# Patient Record
Sex: Female | Born: 1935 | Race: White | Hispanic: No | Smoking: Never smoker
Health system: Southern US, Community
[De-identification: ages and names within clinical notes are randomized; demographics above are authoritative.]

## PROBLEM LIST (undated history)

## (undated) DIAGNOSIS — K219 Gastro-esophageal reflux disease without esophagitis: Secondary | ICD-10-CM

## (undated) DIAGNOSIS — M1990 Unspecified osteoarthritis, unspecified site: Secondary | ICD-10-CM

## (undated) DIAGNOSIS — R0600 Dyspnea, unspecified: Secondary | ICD-10-CM

## (undated) DIAGNOSIS — Z950 Presence of cardiac pacemaker: Secondary | ICD-10-CM

## (undated) DIAGNOSIS — I10 Essential (primary) hypertension: Secondary | ICD-10-CM

## (undated) DIAGNOSIS — I080 Rheumatic disorders of both mitral and aortic valves: Secondary | ICD-10-CM

## (undated) DIAGNOSIS — E079 Disorder of thyroid, unspecified: Secondary | ICD-10-CM

## (undated) DIAGNOSIS — I4510 Unspecified right bundle-branch block: Secondary | ICD-10-CM

## (undated) HISTORY — DX: Disorder of thyroid, unspecified: E07.9

## (undated) HISTORY — PX: TONSILLECTOMY AND ADENOIDECTOMY: SUR1326

## (undated) HISTORY — PX: VAGINAL HYSTERECTOMY: SUR661

## (undated) HISTORY — DX: Gastro-esophageal reflux disease without esophagitis: K21.9

## (undated) HISTORY — DX: Essential (primary) hypertension: I10

## (undated) HISTORY — PX: KNEE SURGERY: SHX244

## (undated) HISTORY — PX: OTHER SURGICAL HISTORY: SHX169

## (undated) HISTORY — DX: Rheumatic disorders of both mitral and aortic valves: I08.0

## (undated) HISTORY — PX: EYE SURGERY: SHX253

## (undated) HISTORY — DX: Unspecified right bundle-branch block: I45.10

## (undated) HISTORY — DX: Unspecified osteoarthritis, unspecified site: M19.90

## (undated) HISTORY — DX: Dyspnea, unspecified: R06.00

---

## 1998-05-27 ENCOUNTER — Emergency Department (HOSPITAL_COMMUNITY): Admission: EM | Admit: 1998-05-27 | Discharge: 1998-05-27 | Payer: Self-pay | Admitting: Pediatrics

## 1999-08-19 ENCOUNTER — Other Ambulatory Visit: Admission: RE | Admit: 1999-08-19 | Discharge: 1999-08-19 | Payer: Self-pay | Admitting: Obstetrics and Gynecology

## 2000-08-24 ENCOUNTER — Other Ambulatory Visit: Admission: RE | Admit: 2000-08-24 | Discharge: 2000-08-24 | Payer: Self-pay | Admitting: Obstetrics and Gynecology

## 2000-12-18 ENCOUNTER — Encounter: Payer: Self-pay | Admitting: Internal Medicine

## 2000-12-18 ENCOUNTER — Encounter: Admission: RE | Admit: 2000-12-18 | Discharge: 2000-12-18 | Payer: Self-pay | Admitting: Internal Medicine

## 2001-08-27 ENCOUNTER — Other Ambulatory Visit: Admission: RE | Admit: 2001-08-27 | Discharge: 2001-08-27 | Payer: Self-pay | Admitting: Obstetrics and Gynecology

## 2004-01-18 ENCOUNTER — Ambulatory Visit (HOSPITAL_COMMUNITY): Admission: RE | Admit: 2004-01-18 | Discharge: 2004-01-18 | Payer: Self-pay | Admitting: *Deleted

## 2007-01-06 ENCOUNTER — Inpatient Hospital Stay (HOSPITAL_COMMUNITY): Admission: RE | Admit: 2007-01-06 | Discharge: 2007-01-09 | Payer: Self-pay | Admitting: Orthopedic Surgery

## 2008-07-28 ENCOUNTER — Encounter: Admission: RE | Admit: 2008-07-28 | Discharge: 2008-07-28 | Payer: Self-pay | Admitting: Orthopedic Surgery

## 2010-09-11 ENCOUNTER — Encounter
Admission: RE | Admit: 2010-09-11 | Discharge: 2010-09-11 | Payer: Self-pay | Source: Home / Self Care | Admitting: Orthopedic Surgery

## 2011-02-05 ENCOUNTER — Emergency Department (HOSPITAL_COMMUNITY): Payer: Medicare Other

## 2011-02-05 ENCOUNTER — Emergency Department (HOSPITAL_COMMUNITY)
Admission: EM | Admit: 2011-02-05 | Discharge: 2011-02-05 | Disposition: A | Discharge status: Home / Self Care | Payer: Medicare Other | Attending: Emergency Medicine | Admitting: Emergency Medicine

## 2011-02-05 DIAGNOSIS — S0083XA Contusion of other part of head, initial encounter: Secondary | ICD-10-CM | POA: Insufficient documentation

## 2011-02-05 DIAGNOSIS — M899 Disorder of bone, unspecified: Secondary | ICD-10-CM | POA: Insufficient documentation

## 2011-02-05 DIAGNOSIS — S0003XA Contusion of scalp, initial encounter: Secondary | ICD-10-CM | POA: Insufficient documentation

## 2011-02-05 DIAGNOSIS — W19XXXA Unspecified fall, initial encounter: Secondary | ICD-10-CM | POA: Insufficient documentation

## 2011-02-05 DIAGNOSIS — Y92009 Unspecified place in unspecified non-institutional (private) residence as the place of occurrence of the external cause: Secondary | ICD-10-CM | POA: Insufficient documentation

## 2011-02-05 DIAGNOSIS — I10 Essential (primary) hypertension: Secondary | ICD-10-CM | POA: Insufficient documentation

## 2011-02-05 DIAGNOSIS — IMO0002 Reserved for concepts with insufficient information to code with codable children: Secondary | ICD-10-CM | POA: Insufficient documentation

## 2011-02-05 DIAGNOSIS — R221 Localized swelling, mass and lump, neck: Secondary | ICD-10-CM | POA: Insufficient documentation

## 2011-02-05 DIAGNOSIS — R220 Localized swelling, mass and lump, head: Secondary | ICD-10-CM | POA: Insufficient documentation

## 2011-02-05 DIAGNOSIS — S62109A Fracture of unspecified carpal bone, unspecified wrist, initial encounter for closed fracture: Secondary | ICD-10-CM | POA: Insufficient documentation

## 2011-02-05 DIAGNOSIS — E7800 Pure hypercholesterolemia, unspecified: Secondary | ICD-10-CM | POA: Insufficient documentation

## 2011-02-21 NOTE — Op Note (Signed)
NAME:  [name], [name]                       ACCOUNT NO.:  [id number]   MEDICAL RECORD NO.:  [id number]                   PATIENT TYPE:  AMB   LOCATION:  ENDO                                 FACILITY:  [facility]   PHYSICIAN:  Georgiana Spinner, M.D.                 DATE OF BIRTH:  Dec 29, 1930   DATE OF PROCEDURE:  01/18/2004  DATE OF DISCHARGE:                                 OPERATIVE REPORT   PROCEDURE:  Colonoscopy.   INDICATIONS FOR PROCEDURE:  Rectal bleeding.   ANESTHESIA:  Demerol 50, Versed 5 mg.   PROCEDURE:  With the patient mildly sedated in the left lateral decubitus  position, the Olympus videoscopic colonoscope was inserted in the rectum and  passed under direct vision to the cecum identified by the ileocecal valve  and appendiceal orifice.  Of note, the sigmoid colon was markedly  hypertrophied and tortuous.  From this point, the colonoscope was slowly  withdrawn taking circumferential views of the colonic mucosa stopping only  in the rectum which appeared normal on direct and showed hemorrhoids on  retroflex view.  The endoscope was withdrawn.  The patient's vital signs and  pulse oximeter remained stable.  The patient tolerated the procedure well  without apparent complications.   FINDINGS:  Internal hemorrhoids, diverticulosis of the sigmoid colon,  otherwise, unremarkable exam.   PLAN:  Have the follow up with me as needed.                                               Georgiana Spinner, M.D.    GMO/MEDQ  D:  01/18/2004  T:  01/18/2004  Job:  [id number]

## 2011-02-21 NOTE — Discharge Summary (Signed)
[name], [name]             ACCOUNT NO.:  [id number]   MEDICAL RECORD NO.:  [id number]          PATIENT TYPE:  INP   LOCATION:  5020                         FACILITY:  [facility]   PHYSICIAN:  Loreta Ave, M.D. DATE OF BIRTH:  12/11/1930   DATE OF ADMISSION:  01/06/2007  DATE OF DISCHARGE:  01/09/2007                               DISCHARGE SUMMARY   FINAL DIAGNOSES:  1. Status post right total knee replacement for end-stage degenerative      joint disease.  2. Hypertension.  3. Hypothyroidism.  4. Hyperlipidemia.  5. Gastroesophageal reflux disease.   HISTORY OF PRESENT ILLNESS:  A 77 year old female with a history of end-  stage DJD, right knee, and chronic pain who presented to our office for  preoperative evaluation for total knee replacement.  She had progressive  worsening pain with failed response with conservative treatment.  Significant decrease in her daily activities due to the ongoing  complaint.   HOSPITAL COURSE:  On January 06, 2007, the patient was taken to the [facility] operating room and a right total knee replacement procedure  performed.  Surgeon Loreta Ave, M.D. and assistant [name],  P.A.C.  Anesthesia general.  No specimens.  Estimated blood loss  minimal.  Tourniquet time 90 minutes.  One Hemovac drain placed.  There  were no surgical or anesthesia complications, and the patient was  transferred to recovery in stable condition.   On January 07, 2007, the patient was doing well.  Temperature 99.1, pulse  103, respirations 20, blood pressure 113/58.  Hemoglobin 11.4,  hematocrit 33.3.  Sodium 133, potassium 3.9, chloride 102, CO2 of 23,  BUN 10, creatinine 0.82, glucose 105, INR 1.1.  Changed IV to normal  saline plus 20 KCl.  Pharmacy protocol Coumadin started.  PT and OT  consults.   On January 08, 2007, the patient was doing well with good pain control.  Progressed well with therapy.  Temperature 98.7, pulse 118, respirations  18, blood  pressure 111/52.  Hemoglobin 9.9, INR 2.2.  The wound looked  good.  Staples intact.  No drainage or signs of infection.  Hemovac  drain discontinued.  Calf nontender.  Neurovascularly intact.  Discontinued PCA and Foley.   On January 09, 2007, the patient was doing well.  Progressing great with  therapy.  They said she was ready to go home.  T-max 100.1.  Hemoglobin  9.4.  Hematocrit 21.6.  Wound looked good.  Staples intact.  No drainage  or signs of infection.   CONDITION:  Good and stable.   DISPOSITION:  Discharged home.   DISCHARGE MEDICATIONS:  1. Percocet 5/325, 1-2 tablets p.o. q.4-6h. p.r.n. for pain.  2. Robaxin 500 mg one tablet p.o. q.6h. p.r.n. for spasms.  3. Coumadin per pharmacy protocol.  4. Resume home medications - Omeprazole, lovastatin, lisinopril,      Levoxyl, calcium.   INSTRUCTIONS:  The patient will work with home health PT and OT to  improve knee range of motion, strengthening and ambulation.  She is  weightbearing as tolerated.  Daily dressing changes with 4 x 4  gauze and  tape.  Will be on Coumadin x4 weeks postoperatively for DVT prophylaxis.  Follow up in 2 weeks postoperatively for recheck.  Return sooner if  needed.      Genene Churn. Denton Meek.      Loreta Ave, M.D.  Electronically Signed    JMO/MEDQ  D:  03/08/2007  T:  03/08/2007  Job:  [id number]

## 2011-02-21 NOTE — Op Note (Signed)
NAMEBETTIE, [name]             ACCOUNT NO.:  [id number]   MEDICAL RECORD NO.:  [id number]          PATIENT TYPE:  INP   LOCATION:  2550                         FACILITY:  [facility]   PHYSICIAN:  Loreta Ave, M.D. DATE OF BIRTH:  1931-07-13   DATE OF PROCEDURE:  01/06/2007  DATE OF DISCHARGE:                               OPERATIVE REPORT   PREOPERATIVE DIAGNOSIS:  Endstage degenerative arthritis right knee,  valgus alignment and flexion contracture.   POSTOPERATIVE DIAGNOSIS:  Endstage degenerative arthritis right knee,  valgus alignment and flexion contracture.   PROCEDURE:  Right total knee replacement, [employer] triathlon  prosthesis.  Soft tissue balancing with lateral capsular release,  partial iliotibial band release and lateral retinacular release.  Cemented pegged posterior stabilized #5 femoral component.  Cemented #5  tibial component 9 mm polyethylene insert.  Cemented resurfacing 32 x 10  mm medial offset patellar component.   SURGEON:  Mckinley Jewel, M.D.   ASSISTANT:  Zonia Kief, PA present throughout the entire case.   ANESTHESIA:  General.   BLOOD LOSS:  Minimal.   TOURNIQUET TIME:  1 hour 30 minutes.   SPECIMENS:  None.   COMPLICATIONS:  None.   DRESSING:  Soft compressive with knee immobilizer.   PROCEDURE:  The patient was brought to the operating room and placed on  the operative table in the supine position.  After adequate anesthesia  had been obtained, right knee examined.  More than 5 degree flexion  contracture with further flexion 100.  Lateral patellofemoral tracking  tethering.  More than 10 degrees of valgus not very correctable.  Tourniquet applied.  Prepped and draped in the usual sterile fashion.  Exsanguinated with elevation of Esmarch tourniquet inflated to 350 mmHg.  Straight incision above the patella down to the tibial tubercle.  Medial  arthrotomy standard incision because of her degree of contracture.  Knee  exposed.  Intra-articular adhesions removed.  Distal femur exposed.  Remnants of menisci, periarticular spurs, cruciate ligament was excised.  Intramedullary guide placed.  Distal cut set at 5 degrees of valgus,  distal femur, resecting between 11 and 12 mm.  Size #5 component.  Jigs  put in place, definitive cuts made utilizing the epicondylar access to  insure adequate external rotation of the femoral component.  Trial with  the #5 component which fit well.  Trial removed.  Attention turned the  tibia.  Proximal resection 3 degree posterior slope cut resecting  sufficiently for 9 mm insert.  Size #5 component.  Resurfacing of the  patella removing the posterior 10 mm, drilled and sized for a 32-mm x 10-  mm component.  Trials put in place throughout.  A sequential lateral  release to get the balance that I want in the knee.  Released the  capsule off the tibia.  Did a release of the front half of the  iliotibial band.  Also lateral retinacular release.  When that was  complete and with appropriate bony cuts and the 9-mm insert full  extension, full flexion, excellent stability good patellofemoral  tracking throughout.  Tibia was marked for appropriate rotation and  reamed.  All trials removed.  Copious irrigation with pulse irrigating  device.  Cement prepared, placed on all components which were firmly  seated.  Excessive cement removed.  Polyethylene attached to tibia and  knee reduced.  Once the cement hardened, the knee was re-examined.  Full  extension, full flexion, good patellofemoral tracking, excellent  stability.  Hemovac placed and brought out through separate stab wound.  Arthrotomy closed with #1 Vicryl, subcutaneous tissue with Vicryl and  staples.  Margins were injected with Marcaine as was the knee.  Hemovac  clamped.  Sterile compressive dressing applied.  Tourniquet deflated and  removed.  Knee immobilizer applied.  Anesthesia reversed.  Brought to  recovery room.   Tolerated surgery well.  No complications.      Loreta Ave, M.D.  Electronically Signed     DFM/MEDQ  D:  01/06/2007  T:  01/06/2007  Job:  [id number]

## 2011-02-21 NOTE — Op Note (Signed)
NAME:  [name], [name]                       ACCOUNT NO.:  [id number]   MEDICAL RECORD NO.:  [id number]                   PATIENT TYPE:  AMB   LOCATION:  ENDO                                 FACILITY:  [facility]   PHYSICIAN:  Georgiana Spinner, M.D.                 DATE OF BIRTH:  1931/03/05   DATE OF PROCEDURE:  01/18/2004  DATE OF DISCHARGE:                                 OPERATIVE REPORT   PROCEDURE:  Upper endoscopy.   INDICATIONS FOR PROCEDURE:  GERD.   ANESTHESIA:  Demerol 40, Versed 4 mg.   PROCEDURE:  With the patient mildly sedated in the left lateral decubitus  position, the Olympus videoscopic endoscope was inserted in the mouth and  passed under direct vision through the esophagus which appeared normal.  There was no evidence of Barrett's.  We entered into the stomach.  The  fundus, body, antrum, duodenal bulb, and second portion of the duodenum were  visualized.  From this point, the endoscope was slowly withdrawn taking  circumferential views of the entire duodenal mucosa until the endoscope was  pulled back into the stomach and placed in retroflexion to view the stomach  from below.  The endoscope was then straightened and replaced in  retroflexion to note there was a hiatal hernia that was sliding, we  photographed this.  The endoscope was then straightened and withdrawn.  The  patient's vital signs and pulse oximeter remained stable.  The patient  tolerated the procedure well without apparent complications.   FINDINGS:  Hiatal hernia, otherwise unremarkable examination.   PLAN:  Proceed to colonoscopy.                                               Georgiana Spinner, M.D.    GMO/MEDQ  D:  01/18/2004  T:  01/18/2004  Job:  [id number]

## 2012-10-06 DIAGNOSIS — I4510 Unspecified right bundle-branch block: Secondary | ICD-10-CM

## 2012-10-06 HISTORY — DX: Unspecified right bundle-branch block: I45.10

## 2014-03-30 ENCOUNTER — Telehealth: Payer: Self-pay | Admitting: Interventional Cardiology

## 2014-03-30 NOTE — Telephone Encounter (Signed)
returned call to pt husband.adv him I am currently unable to get to to [place] [employer] to verify when and if pt needs a f/u with Dr.Smith.adv him I will continue to try today, and I will f/u with pt next early next wk regarding appt. Pt husband verbalized understanding

## 2014-03-30 NOTE — Telephone Encounter (Signed)
pt husband adv that at last o/v with Dr.Smith in 02/2013 he indicated that the pt is to f/u prn. pt hsuband sts pt is doing fine. appt not scheduled at this time. pt is followed closely by her pcp.pt husband adv if cardiac symptoms develop or pt pcp thinks a cardiac f/u is needed.Dr.Smith would be happy to see her. Pt husband thanked me and verbalized understanding

## 2014-03-30 NOTE — Telephone Encounter (Signed)
Follow up     Pt 's husb. Called to see if this pt needs to come in for a follow up. Husb is due in Oct.2015?

## 2014-05-20 ENCOUNTER — Encounter: Payer: Self-pay | Admitting: *Deleted

## 2016-01-08 DIAGNOSIS — M25552 Pain in left hip: Secondary | ICD-10-CM | POA: Diagnosis not present

## 2016-01-08 DIAGNOSIS — M25561 Pain in right knee: Secondary | ICD-10-CM | POA: Diagnosis not present

## 2016-02-11 DIAGNOSIS — E039 Hypothyroidism, unspecified: Secondary | ICD-10-CM | POA: Diagnosis not present

## 2016-02-11 DIAGNOSIS — N390 Urinary tract infection, site not specified: Secondary | ICD-10-CM | POA: Diagnosis not present

## 2016-02-11 DIAGNOSIS — I10 Essential (primary) hypertension: Secondary | ICD-10-CM | POA: Diagnosis not present

## 2016-02-18 DIAGNOSIS — E7800 Pure hypercholesterolemia, unspecified: Secondary | ICD-10-CM | POA: Diagnosis not present

## 2016-02-18 DIAGNOSIS — I340 Nonrheumatic mitral (valve) insufficiency: Secondary | ICD-10-CM | POA: Diagnosis not present

## 2016-02-18 DIAGNOSIS — I10 Essential (primary) hypertension: Secondary | ICD-10-CM | POA: Diagnosis not present

## 2016-02-18 DIAGNOSIS — E039 Hypothyroidism, unspecified: Secondary | ICD-10-CM | POA: Diagnosis not present

## 2016-02-19 ENCOUNTER — Other Ambulatory Visit (HOSPITAL_COMMUNITY): Payer: Self-pay | Admitting: Internal Medicine

## 2016-02-19 DIAGNOSIS — I340 Nonrheumatic mitral (valve) insufficiency: Secondary | ICD-10-CM

## 2016-03-04 ENCOUNTER — Other Ambulatory Visit: Payer: Self-pay

## 2016-03-04 ENCOUNTER — Ambulatory Visit (HOSPITAL_COMMUNITY): Payer: PPO | Attending: Cardiovascular Disease

## 2016-03-04 DIAGNOSIS — Z8249 Family history of ischemic heart disease and other diseases of the circulatory system: Secondary | ICD-10-CM | POA: Insufficient documentation

## 2016-03-04 DIAGNOSIS — I119 Hypertensive heart disease without heart failure: Secondary | ICD-10-CM | POA: Diagnosis not present

## 2016-03-04 DIAGNOSIS — I340 Nonrheumatic mitral (valve) insufficiency: Secondary | ICD-10-CM | POA: Insufficient documentation

## 2016-03-04 DIAGNOSIS — I351 Nonrheumatic aortic (valve) insufficiency: Secondary | ICD-10-CM | POA: Diagnosis not present

## 2016-03-17 DIAGNOSIS — Z1231 Encounter for screening mammogram for malignant neoplasm of breast: Secondary | ICD-10-CM | POA: Diagnosis not present

## 2016-06-10 DIAGNOSIS — M7062 Trochanteric bursitis, left hip: Secondary | ICD-10-CM | POA: Diagnosis not present

## 2016-06-24 DIAGNOSIS — E039 Hypothyroidism, unspecified: Secondary | ICD-10-CM | POA: Diagnosis not present

## 2016-06-24 DIAGNOSIS — E7800 Pure hypercholesterolemia, unspecified: Secondary | ICD-10-CM | POA: Diagnosis not present

## 2016-06-24 DIAGNOSIS — I10 Essential (primary) hypertension: Secondary | ICD-10-CM | POA: Diagnosis not present

## 2016-06-24 DIAGNOSIS — Z0000 Encounter for general adult medical examination without abnormal findings: Secondary | ICD-10-CM | POA: Diagnosis not present

## 2016-06-24 DIAGNOSIS — L723 Sebaceous cyst: Secondary | ICD-10-CM | POA: Diagnosis not present

## 2016-07-07 DIAGNOSIS — K429 Umbilical hernia without obstruction or gangrene: Secondary | ICD-10-CM | POA: Diagnosis not present

## 2016-07-07 DIAGNOSIS — L723 Sebaceous cyst: Secondary | ICD-10-CM | POA: Diagnosis not present

## 2016-07-07 DIAGNOSIS — L089 Local infection of the skin and subcutaneous tissue, unspecified: Secondary | ICD-10-CM | POA: Diagnosis not present

## 2016-07-18 DIAGNOSIS — Z23 Encounter for immunization: Secondary | ICD-10-CM | POA: Diagnosis not present

## 2016-08-13 DIAGNOSIS — I10 Essential (primary) hypertension: Secondary | ICD-10-CM | POA: Diagnosis not present

## 2016-08-13 DIAGNOSIS — E039 Hypothyroidism, unspecified: Secondary | ICD-10-CM | POA: Diagnosis not present

## 2016-09-03 ENCOUNTER — Other Ambulatory Visit (HOSPITAL_COMMUNITY): Payer: Self-pay | Admitting: Respiratory Therapy

## 2016-09-03 DIAGNOSIS — R05 Cough: Secondary | ICD-10-CM | POA: Diagnosis not present

## 2016-09-03 DIAGNOSIS — I10 Essential (primary) hypertension: Secondary | ICD-10-CM | POA: Diagnosis not present

## 2016-09-03 DIAGNOSIS — R053 Chronic cough: Secondary | ICD-10-CM

## 2016-09-03 DIAGNOSIS — E039 Hypothyroidism, unspecified: Secondary | ICD-10-CM | POA: Diagnosis not present

## 2016-09-03 DIAGNOSIS — F419 Anxiety disorder, unspecified: Secondary | ICD-10-CM | POA: Diagnosis not present

## 2016-09-12 ENCOUNTER — Encounter (HOSPITAL_COMMUNITY): Payer: Self-pay

## 2016-10-06 HISTORY — PX: CATARACT EXTRACTION, BILATERAL: SHX1313

## 2017-01-13 DIAGNOSIS — H5203 Hypermetropia, bilateral: Secondary | ICD-10-CM | POA: Diagnosis not present

## 2017-01-13 DIAGNOSIS — H2513 Age-related nuclear cataract, bilateral: Secondary | ICD-10-CM | POA: Diagnosis not present

## 2017-01-16 DIAGNOSIS — M545 Low back pain: Secondary | ICD-10-CM | POA: Diagnosis not present

## 2017-01-16 DIAGNOSIS — M7061 Trochanteric bursitis, right hip: Secondary | ICD-10-CM | POA: Diagnosis not present

## 2017-02-25 DIAGNOSIS — R682 Dry mouth, unspecified: Secondary | ICD-10-CM | POA: Diagnosis not present

## 2017-02-25 DIAGNOSIS — E7800 Pure hypercholesterolemia, unspecified: Secondary | ICD-10-CM | POA: Diagnosis not present

## 2017-02-25 DIAGNOSIS — I10 Essential (primary) hypertension: Secondary | ICD-10-CM | POA: Diagnosis not present

## 2017-04-03 DIAGNOSIS — B370 Candidal stomatitis: Secondary | ICD-10-CM | POA: Diagnosis not present

## 2017-04-03 DIAGNOSIS — F419 Anxiety disorder, unspecified: Secondary | ICD-10-CM | POA: Diagnosis not present

## 2017-04-10 DIAGNOSIS — N898 Other specified noninflammatory disorders of vagina: Secondary | ICD-10-CM | POA: Diagnosis not present

## 2017-04-10 DIAGNOSIS — Z01419 Encounter for gynecological examination (general) (routine) without abnormal findings: Secondary | ICD-10-CM | POA: Diagnosis not present

## 2017-04-10 DIAGNOSIS — N909 Noninflammatory disorder of vulva and perineum, unspecified: Secondary | ICD-10-CM | POA: Diagnosis not present

## 2017-04-10 DIAGNOSIS — Z1231 Encounter for screening mammogram for malignant neoplasm of breast: Secondary | ICD-10-CM | POA: Diagnosis not present

## 2017-04-10 DIAGNOSIS — Z6834 Body mass index (BMI) 34.0-34.9, adult: Secondary | ICD-10-CM | POA: Diagnosis not present

## 2017-07-17 DIAGNOSIS — Z23 Encounter for immunization: Secondary | ICD-10-CM | POA: Diagnosis not present

## 2017-08-26 DIAGNOSIS — I10 Essential (primary) hypertension: Secondary | ICD-10-CM | POA: Diagnosis not present

## 2017-08-26 DIAGNOSIS — E7800 Pure hypercholesterolemia, unspecified: Secondary | ICD-10-CM | POA: Diagnosis not present

## 2017-08-26 DIAGNOSIS — E039 Hypothyroidism, unspecified: Secondary | ICD-10-CM | POA: Diagnosis not present

## 2017-09-04 DIAGNOSIS — F419 Anxiety disorder, unspecified: Secondary | ICD-10-CM | POA: Diagnosis not present

## 2017-09-04 DIAGNOSIS — R05 Cough: Secondary | ICD-10-CM | POA: Diagnosis not present

## 2017-09-04 DIAGNOSIS — E039 Hypothyroidism, unspecified: Secondary | ICD-10-CM | POA: Diagnosis not present

## 2017-09-04 DIAGNOSIS — S299XXA Unspecified injury of thorax, initial encounter: Secondary | ICD-10-CM | POA: Diagnosis not present

## 2017-09-04 DIAGNOSIS — I10 Essential (primary) hypertension: Secondary | ICD-10-CM | POA: Diagnosis not present

## 2017-09-04 DIAGNOSIS — Z79899 Other long term (current) drug therapy: Secondary | ICD-10-CM | POA: Diagnosis not present

## 2017-12-09 DIAGNOSIS — E669 Obesity, unspecified: Secondary | ICD-10-CM | POA: Diagnosis not present

## 2017-12-09 DIAGNOSIS — K08109 Complete loss of teeth, unspecified cause, unspecified class: Secondary | ICD-10-CM | POA: Diagnosis not present

## 2017-12-09 DIAGNOSIS — E785 Hyperlipidemia, unspecified: Secondary | ICD-10-CM | POA: Diagnosis not present

## 2017-12-09 DIAGNOSIS — K219 Gastro-esophageal reflux disease without esophagitis: Secondary | ICD-10-CM | POA: Diagnosis not present

## 2017-12-09 DIAGNOSIS — E039 Hypothyroidism, unspecified: Secondary | ICD-10-CM | POA: Diagnosis not present

## 2017-12-09 DIAGNOSIS — H547 Unspecified visual loss: Secondary | ICD-10-CM | POA: Diagnosis not present

## 2017-12-09 DIAGNOSIS — I10 Essential (primary) hypertension: Secondary | ICD-10-CM | POA: Diagnosis not present

## 2017-12-09 DIAGNOSIS — R69 Illness, unspecified: Secondary | ICD-10-CM | POA: Diagnosis not present

## 2017-12-09 DIAGNOSIS — J309 Allergic rhinitis, unspecified: Secondary | ICD-10-CM | POA: Diagnosis not present

## 2017-12-15 DIAGNOSIS — R69 Illness, unspecified: Secondary | ICD-10-CM | POA: Diagnosis not present

## 2018-01-14 DIAGNOSIS — H2513 Age-related nuclear cataract, bilateral: Secondary | ICD-10-CM | POA: Diagnosis not present

## 2018-01-14 DIAGNOSIS — H5203 Hypermetropia, bilateral: Secondary | ICD-10-CM | POA: Diagnosis not present

## 2018-01-31 ENCOUNTER — Emergency Department (HOSPITAL_COMMUNITY)
Admission: EM | Admit: 2018-01-31 | Discharge: 2018-01-31 | Disposition: A | Discharge status: Home / Self Care | Payer: Medicare HMO | Attending: Emergency Medicine | Admitting: Emergency Medicine

## 2018-01-31 ENCOUNTER — Encounter (HOSPITAL_COMMUNITY): Payer: Self-pay | Admitting: Emergency Medicine

## 2018-01-31 ENCOUNTER — Emergency Department (HOSPITAL_COMMUNITY): Payer: Medicare HMO

## 2018-01-31 DIAGNOSIS — S52501A Unspecified fracture of the lower end of right radius, initial encounter for closed fracture: Secondary | ICD-10-CM | POA: Diagnosis not present

## 2018-01-31 DIAGNOSIS — W19XXXA Unspecified fall, initial encounter: Secondary | ICD-10-CM | POA: Diagnosis not present

## 2018-01-31 DIAGNOSIS — I10 Essential (primary) hypertension: Secondary | ICD-10-CM | POA: Insufficient documentation

## 2018-01-31 DIAGNOSIS — Y939 Activity, unspecified: Secondary | ICD-10-CM | POA: Diagnosis not present

## 2018-01-31 DIAGNOSIS — Y999 Unspecified external cause status: Secondary | ICD-10-CM | POA: Diagnosis not present

## 2018-01-31 DIAGNOSIS — Z7982 Long term (current) use of aspirin: Secondary | ICD-10-CM | POA: Insufficient documentation

## 2018-01-31 DIAGNOSIS — Y92481 Parking lot as the place of occurrence of the external cause: Secondary | ICD-10-CM | POA: Insufficient documentation

## 2018-01-31 DIAGNOSIS — Z79899 Other long term (current) drug therapy: Secondary | ICD-10-CM | POA: Diagnosis not present

## 2018-01-31 DIAGNOSIS — S6991XA Unspecified injury of right wrist, hand and finger(s), initial encounter: Secondary | ICD-10-CM | POA: Diagnosis present

## 2018-01-31 DIAGNOSIS — E039 Hypothyroidism, unspecified: Secondary | ICD-10-CM | POA: Diagnosis not present

## 2018-01-31 MED ORDER — TRAMADOL HCL 50 MG PO TABS: 50.0000 mg | ORAL_TABLET | Freq: Four times a day (QID) | ORAL | 0 refills | Status: DC | PRN

## 2018-01-31 NOTE — ED Notes (Signed)
Ortho called to apply wrist splint.

## 2018-01-31 NOTE — ED Triage Notes (Signed)
Pt reports she fell [place] parking [place] looking [place] straw[place]. Hit her head on car, c/o right wrist pain. Denies LOC. Takes ASA daily.

## 2018-01-31 NOTE — ED Provider Notes (Signed)
[facility] DEPT Provider Note   CSN: [id number] Arrival date & time: 01/31/18  1304     History   Chief Complaint Chief Complaint  Patient presents with  . Fall  . Wrist Injury    HPI [name] is a 84 y.o. female.  Patient fell today out in the parking lot and hurt her right wrist.  She did not get knocked out or injure any other areas  The history is provided by the patient. No language interpreter was used.  Fall  This is a new problem. The current episode started 6 to 12 hours ago. The problem occurs rarely. The problem has been resolved. Pertinent negatives include no chest pain, no abdominal pain and no headaches. Exacerbated by: Movement of wrist. Nothing relieves the symptoms. She has tried nothing for the symptoms. The treatment provided no relief.    Past Medical History:  Diagnosis Date  . Dyspnea    diastolic dysfunction by echo 2012  . GERD (gastroesophageal reflux disease)   . Hypertension   . Mild mitral and aortic regurgitation   . Osteoarthritis   . Right bundle branch block 2014  . Thyroid disease     There are no active problems to display for this patient.   Past Surgical History:  Procedure Laterality Date  . arm surgery     following an accident  . KNEE SURGERY     Following an accident  . TONSILLECTOMY AND ADENOIDECTOMY    . VAGINAL HYSTERECTOMY       OB History   None      Home Medications    Prior to Admission medications   Medication Sig Start Date End Date Taking? Authorizing Provider  amLODipine (NORVASC) 2.5 MG tablet Take 2.5 mg by mouth daily.    [provider]  aspirin 81 MG tablet Take 81 mg by mouth daily.    [provider]  Calcium Carbonate-Vitamin D (CALCIUM 500 + D) 500-125 MG-UNIT TABS Take by mouth.    [provider]  levothyroxine (SYNTHROID, LEVOTHROID) 150 MCG tablet Take 150 mcg by mouth daily before breakfast.    [provider]    lovastatin (MEVACOR) 40 MG tablet Take 40 mg by mouth at bedtime.    [provider]  Omega-3 Fatty Acids (FISH OIL) 1200 MG CAPS Take by mouth.    [provider]  omeprazole (PRILOSEC) 20 MG capsule Take 20 mg by mouth daily.    [provider]  traMADol (ULTRAM) 50 MG tablet Take 1 tablet (50 mg total) by mouth every 6 (six) hours as needed. 01/31/18   Milton Ferguson, MD    Family History Family History  Problem Relation Age of Onset  . Heart attack Mother     Social History Social History   Tobacco Use  . Smoking status: Never Smoker  . Smokeless tobacco: Never Used  Substance Use Topics  . Alcohol use: No  . Drug use: No     Allergies   Patient has no known allergies.   Review of Systems Review of Systems  Constitutional: Negative for appetite change and fatigue.  HENT: Negative for congestion, ear discharge and sinus pressure.   Eyes: Negative for discharge.  Respiratory: Negative for cough.   Cardiovascular: Negative for chest pain.  Gastrointestinal: Negative for abdominal pain and diarrhea.  Genitourinary: Negative for frequency and hematuria.  Musculoskeletal: Negative for back pain.       Painful swollen right wrist  Skin:  Negative for rash.  Neurological: Negative for seizures and headaches.  Psychiatric/Behavioral: Negative for hallucinations.     Physical Exam Updated Vital Signs BP (!) 162/65 (BP Location: Right Arm)   Pulse 65   Temp 97.7 F (36.5 C) (Oral)   Resp 18   SpO2 100%   Physical Exam  Constitutional: She is oriented to person, place, and time. She appears well-developed.  HENT:  Head: Normocephalic.  Eyes: Conjunctivae are normal.  Neck: No tracheal deviation present.  Cardiovascular:  No murmur heard. Musculoskeletal: Normal range of motion.  Swollen tender right wrist.  Neurovascular exam normal  Neurological: She is oriented to person, place, and time.  Skin: Skin is warm.  Psychiatric: She has  a normal mood and affect.     ED Treatments / Results  Labs (all labs ordered are listed, but only abnormal results are displayed) Labs Reviewed - No data to display  EKG None  Radiology Dg Wrist Complete Right  Result Date: 01/31/2018 CLINICAL DATA:  Fall.  Injury to the right wrist with pain. EXAM: RIGHT WRIST - COMPLETE 3+ VIEW COMPARISON:  02/05/2011 FINDINGS: Transverse, comminuted and mildly impacted fracture of the distal radius. Fractures extend to the articular surface. There is dorsal angulation of the distal radial articular surface measuring approximately 12 degrees. There is an associated ulnar styloid fracture, without significant displacement. The joints are normally aligned. Bones are demineralized. There is surrounding soft tissue swelling. IMPRESSION: 1. Comminuted, mildly impacted fracture of the distal radius with dorsal angulation of the articular surface of 12 degrees. Associated nondisplaced ulnar styloid fracture. Electronically Signed   By: Lajean Manes M.D.   On: 01/31/2018 14:00    Procedures Procedures (including critical care time)  Medications Ordered in ED Medications - No data to display   Initial Impression / Assessment and Plan / ED Course  I have reviewed the triage vital signs and the nursing notes.  Pertinent labs & imaging results that were available during my care of the patient were reviewed by me and considered in my medical decision making (see chart for details).   Patient with closed nondisplaced fracture right wrist.  We will place a splint on her risk for Ultram for pain she is to follow-up with her orthopedic doctor, who is Dr. Percell Miller    Final Clinical Impressions(s) / ED Diagnoses   Final diagnoses:  Closed fracture of distal end of right radius, unspecified fracture morphology, initial encounter    ED Discharge Orders        Ordered    traMADol (ULTRAM) 50 MG tablet  Every 6 hours PRN     01/31/18 1624       Milton Ferguson, MD 01/31/18 1629

## 2018-01-31 NOTE — Discharge Instructions (Addendum)
Follow-up with the orthopedic surgeon this week.  Keep your arm elevated as much as possible.  Call Dr. Debroah Loop office tomorrow morning and tell them that you broke your wrist and need follow-up this week

## 2018-02-01 DIAGNOSIS — S52501A Unspecified fracture of the lower end of right radius, initial encounter for closed fracture: Secondary | ICD-10-CM | POA: Diagnosis not present

## 2018-02-09 DIAGNOSIS — S52501D Unspecified fracture of the lower end of right radius, subsequent encounter for closed fracture with routine healing: Secondary | ICD-10-CM | POA: Diagnosis not present

## 2018-02-09 DIAGNOSIS — M25511 Pain in right shoulder: Secondary | ICD-10-CM | POA: Diagnosis not present

## 2018-03-02 DIAGNOSIS — S52501D Unspecified fracture of the lower end of right radius, subsequent encounter for closed fracture with routine healing: Secondary | ICD-10-CM | POA: Diagnosis not present

## 2018-03-03 DIAGNOSIS — Z5181 Encounter for therapeutic drug level monitoring: Secondary | ICD-10-CM | POA: Diagnosis not present

## 2018-03-03 DIAGNOSIS — Z79899 Other long term (current) drug therapy: Secondary | ICD-10-CM | POA: Diagnosis not present

## 2018-03-03 DIAGNOSIS — E039 Hypothyroidism, unspecified: Secondary | ICD-10-CM | POA: Diagnosis not present

## 2018-03-03 DIAGNOSIS — N390 Urinary tract infection, site not specified: Secondary | ICD-10-CM | POA: Diagnosis not present

## 2018-03-03 DIAGNOSIS — I10 Essential (primary) hypertension: Secondary | ICD-10-CM | POA: Diagnosis not present

## 2018-03-10 DIAGNOSIS — S5290XA Unspecified fracture of unspecified forearm, initial encounter for closed fracture: Secondary | ICD-10-CM | POA: Diagnosis not present

## 2018-03-10 DIAGNOSIS — Z0000 Encounter for general adult medical examination without abnormal findings: Secondary | ICD-10-CM | POA: Diagnosis not present

## 2018-03-10 DIAGNOSIS — I10 Essential (primary) hypertension: Secondary | ICD-10-CM | POA: Diagnosis not present

## 2018-03-30 DIAGNOSIS — S52501D Unspecified fracture of the lower end of right radius, subsequent encounter for closed fracture with routine healing: Secondary | ICD-10-CM | POA: Diagnosis not present

## 2018-04-27 DIAGNOSIS — S52501D Unspecified fracture of the lower end of right radius, subsequent encounter for closed fracture with routine healing: Secondary | ICD-10-CM | POA: Diagnosis not present

## 2018-04-30 DIAGNOSIS — M6281 Muscle weakness (generalized): Secondary | ICD-10-CM | POA: Diagnosis not present

## 2018-04-30 DIAGNOSIS — M25631 Stiffness of right wrist, not elsewhere classified: Secondary | ICD-10-CM | POA: Diagnosis not present

## 2018-04-30 DIAGNOSIS — M25531 Pain in right wrist: Secondary | ICD-10-CM | POA: Diagnosis not present

## 2018-05-05 DIAGNOSIS — M25531 Pain in right wrist: Secondary | ICD-10-CM | POA: Diagnosis not present

## 2018-05-05 DIAGNOSIS — M25631 Stiffness of right wrist, not elsewhere classified: Secondary | ICD-10-CM | POA: Diagnosis not present

## 2018-05-05 DIAGNOSIS — M6281 Muscle weakness (generalized): Secondary | ICD-10-CM | POA: Diagnosis not present

## 2018-05-07 DIAGNOSIS — M25531 Pain in right wrist: Secondary | ICD-10-CM | POA: Diagnosis not present

## 2018-05-07 DIAGNOSIS — M6281 Muscle weakness (generalized): Secondary | ICD-10-CM | POA: Diagnosis not present

## 2018-05-07 DIAGNOSIS — M25631 Stiffness of right wrist, not elsewhere classified: Secondary | ICD-10-CM | POA: Diagnosis not present

## 2018-05-11 DIAGNOSIS — M25531 Pain in right wrist: Secondary | ICD-10-CM | POA: Diagnosis not present

## 2018-05-11 DIAGNOSIS — M25631 Stiffness of right wrist, not elsewhere classified: Secondary | ICD-10-CM | POA: Diagnosis not present

## 2018-05-11 DIAGNOSIS — M6281 Muscle weakness (generalized): Secondary | ICD-10-CM | POA: Diagnosis not present

## 2018-05-14 DIAGNOSIS — M25531 Pain in right wrist: Secondary | ICD-10-CM | POA: Diagnosis not present

## 2018-05-14 DIAGNOSIS — M6281 Muscle weakness (generalized): Secondary | ICD-10-CM | POA: Diagnosis not present

## 2018-05-14 DIAGNOSIS — M25631 Stiffness of right wrist, not elsewhere classified: Secondary | ICD-10-CM | POA: Diagnosis not present

## 2018-05-25 DIAGNOSIS — M25531 Pain in right wrist: Secondary | ICD-10-CM | POA: Diagnosis not present

## 2018-06-11 DIAGNOSIS — Z01419 Encounter for gynecological examination (general) (routine) without abnormal findings: Secondary | ICD-10-CM | POA: Diagnosis not present

## 2018-06-11 DIAGNOSIS — Z1231 Encounter for screening mammogram for malignant neoplasm of breast: Secondary | ICD-10-CM | POA: Diagnosis not present

## 2018-06-11 DIAGNOSIS — N909 Noninflammatory disorder of vulva and perineum, unspecified: Secondary | ICD-10-CM | POA: Diagnosis not present

## 2018-06-11 DIAGNOSIS — Z6834 Body mass index (BMI) 34.0-34.9, adult: Secondary | ICD-10-CM | POA: Diagnosis not present

## 2018-07-09 DIAGNOSIS — Z23 Encounter for immunization: Secondary | ICD-10-CM | POA: Diagnosis not present

## 2018-07-23 DIAGNOSIS — H2513 Age-related nuclear cataract, bilateral: Secondary | ICD-10-CM | POA: Diagnosis not present

## 2018-08-31 DIAGNOSIS — I10 Essential (primary) hypertension: Secondary | ICD-10-CM | POA: Diagnosis not present

## 2018-09-09 DIAGNOSIS — I10 Essential (primary) hypertension: Secondary | ICD-10-CM | POA: Diagnosis not present

## 2018-09-09 DIAGNOSIS — E039 Hypothyroidism, unspecified: Secondary | ICD-10-CM | POA: Diagnosis not present

## 2018-09-09 DIAGNOSIS — R69 Illness, unspecified: Secondary | ICD-10-CM | POA: Diagnosis not present

## 2018-09-09 DIAGNOSIS — Z79899 Other long term (current) drug therapy: Secondary | ICD-10-CM | POA: Diagnosis not present

## 2019-02-21 DIAGNOSIS — R69 Illness, unspecified: Secondary | ICD-10-CM | POA: Diagnosis not present

## 2019-03-08 DIAGNOSIS — I10 Essential (primary) hypertension: Secondary | ICD-10-CM | POA: Diagnosis not present

## 2019-03-08 DIAGNOSIS — N390 Urinary tract infection, site not specified: Secondary | ICD-10-CM | POA: Diagnosis not present

## 2019-03-08 DIAGNOSIS — E039 Hypothyroidism, unspecified: Secondary | ICD-10-CM | POA: Diagnosis not present

## 2019-03-15 DIAGNOSIS — Z23 Encounter for immunization: Secondary | ICD-10-CM | POA: Diagnosis not present

## 2019-03-15 DIAGNOSIS — E039 Hypothyroidism, unspecified: Secondary | ICD-10-CM | POA: Diagnosis not present

## 2019-03-15 DIAGNOSIS — R682 Dry mouth, unspecified: Secondary | ICD-10-CM | POA: Diagnosis not present

## 2019-03-15 DIAGNOSIS — I10 Essential (primary) hypertension: Secondary | ICD-10-CM | POA: Diagnosis not present

## 2019-03-15 DIAGNOSIS — M1990 Unspecified osteoarthritis, unspecified site: Secondary | ICD-10-CM | POA: Diagnosis not present

## 2019-03-15 DIAGNOSIS — Z79899 Other long term (current) drug therapy: Secondary | ICD-10-CM | POA: Diagnosis not present

## 2019-03-15 DIAGNOSIS — E7800 Pure hypercholesterolemia, unspecified: Secondary | ICD-10-CM | POA: Diagnosis not present

## 2019-03-15 DIAGNOSIS — Z0000 Encounter for general adult medical examination without abnormal findings: Secondary | ICD-10-CM | POA: Diagnosis not present

## 2019-03-15 DIAGNOSIS — R69 Illness, unspecified: Secondary | ICD-10-CM | POA: Diagnosis not present

## 2019-03-15 DIAGNOSIS — L723 Sebaceous cyst: Secondary | ICD-10-CM | POA: Diagnosis not present

## 2019-03-28 DIAGNOSIS — R42 Dizziness and giddiness: Secondary | ICD-10-CM | POA: Diagnosis not present

## 2019-03-28 DIAGNOSIS — Z7189 Other specified counseling: Secondary | ICD-10-CM | POA: Diagnosis not present

## 2019-03-28 DIAGNOSIS — J309 Allergic rhinitis, unspecified: Secondary | ICD-10-CM | POA: Diagnosis not present

## 2019-07-08 DIAGNOSIS — Z23 Encounter for immunization: Secondary | ICD-10-CM | POA: Diagnosis not present

## 2019-07-25 DIAGNOSIS — H2513 Age-related nuclear cataract, bilateral: Secondary | ICD-10-CM | POA: Diagnosis not present

## 2019-07-25 DIAGNOSIS — H5203 Hypermetropia, bilateral: Secondary | ICD-10-CM | POA: Diagnosis not present

## 2019-09-07 DIAGNOSIS — E039 Hypothyroidism, unspecified: Secondary | ICD-10-CM | POA: Diagnosis not present

## 2019-09-07 DIAGNOSIS — I10 Essential (primary) hypertension: Secondary | ICD-10-CM | POA: Diagnosis not present

## 2019-09-07 DIAGNOSIS — Z79899 Other long term (current) drug therapy: Secondary | ICD-10-CM | POA: Diagnosis not present

## 2019-09-14 DIAGNOSIS — R7303 Prediabetes: Secondary | ICD-10-CM | POA: Diagnosis not present

## 2019-09-14 DIAGNOSIS — I10 Essential (primary) hypertension: Secondary | ICD-10-CM | POA: Diagnosis not present

## 2019-09-14 DIAGNOSIS — E7800 Pure hypercholesterolemia, unspecified: Secondary | ICD-10-CM | POA: Diagnosis not present

## 2019-09-14 DIAGNOSIS — E039 Hypothyroidism, unspecified: Secondary | ICD-10-CM | POA: Diagnosis not present

## 2019-11-05 ENCOUNTER — Ambulatory Visit: Payer: Self-pay

## 2019-11-10 ENCOUNTER — Ambulatory Visit: Payer: Medicare HMO | Attending: Internal Medicine

## 2019-11-10 DIAGNOSIS — Z23 Encounter for immunization: Secondary | ICD-10-CM | POA: Insufficient documentation

## 2019-11-10 NOTE — Progress Notes (Signed)
   Covid-19 Vaccination Clinic  Name:  [name]    MRN: [id number] DOB: 22-Sep-1931  11/10/2019  [name] was observed post Covid-19 immunization for 15 minutes without incidence. She was provided with Vaccine Information Sheet and instruction to access the V-Safe system.   [name] was instructed to call 911 with any severe reactions post vaccine: Marland Kitchen Difficulty breathing  . Swelling of your face and throat  . A fast heartbeat  . A bad rash all over your body  . Dizziness and weakness    Immunizations Administered    Name Date Dose VIS Date Route   Pfizer COVID-19 Vaccine 11/10/2019  3:27 PM 0.3 mL 09/16/2019 Intramuscular   Manufacturer: [place]   Lot: [id number]   [place]: [id number]

## 2019-12-06 ENCOUNTER — Ambulatory Visit: Payer: Medicare HMO | Attending: Internal Medicine

## 2019-12-06 DIAGNOSIS — Z23 Encounter for immunization: Secondary | ICD-10-CM | POA: Insufficient documentation

## 2019-12-06 NOTE — Progress Notes (Signed)
   Covid-19 Vaccination Clinic  Name:  [name]    MRN: [id number] DOB: 10-30-1930  12/06/2019  [name] was observed post Covid-19 immunization for 15 minutes without incident. She was provided with Vaccine Information Sheet and instruction to access the V-Safe system.   [name] was instructed to call 911 with any severe reactions post vaccine: Marland Kitchen Difficulty breathing  . Swelling of face and throat  . A fast heartbeat  . A bad rash all over body  . Dizziness and weakness   Immunizations Administered    Name Date Dose VIS Date Route   Pfizer COVID-19 Vaccine 12/06/2019 12:40 PM 0.3 mL 09/16/2019 Intramuscular   Manufacturer: [place]   Lot: [id number]   [place]: [id number]

## 2020-01-19 DIAGNOSIS — R69 Illness, unspecified: Secondary | ICD-10-CM | POA: Diagnosis not present

## 2020-03-14 DIAGNOSIS — E039 Hypothyroidism, unspecified: Secondary | ICD-10-CM | POA: Diagnosis not present

## 2020-03-14 DIAGNOSIS — I10 Essential (primary) hypertension: Secondary | ICD-10-CM | POA: Diagnosis not present

## 2020-03-14 DIAGNOSIS — R7303 Prediabetes: Secondary | ICD-10-CM | POA: Diagnosis not present

## 2020-03-23 DIAGNOSIS — I10 Essential (primary) hypertension: Secondary | ICD-10-CM | POA: Diagnosis not present

## 2020-03-23 DIAGNOSIS — E039 Hypothyroidism, unspecified: Secondary | ICD-10-CM | POA: Diagnosis not present

## 2020-03-23 DIAGNOSIS — E7800 Pure hypercholesterolemia, unspecified: Secondary | ICD-10-CM | POA: Diagnosis not present

## 2020-03-23 DIAGNOSIS — M1990 Unspecified osteoarthritis, unspecified site: Secondary | ICD-10-CM | POA: Diagnosis not present

## 2020-03-23 DIAGNOSIS — Z0000 Encounter for general adult medical examination without abnormal findings: Secondary | ICD-10-CM | POA: Diagnosis not present

## 2020-03-23 DIAGNOSIS — R7303 Prediabetes: Secondary | ICD-10-CM | POA: Diagnosis not present

## 2020-03-23 DIAGNOSIS — R69 Illness, unspecified: Secondary | ICD-10-CM | POA: Diagnosis not present

## 2020-07-06 DIAGNOSIS — Z23 Encounter for immunization: Secondary | ICD-10-CM | POA: Diagnosis not present

## 2020-07-26 DIAGNOSIS — H5203 Hypermetropia, bilateral: Secondary | ICD-10-CM | POA: Diagnosis not present

## 2020-07-26 DIAGNOSIS — H2513 Age-related nuclear cataract, bilateral: Secondary | ICD-10-CM | POA: Diagnosis not present

## 2020-08-06 DIAGNOSIS — Z1231 Encounter for screening mammogram for malignant neoplasm of breast: Secondary | ICD-10-CM | POA: Diagnosis not present

## 2020-09-05 DIAGNOSIS — E039 Hypothyroidism, unspecified: Secondary | ICD-10-CM | POA: Diagnosis not present

## 2020-09-05 DIAGNOSIS — I10 Essential (primary) hypertension: Secondary | ICD-10-CM | POA: Diagnosis not present

## 2020-09-05 DIAGNOSIS — R7303 Prediabetes: Secondary | ICD-10-CM | POA: Diagnosis not present

## 2020-09-05 DIAGNOSIS — E7800 Pure hypercholesterolemia, unspecified: Secondary | ICD-10-CM | POA: Diagnosis not present

## 2020-09-05 DIAGNOSIS — Z0000 Encounter for general adult medical examination without abnormal findings: Secondary | ICD-10-CM | POA: Diagnosis not present

## 2020-09-05 DIAGNOSIS — N390 Urinary tract infection, site not specified: Secondary | ICD-10-CM | POA: Diagnosis not present

## 2020-09-12 DIAGNOSIS — R7303 Prediabetes: Secondary | ICD-10-CM | POA: Diagnosis not present

## 2020-09-12 DIAGNOSIS — Z0000 Encounter for general adult medical examination without abnormal findings: Secondary | ICD-10-CM | POA: Diagnosis not present

## 2020-09-12 DIAGNOSIS — R682 Dry mouth, unspecified: Secondary | ICD-10-CM | POA: Diagnosis not present

## 2020-09-12 DIAGNOSIS — M1990 Unspecified osteoarthritis, unspecified site: Secondary | ICD-10-CM | POA: Diagnosis not present

## 2020-09-12 DIAGNOSIS — E7800 Pure hypercholesterolemia, unspecified: Secondary | ICD-10-CM | POA: Diagnosis not present

## 2020-09-12 DIAGNOSIS — E039 Hypothyroidism, unspecified: Secondary | ICD-10-CM | POA: Diagnosis not present

## 2020-09-12 DIAGNOSIS — I10 Essential (primary) hypertension: Secondary | ICD-10-CM | POA: Diagnosis not present

## 2020-09-12 DIAGNOSIS — R69 Illness, unspecified: Secondary | ICD-10-CM | POA: Diagnosis not present

## 2020-09-25 DIAGNOSIS — M545 Low back pain, unspecified: Secondary | ICD-10-CM | POA: Diagnosis not present

## 2020-10-17 DIAGNOSIS — H25811 Combined forms of age-related cataract, right eye: Secondary | ICD-10-CM | POA: Diagnosis not present

## 2020-10-17 DIAGNOSIS — H2511 Age-related nuclear cataract, right eye: Secondary | ICD-10-CM | POA: Diagnosis not present

## 2020-11-14 DIAGNOSIS — H25812 Combined forms of age-related cataract, left eye: Secondary | ICD-10-CM | POA: Diagnosis not present

## 2020-11-14 DIAGNOSIS — H2512 Age-related nuclear cataract, left eye: Secondary | ICD-10-CM | POA: Diagnosis not present

## 2020-12-31 DIAGNOSIS — H524 Presbyopia: Secondary | ICD-10-CM | POA: Diagnosis not present

## 2020-12-31 DIAGNOSIS — Z0100 Encounter for examination of eyes and vision without abnormal findings: Secondary | ICD-10-CM | POA: Diagnosis not present

## 2021-02-11 DIAGNOSIS — E669 Obesity, unspecified: Secondary | ICD-10-CM | POA: Diagnosis not present

## 2021-02-11 DIAGNOSIS — M1990 Unspecified osteoarthritis, unspecified site: Secondary | ICD-10-CM | POA: Diagnosis not present

## 2021-02-11 DIAGNOSIS — E785 Hyperlipidemia, unspecified: Secondary | ICD-10-CM | POA: Diagnosis not present

## 2021-02-11 DIAGNOSIS — Z6833 Body mass index (BMI) 33.0-33.9, adult: Secondary | ICD-10-CM | POA: Diagnosis not present

## 2021-02-11 DIAGNOSIS — Z7982 Long term (current) use of aspirin: Secondary | ICD-10-CM | POA: Diagnosis not present

## 2021-02-11 DIAGNOSIS — E039 Hypothyroidism, unspecified: Secondary | ICD-10-CM | POA: Diagnosis not present

## 2021-02-11 DIAGNOSIS — R69 Illness, unspecified: Secondary | ICD-10-CM | POA: Diagnosis not present

## 2021-02-11 DIAGNOSIS — K219 Gastro-esophageal reflux disease without esophagitis: Secondary | ICD-10-CM | POA: Diagnosis not present

## 2021-02-11 DIAGNOSIS — I10 Essential (primary) hypertension: Secondary | ICD-10-CM | POA: Diagnosis not present

## 2021-02-11 DIAGNOSIS — G8929 Other chronic pain: Secondary | ICD-10-CM | POA: Diagnosis not present

## 2021-03-18 DIAGNOSIS — Z0000 Encounter for general adult medical examination without abnormal findings: Secondary | ICD-10-CM | POA: Diagnosis not present

## 2021-03-26 DIAGNOSIS — Z0000 Encounter for general adult medical examination without abnormal findings: Secondary | ICD-10-CM | POA: Diagnosis not present

## 2021-03-26 DIAGNOSIS — E039 Hypothyroidism, unspecified: Secondary | ICD-10-CM | POA: Diagnosis not present

## 2021-03-26 DIAGNOSIS — R7303 Prediabetes: Secondary | ICD-10-CM | POA: Diagnosis not present

## 2021-03-26 DIAGNOSIS — L309 Dermatitis, unspecified: Secondary | ICD-10-CM | POA: Diagnosis not present

## 2021-03-26 DIAGNOSIS — M7989 Other specified soft tissue disorders: Secondary | ICD-10-CM | POA: Diagnosis not present

## 2021-03-26 DIAGNOSIS — E7800 Pure hypercholesterolemia, unspecified: Secondary | ICD-10-CM | POA: Diagnosis not present

## 2021-03-26 DIAGNOSIS — I10 Essential (primary) hypertension: Secondary | ICD-10-CM | POA: Diagnosis not present

## 2021-03-26 DIAGNOSIS — R7989 Other specified abnormal findings of blood chemistry: Secondary | ICD-10-CM | POA: Diagnosis not present

## 2021-04-04 DIAGNOSIS — M7989 Other specified soft tissue disorders: Secondary | ICD-10-CM | POA: Diagnosis not present

## 2021-06-04 DIAGNOSIS — R69 Illness, unspecified: Secondary | ICD-10-CM | POA: Diagnosis not present

## 2021-06-20 DIAGNOSIS — F419 Anxiety disorder, unspecified: Secondary | ICD-10-CM | POA: Diagnosis not present

## 2021-06-20 DIAGNOSIS — R69 Illness, unspecified: Secondary | ICD-10-CM | POA: Diagnosis not present

## 2021-07-01 DIAGNOSIS — H5203 Hypermetropia, bilateral: Secondary | ICD-10-CM | POA: Diagnosis not present

## 2021-07-01 DIAGNOSIS — H353132 Nonexudative age-related macular degeneration, bilateral, intermediate dry stage: Secondary | ICD-10-CM | POA: Diagnosis not present

## 2021-07-01 DIAGNOSIS — Z961 Presence of intraocular lens: Secondary | ICD-10-CM | POA: Diagnosis not present

## 2021-07-12 DIAGNOSIS — Z23 Encounter for immunization: Secondary | ICD-10-CM | POA: Diagnosis not present

## 2021-12-08 ENCOUNTER — Inpatient Hospital Stay (HOSPITAL_BASED_OUTPATIENT_CLINIC_OR_DEPARTMENT_OTHER)
Admission: EM | Admit: 2021-12-08 | Discharge: 2021-12-22 | DRG: 291 | Disposition: A | Discharge status: Home Health | Payer: Medicare HMO | Attending: Internal Medicine | Admitting: Internal Medicine

## 2021-12-08 ENCOUNTER — Other Ambulatory Visit: Payer: Self-pay

## 2021-12-08 ENCOUNTER — Encounter (HOSPITAL_BASED_OUTPATIENT_CLINIC_OR_DEPARTMENT_OTHER): Payer: Self-pay | Admitting: Emergency Medicine

## 2021-12-08 ENCOUNTER — Emergency Department (HOSPITAL_BASED_OUTPATIENT_CLINIC_OR_DEPARTMENT_OTHER): Payer: Medicare HMO

## 2021-12-08 DIAGNOSIS — E870 Hyperosmolality and hypernatremia: Secondary | ICD-10-CM | POA: Diagnosis not present

## 2021-12-08 DIAGNOSIS — I5023 Acute on chronic systolic (congestive) heart failure: Secondary | ICD-10-CM | POA: Diagnosis not present

## 2021-12-08 DIAGNOSIS — I340 Nonrheumatic mitral (valve) insufficiency: Secondary | ICD-10-CM | POA: Diagnosis not present

## 2021-12-08 DIAGNOSIS — I10 Essential (primary) hypertension: Secondary | ICD-10-CM | POA: Diagnosis present

## 2021-12-08 DIAGNOSIS — J189 Pneumonia, unspecified organism: Secondary | ICD-10-CM | POA: Diagnosis not present

## 2021-12-08 DIAGNOSIS — E039 Hypothyroidism, unspecified: Secondary | ICD-10-CM | POA: Diagnosis not present

## 2021-12-08 DIAGNOSIS — I5031 Acute diastolic (congestive) heart failure: Secondary | ICD-10-CM | POA: Diagnosis not present

## 2021-12-08 DIAGNOSIS — I351 Nonrheumatic aortic (valve) insufficiency: Secondary | ICD-10-CM | POA: Diagnosis not present

## 2021-12-08 DIAGNOSIS — F419 Anxiety disorder, unspecified: Secondary | ICD-10-CM | POA: Diagnosis present

## 2021-12-08 DIAGNOSIS — R0603 Acute respiratory distress: Secondary | ICD-10-CM | POA: Insufficient documentation

## 2021-12-08 DIAGNOSIS — Z79899 Other long term (current) drug therapy: Secondary | ICD-10-CM

## 2021-12-08 DIAGNOSIS — E222 Syndrome of inappropriate secretion of antidiuretic hormone: Secondary | ICD-10-CM | POA: Diagnosis present

## 2021-12-08 DIAGNOSIS — Z20822 Contact with and (suspected) exposure to covid-19: Secondary | ICD-10-CM | POA: Diagnosis present

## 2021-12-08 DIAGNOSIS — I5021 Acute systolic (congestive) heart failure: Secondary | ICD-10-CM | POA: Diagnosis not present

## 2021-12-08 DIAGNOSIS — K219 Gastro-esophageal reflux disease without esophagitis: Secondary | ICD-10-CM | POA: Diagnosis present

## 2021-12-08 DIAGNOSIS — Z7982 Long term (current) use of aspirin: Secondary | ICD-10-CM

## 2021-12-08 DIAGNOSIS — I4891 Unspecified atrial fibrillation: Principal | ICD-10-CM

## 2021-12-08 DIAGNOSIS — E861 Hypovolemia: Secondary | ICD-10-CM | POA: Diagnosis present

## 2021-12-08 DIAGNOSIS — I4819 Other persistent atrial fibrillation: Secondary | ICD-10-CM | POA: Diagnosis not present

## 2021-12-08 DIAGNOSIS — D649 Anemia, unspecified: Secondary | ICD-10-CM

## 2021-12-08 DIAGNOSIS — R0602 Shortness of breath: Secondary | ICD-10-CM | POA: Diagnosis not present

## 2021-12-08 DIAGNOSIS — J9601 Acute respiratory failure with hypoxia: Secondary | ICD-10-CM | POA: Diagnosis not present

## 2021-12-08 DIAGNOSIS — M1990 Unspecified osteoarthritis, unspecified site: Secondary | ICD-10-CM | POA: Diagnosis present

## 2021-12-08 DIAGNOSIS — D638 Anemia in other chronic diseases classified elsewhere: Secondary | ICD-10-CM | POA: Diagnosis not present

## 2021-12-08 DIAGNOSIS — I429 Cardiomyopathy, unspecified: Secondary | ICD-10-CM | POA: Diagnosis not present

## 2021-12-08 DIAGNOSIS — J90 Pleural effusion, not elsewhere classified: Secondary | ICD-10-CM | POA: Diagnosis not present

## 2021-12-08 DIAGNOSIS — Z79890 Hormone replacement therapy (postmenopausal): Secondary | ICD-10-CM | POA: Diagnosis not present

## 2021-12-08 DIAGNOSIS — I110 Hypertensive heart disease with heart failure: Principal | ICD-10-CM | POA: Diagnosis present

## 2021-12-08 DIAGNOSIS — E871 Hypo-osmolality and hyponatremia: Secondary | ICD-10-CM | POA: Diagnosis not present

## 2021-12-08 DIAGNOSIS — E669 Obesity, unspecified: Secondary | ICD-10-CM | POA: Diagnosis present

## 2021-12-08 DIAGNOSIS — R6510 Systemic inflammatory response syndrome (SIRS) of non-infectious origin without acute organ dysfunction: Secondary | ICD-10-CM

## 2021-12-08 DIAGNOSIS — I5041 Acute combined systolic (congestive) and diastolic (congestive) heart failure: Secondary | ICD-10-CM

## 2021-12-08 DIAGNOSIS — Z6831 Body mass index (BMI) 31.0-31.9, adult: Secondary | ICD-10-CM | POA: Diagnosis not present

## 2021-12-08 DIAGNOSIS — E785 Hyperlipidemia, unspecified: Secondary | ICD-10-CM | POA: Diagnosis present

## 2021-12-08 DIAGNOSIS — E876 Hypokalemia: Secondary | ICD-10-CM | POA: Diagnosis present

## 2021-12-08 DIAGNOSIS — J9811 Atelectasis: Secondary | ICD-10-CM | POA: Diagnosis present

## 2021-12-08 DIAGNOSIS — Z90710 Acquired absence of both cervix and uterus: Secondary | ICD-10-CM

## 2021-12-08 DIAGNOSIS — I509 Heart failure, unspecified: Secondary | ICD-10-CM

## 2021-12-08 DIAGNOSIS — R0600 Dyspnea, unspecified: Secondary | ICD-10-CM | POA: Diagnosis not present

## 2021-12-08 DIAGNOSIS — I517 Cardiomegaly: Secondary | ICD-10-CM | POA: Diagnosis not present

## 2021-12-08 DIAGNOSIS — Z8249 Family history of ischemic heart disease and other diseases of the circulatory system: Secondary | ICD-10-CM

## 2021-12-08 DIAGNOSIS — R001 Bradycardia, unspecified: Secondary | ICD-10-CM | POA: Diagnosis not present

## 2021-12-08 LAB — CBC WITH DIFFERENTIAL/PLATELET
Abs Immature Granulocytes: 0.03 10*3/uL (ref 0.00–0.07)
Basophils Absolute: 0.1 10*3/uL (ref 0.0–0.1)
Basophils Relative: 1 %
Eosinophils Absolute: 0.2 10*3/uL (ref 0.0–0.5)
Eosinophils Relative: 2 %
HCT: 36.5 % (ref 36.0–46.0)
Hemoglobin: 11.9 g/dL — ABNORMAL LOW (ref 12.0–15.0)
Immature Granulocytes: 0 %
Lymphocytes Relative: 14 %
Lymphs Abs: 1.2 10*3/uL (ref 0.7–4.0)
MCH: 27 pg (ref 26.0–34.0)
MCHC: 32.6 g/dL (ref 30.0–36.0)
MCV: 82.8 fL (ref 80.0–100.0)
Monocytes Absolute: 0.7 10*3/uL (ref 0.1–1.0)
Monocytes Relative: 8 %
Neutro Abs: 6.6 10*3/uL (ref 1.7–7.7)
Neutrophils Relative %: 75 %
Platelets: 323 10*3/uL (ref 150–400)
RBC: 4.41 MIL/uL (ref 3.87–5.11)
RDW: 13.7 % (ref 11.5–15.5)
WBC: 8.7 10*3/uL (ref 4.0–10.5)
nRBC: 0 % (ref 0.0–0.2)

## 2021-12-08 LAB — COMPREHENSIVE METABOLIC PANEL
ALT: 15 U/L (ref 0–44)
AST: 18 U/L (ref 15–41)
Albumin: 4 g/dL (ref 3.5–5.0)
Alkaline Phosphatase: 81 U/L (ref 38–126)
Anion gap: 9 (ref 5–15)
BUN: 13 mg/dL (ref 8–23)
CO2: 23 mmol/L (ref 22–32)
Calcium: 8.9 mg/dL (ref 8.9–10.3)
Chloride: 93 mmol/L — ABNORMAL LOW (ref 98–111)
Creatinine, Ser: 0.7 mg/dL (ref 0.44–1.00)
GFR, Estimated: >60 mL/min (ref >60)
Glucose, Bld: 102 mg/dL — ABNORMAL HIGH (ref 70–99)
Potassium: 4.1 mmol/L (ref 3.5–5.1)
Sodium: 125 mmol/L — ABNORMAL LOW (ref 135–145)
Total Bilirubin: 0.5 mg/dL (ref 0.3–1.2)
Total Protein: 6.8 g/dL (ref 6.5–8.1)

## 2021-12-08 LAB — RESP PANEL BY RT-PCR (FLU A&B, COVID) ARPGX2
Influenza A by PCR: NEGATIVE
Influenza B by PCR: NEGATIVE
SARS Coronavirus 2 by RT PCR: NEGATIVE

## 2021-12-08 LAB — PROCALCITONIN: Procalcitonin: <0.1 ng/mL

## 2021-12-08 LAB — HEMOGLOBIN A1C
Hgb A1c MFr Bld: 4.9 % (ref 4.8–5.6)
Mean Plasma Glucose: 93.93 mg/dL

## 2021-12-08 LAB — LACTIC ACID, PLASMA: Lactic Acid, Venous: 1.1 mmol/L (ref 0.5–1.9)

## 2021-12-08 LAB — MRSA NEXT GEN BY PCR, NASAL: MRSA by PCR Next Gen: NOT DETECTED

## 2021-12-08 LAB — MAGNESIUM: Magnesium: 1.9 mg/dL (ref 1.7–2.4)

## 2021-12-08 LAB — TSH: TSH: 7.19 u[IU]/mL — ABNORMAL HIGH (ref 0.350–4.500)

## 2021-12-08 LAB — BRAIN NATRIURETIC PEPTIDE: B Natriuretic Peptide: 453.8 pg/mL — ABNORMAL HIGH (ref 0.0–100.0)

## 2021-12-08 LAB — HEPARIN LEVEL (UNFRACTIONATED): Heparin Unfractionated: 0.38 IU/mL (ref 0.30–0.70)

## 2021-12-08 LAB — GLUCOSE, CAPILLARY: Glucose-Capillary: 119 mg/dL — ABNORMAL HIGH (ref 70–99)

## 2021-12-08 LAB — D-DIMER, QUANTITATIVE: D-Dimer, Quant: 0.8 ug/mL-FEU — ABNORMAL HIGH (ref 0.00–0.50)

## 2021-12-08 LAB — TROPONIN I (HIGH SENSITIVITY): Troponin I (High Sensitivity): 6 ng/L (ref <18)

## 2021-12-08 MED ORDER — LEVOTHYROXINE SODIUM 75 MCG PO TABS: 150.0000 ug | ORAL_TABLET | ORAL | Status: DC

## 2021-12-08 MED ORDER — FUROSEMIDE 10 MG/ML IJ SOLN
20.0000 mg | Freq: Once | INTRAMUSCULAR | Status: AC
  Administered 2021-12-08: 20 mg via INTRAVENOUS
  Filled 2021-12-08: qty 2

## 2021-12-08 MED ORDER — METOPROLOL TARTRATE 5 MG/5ML IV SOLN
5.0000 mg | Freq: Once | INTRAVENOUS | Status: AC
  Administered 2021-12-08: 5 mg via INTRAVENOUS
  Filled 2021-12-08: qty 5

## 2021-12-08 MED ORDER — SODIUM CHLORIDE 0.9 % IV SOLN
1.0000 g | Freq: Once | INTRAVENOUS | Status: AC
  Administered 2021-12-08: 1 g via INTRAVENOUS
  Filled 2021-12-08: qty 10

## 2021-12-08 MED ORDER — FUROSEMIDE 10 MG/ML IJ SOLN
20.0000 mg | Freq: Two times a day (BID) | INTRAMUSCULAR | Status: DC
  Administered 2021-12-08: 20 mg via INTRAVENOUS
  Filled 2021-12-08: qty 2

## 2021-12-08 MED ORDER — ALBUTEROL SULFATE (2.5 MG/3ML) 0.083% IN NEBU
2.5000 mg | INHALATION_SOLUTION | RESPIRATORY_TRACT | Status: DC | PRN
  Administered 2021-12-09: 2.5 mg via RESPIRATORY_TRACT
  Filled 2021-12-08: qty 3

## 2021-12-08 MED ORDER — FLUTICASONE PROPIONATE 50 MCG/ACT NA SUSP
1.0000 | Freq: Every day | NASAL | Status: DC | PRN
  Filled 2021-12-08: qty 16

## 2021-12-08 MED ORDER — LEVOTHYROXINE SODIUM 75 MCG PO TABS
225.0000 ug | ORAL_TABLET | ORAL | Status: DC
  Administered 2021-12-09 – 2021-12-10 (×2): 225 ug via ORAL
  Filled 2021-12-08 (×2): qty 3

## 2021-12-08 MED ORDER — LORAZEPAM 0.5 MG PO TABS: 0.2500 mg | ORAL_TABLET | Freq: Every evening | ORAL | Status: DC | PRN

## 2021-12-08 MED ORDER — ASPIRIN EC 81 MG PO TBEC
81.0000 mg | DELAYED_RELEASE_TABLET | Freq: Every day | ORAL | Status: DC
  Administered 2021-12-09 – 2021-12-12 (×3): 81 mg via ORAL
  Filled 2021-12-08 (×3): qty 1

## 2021-12-08 MED ORDER — SODIUM CHLORIDE 0.9% FLUSH: 3.0000 mL | INTRAVENOUS | Status: DC | PRN

## 2021-12-08 MED ORDER — METOPROLOL TARTRATE 12.5 MG HALF TABLET
12.5000 mg | ORAL_TABLET | Freq: Four times a day (QID) | ORAL | Status: DC
  Administered 2021-12-08 – 2021-12-09 (×3): 12.5 mg via ORAL
  Filled 2021-12-08 (×3): qty 1

## 2021-12-08 MED ORDER — SODIUM CHLORIDE 0.9 % IV SOLN
500.0000 mg | Freq: Once | INTRAVENOUS | Status: AC
  Administered 2021-12-08: 500 mg via INTRAVENOUS
  Filled 2021-12-08: qty 5

## 2021-12-08 MED ORDER — ACETAMINOPHEN 325 MG PO TABS
650.0000 mg | ORAL_TABLET | ORAL | Status: DC | PRN
  Administered 2021-12-13 – 2021-12-22 (×4): 650 mg via ORAL
  Filled 2021-12-08 (×4): qty 2

## 2021-12-08 MED ORDER — SODIUM CHLORIDE 0.9 % IV SOLN: 250.0000 mL | INTRAVENOUS | Status: DC | PRN

## 2021-12-08 MED ORDER — HEPARIN (PORCINE) 25000 UT/250ML-% IV SOLN
1250.0000 [IU]/h | INTRAVENOUS | Status: DC
Start: 2021-12-08 — End: 2021-12-11
  Administered 2021-12-08: 15:00:00 1150 [IU]/h via INTRAVENOUS
  Administered 2021-12-09 – 2021-12-10 (×2): 1200 [IU]/h via INTRAVENOUS
  Administered 2021-12-11: 1250 [IU]/h via INTRAVENOUS
  Filled 2021-12-08 (×4): qty 250

## 2021-12-08 MED ORDER — SODIUM CHLORIDE 0.9% FLUSH
3.0000 mL | Freq: Two times a day (BID) | INTRAVENOUS | Status: DC
  Administered 2021-12-08 – 2021-12-22 (×26): 3 mL via INTRAVENOUS

## 2021-12-08 MED ORDER — PRAVASTATIN SODIUM 40 MG PO TABS
40.0000 mg | ORAL_TABLET | Freq: Every day | ORAL | Status: DC
  Administered 2021-12-09 – 2021-12-21 (×13): 40 mg via ORAL
  Filled 2021-12-08 (×13): qty 1

## 2021-12-08 MED ORDER — VITAMIN B-1 50 MG PO TABS
125.0000 mg | ORAL_TABLET | Freq: Every evening | ORAL | Status: DC
  Administered 2021-12-08 – 2021-12-16 (×9): 125 mg via ORAL
  Filled 2021-12-08 (×11): qty 1

## 2021-12-08 MED ORDER — ALBUTEROL SULFATE HFA 108 (90 BASE) MCG/ACT IN AERS
2.0000 | INHALATION_SPRAY | RESPIRATORY_TRACT | Status: DC | PRN
  Administered 2021-12-08: 2 via RESPIRATORY_TRACT
  Filled 2021-12-08: qty 6.7

## 2021-12-08 MED ORDER — ONDANSETRON HCL 4 MG/2ML IJ SOLN
4.0000 mg | Freq: Four times a day (QID) | INTRAMUSCULAR | Status: DC | PRN
  Filled 2021-12-08: qty 2

## 2021-12-08 MED ORDER — FLUOXETINE HCL 20 MG PO CAPS
40.0000 mg | ORAL_CAPSULE | Freq: Every morning | ORAL | Status: DC
  Administered 2021-12-09: 40 mg via ORAL
  Filled 2021-12-08: qty 2

## 2021-12-08 MED ORDER — HEPARIN BOLUS VIA INFUSION
4000.0000 [IU] | Freq: Once | INTRAVENOUS | Status: AC
  Administered 2021-12-08: 4000 [IU] via INTRAVENOUS

## 2021-12-08 MED ORDER — CEFTRIAXONE SODIUM 2 G IJ SOLR
2.0000 g | INTRAMUSCULAR | Status: DC
  Filled 2021-12-08: qty 20

## 2021-12-08 MED ORDER — LORAZEPAM 0.5 MG PO TABS
0.5000 mg | ORAL_TABLET | Freq: Every day | ORAL | Status: DC | PRN
  Administered 2021-12-08 – 2021-12-11 (×4): 0.5 mg via ORAL
  Filled 2021-12-08 (×5): qty 1

## 2021-12-08 NOTE — Assessment & Plan Note (Addendum)
Patient with high TSH along with low free T4 and free T3. ?On levothyroxine and follow up thyroid function test in 3 weeks as outpatient.  ? ?

## 2021-12-08 NOTE — Assessment & Plan Note (Addendum)
-   Clinically do not suspect pneumonia, chest x-ray findings are suggestive of CHF and atelectasis ?-Afebrile, no leukocytosis, lactate and procalcitonin are normal ?-discontinued antibiotics yesterday ?

## 2021-12-08 NOTE — Consult Note (Signed)
Cardiology Consultation:   Patient ID: [name] MRN: [id number]; DOB: Nov 26, 1930  Admit date: 12/08/2021 Date of Consult: 12/08/2021  PCP:  Jani Gravel, MD   [facility] Providers Cardiologist:  Dr. Daneen Schick Click here to update MD or APP on Care Team, Refresh:1}     Patient Profile:   [name] is a 88 y.o. female with a hx of hypothyroidism, HTN, HLD and GERD who is being seen 12/08/2021 for the evaluation of new onset atrial fibrillation at the request of Dr. Fuller Plan.  History of Present Illness:   [name] reports that at 4 AM this morning she awoke with significant SOB.  She states that for the past couple days she has noticed increased SOB with exertion.  She denies most of the symptoms except for increased lower extremity edema that typically resolves by morning.  She denies chest pain, palpitations, PND, orthopnea, fevers, chills, nausea, vomiting, diarrhea, abdominal pain, weakness, or numbness.  She initially felt that the symptoms were due to fatigue associated with caring for her husband until her SOB acutely worsened this morning.  Given her symptoms her daughter brought her to the ED for evaluation.  Of note the patient has no prior history or family history of atrial fibrillation that she discloses.  She lives in [place] with her husband without any pets.  She denies tobacco, EtOH, and illicit drug use.  She is retired.  In the ED her VS were afebrile, HR 116, BP 146/81, RR 20, satting 94% on RA.  Labs notable for normal WBC, Na 125, creatinine 0.7, troponin 6, and BNP 453.8.  CXR was notable for bilateral interstitial thickening concerning for edema versus pneumonia.  COVID test negative.  EKG showed atrial fibrillation with RVR and RBBB.  In the ED she was started on heparin and given 5 mg IV metoprolol.  She was also started on ceftriaxone azithromycin for possible pneumonia and given 20 mg IV Lasix.  Cardiology is consulted for evaluation of  new onset atrial fibrillation.   Past Medical History:  Diagnosis Date   Dyspnea    diastolic dysfunction by echo 2012   GERD (gastroesophageal reflux disease)    Hypertension    Mild mitral and aortic regurgitation    Osteoarthritis    Right bundle branch block 2014   Thyroid disease     Past Surgical History:  Procedure Laterality Date   arm surgery     following an accident   KNEE SURGERY     Following an accident   [employer] Medications:  Prior to Admission medications   Medication Sig Start Date End Date Taking? Authorizing Provider  amLODipine (NORVASC) 2.5 MG tablet Take 2.5 mg by mouth daily.   Yes [provider]  aspirin 81 MG tablet Take 81 mg by mouth daily.   Yes [provider]  Calcium Carbonate-Vitamin D 500-125 MG-UNIT TABS Take 1 tablet by mouth every evening.   Yes [provider]  Cholecalciferol (VITAMIN D) 50 MCG (2000 UT) CAPS Take 2,000 Units by mouth daily. 03/26/21  Yes [provider]  FLUoxetine (PROZAC) 40 MG capsule Take 40 mg by mouth every morning.   Yes [provider]  fluticasone (FLONASE) 50 MCG/ACT nasal spray Place 1 spray into both nostrils daily as needed for allergies or rhinitis.   Yes [provider]  levothyroxine (SYNTHROID, LEVOTHROID) 150 MCG tablet Take 150-225 mcg  by mouth See admin instructions. Takes 150 mg on Thursday, Friday and Saturday and 225 mg on all other days.   Yes [provider]  LORazepam (ATIVAN) 0.5 MG tablet Take 0.5 mg by mouth daily as needed for anxiety. 03/18/21  Yes [provider]  lovastatin (MEVACOR) 40 MG tablet Take 60 mg by mouth at bedtime.   Yes [provider]  Multiple Vitamin (MULTIVITAMIN WITH MINERALS) TABS tablet Take 1 tablet by mouth every evening.   Yes [provider]  Multiple Vitamins-Minerals ([place]) CAPS Take 1 capsule by  mouth every evening.   Yes [provider]  naproxen (NAPROSYN) 500 MG tablet Take 500 mg by mouth daily as needed for moderate pain. 07/24/21  Yes [provider]  Omega-3 Fatty Acids (FISH OIL) 1200 MG CAPS Take by mouth.   Yes [provider]  omeprazole (PRILOSEC) 10 MG capsule Take 10 mg by mouth every evening. 10/16/21  Yes [provider]  Thiamine HCl (VITAMIN B-1) 250 MG tablet Take 125 mg by mouth every evening.   Yes [provider]  vitamin B-12 (CYANOCOBALAMIN) 1000 MCG tablet Take 1,000 mcg by mouth daily.   Yes [provider]    Inpatient Medications: Scheduled Meds:  [START ON 12/09/2021] aspirin EC  81 mg Oral Daily   [START ON 12/09/2021] FLUoxetine  40 mg Oral q morning   [START ON 12/12/2021] levothyroxine  150 mcg Oral Once per day on Thu Fri Sat   [START ON 12/09/2021] levothyroxine  225 mcg Oral Once per day on Sun Mon Tue Wed   metoprolol tartrate  12.5 mg Oral Q6H   [START ON 12/09/2021] pravastatin  40 mg Oral q1800   sodium chloride flush  3 mL Intravenous Q12H   vitamin B-1  125 mg Oral QPM   Continuous Infusions:  sodium chloride     cefTRIAXone (ROCEPHIN)  IV     [START ON 12/09/2021] cefTRIAXone (ROCEPHIN)  IV     heparin 1,150 Units/hr (12/08/21 1451)   PRN Meds: sodium chloride, acetaminophen, albuterol, fluticasone, LORazepam, ondansetron (ZOFRAN) IV, sodium chloride flush  Allergies:   No Known Allergies  Social History:   Social History   Socioeconomic History   Marital status: Married    Spouse name: Not on file   Number of children: Not on file   Years of education: Not on file   Highest education level: Not on file  Occupational History   Not on file  Tobacco Use   Smoking status: Never   Smokeless tobacco: Never  Vaping Use   Vaping Use: Never used  Substance and Sexual Activity   Alcohol use: No   Drug use: No   Sexual activity: Not on file  Other Topics Concern   Not on file  Social  History Narrative   Not on file   Social Determinants of Health   Financial Resource Strain: Not on file  Food Insecurity: Not on file  Transportation Needs: Not on file  Physical Activity: Not on file  Stress: Not on file  Social Connections: Not on file  Intimate Partner Violence: Not on file    Family History:    Family History  Problem Relation Age of Onset   Heart attack Mother      ROS:  Please see the history of present illness.  All other ROS reviewed and negative.     Physical Exam/Data:   Vitals:   12/08/21 1400 12/08/21 1430 12/08/21 1623  12/08/21 1624  BP: 130/76 140/87 (!) 147/104 (!) 143/92  Pulse: 88 (!) 103 (!) 109 (!) 102  Resp: (!) 21 (!) 21 18 (!) 24  Temp:    97.7 F (36.5 C)  TempSrc:    Oral  SpO2: 92% 94% 93% 93%  Weight:      Height:       No intake or output data in the 24 hours ending 12/08/21 1801 Last 3 Weights 12/08/2021  Weight (lbs) 208 lb  Weight (kg) 94.348 kg     Body mass index is 33.07 kg/m.  General:  Well nourished, well developed, in no acute distress, very pleasant HEENT: Atraumatic, normocephalic, MMM, + frank's sign on right ear Neck: JVD elevated to tragus at 35 degrees Vascular: No carotid bruits; Distal pulses 2+ bilaterally Cardiac: Tachycardic with irregularly irregular rhythm, normal S1/S2, no M/R/G Lungs: Bibasilar Rales, faint end expiratory wheeze in the upper left lung field, no rhonchi, adequate air exchange Abd: soft, nontender, no hepatomegaly  Ext: WWP, 1+ pitting edema bilaterally to shins Musculoskeletal:  No deformities, BUE and BLE strength normal and equal Skin: warm and dry  Neuro:  CNs 2-12 intact, no focal abnormalities noted Psych:  Normal affect   EKG:  The EKG was personally reviewed and demonstrates: Atrial fibrillation with RVR and an RBBB Telemetry:  Telemetry was personally reviewed and demonstrates: Same as EKG  Relevant CV Studies:  TTE 03/04/2016:  Study Conclusions   - Left  ventricle: The cavity size was normal. Wall thickness was    normal. Systolic function was normal. The estimated ejection    fraction was in the range of 50% to 55%.  - Aortic valve: Calcified non coronary cusp. There was trivial    regurgitation.  - Mitral valve: There was mild regurgitation.  - Left atrium: The atrium was mildly dilated.  - Atrial septum: No defect or patent foramen ovale was identified.   Laboratory Data:  High Sensitivity Troponin:   Recent Labs  Lab 12/08/21 1158  TROPONINIHS 6     Chemistry Recent Labs  Lab 12/08/21 1158 12/08/21 1648  NA 125*  --   K 4.1  --   CL 93*  --   CO2 23  --   GLUCOSE 102*  --   BUN 13  --   CREATININE 0.70  --   CALCIUM 8.9  --   MG  --  1.9  GFRNONAA >60  --   ANIONGAP 9  --     Recent Labs  Lab 12/08/21 1158  PROT 6.8  ALBUMIN 4.0  AST 18  ALT 15  ALKPHOS 81  BILITOT 0.5   Lipids No results for input(s): CHOL, TRIG, HDL, LABVLDL, LDLCALC, CHOLHDL in the last 168 hours.  Hematology Recent Labs  Lab 12/08/21 1158  WBC 8.7  RBC 4.41  HGB 11.9*  HCT 36.5  MCV 82.8  MCH 27.0  MCHC 32.6  RDW 13.7  PLT 323   Thyroid No results for input(s): TSH, FREET4 in the last 168 hours.  BNP Recent Labs  Lab 12/08/21 1158  BNP 453.8*    DDimer  Recent Labs  Lab 12/08/21 1158  DDIMER 0.80*     Radiology/Studies:  DG Chest Port 1 View  Result Date: 12/08/2021 CLINICAL DATA:  Shortness of breath EXAM: PORTABLE CHEST 1 VIEW COMPARISON:  None. FINDINGS: Bilateral reticulonodular interstitial thickening. Left lower lobe retrocardiac airspace disease. Small left pleural effusion. No pneumothorax. Mild cardiomegaly. No acute osseous abnormality. Multiple left  posterior rib fractures. IMPRESSION: 1. Bilateral interstitial thickening which may reflect interstitial edema versus pneumonia including atypical viral etiologies. 2. Left lower lobe retrocardiac airspace disease which may reflect atelectasis versus pneumonia.  Electronically Signed   By: Kathreen Devoid M.D.   On: 12/08/2021 11:40     Assessment and Plan:   CELISE BAZAR is a 88 y.o. female with a hx of hypothyroidism, HTN, HLD and GERD who is being seen 12/08/2021 for the evaluation of new onset atrial fibrillation at the request of Dr. Fuller Plan.  #New Onset Atrial Fibrillation with RVR :: Patient presenting with acute on subacute SOB found to have new onset atrial fibrillation with RVR.  This is likely the etiology of her symptoms.  CHA2DS2-VASc score = 4 which necessitates indefinite OAC.  Agree with continuing heparin for now with eventual transition to a DOAC.  I think this patient would benefit from being in NSR, so recommend pursuing TEE + DCCV tomorrow.  We will rate control with metoprolol tartrate p.o. for now.  She will also need a transthoracic echo as well. -Continue heparin -Discontinue ASA 81 mg now and at discharge given the initiation of therapeutic anticoagulation -TEE and DCCV tomorrow -NPO at MN -TTE tomorrow -Start metoprolol tartrate 12.5 mg every 6 hours for rate control -Maintain telemetry -Keep K >4, Mg >2 -Check TSH -Check A1c  #Decompensated CHF, NHYA Class II :: Patient with clinical evidence of CHF on exam with elevated JVD and bilateral lower extremity edema.  She has reported history of CHF in the chart; however, her last echo showed a preserved EF and she is not on any HFpEF treatment currently.  I suspect that her EF will be slightly depressed in the setting of atrial fibrillation.  Recommend getting a formal echo tomorrow to quantify her EF.  Further recommend ongoing diuresis with close monitoring of I's and O's and daily weights. -TTE tomorrow -s/p 20 mg x2, discontinue standing Lasix -Spot bolus 40 mg IV Lasix prn to achieve net -1L daily -Strict I's and O's -Daily weights  #HTN #HLD -Continue home amlodipine 2.5 mg daily -Continue home pravastatin 40 mg daily   Risk Assessment/Risk Scores:         New York Heart Association (NYHA) Functional Class NYHA Class II  CHA2DS2-VASc Score = 4 This indicates a 4.8% annual risk of stroke. The patient's score is based upon: Age >47, female gender, HTN        For questions or updates, please contact [facility] Please consult [email or web address] for contact info under    Signed, Hershal Coria, MD  12/08/2021 6:01 PM

## 2021-12-08 NOTE — Assessment & Plan Note (Addendum)
Hypokalemia/ hypomagnesemia.   Today serum Na is 124, K is 3,5 and serum bicarbonate is 29.  Diuresis with furosemide drip now transitioned to torsemide po.   Continue nutritional support to increase urine solute.  Ensure.  Follow up renal function in am, including Mg.

## 2021-12-08 NOTE — Assessment & Plan Note (Deleted)
Patient presents with shortness of breath.  Patient was placed on 2 L of nasal cannula oxygen for comfort.  Chest x-ray noted concern for edema versus pneumonia.  On physical exam patient noted to have crackles with intermittent wheezes and +1-2 pitting edema to bilateral lower extremities.  Labs significant for BNP 453.8.  Last echocardiogram noted EF of 50 to 55% with mild aortic and mitral valve regurgitation.  Patient has been given Lasix 20 mg IV x1 dose ?-Heart failure order set utilized ?-Continue nasal cannula oxygen as needed ?-Strict I&O's and daily weights ?-Elevate lower extremity ?-Lasix 20 mg IV twice daily ?-Albuterol treatments as needed ?

## 2021-12-08 NOTE — ED Provider Notes (Incomplete)
[facility] EMERGENCY DEPT Provider Note   CSN: [id number] Arrival date & time: 12/08/21  1107     History {Add pertinent medical, surgical, social history, OB history to HPI:1} Chief Complaint  Patient presents with   Shortness of Breath    [name] is a 88 y.o. female.  HPI     Home Medications Prior to Admission medications   Medication Sig Start Date End Date Taking? Authorizing Provider  amLODipine (NORVASC) 2.5 MG tablet Take 2.5 mg by mouth daily.    [provider]  aspirin 81 MG tablet Take 81 mg by mouth daily.    [provider]  Calcium Carbonate-Vitamin D (CALCIUM 500 + D) 500-125 MG-UNIT TABS Take by mouth.    [provider]  levothyroxine (SYNTHROID, LEVOTHROID) 150 MCG tablet Take 150 mcg by mouth daily before breakfast.    [provider]  lovastatin (MEVACOR) 40 MG tablet Take 40 mg by mouth at bedtime.    [provider]  Omega-3 Fatty Acids (FISH OIL) 1200 MG CAPS Take by mouth.    [provider]  omeprazole (PRILOSEC) 20 MG capsule Take 20 mg by mouth daily.    [provider]  traMADol (ULTRAM) 50 MG tablet Take 1 tablet (50 mg total) by mouth every 6 (six) hours as needed. 01/31/18   Milton Ferguson, MD      Allergies    Patient has no known allergies.    Review of Systems   Review of Systems  Physical Exam Updated Vital Signs BP (!) 146/81 (BP Location: Right Arm)    Pulse (!) 116    Temp (!) 97.5 F (36.4 C)    Resp 20    Ht 5' 6.5" (1.689 m)    Wt 94.3 kg    SpO2 94%    BMI 33.07 kg/m  Physical Exam  ED Results / Procedures / Treatments   Labs (all labs ordered are listed, but only abnormal results are displayed) Labs Reviewed  RESP PANEL BY RT-PCR (FLU A&B, COVID) ARPGX2  CBC  BASIC METABOLIC PANEL    EKG None  Radiology DG Chest Port 1 View  Result Date: 12/08/2021 CLINICAL DATA:  Shortness of breath EXAM: PORTABLE CHEST 1 VIEW COMPARISON:   None. FINDINGS: Bilateral reticulonodular interstitial thickening. Left lower lobe retrocardiac airspace disease. Small left pleural effusion. No pneumothorax. Mild cardiomegaly. No acute osseous abnormality. Multiple left posterior rib fractures. IMPRESSION: 1. Bilateral interstitial thickening which may reflect interstitial edema versus pneumonia including atypical viral etiologies. 2. Left lower lobe retrocardiac airspace disease which may reflect atelectasis versus pneumonia. Electronically Signed   By: Kathreen Devoid M.D.   On: 12/08/2021 11:40    Procedures Procedures  {Document cardiac monitor, telemetry assessment procedure when appropriate:1}  Medications Ordered in ED Medications  albuterol (VENTOLIN HFA) 108 (90 Base) MCG/ACT inhaler 2 puff (has no administration in time range)    ED Course/ Medical Decision Making/ A&P                           Medical Decision Making Amount and/or Complexity of Data Reviewed Labs: ordered. Radiology: ordered.  Risk Prescription drug management.   ***  {Document critical care time when appropriate:1} {Document review of labs and clinical decision tools ie heart score, Chads2Vasc2 etc:1}  {Document your independent review of radiology images, and any outside records:1} {Document your discussion with family members, caretakers, and with consultants:1} {Document social determinants of health  affecting pt's care:1} {Document your decision making why or why not admission, treatments were needed:1} Final Clinical Impression(s) / ED Diagnoses Final diagnoses:  SOB (shortness of breath)    Rx / DC Orders ED Discharge Orders     None

## 2021-12-08 NOTE — Progress Notes (Signed)
ANTICOAGULATION CONSULT NOTE - Initial Consult ? ?Pharmacy Consult for heparin ?Indication: atrial fibrillation ? ?No Known Allergies ? ?Patient Measurements: ?Height: 5' 6.5" (168.9 cm) ?Weight: 94.3 kg (208 lb) ?IBW/kg (Calculated) : 60.45 ?Heparin Dosing Weight: 81kg ? ?Vital Signs: ?Temp: 97.5 ?F (36.4 ?C) (03/05 1115) ?BP: 115/77 (03/05 1300) ?Pulse Rate: 105 (03/05 1300) ? ?Labs: ?Recent Labs  ?  12/08/21 ?1158  ?HGB 11.9*  ?HCT 36.5  ?PLT 323  ?CREATININE 0.70  ?TROPONINIHS 6  ? ? ?Estimated Creatinine Clearance: 56.8 mL/min (by C-G formula based on SCr of 0.7 mg/dL). ? ? ?Medical History: ?Past Medical History:  ?Diagnosis Date  ? Dyspnea   ? diastolic dysfunction by echo 2012  ? GERD (gastroesophageal reflux disease)   ? Hypertension   ? Mild mitral and aortic regurgitation   ? Osteoarthritis   ? Right bundle branch block 2014  ? Thyroid disease   ? ? ?Assessment: ?79 YOF presenting with SOB, in Afib with RVR, she is not on anticoagulation PTA.  H/H 11.9/36.5, plts wnl ? ?Goal of Therapy:  ?Heparin level 0.3-0.7 units/ml ?Monitor platelets by anticoagulation protocol: Yes ?  ?Plan:  ?Heparin 4000 units IV x 1, and gtt at 1150 units/hr ?F/u 8 hour heparin level ? ?Bertis Ruddy, PharmD ?Clinical Pharmacist ?ED Pharmacist Phone # [phone] ?12/08/2021 1:17 PM ? ? ? ?

## 2021-12-08 NOTE — Progress Notes (Signed)
ANTICOAGULATION CONSULT NOTE  ? ?Pharmacy Consult for heparin ?Indication: atrial fibrillation ? ?No Known Allergies ? ?Patient Measurements: ?Height: 5' 6.5" (168.9 cm) ?Weight: 94.3 kg (208 lb) ?IBW/kg (Calculated) : 60.45 ?Heparin Dosing Weight: 81kg ? ?Vital Signs: ?Temp: 98.8 ?F (37.1 ?C) (03/05 1947) ?Temp Source: Oral (03/05 1947) ?BP: 128/90 (03/05 1947) ?Pulse Rate: 109 (03/05 1947) ? ?Labs: ?Recent Labs  ?  12/08/21 ?1158 12/08/21 ?2153  ?HGB 11.9*  --   ?HCT 36.5  --   ?PLT 323  --   ?HEPARINUNFRC  --  0.38  ?CREATININE 0.70  --   ?TROPONINIHS 6  --   ? ? ? ?Estimated Creatinine Clearance: 56.8 mL/min (by C-G formula based on SCr of 0.7 mg/dL). ? ? ?Medical History: ?Past Medical History:  ?Diagnosis Date  ? Dyspnea   ? diastolic dysfunction by echo 2012  ? GERD (gastroesophageal reflux disease)   ? Hypertension   ? Mild mitral and aortic regurgitation   ? Osteoarthritis   ? Right bundle branch block 2014  ? Thyroid disease   ? ? ?Assessment: ?25 YOF presenting with SOB, in Afib with RVR, she is not on anticoagulation PTA.  H/H 11.9/36.5, plts wnl ? ?Initial heparin level at goal on 1150 units/hr. No bleeding or IV issues noted.  ? ?Goal of Therapy:  ?Heparin level 0.3-0.7 units/ml ?Monitor platelets by anticoagulation protocol: Yes ?  ?Plan:  ?Continue heparin at 1150 units/hr ?F/u 8 hour heparin level to confirm ? ?Erin Hearing PharmD., BCPS ?Clinical Pharmacist ?12/08/2021 11:40 PM ? ?

## 2021-12-08 NOTE — ED Provider Notes (Signed)
[facility] EMERGENCY DEPT Provider Note   CSN: [id number] Arrival date & time: 12/08/21  1107     History  Chief Complaint  Patient presents with   Shortness of Breath    [name] is a 88 y.o. female.  HPI     Shortness of breath, ongoing cough that comes and goes off and on all the time, constantly on cough drops Husband has been in the hospital, she has been taking are of him, this AM daughter went over and she said she was short of breath  Shortness of breath and coufh for a few weeks A day or so ago started getting worse, thought it was allergies, years ago sh ewould get bronchitis , the last day or so it has continued to worsen  No fevers, nausea, vomiting, no new leg pain or swelling, but does usually have a little swelling before bed  Blowing nose not sure if allergies, chronic  Takes aspirin  Has pulmonologist appointment in 2 weeks   Past Medical History:  Diagnosis Date   Dyspnea    diastolic dysfunction by echo 2012   GERD (gastroesophageal reflux disease)    Hypertension    Mild mitral and aortic regurgitation    Osteoarthritis    Right bundle branch block 2014   Thyroid disease     Home Medications Prior to Admission medications   Medication Sig Start Date End Date Taking? Authorizing Provider  amLODipine (NORVASC) 2.5 MG tablet Take 2.5 mg by mouth daily.   Yes [provider]  aspirin 81 MG tablet Take 81 mg by mouth daily.   Yes [provider]  Calcium Carbonate-Vitamin D 500-125 MG-UNIT TABS Take 1 tablet by mouth every evening.   Yes [provider]  Cholecalciferol (VITAMIN D) 50 MCG (2000 UT) CAPS Take 2,000 Units by mouth daily. 03/26/21  Yes [provider]  FLUoxetine (PROZAC) 40 MG capsule Take 40 mg by mouth every morning.   Yes [provider]  fluticasone (FLONASE) 50 MCG/ACT nasal spray Place 1 spray into both nostrils daily as needed for allergies or rhinitis.   Yes  [provider]  levothyroxine (SYNTHROID, LEVOTHROID) 150 MCG tablet Take 150-225 mcg by mouth See admin instructions. Takes 150 mg on Thursday, Friday and Saturday and 225 mg on all other days.   Yes [provider]  LORazepam (ATIVAN) 0.5 MG tablet Take 0.5 mg by mouth daily as needed for anxiety. 03/18/21  Yes [provider]  lovastatin (MEVACOR) 40 MG tablet Take 60 mg by mouth at bedtime.   Yes [provider]  Multiple Vitamin (MULTIVITAMIN WITH MINERALS) TABS tablet Take 1 tablet by mouth every evening.   Yes [provider]  Multiple Vitamins-Minerals ([place]) CAPS Take 1 capsule by mouth every evening.   Yes [provider]  naproxen (NAPROSYN) 500 MG tablet Take 500 mg by mouth daily as needed for moderate pain. 07/24/21  Yes [provider]  Omega-3 Fatty Acids (FISH OIL) 1200 MG CAPS Take by mouth.   Yes [provider]  omeprazole (PRILOSEC) 10 MG capsule Take 10 mg by mouth every evening. 10/16/21  Yes [provider]  Thiamine HCl (VITAMIN B-1) 250 MG tablet Take 125 mg by mouth every evening.   Yes [provider]  vitamin B-12 (CYANOCOBALAMIN) 1000 MCG tablet Take 1,000 mcg by mouth daily.   Yes [provider]      Allergies    Patient has no  known allergies.    Review of Systems   Review of Systems See above Physical Exam Updated Vital Signs BP (!) 129/92 (BP Location: Right Arm)    Pulse (!) 102    Temp 98.1 F (36.7 C) (Oral)    Resp 20    Ht 5' 6.5" (1.689 m)    Wt 94.3 kg    SpO2 93%    BMI 33.07 kg/m  Physical Exam Vitals and nursing note reviewed.  Constitutional:      General: She is not in acute distress.    Appearance: She is well-developed. She is not diaphoretic.  HENT:     Head: Normocephalic and atraumatic.  Eyes:     Conjunctiva/sclera: Conjunctivae normal.  Cardiovascular:     Rate and Rhythm: Tachycardia present. Rhythm irregular.      Heart sounds: Normal heart sounds. No murmur heard.   No friction rub. No gallop.  Pulmonary:     Effort: Pulmonary effort is normal. No respiratory distress.     Breath sounds: Rales present. No wheezing.  Abdominal:     General: There is no distension.     Palpations: Abdomen is soft.     Tenderness: There is no abdominal tenderness. There is no guarding.  Musculoskeletal:     Cervical back: Normal range of motion.     Right lower leg: Tenderness: report chronic RLE tenderness (had previous US). Edema present.     Left lower leg: Edema present.  Skin:    General: Skin is warm and dry.     Findings: No erythema or rash.  Neurological:     Mental Status: She is alert and oriented to person, place, and time.    ED Results / Procedures / Treatments   Labs (all labs ordered are listed, but only abnormal results are displayed) Labs Reviewed  CBC WITH DIFFERENTIAL/PLATELET - Abnormal; Notable for the following components:      Result Value   Hemoglobin 11.9 (*)    All other components within normal limits  COMPREHENSIVE METABOLIC PANEL - Abnormal; Notable for the following components:   Sodium 125 (*)    Chloride 93 (*)    Glucose, Bld 102 (*)    All other components within normal limits  BRAIN NATRIURETIC PEPTIDE - Abnormal; Notable for the following components:   B Natriuretic Peptide 453.8 (*)    All other components within normal limits  D-DIMER, QUANTITATIVE - Abnormal; Notable for the following components:   D-Dimer, Quant 0.80 (*)    All other components within normal limits  TSH - Abnormal; Notable for the following components:   TSH 7.190 (*)    All other components within normal limits  GLUCOSE, CAPILLARY - Abnormal; Notable for the following components:   Glucose-Capillary 119 (*)    All other components within normal limits  RESP PANEL BY RT-PCR (FLU A&B, COVID) ARPGX2  MRSA NEXT GEN BY PCR, NASAL  CULTURE, BLOOD (ROUTINE X 2)  CULTURE, BLOOD (ROUTINE X 2)   LACTIC ACID, PLASMA  PROCALCITONIN  MAGNESIUM  HEPARIN LEVEL (UNFRACTIONATED)  HEMOGLOBIN X1G  BASIC METABOLIC PANEL  HEPARIN LEVEL (UNFRACTIONATED)  CBC  TROPONIN I (HIGH SENSITIVITY)    EKG EKG Interpretation  Date/Time:  Sunday December 08 2021 11:19:34 EST Ventricular Rate:  115 PR Interval:    QRS Duration: 122 QT Interval:  380 QTC Calculation: 525 R Axis:   40 Text Interpretation: Atrial fibrillation with rapid ventricular response Right bundle branch block Abnormal ECG No previous ECGs available  Confirmed by Gareth Morgan [phone]) on 12/09/2021 12:41:16 AM  Radiology DG Chest Port 1 View  Result Date: 12/08/2021 CLINICAL DATA:  Shortness of breath EXAM: PORTABLE CHEST 1 VIEW COMPARISON:  None. FINDINGS: Bilateral reticulonodular interstitial thickening. Left lower lobe retrocardiac airspace disease. Small left pleural effusion. No pneumothorax. Mild cardiomegaly. No acute osseous abnormality. Multiple left posterior rib fractures. IMPRESSION: 1. Bilateral interstitial thickening which may reflect interstitial edema versus pneumonia including atypical viral etiologies. 2. Left lower lobe retrocardiac airspace disease which may reflect atelectasis versus pneumonia. Electronically Signed   By: Kathreen Devoid M.D.   On: 12/08/2021 11:40    Procedures Procedures    Medications Ordered in ED Medications  heparin ADULT infusion 100 units/mL (25000 units/272m) (1,150 Units/hr Intravenous Handoff 12/08/21 1631)  sodium chloride flush (NS) 0.9 % injection 3 mL (3 mLs Intravenous Given 12/08/21 2009)  sodium chloride flush (NS) 0.9 % injection 3 mL (has no administration in time range)  0.9 %  sodium chloride infusion (has no administration in time range)  acetaminophen (TYLENOL) tablet 650 mg (has no administration in time range)  ondansetron (ZOFRAN) injection 4 mg (has no administration in time range)  albuterol (PROVENTIL) (2.5 MG/3ML) 0.083% nebulizer solution 2.5 mg (has no  administration in time range)  cefTRIAXone (ROCEPHIN) 2 g in sodium chloride 0.9 % 100 mL IVPB (has no administration in time range)  aspirin EC tablet 81 mg (has no administration in time range)  LORazepam (ATIVAN) tablet 0.5 mg (0.5 mg Oral Given 12/08/21 2237)  levothyroxine (SYNTHROID) tablet 150 mcg (has no administration in time range)  FLUoxetine (PROZAC) capsule 40 mg (has no administration in time range)  thiamine (VITAMIN B-1) tablet 125 mg (125 mg Oral Given 12/08/21 1830)  fluticasone (FLONASE) 50 MCG/ACT nasal spray 1 spray (has no administration in time range)  pravastatin (PRAVACHOL) tablet 40 mg (has no administration in time range)  levothyroxine (SYNTHROID) tablet 225 mcg (has no administration in time range)  metoprolol tartrate (LOPRESSOR) tablet 12.5 mg (12.5 mg Oral Given 12/08/21 2009)  MEDLINE mouth rinse (has no administration in time range)  metoprolol tartrate (LOPRESSOR) injection 5 mg (5 mg Intravenous Given 12/08/21 1211)  cefTRIAXone (ROCEPHIN) 1 g in sodium chloride 0.9 % 100 mL IVPB (0 g Intravenous Stopped 12/08/21 1253)  azithromycin (ZITHROMAX) 500 mg in sodium chloride 0.9 % 250 mL IVPB (0 mg Intravenous Stopped 12/08/21 1337)  furosemide (LASIX) injection 20 mg (20 mg Intravenous Given 12/08/21 1314)  heparin bolus via infusion 4,000 Units (4,000 Units Intravenous Bolus from Bag 12/08/21 1451)  cefTRIAXone (ROCEPHIN) 1 g in sodium chloride 0.9 % 100 mL IVPB (1 g Intravenous New Bag/Given 12/08/21 1757)    ED Course/ Medical Decision Making/ A&P                            [phone] with history of hypertension, valvular disease, diastolic dysfunction, presents with concern for dyspnea and cough.  Differential diagnosis for dyspnea includes ACS, PE, COPD exacerbation, CHF exacerbation, anemia, pneumonia, viral etiology such as COVID 19 infection, metabolic abnormality.  Chest x-ray was done and evaluated by me and showed interstitial opacities, possible edema or  infection.  . EKG was evaluated by me which showed new onset atrial fibrillation with RVR.  BNP was elevated with no comparison, however given edema, orthopnea, feel volume overload likely.  DDimer age adjusted and negative and given this and other explanations for dyspnea do not feel emergent  CT indicated.  Denies CP, troponin negative, doubt ACS.    Atrial fibrillation new diagnosis--given metoprolol IV for RVR with improvement.  Heparin gtt ordered. Discussed diagnosis of atrial fibrillation with patient and daughter.    Labs show hyponatremia down to 125. Suspect likely hypervolemic. Given '20mg'$  IV lasx.   No leukocytosis, however givne possibility of pneumonia on CXR did get lactic aci (normal) an given antibiotics for possible CAP.   Will admit for symptomatic dyspnea, fatigue. Placed on 2L for comfort.          Final Clinical Impression(s) / ED Diagnoses Final diagnoses:  Atrial fibrillation with RVR (HCC)  Acute diastolic congestive heart failure ([place])  Community acquired pneumonia, unspecified laterality  Hyponatremia    Rx / DC Orders ED Discharge Orders     None         Gareth Morgan, MD 12/09/21 [phone]

## 2021-12-08 NOTE — ED Triage Notes (Signed)
Pt endorses SOB for a few weeks. Appt for pulmonologist in 2 weeks. Denies any pain, just cant catch her breath. Non-productive cough. ?

## 2021-12-08 NOTE — Assessment & Plan Note (Addendum)
Cell count has been stable ?Reactive leukocytosis 13.3 on 03/8  ?

## 2021-12-08 NOTE — H&P (Signed)
History and Physical    Patient: [name] EXH:[id number] DOB: 06-18-1931 DOA: 12/08/2021 DOS: the patient was seen and examined on 12/08/2021 PCP: Jani Gravel, MD  Patient coming from: Transfer from [place]  Chief Complaint:  Chief Complaint  Patient presents with   Shortness of Breath    HPI: [name] is a 88 y.o. female with medical history significant of hypertension, hyperlipidemia, mild aortic and mitral valve regurgitation, hypothyroidism, and GERD presents with complaints of being unable to breathe this morning around 4 AM.  She states that for the last couple of days she noticed that she was breathing harder, but thought it was related to her taking care of her husband.  She had set up an appointment with the pulmonologist related to this.  [name] let me know today is her wedding anniversary of being married over 49 years.  She denies at any fever, chills, chest pain, nausea, vomiting, leg pain, or significant leg swelling to her knowledge.  She chronically has little leg swelling at night and an intermittent cough for which she normally can take allergy medicine with improvement.  However, here recently had noticed that she had been coughing more than usual.  Cough was noted to be productive with intermittent wheezing.  Other associated symptoms included palpitations.  To her knowledge she has never been in atrial fibrillation before.  Upon admission into the emergency department patient was noted to be A-fib with heart rates into the 110s and it appears she was placed on 2 L of nasal cannula oxygen for comfort with O2 saturation maintained.  Labs noted hemoglobin 11.9, sodium 125, BNP 453.8, lactic acid 1.1.  Chest x-ray noting bilateral interstitial thickening concerning for either edema versus pneumonia in the left lower lobe retrocardiac opacity.  Influenza and COVID-19 negative.  Blood cultures had been obtained patient was given metoprolol 5 mg IV with, Lasix 20  mg, Rocephin, and azithromycin.  Question CHF exacerbation secondary to atrial fibrillation and/or possible pneumonia.  Accepted to a cardiac telemetry bed.  Review of Systems: As mentioned in the history of present illness. All other systems reviewed and are negative. Past Medical History:  Diagnosis Date   Dyspnea    diastolic dysfunction by echo 2012   GERD (gastroesophageal reflux disease)    Hypertension    Mild mitral and aortic regurgitation    Osteoarthritis    Right bundle branch block 2014   Thyroid disease    Past Surgical History:  Procedure Laterality Date   arm surgery     following an accident   KNEE SURGERY     Following an accident   TONSILLECTOMY AND ADENOIDECTOMY     VAGINAL HYSTERECTOMY     Social History:  reports that she has never smoked. She has never used smokeless tobacco. She reports that she does not drink alcohol and does not use drugs.  No Known Allergies  Family History  Problem Relation Age of Onset   Heart attack Mother     Prior to Admission medications   Medication Sig Start Date End Date Taking? Authorizing Provider  amLODipine (NORVASC) 2.5 MG tablet Take 2.5 mg by mouth daily.    [provider]  aspirin 81 MG tablet Take 81 mg by mouth daily.    [provider]  Calcium Carbonate-Vitamin D (CALCIUM 500 + D) 500-125 MG-UNIT TABS Take by mouth.    [provider]  levothyroxine (SYNTHROID, LEVOTHROID) 150 MCG tablet Take 150 mcg by mouth daily before  breakfast.    [provider]  lovastatin (MEVACOR) 40 MG tablet Take 40 mg by mouth at bedtime.    [provider]  Omega-3 Fatty Acids (FISH OIL) 1200 MG CAPS Take by mouth.    [provider]  omeprazole (PRILOSEC) 20 MG capsule Take 20 mg by mouth daily.    [provider]  traMADol (ULTRAM) 50 MG tablet Take 1 tablet (50 mg total) by mouth every 6 (six) hours as needed. 01/31/18   Barbara Ferguson, MD    Physical  Exam: Vitals:   12/08/21 1300 12/08/21 1400 12/08/21 1430 12/08/21 1623  BP: 115/77 130/76 140/87 (!) 147/104  Pulse: (!) 105 88 (!) 103 (!) 109  Resp: (!) 22 (!) 21 (!) 21 18  Temp:      SpO2: 93% 92% 94% 93%  Weight:      Height:       Exam Constitutional: Elderly female who appears to be in some respiratory discomfort Eyes: PERRL, lids and conjunctivae normal ENMT: Mucous membranes are moist. Posterior pharynx clear of any exudate or lesions.  Neck: normal, supple, no masses, no thyromegaly Respiratory: Mildly tachypneic with expiratory wheezing intermittent crackles appreciated.  O2 saturation maintained on 2 L nasal cannula oxygen. Cardiovascular: Irregular irregular with at least +1 pitting bilateral lower extremity edema. Abdomen: no tenderness, no masses palpated.  Bowel sounds positive.  Musculoskeletal: no clubbing / cyanosis. No joint deformity upper and lower extremities.   Skin: no rashes, lesions, ulcers. No induration Neurologic: CN 2-12 grossly intact.  Strength 5/5 in all 4.  Psychiatric: Normal judgment and insight. Alert and oriented x 3. Normal mood.    Data Reviewed:    EKG atrial fibrillation with RVR at 115 bpm Assessment and Plan: Respiratory distress secondary to acute exacerbation of congestive heart failure ([place]) Patient presents with shortness of breath.  Patient was placed on 2 L of nasal cannula oxygen for comfort.  Chest x-ray noted concern for edema versus pneumonia.  On physical exam patient noted to have crackles with +1-2 pitting edema to bilateral lower extremities.  Labs significant for BNP 453.8.  Last echocardiogram noted EF of 50 to 55% with mild aortic and mitral valve regurgitation.  Patient has been given Lasix 20 mg IV x1 dose -Heart failure order set utilized -Continue nasal cannula oxygen as needed -Strict I&O's and daily weights -Elevate lower extremity -Lasix 20 mg IV twice daily  SIRS (systemic inflammatory response syndrome)  (HCC) Acute.  Patient was noted to be tachycardic and tachypneic on admission meeting SIRS criteria.   Patient however was noted to be afebrile with WBC and lactic acid were within normal limits.  Chest x-ray gave concern for possible pneumonia after which patient was started on empiric antibiotics. Blood cultures had been obtained.  -Follow-up blood culture -Check procalcitonin -Continue empiric antibiotics at this time  Atrial fibrillation (HCC) Acute.  Patient was seen to be in atrial fibrillation with heart rates into the 110s. CHA2DS2-VASc score = at least 4.  She had been started on a heparin drip. -Continued heparin drip per pharmacy.  Likely transition to p.o. medication as long as hemoglobin stable -Goal to keep magnesium at least 2 and potassium at least 4.  We will replace electrolytes as needed -Follow-up TSH -Appreciate cardiology consultative services we will follow-up for any further recommended -Will need to discuss with patient the need to avoid naproxen being on anticoagulation  Essential hypertension Chronic.  Home medication regimen includes amlodipine 2.5 mg daily. -Held  amlodipine  Hypothyroidism Chronic.  Home medication regimen includes levothyroxine 150 to 225 mcg daily. -Check TSH -Continue levothyroxine  Hyponatremia Acute.  Sodium 125 on admission.  Low sodium thought to be secondary to patient being acutely fluid overloaded. -Recheck sodium levels in a.m.  Hyperlipidemia -Continue pharmacy substitution for lovastatin  Normocytic anemia On admission hemoglobin noted to be 11.9.  Denies any reports of bleeding -Recheck CBC in a.m.    Advance Care Planning:   Code Status: Full Code   Consults: cardiology  Family Communication: Daughter was being updated by cardiology  Severity of Illness: The appropriate patient status for this patient is INPATIENT. Inpatient status is judged to be reasonable and necessary in order to provide the required intensity  of service to ensure the patient's safety. The patient's presenting symptoms, physical exam findings, and initial radiographic and laboratory data in the context of their chronic comorbidities is felt to place them at high risk for further clinical deterioration. Furthermore, it is not anticipated that the patient will be medically stable for discharge from the hospital within 2 midnights of admission.   * I certify that at the point of admission it is my clinical judgment that the patient will require inpatient hospital care spanning beyond 2 midnights from the point of admission due to high intensity of service, high risk for further deterioration and high frequency of surveillance required.*  Author: Norval Morton, MD 12/08/2021 4:24 PM  For on call review [email or web address].

## 2021-12-08 NOTE — Plan of Care (Signed)
?  Problem: Education: ?Goal: Knowledge of General Education information will improve ?Description: Including pain rating scale, medication(s)/side effects and non-pharmacologic comfort measures ?Outcome: Progressing ?  ?Problem: Health Behavior/Discharge Planning: ?Goal: Ability to manage health-related needs will improve ?Outcome: Progressing ?  ?Problem: Clinical Measurements: ?Goal: Diagnostic test results will improve ?Outcome: Progressing ?  ?Problem: Activity: ?Goal: Risk for activity intolerance will decrease ?Outcome: Progressing ?  ?Problem: Coping: ?Goal: Level of anxiety will decrease ?Outcome: Progressing ?  ?

## 2021-12-08 NOTE — Plan of Care (Signed)
[name] is 88 y/o F with pmh HTN, HLD, MR/AR, GERD presents with couple weeks of worsening shortness of breath.  Found in A-fib with heart rates into the 110s.  Labs noted hemoglobin 11.9, sodium 125, BNP 453.8, lactic acid 1.1.  Chest x-ray noting bilateral interstitial thickening concerning for either edema versus pneumonia in the left lower lobe retrocardiac opacity.  Influenza and COVID-19 negative.  Blood cultures had been obtained patient was given metoprolol 5 mg IV with, Lasix 20 mg, Rocephin, and azithromycin.  Question CHF exacerbation secondary to atrial fibrillation and/or possible pneumonia.  Accepted to a cardiac telemetry bed. ?

## 2021-12-09 ENCOUNTER — Other Ambulatory Visit (HOSPITAL_COMMUNITY): Payer: Self-pay

## 2021-12-09 ENCOUNTER — Inpatient Hospital Stay (HOSPITAL_COMMUNITY): Payer: Medicare HMO

## 2021-12-09 DIAGNOSIS — R0603 Acute respiratory distress: Secondary | ICD-10-CM | POA: Diagnosis not present

## 2021-12-09 DIAGNOSIS — I5031 Acute diastolic (congestive) heart failure: Secondary | ICD-10-CM | POA: Diagnosis not present

## 2021-12-09 DIAGNOSIS — I4891 Unspecified atrial fibrillation: Principal | ICD-10-CM | POA: Diagnosis present

## 2021-12-09 DIAGNOSIS — I509 Heart failure, unspecified: Secondary | ICD-10-CM

## 2021-12-09 LAB — BASIC METABOLIC PANEL
Anion gap: 9 (ref 5–15)
BUN: 10 mg/dL (ref 8–23)
CO2: 25 mmol/L (ref 22–32)
Calcium: 8.7 mg/dL — ABNORMAL LOW (ref 8.9–10.3)
Chloride: 92 mmol/L — ABNORMAL LOW (ref 98–111)
Creatinine, Ser: 0.86 mg/dL (ref 0.44–1.00)
GFR, Estimated: >60 mL/min (ref >60)
Glucose, Bld: 109 mg/dL — ABNORMAL HIGH (ref 70–99)
Potassium: 3.8 mmol/L (ref 3.5–5.1)
Sodium: 126 mmol/L — ABNORMAL LOW (ref 135–145)

## 2021-12-09 LAB — ECHOCARDIOGRAM COMPLETE
AV Vena cont: 0.3 cm
Calc EF: 43.1 %
Height: 66.5 in
MV M vel: 4.83 m/s
MV Peak grad: 93.3 mmHg
P 1/2 time: 523 msec
Radius: 0.4 cm
S' Lateral: 3.6 cm
Single Plane A2C EF: 45.6 %
Single Plane A4C EF: 39.9 %
Weight: 3326.3 oz

## 2021-12-09 LAB — CBC
HCT: 36.7 % (ref 36.0–46.0)
Hemoglobin: 12.3 g/dL (ref 12.0–15.0)
MCH: 27.6 pg (ref 26.0–34.0)
MCHC: 33.5 g/dL (ref 30.0–36.0)
MCV: 82.5 fL (ref 80.0–100.0)
Platelets: 322 10*3/uL (ref 150–400)
RBC: 4.45 MIL/uL (ref 3.87–5.11)
RDW: 13.6 % (ref 11.5–15.5)
WBC: 10.5 10*3/uL (ref 4.0–10.5)
nRBC: 0 % (ref 0.0–0.2)

## 2021-12-09 LAB — GLUCOSE, CAPILLARY: Glucose-Capillary: 110 mg/dL — ABNORMAL HIGH (ref 70–99)

## 2021-12-09 LAB — HEPARIN LEVEL (UNFRACTIONATED): Heparin Unfractionated: 0.35 IU/mL (ref 0.30–0.70)

## 2021-12-09 LAB — T4, FREE: Free T4: 1.27 ng/dL — ABNORMAL HIGH (ref 0.61–1.12)

## 2021-12-09 MED ORDER — ORAL CARE MOUTH RINSE
15.0000 mL | Freq: Two times a day (BID) | OROMUCOSAL | Status: DC
  Administered 2021-12-09 – 2021-12-21 (×19): 15 mL via OROMUCOSAL

## 2021-12-09 MED ORDER — ALBUTEROL SULFATE (2.5 MG/3ML) 0.083% IN NEBU
2.5000 mg | INHALATION_SOLUTION | RESPIRATORY_TRACT | Status: DC | PRN
  Administered 2021-12-09 – 2021-12-14 (×9): 2.5 mg via RESPIRATORY_TRACT
  Filled 2021-12-09 (×9): qty 3

## 2021-12-09 MED ORDER — FUROSEMIDE 10 MG/ML IJ SOLN
40.0000 mg | Freq: Two times a day (BID) | INTRAMUSCULAR | Status: DC
  Administered 2021-12-09 – 2021-12-11 (×5): 40 mg via INTRAVENOUS
  Filled 2021-12-09 (×5): qty 4

## 2021-12-09 MED ORDER — METOPROLOL TARTRATE 50 MG PO TABS
50.0000 mg | ORAL_TABLET | Freq: Two times a day (BID) | ORAL | Status: AC
  Administered 2021-12-09 – 2021-12-11 (×5): 50 mg via ORAL
  Filled 2021-12-09 (×5): qty 1

## 2021-12-09 NOTE — Progress Notes (Signed)
? ?  Pt is scheduled for TEE/DCCV 12/11/21 at 3 pm  orders will need to be placed if stable to proceed. ? ?Cecilie Kicks, FNP-C ?At [place] [facility] [place]  ?[phone] or after 5pm and on weekends call [phone] ?12/09/2021.  ? ?

## 2021-12-09 NOTE — Progress Notes (Signed)
Heart Failure Nurse Navigator Progress Note ? ?Following this hospitalization to assess for HV TOC readiness. Pending ECHO. ?Will attempt to interview tomorrow.  ? ?Pricilla Holm, MSN, RN ?Heart Failure Nurse Navigator ?[phone] ? ?

## 2021-12-09 NOTE — TOC Benefit Eligibility Note (Signed)
Patient Advocate Encounter ?  ?Insurance verification completed.   ?  ?The patient is currently admitted and upon discharge could be taking ELIQUIS '5MG'$ . ?  ?The current 30 day co-pay is, $47.  ? ?The patient is insured through [employer]. ? ? ?  ? ? ? ? ? ? ? ? ?

## 2021-12-09 NOTE — Progress Notes (Signed)
ANTICOAGULATION CONSULT NOTE  ? ?Pharmacy Consult for heparin ?Indication: atrial fibrillation ? ?No Known Allergies ? ?Patient Measurements: ?Height: 5' 6.5" (168.9 cm) ?Weight: 94.3 kg (207 lb 14.3 oz) ?IBW/kg (Calculated) : 60.45 ?Heparin Dosing Weight: 81kg ? ?Vital Signs: ?Temp: 98.4 ?F (36.9 ?C) (03/06 0416) ?Temp Source: Oral (03/06 0416) ?BP: 138/93 (03/06 0416) ?Pulse Rate: 102 (03/06 0416) ? ?Labs: ?Recent Labs  ?  12/08/21 ?1158 12/08/21 ?2153 12/09/21 ?1517  ?HGB 11.9*  --  12.3  ?HCT 36.5  --  36.7  ?PLT 323  --  322  ?HEPARINUNFRC  --  0.38 0.35  ?CREATININE 0.70  --  0.86  ?TROPONINIHS 6  --   --   ? ? ?Estimated Creatinine Clearance: 52.8 mL/min (by C-G formula based on SCr of 0.86 mg/dL). ? ? ?Medical History: ?Past Medical History:  ?Diagnosis Date  ? Dyspnea   ? diastolic dysfunction by echo 2012  ? GERD (gastroesophageal reflux disease)   ? Hypertension   ? Mild mitral and aortic regurgitation   ? Osteoarthritis   ? Right bundle branch block 2014  ? Thyroid disease   ? ? ?Assessment: ?[name] presenting with SOB, in Afib with RVR. No anticoagulation prior to admission. Pharmacy consulted for heparin.   ? ?Heparin level 0.35 is therapeutic (at low end of goal) on 1150 units/hr. Planning TEE and DCCV soon and eventual transition to [place].  ? ?Goal of Therapy:  ?Heparin level 0.3-0.7 units/ml ?Monitor platelets by anticoagulation protocol: Yes ?  ?Plan:  ?Increase heparin slightly to 1200 units/hr to prevent drop to subtherapeutic level ?Monitor daily heparin level, CBC ?Monitor for signs/symptoms of bleeding  ?F/u transition to [place]  ? ? ?Benetta Spar, PharmD, BCPS, BCCP ?Clinical Pharmacist ? ?Please check AMION for all [place] phone numbers ?After 10:00 PM, call [place] [phone] ? ?

## 2021-12-09 NOTE — Assessment & Plan Note (Addendum)
SP cardioversion recovered sinus rhythm. ?Continue with metoprolol and anticoagulation with apixaban.  ?  ? ?  ?

## 2021-12-09 NOTE — Progress Notes (Signed)
With on and off expiratory wheezing, encouraged to use flutter valve, uses it with proper  technique. Continue to monitor. ?

## 2021-12-09 NOTE — Assessment & Plan Note (Addendum)
Echocardiogram with LV EF 45% with global hypokinesis, mild reduction of RV systolic function, RSVP 73,4. Moderate mitral regurgitation. Mild dilatation of ascending aorta.   Urine output is 1.025 cc over last 24 hrs with improvement in her symptoms.  Systolic blood pressure 287 to 122 mmHg  Warm lower extremities.   Heart failure management with entresto and metoprolol.  Transition to torsemide   Acute hypoxemic respiratory failure, continue with supplemental 02 per , diuresis for cardiogenic pulmonary edema. Continue with inhaled corticosteroids, will need outpatient pulmonary function testing.

## 2021-12-09 NOTE — Progress Notes (Signed)
?  Echocardiogram ?2D Echocardiogram has been performed. ? ?Fidel Levy ?12/09/2021, 4:02 PM ?

## 2021-12-09 NOTE — Progress Notes (Signed)
Nurse called Probation officer to report she had been in room and very noticeable auditory wheeze at bedside. ? ?Writer entered room, could hear wheeze, change from 30 minutes prior. Nebulizer given, flutter valve given, and repositioned with 45? HOB. ? ?After, slight improvement noted. Upon recheck in 30 minutes, patient asleep with increased work of breathing noted from earlier when asleep. Subtle soft audible wheeze could still be heard in sleep.  ? Conley Canal, cardiology paged, awaiting response.  ? ?Continue to monitor. ?

## 2021-12-09 NOTE — Evaluation (Signed)
Physical Therapy Evaluation ?Patient Details ?Name: [name] ?MRN: [id number] ?DOB: [birth year] ?Today's Date: 12/09/2021 ? ?History of Present Illness ? Patient is a 88 y/o female who presents on 12/08/21 with SOB. Found to have new onset A-fib and acute respiratory distress secondary to CHF exacerbation and SIRS. CXR-bilateral interstitial thickening concerning for edema versus pneumonia in the left lower lobePMH includes HTN and RBBB., mild AR/MR.  ?Clinical Impression ? Patient presents with generalized weakness, dyspnea on exertion, decreased activity tolerance and impaired mobility s/p above. Pt lives at home with spouse and reports caring for him PTA. Pt independent for ADLs and does some cooking PTA. Today, pt requires Min A for all mobility and requires use of RW for support. RR up to 40s, HR up to 120s bpm in A-fib and SP02 dropped to 87% on 2L/min 02 [place] with activity. Noted to have some edema in BLEs and into feet. Has supportive daughter. Will follow acutely to maximize independence and mobility prior to return home.   ?   ? ?Recommendations for follow up therapy are one component of a multi-disciplinary discharge planning process, led by the attending physician.  Recommendations may be updated based on patient status, additional functional criteria and insurance authorization. ? ?Follow Up Recommendations Home health PT ? ?  ?Assistance Recommended at Discharge Intermittent Supervision/Assistance  ?Patient can return home with the following ? Help with stairs or ramp for entrance;A little help with walking and/or transfers;A little help with bathing/dressing/bathroom;Assistance with cooking/housework ? ?  ?Equipment Recommendations None recommended by PT  ?Recommendations for Other Services ?    ?  ?Functional Status Assessment Patient has had a recent decline in their functional status and demonstrates the ability to make significant improvements in function in a reasonable and predictable amount of  time.  ? ?  ?Precautions / Restrictions Precautions ?Precautions: Fall;Other (comment) ?Precaution Comments: watch HR/02 ?Restrictions ?Weight Bearing Restrictions: No  ? ?  ? ?Mobility ? Bed Mobility ?Overal bed mobility: Needs Assistance ?Bed Mobility: Rolling, Sidelying to Sit ?Rolling: Min guard ?Sidelying to sit: Min assist, HOB elevated ?  ?  ?  ?General bed mobility comments: Min A to elevate trunk and use of rail. ?  ? ?Transfers ?Overall transfer level: Needs assistance ?Equipment used: Rolling walker (2 wheels) ?Transfers: Sit to/from Stand ?Sit to Stand: Min assist ?  ?  ?  ?  ?  ?General transfer comment: Min A to power to standing from EOB x2, from chair x2. ?  ? ?Ambulation/Gait ?Ambulation/Gait assistance: Min guard ?Gait Distance (Feet): 100 Feet ?Assistive device: Rolling walker (2 wheels) ?Gait Pattern/deviations: Step-through pattern, Decreased stride length, Trunk flexed ?Gait velocity: decreased ?Gait velocity interpretation: 1.31 - 2.62 ft/sec, indicative of limited community ambulator ?  ?General Gait Details: Slow, mostly steady gait with cues for RW management esp with turns, Sp02 dropped to 87% on 2L/min 02 [place] with HR up to 120s bpm, A-fib. RR in 40s. 1 standing rest break. ? ?Stairs ?  ?  ?  ?  ?  ? ?Wheelchair Mobility ?  ? ?Modified Rankin (Stroke Patients Only) ?  ? ?  ? ?Balance Overall balance assessment: Needs assistance ?Sitting-balance support: Feet supported, No upper extremity supported ?Sitting balance-Leahy Scale: Fair ?  ?  ?Standing balance support: During functional activity ?Standing balance-Leahy Scale: Poor ?Standing balance comment: Requires UE support. Able to perform pericare in standing with min guard ?  ?  ?  ?  ?  ?  ?  ?  ?  ?  ?  ?   ? ? ? ?  Pertinent Vitals/Pain Pain Assessment ?Pain Assessment: No/denies pain  ? ? ?Home Living Family/patient expects to be discharged to:: Private residence ?Living Arrangements: Spouse/significant other ?Available Help at  Discharge: Family;Available 24 hours/day ?Type of Home: House ?Home Access: Stairs to enter ?  ?Entrance Stairs-Number of Steps: 1 ?  ?Home Layout: One level ?Home Equipment: BSC/3in1;Shower seat - built in;Rollator (4 wheels);Rolling Walker (2 wheels) ?   ?  ?Prior Function Prior Level of Function : Independent/Modified Independent ?  ?  ?  ?  ?  ?  ?Mobility Comments: Independent, does IADLs, has a cleaning lady once/month. has been helping care for spouse ?ADLs Comments: independent ?  ? ? ?Hand Dominance  ? Dominant Hand: Right ? ?  ?Extremity/Trunk Assessment  ? Upper Extremity Assessment ?Upper Extremity Assessment: Defer to OT evaluation ?  ? ?Lower Extremity Assessment ?Lower Extremity Assessment: Generalized weakness (but functional) ?  ? ?   ?Communication  ? Communication: No difficulties  ?Cognition Arousal/Alertness: Awake/alert ?Behavior During Therapy: [facility] for tasks assessed/performed ?Overall Cognitive Status: Within Functional Limits for tasks assessed ?  ?  ?  ?  ?  ?  ?  ?  ?  ?  ?  ?  ?  ?  ?  ?  ?  ?  ?  ? ?  ?General Comments General comments (skin integrity, edema, etc.): Sp02 stayed >87% on 2L/min 02 [place] with activity, RR up to 40s with walking. ? ?  ?Exercises    ? ?Assessment/Plan  ?  ?PT Assessment Patient needs continued PT services  ?PT Problem List Decreased strength;Decreased mobility;Decreased balance;Decreased activity tolerance;Cardiopulmonary status limiting activity ? ?   ?  ?PT Treatment Interventions Patient/family education;Therapeutic activities;Therapeutic exercise;DME instruction;Balance training;Gait training;Functional mobility training   ? ?PT Goals (Current goals can be found in the Care Plan section)  ?Acute Rehab PT Goals ?Patient Stated Goal: to go home ?PT Goal Formulation: With patient ?Time For Goal Achievement: 12/23/21 ?Potential to Achieve Goals: Good ? ?  ?Frequency Min 3X/week ?  ? ? ?Co-evaluation   ?  ?  ?  ?  ? ? ?  ?AM-PAC PT "6 Clicks" Mobility  ?Outcome  Measure Help needed turning from your back to your side while in a flat bed without using bedrails?: A Little ?Help needed moving from lying on your back to sitting on the side of a flat bed without using bedrails?: A Little ?Help needed moving to and from a bed to a chair (including a wheelchair)?: A Little ?Help needed standing up from a chair using your arms (e.g., wheelchair or bedside chair)?: A Little ?Help needed to walk in hospital room?: A Little ?Help needed climbing 3-5 steps with a railing? : A Lot ?6 Click Score: 17 ? ?  ?End of Session Equipment Utilized During Treatment: Oxygen;Gait belt ?Activity Tolerance: Patient tolerated treatment well ?Patient left: in chair;with call bell/phone within reach ?Nurse Communication: Mobility status ?PT Visit Diagnosis: Muscle weakness (generalized) (M62.81);Difficulty in walking, not elsewhere classified (R26.2) ?  ? ?Time: 6967-8938 ?PT Time Calculation (min) (ACUTE ONLY): 26 min ? ? ?Charges:   PT Evaluation ?$PT Eval Moderate Complexity: 1 Mod ?PT Treatments ?$Gait Training: 8-22 mins ?  ?   ? ? ?Marisa Severin, PT, DPT ?Acute Rehabilitation Services ?Pager [phone] ?Office [phone] ? ? ? ? ?[place] ?12/09/2021, 11:19 AM ? ?

## 2021-12-09 NOTE — Progress Notes (Signed)
PROGRESS NOTE    [name]  IEP:[id number] DOB: August 08, 1931 DOA: 12/08/2021 PCP: Jani Gravel, MD  Narrative 88/F with history of hypertension dyslipidemia, mild AR, MR, hypothyroidism and GERD presented to the ED 3/5 with acute onset dyspnea.  Patient reported progressive shortness of breath with activity for the last week, also noticed palpitations. -In the ED she was noted to be in A-fib HR- 120s, and CHF.  Chest x-ray noted bilateral interstitial thickening concerning for edema versus pneumonia in the left lower lobe  Subjective: -Starting to feel better, heart rate remains elevated  Assessment and Plan:  Acute CHF (congestive heart failure) ([place]) -Admitted with fluid overload, - last echocardiogram noted EF of 50 to 55% with mild aortic and mitral valve regurgitation -Continue IV Lasix, increased dose to 40 Mg twice daily -Monitor I's/O, daily weights  Paroxysmal atrial fibrillation with RVR ([place]) - New onset A-fib with heart rates into the 110s.  -CHA2DS2-VASc score = at least 4.  Continue heparin gtt. -Cardiology consulting, plan for TEE/cardioversion today  -Continue p.o. metoprolol  SIRS (systemic inflammatory response syndrome) ([facility]) - Clinically do not suspect pneumonia, chest x-ray findings are suggestive of CHF and atelectasis -Afebrile, no leukocytosis, lactate and procalcitonin are normal -discontinue antibiotics and monitor  Hypothyroidism Home medication regimen includes levothyroxine 150 to 225 mcg daily. -We will clarify home dose, TSH is mildly elevated   Hyponatremia - Likely secondary to CHF, monitor with diuresis  Normocytic anemia - Hemoglobin normal on recheck   DVT prophylaxis: IV heparin Code Status: Full code Family Communication: Discussed patient in detail, no family at bedside Disposition Plan: Home likely 48 hours   Consultants:  Cardiology  Procedures:   Antimicrobials:    Objective: Vitals:   12/09/21 0020 12/09/21 0116  12/09/21 0416 12/09/21 0532  BP: (!) 129/92 (!) 152/90 (!) 138/93   Pulse: (!) 102 97 (!) 102   Resp: 20  20   Temp: 98.1 F (36.7 C)  98.4 F (36.9 C)   TempSrc: Oral  Oral   SpO2: 93%  94% 93%  Weight:   94.3 kg   Height:        Intake/Output Summary (Last 24 hours) at 12/09/2021 6606 Last data filed at 12/09/2021 0419 Gross per 24 hour  Intake 0 ml  Output 2025 ml  Net -2025 ml   Filed Weights   12/08/21 1114 12/09/21 0416  Weight: 94.3 kg 94.3 kg    Examination:  General exam: Pleasant obese 88-year-old female sitting up in bed, AAOx3, no distress HEENT: Positive JVD CVS: S1-S2, irregularly irregular rhythm Lungs: Fine basilar rales Abdomen: Soft, nontender, bowel sounds present Extremities: 1+ edema  Skin: No rashes Psychiatry: Judgement and insight appear normal. Mood & affect appropriate.     Data Reviewed:   CBC: Recent Labs  Lab 12/08/21 1158 12/09/21 0558  WBC 8.7 10.5  NEUTROABS 6.6  --   HGB 11.9* 12.3  HCT 36.5 36.7  MCV 82.8 82.5  PLT 323 301   Basic Metabolic Panel: Recent Labs  Lab 12/08/21 1158 12/08/21 1648 12/09/21 0558  NA 125*  --  126*  K 4.1  --  3.8  CL 93*  --  92*  CO2 23  --  25  GLUCOSE 102*  --  109*  BUN 13  --  10  CREATININE 0.70  --  0.86  CALCIUM 8.9  --  8.7*  MG  --  1.9  --    GFR: Estimated Creatinine Clearance: 52.8 mL/min (  by C-G formula based on SCr of 0.86 mg/dL). Liver Function Tests: Recent Labs  Lab 12/08/21 1158  AST 18  ALT 15  ALKPHOS 81  BILITOT 0.5  PROT 6.8  ALBUMIN 4.0   No results for input(s): LIPASE, AMYLASE in the last 168 hours. No results for input(s): AMMONIA in the last 168 hours. Coagulation Profile: No results for input(s): INR, PROTIME in the last 168 hours. Cardiac Enzymes: No results for input(s): CKTOTAL, CKMB, CKMBINDEX, TROPONINI in the last 168 hours. BNP (last 3 results) No results for input(s): PROBNP in the last 8760 hours. HbA1C: Recent Labs    12/08/21 1836   HGBA1C 4.9   CBG: Recent Labs  Lab 12/08/21 2119 12/09/21 0614  GLUCAP 119* 110*   Lipid Profile: No results for input(s): CHOL, HDL, LDLCALC, TRIG, CHOLHDL, LDLDIRECT in the last 72 hours. Thyroid Function Tests: Recent Labs    12/08/21 1648  TSH 7.190*   Anemia Panel: No results for input(s): VITAMINB12, FOLATE, FERRITIN, TIBC, IRON, RETICCTPCT in the last 72 hours. Urine analysis: No results found for: COLORURINE, APPEARANCEUR, LABSPEC, PHURINE, GLUCOSEU, HGBUR, BILIRUBINUR, KETONESUR, PROTEINUR, UROBILINOGEN, NITRITE, LEUKOCYTESUR Sepsis Labs: '@LABRCNTIP'$ (procalcitonin:4,lacticidven:4)  ) Recent Results (from the past 240 hour(s))  Blood culture (routine x 2)     Status: None (Preliminary result)   Collection Time: 12/08/21 11:07 AM   Specimen: BLOOD  Result Value Ref Range Status   Specimen Description   Final    BLOOD BLOOD RIGHT FOREARM Performed at [facility], [address], [place], [place] [address]    Special Requests   Final    BOTTLES DRAWN AEROBIC AND ANAEROBIC Blood Culture adequate volume Performed at [facility], [address], [place], [place] [address]    Culture   Final    NO GROWTH < 24 HOURS Performed at [facility] [address] [place], [place] [address]    Report Status PENDING  Incomplete  Resp Panel by RT-PCR (Flu A&B, Covid) Nasopharyngeal Swab     Status: None   Collection Time: 12/08/21 12:05 PM   Specimen: Nasopharyngeal Swab; Nasopharyngeal(NP) swabs in vial transport medium  Result Value Ref Range Status   SARS Coronavirus 2 by RT PCR NEGATIVE NEGATIVE Final    Comment: (NOTE) SARS-CoV-2 target nucleic acids are NOT DETECTED.  The SARS-CoV-2 RNA is generally detectable in upper respiratory specimens during the acute phase of infection. The lowest concentration of SARS-CoV-2 viral copies this assay can detect is 138 copies/mL. A negative result does not preclude  SARS-Cov-2 infection and should not be used as the sole basis for treatment or other patient management decisions. A negative result may occur with  improper specimen collection/handling, submission of specimen other than nasopharyngeal swab, presence of viral mutation(s) within the areas targeted by this assay, and inadequate number of viral copies(<138 copies/mL). A negative result must be combined with clinical observations, patient history, and epidemiological information. The expected result is Negative.  Fact Sheet for Patients:  EntrepreneurPulse.com.au  Fact Sheet for Healthcare Providers:  IncredibleEmployment.be  This test is no t yet approved or cleared by the [place] FDA and  has been authorized for detection and/or diagnosis of SARS-CoV-2 by FDA under an Emergency Use Authorization (EUA). This EUA will remain  in effect (meaning this test can be used) for the duration of the COVID-19 declaration under Section 564(b)(1) of the Act, 21 U.S.C.section 360bbb-3(b)(1), unless the authorization is terminated  or revoked sooner.  Influenza A by PCR NEGATIVE NEGATIVE Final   Influenza B by PCR NEGATIVE NEGATIVE Final    Comment: (NOTE) The Xpert Xpress SARS-CoV-2/FLU/RSV plus assay is intended as an aid in the diagnosis of influenza from Nasopharyngeal swab specimens and should not be used as a sole basis for treatment. Nasal washings and aspirates are unacceptable for Xpert Xpress SARS-CoV-2/FLU/RSV testing.  Fact Sheet for Patients: EntrepreneurPulse.com.au  Fact Sheet for Healthcare Providers: IncredibleEmployment.be  This test is not yet approved or cleared by the [place] FDA and has been authorized for detection and/or diagnosis of SARS-CoV-2 by FDA under an Emergency Use Authorization (EUA). This EUA will remain in effect (meaning this test can be used) for the duration of  the COVID-19 declaration under Section 564(b)(1) of the Act, 21 U.S.C. section 360bbb-3(b)(1), unless the authorization is terminated or revoked.  Performed at [facility], [address] [place], [place] [address]   Blood culture (routine x 2)     Status: None (Preliminary result)   Collection Time: 12/08/21 12:05 PM   Specimen: BLOOD  Result Value Ref Range Status   Specimen Description   Final    BLOOD LEFT ANTECUBITAL Performed at [facility], [address], [place], [place] [address]    Special Requests   Final    BOTTLES DRAWN AEROBIC AND ANAEROBIC Blood Culture adequate volume Performed at [facility], [address], [place], [place] [address]    Culture   Final    NO GROWTH < 24 HOURS Performed at [facility], [place] [address], [place], [place] [address]    Report Status PENDING  Incomplete  MRSA Next Gen by PCR, Nasal     Status: None   Collection Time: 12/08/21  4:24 PM   Specimen: Nasal Mucosa; Nasal Swab  Result Value Ref Range Status   MRSA by PCR Next Gen NOT DETECTED NOT DETECTED Final    Comment: (NOTE) The GeneXpert MRSA Assay (FDA approved for NASAL specimens only), is one component of a comprehensive MRSA colonization surveillance program. It is not intended to diagnose MRSA infection nor to guide or monitor treatment for MRSA infections. Test performance is not FDA approved in patients less than 25 years old. Performed at [facility], [place] [address], [place], [place] [address]      Radiology Studies: DG Chest Port 1 View  Result Date: 12/08/2021 CLINICAL DATA:  Shortness of breath EXAM: PORTABLE CHEST 1 VIEW COMPARISON:  None. FINDINGS: Bilateral reticulonodular interstitial thickening. Left lower lobe retrocardiac airspace disease. Small left pleural effusion. No pneumothorax. Mild cardiomegaly. No acute osseous abnormality. Multiple left posterior rib fractures. IMPRESSION:  1. Bilateral interstitial thickening which may reflect interstitial edema versus pneumonia including atypical viral etiologies. 2. Left lower lobe retrocardiac airspace disease which may reflect atelectasis versus pneumonia. Electronically Signed   By: Kathreen Devoid M.D.   On: 12/08/2021 11:40     Scheduled Meds:  aspirin EC  81 mg Oral Daily   FLUoxetine  40 mg Oral q morning   [START ON 12/12/2021] levothyroxine  150 mcg Oral Once per day on Thu Fri Sat   levothyroxine  225 mcg Oral Once per day on Sun Mon Tue Wed   mouth rinse  15 mL Mouth Rinse BID   metoprolol tartrate  12.5 mg Oral Q6H   pravastatin  40 mg Oral q1800   sodium chloride flush  3 mL Intravenous Q12H   vitamin B-1  125 mg Oral QPM  Continuous Infusions:  sodium chloride     cefTRIAXone (ROCEPHIN)  IV     heparin 1,200 Units/hr (12/09/21 0747)     LOS: 1 day    Time spent: 28mn    PDomenic Polite MD [facility]   12/09/2021, 9:39 AM

## 2021-12-09 NOTE — Progress Notes (Signed)
? ?Progress Note ? ?Patient Name: [name] ?Date of Encounter: 12/09/2021 ? ?[facility] Cardiologist: None  ? ?Subjective  ? ?Feeling better.  Breathing is improving.  She is still unable to lie flat in bed.  She noted she was less dyspneic on her walk this morning. ? ?Inpatient Medications  ?  ?Scheduled Meds: ? aspirin EC  81 mg Oral Daily  ? FLUoxetine  40 mg Oral q morning  ? furosemide  40 mg Intravenous BID  ? [START ON 12/12/2021] levothyroxine  150 mcg Oral Once per day on Thu Fri Sat  ? levothyroxine  225 mcg Oral Once per day on Sun Mon Tue Wed  ? mouth rinse  15 mL Mouth Rinse BID  ? metoprolol tartrate  12.5 mg Oral Q6H  ? pravastatin  40 mg Oral q1800  ? sodium chloride flush  3 mL Intravenous Q12H  ? vitamin B-1  125 mg Oral QPM  ? ?Continuous Infusions: ? sodium chloride    ? heparin 1,200 Units/hr (12/09/21 0947)  ? ?PRN Meds: ?sodium chloride, acetaminophen, albuterol, fluticasone, LORazepam, ondansetron (ZOFRAN) IV, sodium chloride flush  ? ?Vital Signs  ?  ?Vitals:  ? 12/09/21 0020 12/09/21 0116 12/09/21 0416 12/09/21 0532  ?BP: (!) 129/92 (!) 152/90 (!) 138/93   ?Pulse: (!) 102 97 (!) 102   ?Resp: 20  20   ?Temp: 98.1 ?F (36.7 ?C)  98.4 ?F (36.9 ?C)   ?TempSrc: Oral  Oral   ?SpO2: 93%  94% 93%  ?Weight:   94.3 kg   ?Height:      ? ? ?Intake/Output Summary (Last 24 hours) at 12/09/2021 0959 ?Last data filed at 12/09/2021 0419 ?Gross per 24 hour  ?Intake 0 ml  ?Output 2025 ml  ?Net -2025 ml  ? ?Last 3 Weights 12/09/2021 12/08/2021  ?Weight (lbs) 207 lb 14.3 oz 208 lb  ?Weight (kg) 94.3 kg 94.348 kg  ?   ? ?Telemetry  ?  ?Atrial fibrillation.  Rate 110s.- Personally Reviewed ? ?ECG  ?  ? Atrial fibrillation.  Rate 115.  Right bundle branch block.- Personally Reviewed ? ?Physical Exam  ? ?VS:  BP (!) 138/93 (BP Location: Right Arm)   Pulse (!) 102   Temp 98.4 ?F (36.9 ?C) (Oral)   Resp 20   Ht 5' 6.5" (1.689 m)   Wt 94.3 kg   SpO2 93%   BMI 33.05 kg/m?  , BMI Body mass index is 33.05  kg/m?. ?GENERAL:  Well appearing ?HEENT: Pupils equal round and reactive, fundi not visualized, oral mucosa unremarkable ?NECK:  No jugular venous distention, waveform within normal limits, carotid upstroke brisk and symmetric, no bruits, no thyromegaly ?LUNGS: Crackles at the right base. ?HEART: Tachycardic.  Irregularly irregular.  PMI not displaced or sustained,S1 and S2 within normal limits, no S3, no S4, no clicks, no rubs, no murmurs ?ABD:  Flat, positive bowel sounds normal in frequency in pitch, no bruits, no rebound, no guarding, no midline pulsatile mass, no hepatomegaly, no splenomegaly ?EXT:  2 plus pulses throughout, 1+ lower extremity edema to the tibia bilaterally, no cyanosis no clubbing ?SKIN:  No rashes no nodules ?NEURO:  Cranial nerves II through XII grossly intact, motor grossly intact throughout ?PSYCH:  Cognitively intact, oriented to person place and time ? ?Labs  ?  ?High Sensitivity Troponin:   ?Recent Labs  ?Lab 12/08/21 ?1158  ?TROPONINIHS 6  ?   ?Chemistry ?Recent Labs  ?Lab 12/08/21 ?1158 12/08/21 ?1648 12/09/21 ?0092  ?NA 125*  --  126*  ?K 4.1  --  3.8  ?CL 93*  --  92*  ?CO2 23  --  25  ?GLUCOSE 102*  --  109*  ?BUN 13  --  10  ?CREATININE 0.70  --  0.86  ?CALCIUM 8.9  --  8.7*  ?MG  --  1.9  --   ?PROT 6.8  --   --   ?ALBUMIN 4.0  --   --   ?AST 18  --   --   ?ALT 15  --   --   ?ALKPHOS 81  --   --   ?BILITOT 0.5  --   --   ?GFRNONAA >60  --  >60  ?ANIONGAP 9  --  9  ?  ?Lipids No results for input(s): CHOL, TRIG, HDL, LABVLDL, LDLCALC, CHOLHDL in the last 168 hours.  ?Hematology ?Recent Labs  ?Lab 12/08/21 ?1158 12/09/21 ?3419  ?WBC 8.7 10.5  ?RBC 4.41 4.45  ?HGB 11.9* 12.3  ?HCT 36.5 36.7  ?MCV 82.8 82.5  ?MCH 27.0 27.6  ?MCHC 32.6 33.5  ?RDW 13.7 13.6  ?PLT 323 322  ? ?Thyroid  ?Recent Labs  ?Lab 12/08/21 ?1648  ?TSH 7.190*  ?  ?BNP ?Recent Labs  ?Lab 12/08/21 ?1158  ?BNP 453.8*  ?  ?DDimer  ?Recent Labs  ?Lab 12/08/21 ?1158  ?DDIMER 0.80*  ?  ? ?Radiology  ?  ?DG Chest Port 1  View ? ?Result Date: 12/08/2021 ?CLINICAL DATA:  Shortness of breath EXAM: PORTABLE CHEST 1 VIEW COMPARISON:  None. FINDINGS: Bilateral reticulonodular interstitial thickening. Left lower lobe retrocardiac airspace disease. Small left pleural effusion. No pneumothorax. Mild cardiomegaly. No acute osseous abnormality. Multiple left posterior rib fractures. IMPRESSION: 1. Bilateral interstitial thickening which may reflect interstitial edema versus pneumonia including atypical viral etiologies. 2. Left lower lobe retrocardiac airspace disease which may reflect atelectasis versus pneumonia. Electronically Signed   By: Kathreen Devoid M.D.   On: 12/08/2021 11:40   ? ?Cardiac Studies  ? ?Echo 03/04/16 ?- Left ventricle: The cavity size was normal. Wall thickness was  ?  normal. Systolic function was normal. The estimated ejection  ?  fraction was in the range of 50% to 55%.  ?- Aortic valve: Calcified non coronary cusp. There was trivial  ?  regurgitation.  ?- Mitral valve: There was mild regurgitation.  ?- Left atrium: The atrium was mildly dilated.  ?- Atrial septum: No defect or patent foramen ovale was identified.  ? ?Patient Profile  ?   ?88 y.o. female with hypertension, hyperlipidemia, and newly diagnosed atrial fibrillation. ? ?Assessment & Plan  ?  ?#Paroxysmal atrial fibrillation: ?Newly noted this admission.  She reports 3 weeks of exertional dyspnea and edema.  I suspect this is the timeline of her new onset atrial fibrillation.  Rates remain poorly controlled.  Will increase metoprolol to 50 mg twice daily.  Echocardiogram pending.  Agree with planning for restoration of sinus rhythm prior to discharge.  However, she remains volume overloaded.  We will continue with diuresis first.  If her systolic function is normal on echo we will transition to oral anticoagulation today.  Continue IV heparin for now.  TSH was elevated.  We will check T3 and free T4.  Tentatively plan for TEE/DCCV on Wednesday. ? ?#Acute heart  failure, type unknown: ?BNP elevated on admission and she is volume overloaded.  I suspect this is all in the setting of new onset atrial fibrillation.  However echo is pending.  Thyroid function needs  to be more fully assessed as above.  She also has hyponatremia.  Likely from volume overload.  However there has not been much change in her sodium despite being net -2 L overnight.  Her fluoxetine could also contribute.  If she remains hyponatremic once more euvolemic, will need to consider changing her fluoxetine.  ? ?   ? ?For questions or updates, please contact [place] ?Please consult [email or web address] for contact info under  ? ?  ?   ?Signed, ?[name], MD  ?12/09/2021, 9:59 AM   ? ?

## 2021-12-09 NOTE — H&P (View-Only) (Signed)
? ?Progress Note ? ?Patient Name: [name] ?Date of Encounter: 12/09/2021 ? ?[facility] Cardiologist: None  ? ?Subjective  ? ?Feeling better.  Breathing is improving.  She is still unable to lie flat in bed.  She noted she was less dyspneic on her walk this morning. ? ?Inpatient Medications  ?  ?Scheduled Meds: ? aspirin EC  81 mg Oral Daily  ? FLUoxetine  40 mg Oral q morning  ? furosemide  40 mg Intravenous BID  ? [START ON 12/12/2021] levothyroxine  150 mcg Oral Once per day on Thu Fri Sat  ? levothyroxine  225 mcg Oral Once per day on Sun Mon Tue Wed  ? mouth rinse  15 mL Mouth Rinse BID  ? metoprolol tartrate  12.5 mg Oral Q6H  ? pravastatin  40 mg Oral q1800  ? sodium chloride flush  3 mL Intravenous Q12H  ? vitamin B-1  125 mg Oral QPM  ? ?Continuous Infusions: ? sodium chloride    ? heparin 1,200 Units/hr (12/09/21 0947)  ? ?PRN Meds: ?sodium chloride, acetaminophen, albuterol, fluticasone, LORazepam, ondansetron (ZOFRAN) IV, sodium chloride flush  ? ?Vital Signs  ?  ?Vitals:  ? 12/09/21 0020 12/09/21 0116 12/09/21 0416 12/09/21 0532  ?BP: (!) 129/92 (!) 152/90 (!) 138/93   ?Pulse: (!) 102 97 (!) 102   ?Resp: 20  20   ?Temp: 98.1 ?F (36.7 ?C)  98.4 ?F (36.9 ?C)   ?TempSrc: Oral  Oral   ?SpO2: 93%  94% 93%  ?Weight:   94.3 kg   ?Height:      ? ? ?Intake/Output Summary (Last 24 hours) at 12/09/2021 0959 ?Last data filed at 12/09/2021 0419 ?Gross per 24 hour  ?Intake 0 ml  ?Output 2025 ml  ?Net -2025 ml  ? ?Last 3 Weights 12/09/2021 12/08/2021  ?Weight (lbs) 207 lb 14.3 oz 208 lb  ?Weight (kg) 94.3 kg 94.348 kg  ?   ? ?Telemetry  ?  ?Atrial fibrillation.  Rate 110s.- Personally Reviewed ? ?ECG  ?  ? Atrial fibrillation.  Rate 115.  Right bundle branch block.- Personally Reviewed ? ?Physical Exam  ? ?VS:  BP (!) 138/93 (BP Location: Right Arm)   Pulse (!) 102   Temp 98.4 ?F (36.9 ?C) (Oral)   Resp 20   Ht 5' 6.5" (1.689 m)   Wt 94.3 kg   SpO2 93%   BMI 33.05 kg/m?  , BMI Body mass index is 33.05  kg/m?. ?GENERAL:  Well appearing ?HEENT: Pupils equal round and reactive, fundi not visualized, oral mucosa unremarkable ?NECK:  No jugular venous distention, waveform within normal limits, carotid upstroke brisk and symmetric, no bruits, no thyromegaly ?LUNGS: Crackles at the right base. ?HEART: Tachycardic.  Irregularly irregular.  PMI not displaced or sustained,S1 and S2 within normal limits, no S3, no S4, no clicks, no rubs, no murmurs ?ABD:  Flat, positive bowel sounds normal in frequency in pitch, no bruits, no rebound, no guarding, no midline pulsatile mass, no hepatomegaly, no splenomegaly ?EXT:  2 plus pulses throughout, 1+ lower extremity edema to the tibia bilaterally, no cyanosis no clubbing ?SKIN:  No rashes no nodules ?NEURO:  Cranial nerves II through XII grossly intact, motor grossly intact throughout ?PSYCH:  Cognitively intact, oriented to person place and time ? ?Labs  ?  ?High Sensitivity Troponin:   ?Recent Labs  ?Lab 12/08/21 ?1158  ?TROPONINIHS 6  ?   ?Chemistry ?Recent Labs  ?Lab 12/08/21 ?1158 12/08/21 ?1648 12/09/21 ?4270  ?NA 125*  --  126*  ?K 4.1  --  3.8  ?CL 93*  --  92*  ?CO2 23  --  25  ?GLUCOSE 102*  --  109*  ?BUN 13  --  10  ?CREATININE 0.70  --  0.86  ?CALCIUM 8.9  --  8.7*  ?MG  --  1.9  --   ?PROT 6.8  --   --   ?ALBUMIN 4.0  --   --   ?AST 18  --   --   ?ALT 15  --   --   ?ALKPHOS 81  --   --   ?BILITOT 0.5  --   --   ?GFRNONAA >60  --  >60  ?ANIONGAP 9  --  9  ?  ?Lipids No results for input(s): CHOL, TRIG, HDL, LABVLDL, LDLCALC, CHOLHDL in the last 168 hours.  ?Hematology ?Recent Labs  ?Lab 12/08/21 ?1158 12/09/21 ?6761  ?WBC 8.7 10.5  ?RBC 4.41 4.45  ?HGB 11.9* 12.3  ?HCT 36.5 36.7  ?MCV 82.8 82.5  ?MCH 27.0 27.6  ?MCHC 32.6 33.5  ?RDW 13.7 13.6  ?PLT 323 322  ? ?Thyroid  ?Recent Labs  ?Lab 12/08/21 ?1648  ?TSH 7.190*  ?  ?BNP ?Recent Labs  ?Lab 12/08/21 ?1158  ?BNP 453.8*  ?  ?DDimer  ?Recent Labs  ?Lab 12/08/21 ?1158  ?DDIMER 0.80*  ?  ? ?Radiology  ?  ?DG Chest Port 1  View ? ?Result Date: 12/08/2021 ?CLINICAL DATA:  Shortness of breath EXAM: PORTABLE CHEST 1 VIEW COMPARISON:  None. FINDINGS: Bilateral reticulonodular interstitial thickening. Left lower lobe retrocardiac airspace disease. Small left pleural effusion. No pneumothorax. Mild cardiomegaly. No acute osseous abnormality. Multiple left posterior rib fractures. IMPRESSION: 1. Bilateral interstitial thickening which may reflect interstitial edema versus pneumonia including atypical viral etiologies. 2. Left lower lobe retrocardiac airspace disease which may reflect atelectasis versus pneumonia. Electronically Signed   By: Kathreen Devoid M.D.   On: 12/08/2021 11:40   ? ?Cardiac Studies  ? ?Echo 03/04/16 ?- Left ventricle: The cavity size was normal. Wall thickness was  ?  normal. Systolic function was normal. The estimated ejection  ?  fraction was in the range of 50% to 55%.  ?- Aortic valve: Calcified non coronary cusp. There was trivial  ?  regurgitation.  ?- Mitral valve: There was mild regurgitation.  ?- Left atrium: The atrium was mildly dilated.  ?- Atrial septum: No defect or patent foramen ovale was identified.  ? ?Patient Profile  ?   ?88 y.o. female with hypertension, hyperlipidemia, and newly diagnosed atrial fibrillation. ? ?Assessment & Plan  ?  ?#Paroxysmal atrial fibrillation: ?Newly noted this admission.  She reports 3 weeks of exertional dyspnea and edema.  I suspect this is the timeline of her new onset atrial fibrillation.  Rates remain poorly controlled.  Will increase metoprolol to 50 mg twice daily.  Echocardiogram pending.  Agree with planning for restoration of sinus rhythm prior to discharge.  However, she remains volume overloaded.  We will continue with diuresis first.  If her systolic function is normal on echo we will transition to oral anticoagulation today.  Continue IV heparin for now.  TSH was elevated.  We will check T3 and free T4.  Tentatively plan for TEE/DCCV on Wednesday. ? ?#Acute heart  failure, type unknown: ?BNP elevated on admission and she is volume overloaded.  I suspect this is all in the setting of new onset atrial fibrillation.  However echo is pending.  Thyroid function needs  to be more fully assessed as above.  She also has hyponatremia.  Likely from volume overload.  However there has not been much change in her sodium despite being net -2 L overnight.  Her fluoxetine could also contribute.  If she remains hyponatremic once more euvolemic, will need to consider changing her fluoxetine.  ? ?   ? ?For questions or updates, please contact [place] ?Please consult [email or web address] for contact info under  ? ?  ?   ?Signed, ?[name], MD  ?12/09/2021, 9:59 AM   ? ?

## 2021-12-10 DIAGNOSIS — R0603 Acute respiratory distress: Secondary | ICD-10-CM | POA: Diagnosis not present

## 2021-12-10 DIAGNOSIS — I5041 Acute combined systolic (congestive) and diastolic (congestive) heart failure: Secondary | ICD-10-CM | POA: Diagnosis not present

## 2021-12-10 DIAGNOSIS — I4891 Unspecified atrial fibrillation: Secondary | ICD-10-CM | POA: Diagnosis not present

## 2021-12-10 LAB — BASIC METABOLIC PANEL
Anion gap: 10 (ref 5–15)
BUN: 10 mg/dL (ref 8–23)
CO2: 21 mmol/L — ABNORMAL LOW (ref 22–32)
Calcium: 8.2 mg/dL — ABNORMAL LOW (ref 8.9–10.3)
Chloride: 90 mmol/L — ABNORMAL LOW (ref 98–111)
Creatinine, Ser: 0.87 mg/dL (ref 0.44–1.00)
GFR, Estimated: >60 mL/min (ref >60)
Glucose, Bld: 111 mg/dL — ABNORMAL HIGH (ref 70–99)
Potassium: 3.2 mmol/L — ABNORMAL LOW (ref 3.5–5.1)
Sodium: 121 mmol/L — ABNORMAL LOW (ref 135–145)

## 2021-12-10 LAB — CBC
HCT: 34.2 % — ABNORMAL LOW (ref 36.0–46.0)
Hemoglobin: 11.4 g/dL — ABNORMAL LOW (ref 12.0–15.0)
MCH: 27.4 pg (ref 26.0–34.0)
MCHC: 33.3 g/dL (ref 30.0–36.0)
MCV: 82.2 fL (ref 80.0–100.0)
Platelets: 318 10*3/uL (ref 150–400)
RBC: 4.16 MIL/uL (ref 3.87–5.11)
RDW: 13.5 % (ref 11.5–15.5)
WBC: 10.4 10*3/uL (ref 4.0–10.5)
nRBC: 0 % (ref 0.0–0.2)

## 2021-12-10 LAB — HEPARIN LEVEL (UNFRACTIONATED): Heparin Unfractionated: 0.35 IU/mL (ref 0.30–0.70)

## 2021-12-10 LAB — T3: T3, Total: 58 ng/dL — ABNORMAL LOW (ref 71–180)

## 2021-12-10 MED ORDER — POTASSIUM CHLORIDE 10 MEQ/100ML IV SOLN
10.0000 meq | INTRAVENOUS | Status: DC
  Administered 2021-12-10 (×2): 10 meq via INTRAVENOUS
  Filled 2021-12-10 (×3): qty 100

## 2021-12-10 MED ORDER — POTASSIUM CHLORIDE CRYS ER 20 MEQ PO TBCR
40.0000 meq | EXTENDED_RELEASE_TABLET | Freq: Two times a day (BID) | ORAL | Status: AC
  Administered 2021-12-10 (×2): 40 meq via ORAL
  Filled 2021-12-10 (×2): qty 2

## 2021-12-10 MED ORDER — LEVALBUTEROL HCL 0.63 MG/3ML IN NEBU
0.6300 mg | INHALATION_SOLUTION | Freq: Three times a day (TID) | RESPIRATORY_TRACT | Status: DC
  Administered 2021-12-10 – 2021-12-12 (×9): 0.63 mg via RESPIRATORY_TRACT
  Filled 2021-12-10 (×10): qty 3

## 2021-12-10 MED ORDER — LEVOTHYROXINE SODIUM 100 MCG PO TABS
200.0000 ug | ORAL_TABLET | Freq: Every day | ORAL | Status: DC
  Administered 2021-12-11 – 2021-12-22 (×12): 200 ug via ORAL
  Filled 2021-12-10 (×12): qty 2

## 2021-12-10 NOTE — Progress Notes (Signed)
Progress Note  Patient Name: ADILENNE ASHWORTH Date of Encounter: 12/10/2021  [facility] Cardiologist: None   Subjective   She reports still being short of breath and wheezing.  Minimal improvement after breathing treatment.  Inpatient Medications    Scheduled Meds:  aspirin EC  81 mg Oral Daily   furosemide  40 mg Intravenous BID   levalbuterol  0.63 mg Nebulization TID   [START ON 12/12/2021] levothyroxine  150 mcg Oral Once per day on Thu Fri Sat   levothyroxine  225 mcg Oral Once per day on Sun Mon Tue Wed   mouth rinse  15 mL Mouth Rinse BID   metoprolol tartrate  50 mg Oral BID   potassium chloride  40 mEq Oral BID   pravastatin  40 mg Oral q1800   sodium chloride flush  3 mL Intravenous Q12H   vitamin B-1  125 mg Oral QPM   Continuous Infusions:  sodium chloride     heparin 1,250 Units/hr (12/10/21 0800)   PRN Meds: sodium chloride, acetaminophen, albuterol, fluticasone, LORazepam, ondansetron (ZOFRAN) IV, sodium chloride flush   Vital Signs    Vitals:   12/10/21 0317 12/10/21 0732 12/10/21 0809 12/10/21 0858  BP: 137/84   126/78  Pulse: 97 (!) 106 89 (!) 114  Resp: (!) 21 (!) 24 16   Temp: 98 F (36.7 C)     TempSrc: Oral     SpO2: 95% (!) 88% 96%   Weight: 94 kg     Height:        Intake/Output Summary (Last 24 hours) at 12/10/2021 0958 Last data filed at 12/10/2021 0800 Gross per 24 hour  Intake 459.9 ml  Output 1100 ml  Net -640.1 ml   Last 3 Weights 12/10/2021 12/09/2021 12/08/2021  Weight (lbs) 207 lb 3.7 oz 207 lb 14.3 oz 208 lb  Weight (kg) 94 kg 94.3 kg 94.348 kg      Telemetry    Atrial fibrillation.  Rate 90s to 100s.- Personally Reviewed  ECG     Atrial fibrillation.  Rate 115.  Right bundle branch block.- Personally Reviewed  Physical Exam   VS:  BP 126/78    Pulse (!) 114    Temp 98 F (36.7 C) (Oral)    Resp 16    Ht 5' 6.5" (1.689 m)    Wt 94 kg    SpO2 96%    BMI 32.95 kg/m  , BMI Body mass index is 32.95 kg/m. GENERAL:  Well  appearing HEENT: Pupils equal round and reactive, fundi not visualized, oral mucosa unremarkable NECK:  No jugular venous distention, waveform within normal limits, carotid upstroke brisk and symmetric, no bruits, no thyromegaly LUNGS: Diffuse expiratory wheezing.  No rhonchi or crackles. HEART: Tachycardic.  Irregularly irregular.  PMI not displaced or sustained,S1 and S2 within normal limits, no S3, no S4, no clicks, no rubs, no murmurs ABD:  Flat, positive bowel sounds normal in frequency in pitch, no bruits, no rebound, no guarding, no midline pulsatile mass, no hepatomegaly, no splenomegaly EXT:  2 plus pulses throughout, trace lower extremity edema, no cyanosis no clubbing SKIN:  No rashes no nodules NEURO:  Cranial nerves II through XII grossly intact, motor grossly intact throughout PSYCH:  Cognitively intact, oriented to person place and time  Labs    High Sensitivity Troponin:   Recent Labs  Lab 12/08/21 [place] 6     Chemistry Recent Labs  Lab 12/08/21 1158 12/08/21 1648 12/09/21 0558 12/10/21  0059  NA 125*  --  126* 121*  K 4.1  --  3.8 3.2*  CL 93*  --  92* 90*  CO2 23  --  25 21*  GLUCOSE 102*  --  109* 111*  BUN 13  --  10 10  CREATININE 0.70  --  0.86 0.87  CALCIUM 8.9  --  8.7* 8.2*  MG  --  1.9  --   --   PROT 6.8  --   --   --   ALBUMIN 4.0  --   --   --   AST 18  --   --   --   ALT 15  --   --   --   ALKPHOS 81  --   --   --   BILITOT 0.5  --   --   --   GFRNONAA >60  --  >60 >60  ANIONGAP 9  --  9 10    Lipids No results for input(s): CHOL, TRIG, HDL, LABVLDL, LDLCALC, CHOLHDL in the last 168 hours.  Hematology Recent Labs  Lab 12/08/21 1158 12/09/21 0558 12/10/21 0059  WBC 8.7 10.5 10.4  RBC 4.41 4.45 4.16  HGB 11.9* 12.3 11.4*  HCT 36.5 36.7 34.2*  MCV 82.8 82.5 82.2  MCH 27.0 27.6 27.4  MCHC 32.6 33.5 33.3  RDW 13.7 13.6 13.5  PLT 323 322 318   Thyroid  Recent Labs  Lab 12/08/21 1648 12/09/21 0558  TSH 7.190*  --    FREET4  --  1.27*    BNP Recent Labs  Lab 12/08/21 1158  BNP 453.8*    DDimer  Recent Labs  Lab 12/08/21 1158  DDIMER 0.80*     Radiology    DG Chest Port 1 View  Result Date: 12/08/2021 CLINICAL DATA:  Shortness of breath EXAM: PORTABLE CHEST 1 VIEW COMPARISON:  None. FINDINGS: Bilateral reticulonodular interstitial thickening. Left lower lobe retrocardiac airspace disease. Small left pleural effusion. No pneumothorax. Mild cardiomegaly. No acute osseous abnormality. Multiple left posterior rib fractures. IMPRESSION: 1. Bilateral interstitial thickening which may reflect interstitial edema versus pneumonia including atypical viral etiologies. 2. Left lower lobe retrocardiac airspace disease which may reflect atelectasis versus pneumonia. Electronically Signed   By: Kathreen Devoid M.D.   On: 12/08/2021 11:40   ECHOCARDIOGRAM COMPLETE  Result Date: 12/09/2021    ECHOCARDIOGRAM REPORT   Patient Name:   NANAKO STOPHER [facility] Date of Exam: 12/09/2021 Medical Rec #:  [id number]         Height:       66.5 in Accession #:    [id number]        Weight:       207.9 lb Date of Birth:  [birth year]        BSA:          2.044 m Patient Age:    88 years          BP:           139/93 mmHg Patient Gender: F                 HR:           93 bpm. Exam Location:  Inpatient Procedure: 2D Echo, Cardiac Doppler and Color Doppler Indications:    Atrial Fibrillation I48.91  History:        Patient has prior history of Echocardiogram examinations, most  recent 03/04/2016. Arrythmias:RBBB, Signs/Symptoms:Dyspnea; Risk                 Factors:Hypertension.  Sonographer:    Bernadene Person RDCS Referring Phys: [id number] RONDELL A SMITH IMPRESSIONS  1. Left ventricular ejection fraction, by estimation, is 45%. The left ventricle has mildly decreased function. The left ventricle demonstrates global hypokinesis. Left ventricular diastolic parameters are indeterminate.  2. Right ventricular systolic function is mildly  reduced. The right ventricular size is normal. There is mildly elevated pulmonary artery systolic pressure. The estimated right ventricular systolic pressure is 21.1 mmHg.  3. Left atrial size was mildly dilated.  4. The mitral valve is abnormal. Moderate mitral valve regurgitation. No evidence of mitral stenosis.  5. The aortic valve is tricuspid. There is moderate calcification of the aortic valve. Aortic valve regurgitation is mild. Aortic valve sclerosis/calcification is present, without any evidence of aortic stenosis.  6. Aortic dilatation noted. There is mild dilatation of the ascending aorta, measuring 40 mm.  7. The inferior vena cava is dilated in size with <50% respiratory variability, suggesting right atrial pressure of 15 mmHg.  8. There was a left pleural effusion.  9. The patient was in atrial fibrillation. FINDINGS  Left Ventricle: Left ventricular ejection fraction, by estimation, is 45%. The left ventricle has mildly decreased function. The left ventricle demonstrates global hypokinesis. The left ventricular internal cavity size was normal in size. There is no left ventricular hypertrophy. Left ventricular diastolic parameters are indeterminate. Right Ventricle: The right ventricular size is normal. No increase in right ventricular wall thickness. Right ventricular systolic function is mildly reduced. There is mildly elevated pulmonary artery systolic pressure. The tricuspid regurgitant velocity  is 2.32 m/s, and with an assumed right atrial pressure of 15 mmHg, the estimated right ventricular systolic pressure is 94.1 mmHg. Left Atrium: Left atrial size was mildly dilated. Right Atrium: Right atrial size was normal in size. Pericardium: There was a left pleural effusion. There is no evidence of pericardial effusion. Mitral Valve: The mitral valve is abnormal. There is mild calcification of the mitral valve leaflet(s). Moderate mitral valve regurgitation. No evidence of mitral valve stenosis.  Tricuspid Valve: The tricuspid valve is normal in structure. Tricuspid valve regurgitation is mild. Aortic Valve: The aortic valve is tricuspid. There is moderate calcification of the aortic valve. Aortic valve regurgitation is mild. Aortic regurgitation PHT measures 523 msec. Aortic valve sclerosis/calcification is present, without any evidence of aortic stenosis. Pulmonic Valve: The pulmonic valve was normal in structure. Pulmonic valve regurgitation is not visualized. Aorta: The aortic root is normal in size and structure and aortic dilatation noted. There is mild dilatation of the ascending aorta, measuring 40 mm. Venous: The inferior vena cava is dilated in size with less than 50% respiratory variability, suggesting right atrial pressure of 15 mmHg. IAS/Shunts: No atrial level shunt detected by color flow Doppler.  LEFT VENTRICLE PLAX 2D LVIDd:         4.80 cm LVIDs:         3.60 cm LV PW:         1.10 cm LV IVS:        1.00 cm LVOT diam:     2.20 cm LV SV:         48 LV SV Index:   23 LVOT Area:     3.80 cm  LV Volumes (MOD) LV vol d, MOD A2C: 98.7 ml LV vol d, MOD A4C: 78.6 ml LV vol s, MOD A2C: 53.7 ml  LV vol s, MOD A4C: 47.2 ml LV SV MOD A2C:     45.0 ml LV SV MOD A4C:     78.6 ml LV SV MOD BP:      39.7 ml RIGHT VENTRICLE TAPSE (M-mode): 1.7 cm LEFT ATRIUM             Index        RIGHT ATRIUM           Index LA diam:        3.90 cm 1.91 cm/m   RA Area:     16.30 cm LA Vol (A2C):   65.6 ml 32.09 ml/m  RA Volume:   37.70 ml  18.44 ml/m LA Vol (A4C):   63.4 ml 31.01 ml/m LA Biplane Vol: 67.5 ml 33.02 ml/m  AORTIC VALVE LVOT Vmax:         81.73 cm/s LVOT Vmean:        53.333 cm/s LVOT VTI:          0.126 m AI PHT:            523 msec AR Vena Contracta: 0.30 cm  AORTA Ao Root diam: 3.50 cm Ao Asc diam:  4.00 cm MR Peak grad:    93.3 mmHg    TRICUSPID VALVE MR Mean grad:    57.0 mmHg    TR Peak grad:   21.5 mmHg MR Vmax:         483.00 cm/s  TR Vmax:        232.00 cm/s MR Vmean:        354.0 cm/s MR  PISA:         1.01 cm     SHUNTS MR PISA Eff ROA: 8 mm        Systemic VTI:  0.13 m MR PISA Radius:  0.40 cm      Systemic Diam: 2.20 cm Dalton McleanMD Electronically signed by Franki Monte Signature Date/Time: 12/09/2021/7:57:33 PM    Final     Cardiac Studies   Echo 03/04/16 - Left ventricle: The cavity size was normal. Wall thickness was    normal. Systolic function was normal. The estimated ejection    fraction was in the range of 50% to 55%.  - Aortic valve: Calcified non coronary cusp. There was trivial    regurgitation.  - Mitral valve: There was mild regurgitation.  - Left atrium: The atrium was mildly dilated.  - Atrial septum: No defect or patent foramen ovale was identified.   Patient Profile     88 y.o. female with hypertension, hyperlipidemia, and newly diagnosed atrial fibrillation.  Assessment & Plan    # Paroxysmal atrial fibrillation: Newly noted this admission.  She reports 3 weeks of exertional dyspnea and edema.  I suspect this is the timeline of her new onset atrial fibrillation.  Rates are better controlled since increasing metoprolol.  However she remains tachycardic.  Continue metoprolol and  consolidate to succinate prior to discharge.  It is possible metoprolol is contributing to her wheeze.  Avoid diltiazem given low EF.  Consider switching to carvedilol once she is in sinus rhythm.  # Acute systolic diastolic heart failure: BNP elevated on admission and she is volume overloaded.  Echo revealed LVEF 45%.  I suspect this is all in the setting of new onset atrial fibrillation.  She has not had any ischemic symptoms.  Given lack of her ischemic symptoms and her age, we will plan to pursue TEE/DCCV as above.  Repeat  echo in 3 months.  At that point if her LVEF remains reduced would consider an ischemic evaluation.  She was started on metoprolol tartrate for rate control.  Will need to consolidate to succinate prior to discharge.  We will add additional guideline  directed medical therapy as blood pressure allows.  Renal function is stable with diuresis but her hyponatremia seems to be worsening.  We will continue Lasix and continue to monitor for now.  #Hyponatremia: Hyponatremia is worsening with diuresis.  This is odd, given that her fluid status seems to be improving.  Her fluoxetine could also contribute.  If she remains hyponatremic once more euvolemic, will need to consider changing her fluoxetine.  No malignancy is noted on chest x-ray.  Possible this could be coming from a viral pneumonia.  She is much more wheezy on exam than I would expect just for heart failure, especially if she does not otherwise seem to be very volume overloaded anymore.  She was negative for COVID and flu on admission.       For questions or updates, please contact [place] Please consult [email or web address] for contact info under        Signed, [name], MD  12/10/2021, 9:58 AM

## 2021-12-10 NOTE — Progress Notes (Signed)
Not able to tolerate potassium iv  complaining of pain on iv site. MD aware and gave verbal order to d/c iv potassium. ?

## 2021-12-10 NOTE — Progress Notes (Signed)
ANTICOAGULATION CONSULT NOTE  ? ?Pharmacy Consult for heparin ?Indication: atrial fibrillation ? ?No Known Allergies ? ?Patient Measurements: ?Height: 5' 6.5" (168.9 cm) ?Weight: 94 kg (207 lb 3.7 oz) ?IBW/kg (Calculated) : 60.45 ?Heparin Dosing Weight: 81kg ? ?Vital Signs: ?Temp: 98 ?F (36.7 ?C) (03/07 3532) ?Temp Source: Oral (03/07 0317) ?BP: 137/84 (03/07 0317) ?Pulse Rate: 106 (03/07 0732) ? ?Labs: ?Recent Labs  ?  12/08/21 ?1158 12/08/21 ?2153 12/09/21 ?9924 12/10/21 ?0059  ?HGB 11.9*  --  12.3 11.4*  ?HCT 36.5  --  36.7 34.2*  ?PLT 323  --  322 318  ?HEPARINUNFRC  --  0.38 0.35 0.35  ?CREATININE 0.70  --  0.86 0.87  ?TROPONINIHS 6  --   --   --   ? ? ?Estimated Creatinine Clearance: 52.1 mL/min (by C-G formula based on SCr of 0.87 mg/dL). ? ? ?Medical History: ?Past Medical History:  ?Diagnosis Date  ? Dyspnea   ? diastolic dysfunction by echo 2012  ? GERD (gastroesophageal reflux disease)   ? Hypertension   ? Mild mitral and aortic regurgitation   ? Osteoarthritis   ? Right bundle branch block 2014  ? Thyroid disease   ? ? ?Assessment: ?50 YOF presenting with SOB, in Afib with RVR. No anticoagulation prior to admission. Pharmacy consulted for heparin.   ? ?Heparin level 0.35 is therapeutic (at low end of goal) and unchanged on 1150 units/hr. Planning TEE and DCCV soon and eventual transition to [place].  ? ?Goal of Therapy:  ?Heparin level 0.3-0.7 units/ml ?Monitor platelets by anticoagulation protocol: Yes ?  ?Plan:  ?Increase heparin slightly to 1250 units/hr to prevent drop to subtherapeutic level ?Monitor daily heparin level, CBC ?Monitor for signs/symptoms of bleeding  ?F/u transition to [place]  ? ? ?Benetta Spar, PharmD, BCPS, BCCP ?Clinical Pharmacist ? ?Please check AMION for all [place] phone numbers ?After 10:00 PM, call [place] [phone] ? ?

## 2021-12-10 NOTE — Progress Notes (Signed)
PROGRESS NOTE    [name]  TAV:[id number] DOB: 20-Feb-1931 DOA: 12/08/2021 PCP: Jani Gravel, MD  Narrative 89/F with history of hypertension dyslipidemia, mild AR, MR, hypothyroidism and GERD presented to the ED 3/5 with acute onset dyspnea.  Patient reported progressive shortness of breath and palpitations X 1 week -In the ED she was noted to be in A-fib HR- 120s, and CHF.  Chest x-ray noted bilateral interstitial thickening concerning for edema versus pneumonia in the left lower lobe -Started on diuretics, cardiology following, TEE/cardioversion planned  Subjective: -Remains short of breath, somewhat uncomfortable  Assessment and Plan:  Acute CHF (congestive heart failure) (HCC) Acute systolic CHF -Remains fluid overloaded, echo with EF of 45% with moderate MR -Continue IV Lasix today she is 2.6 L negative -Monitor I's/O, daily weights -Add Xopenex nebs  Atrial fibrillation with RVR ([place]) - New onset A-fib with heart rates into the 110s.  -CHA2DS2-VASc>3, continue heparin gtt. -Cardiology consulting, plan for TEE/cardioversion on Wednesday -Continue p.o. metoprolol, dose increased  SIRS (systemic inflammatory response syndrome) (HCC) - Clinically do not suspect pneumonia, chest x-ray findings are suggestive of CHF and atelectasis -Afebrile, no leukocytosis, lactate and procalcitonin are normal -discontinued antibiotics yesterday  Hypothyroidism Home medication regimen includes levothyroxine 150 to 225 mcg on alternating days, TSH elevated, will increase Synthroid dose to 200 mcg daily   Hyponatremia - Likely multifactorial, sodium is worse today -Clinically volume overloaded, continue diuretics, will discontinue fluoxetine -TSH was elevated as well, will increase Synthroid dose  Normocytic anemia - Hemoglobin normal on recheck   DVT prophylaxis: IV heparin Code Status: Full code Family Communication: Discussed patient in detail, no family at  bedside Disposition Plan: Home likely 48 hours   Consultants:  Cardiology  Procedures:   Antimicrobials:    Objective: Vitals:   12/10/21 0317 12/10/21 0732 12/10/21 0809 12/10/21 0858  BP: 137/84   126/78  Pulse: 97 (!) 106 89 (!) 114  Resp: (!) 21 (!) 24 16   Temp: 98 F (36.7 C)     TempSrc: Oral     SpO2: 95% (!) 88% 96%   Weight: 94 kg     Height:        Intake/Output Summary (Last 24 hours) at 12/10/2021 1004 Last data filed at 12/10/2021 1000 Gross per 24 hour  Intake 460.4 ml  Output 1100 ml  Net -639.6 ml   Filed Weights   12/08/21 1114 12/09/21 0416 12/10/21 0317  Weight: 94.3 kg 94.3 kg 94 kg    Examination:  General exam: Obese pleasant female sitting up in bed, AAOx3 HEENT: Positive JVD CVS: S1-S2, irregularly irregular rhythm Lungs: Fine basilar Rales, few wheezes noted Abdomen: Soft, nontender, bowel sounds present Extremities: 1+ edema Skin: No rashes Psychiatry: Judgement and insight appear normal. Mood & affect appropriate.     Data Reviewed:   CBC: Recent Labs  Lab 12/08/21 1158 12/09/21 0558 12/10/21 0059  WBC 8.7 10.5 10.4  NEUTROABS 6.6  --   --   HGB 11.9* 12.3 11.4*  HCT 36.5 36.7 34.2*  MCV 82.8 82.5 82.2  PLT 323 322 553   Basic Metabolic Panel: Recent Labs  Lab 12/08/21 1158 12/08/21 1648 12/09/21 0558 12/10/21 0059  NA 125*  --  126* 121*  K 4.1  --  3.8 3.2*  CL 93*  --  92* 90*  CO2 23  --  25 21*  GLUCOSE 102*  --  109* 111*  BUN 13  --  10 10  CREATININE 0.70  --  0.86 0.87  CALCIUM 8.9  --  8.7* 8.2*  MG  --  1.9  --   --    GFR: Estimated Creatinine Clearance: 52.1 mL/min (by C-G formula based on SCr of 0.87 mg/dL). Liver Function Tests: Recent Labs  Lab 12/08/21 1158  AST 18  ALT 15  ALKPHOS 81  BILITOT 0.5  PROT 6.8  ALBUMIN 4.0   No results for input(s): LIPASE, AMYLASE in the last 168 hours. No results for input(s): AMMONIA in the last 168 hours. Coagulation Profile: No results for  input(s): INR, PROTIME in the last 168 hours. Cardiac Enzymes: No results for input(s): CKTOTAL, CKMB, CKMBINDEX, TROPONINI in the last 168 hours. BNP (last 3 results) No results for input(s): PROBNP in the last 8760 hours. HbA1C: Recent Labs    12/08/21 1836  HGBA1C 4.9   CBG: Recent Labs  Lab 12/08/21 2119 12/09/21 0614  GLUCAP 119* 110*   Lipid Profile: No results for input(s): CHOL, HDL, LDLCALC, TRIG, CHOLHDL, LDLDIRECT in the last 72 hours. Thyroid Function Tests: Recent Labs    12/08/21 1648 12/09/21 0558  TSH 7.190*  --   FREET4  --  1.27*   Anemia Panel: No results for input(s): VITAMINB12, FOLATE, FERRITIN, TIBC, IRON, RETICCTPCT in the last 72 hours. Urine analysis: No results found for: COLORURINE, APPEARANCEUR, LABSPEC, PHURINE, GLUCOSEU, HGBUR, BILIRUBINUR, KETONESUR, PROTEINUR, UROBILINOGEN, NITRITE, LEUKOCYTESUR Sepsis Labs: '@LABRCNTIP'$ (procalcitonin:4,lacticidven:4)  ) Recent Results (from the past 240 hour(s))  Blood culture (routine x 2)     Status: None (Preliminary result)   Collection Time: 12/08/21 11:07 AM   Specimen: BLOOD  Result Value Ref Range Status   Specimen Description   Final    BLOOD BLOOD RIGHT FOREARM Performed at [facility], [address], [place], [place] [address]    Special Requests   Final    BOTTLES DRAWN AEROBIC AND ANAEROBIC Blood Culture adequate volume Performed at [facility], [address], [place], [place] [address]    Culture   Final    NO GROWTH < 24 HOURS Performed at [facility] [address] [place], [place] [address]    Report Status PENDING  Incomplete  Resp Panel by RT-PCR (Flu A&B, Covid) Nasopharyngeal Swab     Status: None   Collection Time: 12/08/21 12:05 PM   Specimen: Nasopharyngeal Swab; Nasopharyngeal(NP) swabs in vial transport medium  Result Value Ref Range Status   SARS Coronavirus 2 by RT PCR NEGATIVE NEGATIVE Final    Comment:  (NOTE) SARS-CoV-2 target nucleic acids are NOT DETECTED.  The SARS-CoV-2 RNA is generally detectable in upper respiratory specimens during the acute phase of infection. The lowest concentration of SARS-CoV-2 viral copies this assay can detect is 138 copies/mL. A negative result does not preclude SARS-Cov-2 infection and should not be used as the sole basis for treatment or other patient management decisions. A negative result may occur with  improper specimen collection/handling, submission of specimen other than nasopharyngeal swab, presence of viral mutation(s) within the areas targeted by this assay, and inadequate number of viral copies(<138 copies/mL). A negative result must be combined with clinical observations, patient history, and epidemiological information. The expected result is Negative.  Fact Sheet for Patients:  EntrepreneurPulse.com.au  Fact Sheet for Healthcare Providers:  IncredibleEmployment.be  This test is no t yet approved or cleared by the [place] FDA and  has been authorized for detection and/or diagnosis of SARS-CoV-2 by FDA under an Emergency Use Authorization (EUA). This EUA will  remain  in effect (meaning this test can be used) for the duration of the COVID-19 declaration under Section 564(b)(1) of the Act, 21 U.S.C.section 360bbb-3(b)(1), unless the authorization is terminated  or revoked sooner.       Influenza A by PCR NEGATIVE NEGATIVE Final   Influenza B by PCR NEGATIVE NEGATIVE Final    Comment: (NOTE) The Xpert Xpress SARS-CoV-2/FLU/RSV plus assay is intended as an aid in the diagnosis of influenza from Nasopharyngeal swab specimens and should not be used as a sole basis for treatment. Nasal washings and aspirates are unacceptable for Xpert Xpress SARS-CoV-2/FLU/RSV testing.  Fact Sheet for Patients: EntrepreneurPulse.com.au  Fact Sheet for Healthcare  Providers: IncredibleEmployment.be  This test is not yet approved or cleared by the [place] FDA and has been authorized for detection and/or diagnosis of SARS-CoV-2 by FDA under an Emergency Use Authorization (EUA). This EUA will remain in effect (meaning this test can be used) for the duration of the COVID-19 declaration under Section 564(b)(1) of the Act, 21 U.S.C. section 360bbb-3(b)(1), unless the authorization is terminated or revoked.  Performed at [facility], [address] [place], [place] [address]   Blood culture (routine x 2)     Status: None (Preliminary result)   Collection Time: 12/08/21 12:05 PM   Specimen: BLOOD  Result Value Ref Range Status   Specimen Description   Final    BLOOD LEFT ANTECUBITAL Performed at [facility], [address] [place], [place] [address]    Special Requests   Final    BOTTLES DRAWN AEROBIC AND ANAEROBIC Blood Culture adequate volume Performed at [facility], [address], [place], [place] [address]    Culture   Final    NO GROWTH < 24 HOURS Performed at [facility], [place] [address] [place], [place] [address]    Report Status PENDING  Incomplete  MRSA Next Gen by PCR, Nasal     Status: None   Collection Time: 12/08/21  4:24 PM   Specimen: Nasal Mucosa; Nasal Swab  Result Value Ref Range Status   MRSA by PCR Next Gen NOT DETECTED NOT DETECTED Final    Comment: (NOTE) The GeneXpert MRSA Assay (FDA approved for NASAL specimens only), is one component of a comprehensive MRSA colonization surveillance program. It is not intended to diagnose MRSA infection nor to guide or monitor treatment for MRSA infections. Test performance is not FDA approved in patients less than 71 years old. Performed at [facility], [place] [address], [place], [place] [address]      Radiology Studies: DG Chest Port 1 View  Result Date: 12/08/2021 CLINICAL  DATA:  Shortness of breath EXAM: PORTABLE CHEST 1 VIEW COMPARISON:  None. FINDINGS: Bilateral reticulonodular interstitial thickening. Left lower lobe retrocardiac airspace disease. Small left pleural effusion. No pneumothorax. Mild cardiomegaly. No acute osseous abnormality. Multiple left posterior rib fractures. IMPRESSION: 1. Bilateral interstitial thickening which may reflect interstitial edema versus pneumonia including atypical viral etiologies. 2. Left lower lobe retrocardiac airspace disease which may reflect atelectasis versus pneumonia. Electronically Signed   By: Kathreen Devoid M.D.   On: 12/08/2021 11:40   ECHOCARDIOGRAM COMPLETE  Result Date: 12/09/2021    ECHOCARDIOGRAM REPORT   Patient Name:   AUBREY BLACKARD [facility] Date of Exam: 12/09/2021 Medical Rec #:  [id number]         Height:       66.5 in Accession #:    [id number]  Weight:       207.9 lb Date of Birth:  [birth year]        BSA:          2.044 m Patient Age:    42 years          BP:           139/93 mmHg Patient Gender: F                 HR:           93 bpm. Exam Location:  Inpatient Procedure: 2D Echo, Cardiac Doppler and Color Doppler Indications:    Atrial Fibrillation I48.91  History:        Patient has prior history of Echocardiogram examinations, most                 recent 03/04/2016. Arrythmias:RBBB, Signs/Symptoms:Dyspnea; Risk                 Factors:Hypertension.  Sonographer:    Bernadene Person RDCS Referring Phys: [id number] RONDELL A SMITH IMPRESSIONS  1. Left ventricular ejection fraction, by estimation, is 45%. The left ventricle has mildly decreased function. The left ventricle demonstrates global hypokinesis. Left ventricular diastolic parameters are indeterminate.  2. Right ventricular systolic function is mildly reduced. The right ventricular size is normal. There is mildly elevated pulmonary artery systolic pressure. The estimated right ventricular systolic pressure is 76.7 mmHg.  3. Left atrial size was mildly dilated.  4. The  mitral valve is abnormal. Moderate mitral valve regurgitation. No evidence of mitral stenosis.  5. The aortic valve is tricuspid. There is moderate calcification of the aortic valve. Aortic valve regurgitation is mild. Aortic valve sclerosis/calcification is present, without any evidence of aortic stenosis.  6. Aortic dilatation noted. There is mild dilatation of the ascending aorta, measuring 40 mm.  7. The inferior vena cava is dilated in size with <50% respiratory variability, suggesting right atrial pressure of 15 mmHg.  8. There was a left pleural effusion.  9. The patient was in atrial fibrillation. FINDINGS  Left Ventricle: Left ventricular ejection fraction, by estimation, is 45%. The left ventricle has mildly decreased function. The left ventricle demonstrates global hypokinesis. The left ventricular internal cavity size was normal in size. There is no left ventricular hypertrophy. Left ventricular diastolic parameters are indeterminate. Right Ventricle: The right ventricular size is normal. No increase in right ventricular wall thickness. Right ventricular systolic function is mildly reduced. There is mildly elevated pulmonary artery systolic pressure. The tricuspid regurgitant velocity  is 2.32 m/s, and with an assumed right atrial pressure of 15 mmHg, the estimated right ventricular systolic pressure is 20.9 mmHg. Left Atrium: Left atrial size was mildly dilated. Right Atrium: Right atrial size was normal in size. Pericardium: There was a left pleural effusion. There is no evidence of pericardial effusion. Mitral Valve: The mitral valve is abnormal. There is mild calcification of the mitral valve leaflet(s). Moderate mitral valve regurgitation. No evidence of mitral valve stenosis. Tricuspid Valve: The tricuspid valve is normal in structure. Tricuspid valve regurgitation is mild. Aortic Valve: The aortic valve is tricuspid. There is moderate calcification of the aortic valve. Aortic valve regurgitation  is mild. Aortic regurgitation PHT measures 523 msec. Aortic valve sclerosis/calcification is present, without any evidence of aortic stenosis. Pulmonic Valve: The pulmonic valve was normal in structure. Pulmonic valve regurgitation is not visualized. Aorta: The aortic root is normal in size and structure and aortic dilatation noted. There is mild dilatation of  the ascending aorta, measuring 40 mm. Venous: The inferior vena cava is dilated in size with less than 50% respiratory variability, suggesting right atrial pressure of 15 mmHg. IAS/Shunts: No atrial level shunt detected by color flow Doppler.  LEFT VENTRICLE PLAX 2D LVIDd:         4.80 cm LVIDs:         3.60 cm LV PW:         1.10 cm LV IVS:        1.00 cm LVOT diam:     2.20 cm LV SV:         48 LV SV Index:   23 LVOT Area:     3.80 cm  LV Volumes (MOD) LV vol d, MOD A2C: 98.7 ml LV vol d, MOD A4C: 78.6 ml LV vol s, MOD A2C: 53.7 ml LV vol s, MOD A4C: 47.2 ml LV SV MOD A2C:     45.0 ml LV SV MOD A4C:     78.6 ml LV SV MOD BP:      39.7 ml RIGHT VENTRICLE TAPSE (M-mode): 1.7 cm LEFT ATRIUM             Index        RIGHT ATRIUM           Index LA diam:        3.90 cm 1.91 cm/m   RA Area:     16.30 cm LA Vol (A2C):   65.6 ml 32.09 ml/m  RA Volume:   37.70 ml  18.44 ml/m LA Vol (A4C):   63.4 ml 31.01 ml/m LA Biplane Vol: 67.5 ml 33.02 ml/m  AORTIC VALVE LVOT Vmax:         81.73 cm/s LVOT Vmean:        53.333 cm/s LVOT VTI:          0.126 m AI PHT:            523 msec AR Vena Contracta: 0.30 cm  AORTA Ao Root diam: 3.50 cm Ao Asc diam:  4.00 cm MR Peak grad:    93.3 mmHg    TRICUSPID VALVE MR Mean grad:    57.0 mmHg    TR Peak grad:   21.5 mmHg MR Vmax:         483.00 cm/s  TR Vmax:        232.00 cm/s MR Vmean:        354.0 cm/s MR PISA:         1.01 cm     SHUNTS MR PISA Eff ROA: 8 mm        Systemic VTI:  0.13 m MR PISA Radius:  0.40 cm      Systemic Diam: 2.20 cm Dalton McleanMD Electronically signed by Franki Monte Signature Date/Time:  12/09/2021/7:57:33 PM    Final      Scheduled Meds:  aspirin EC  81 mg Oral Daily   furosemide  40 mg Intravenous BID   levalbuterol  0.63 mg Nebulization TID   levothyroxine  200 mcg Oral Q0600   mouth rinse  15 mL Mouth Rinse BID   metoprolol tartrate  50 mg Oral BID   potassium chloride  40 mEq Oral BID   pravastatin  40 mg Oral q1800   sodium chloride flush  3 mL Intravenous Q12H   vitamin B-1  125 mg Oral QPM   Continuous Infusions:  sodium chloride     heparin 1,250 Units/hr (12/10/21 1000)  LOS: 2 days    Time spent: 84mn    PDomenic Polite MD [facility]   12/10/2021, 10:04 AM

## 2021-12-11 ENCOUNTER — Encounter (HOSPITAL_COMMUNITY): Payer: Self-pay | Admitting: Internal Medicine

## 2021-12-11 ENCOUNTER — Inpatient Hospital Stay (HOSPITAL_COMMUNITY): Payer: Medicare HMO | Admitting: Anesthesiology

## 2021-12-11 ENCOUNTER — Inpatient Hospital Stay (HOSPITAL_COMMUNITY): Payer: Medicare HMO

## 2021-12-11 ENCOUNTER — Encounter (HOSPITAL_COMMUNITY)
Admission: EM | Disposition: A | Discharge status: Home Health | Payer: Self-pay | Source: Home / Self Care | Attending: Internal Medicine

## 2021-12-11 DIAGNOSIS — I10 Essential (primary) hypertension: Secondary | ICD-10-CM | POA: Diagnosis not present

## 2021-12-11 DIAGNOSIS — E039 Hypothyroidism, unspecified: Secondary | ICD-10-CM | POA: Diagnosis not present

## 2021-12-11 DIAGNOSIS — I110 Hypertensive heart disease with heart failure: Secondary | ICD-10-CM | POA: Diagnosis not present

## 2021-12-11 DIAGNOSIS — I4891 Unspecified atrial fibrillation: Secondary | ICD-10-CM | POA: Diagnosis not present

## 2021-12-11 DIAGNOSIS — I509 Heart failure, unspecified: Secondary | ICD-10-CM | POA: Diagnosis not present

## 2021-12-11 DIAGNOSIS — I5041 Acute combined systolic (congestive) and diastolic (congestive) heart failure: Secondary | ICD-10-CM | POA: Diagnosis not present

## 2021-12-11 DIAGNOSIS — J189 Pneumonia, unspecified organism: Secondary | ICD-10-CM | POA: Diagnosis not present

## 2021-12-11 DIAGNOSIS — I5031 Acute diastolic (congestive) heart failure: Secondary | ICD-10-CM | POA: Diagnosis not present

## 2021-12-11 DIAGNOSIS — I340 Nonrheumatic mitral (valve) insufficiency: Secondary | ICD-10-CM | POA: Diagnosis not present

## 2021-12-11 DIAGNOSIS — I351 Nonrheumatic aortic (valve) insufficiency: Secondary | ICD-10-CM | POA: Diagnosis not present

## 2021-12-11 DIAGNOSIS — E871 Hypo-osmolality and hyponatremia: Secondary | ICD-10-CM | POA: Diagnosis not present

## 2021-12-11 DIAGNOSIS — E669 Obesity, unspecified: Secondary | ICD-10-CM

## 2021-12-11 HISTORY — PX: TEE WITHOUT CARDIOVERSION: SHX5443

## 2021-12-11 HISTORY — PX: CARDIOVERSION: SHX1299

## 2021-12-11 LAB — ECHO TEE
AR max vel: 3.2 cm2
AV Area VTI: 2.53 cm2
AV Area mean vel: 3.35 cm2
AV Mean grad: 3 mmHg
AV Peak grad: 7 mmHg
Ao pk vel: 1.32 m/s
MV M vel: 4.26 m/s
MV Peak grad: 72.6 mmHg
P 1/2 time: 723 msec
Radius: 0.5 cm

## 2021-12-11 LAB — CBC
HCT: 35.5 % — ABNORMAL LOW (ref 36.0–46.0)
Hemoglobin: 12.2 g/dL (ref 12.0–15.0)
MCH: 27.6 pg (ref 26.0–34.0)
MCHC: 34.4 g/dL (ref 30.0–36.0)
MCV: 80.3 fL (ref 80.0–100.0)
Platelets: 362 10*3/uL (ref 150–400)
RBC: 4.42 MIL/uL (ref 3.87–5.11)
RDW: 13.2 % (ref 11.5–15.5)
WBC: 13.3 10*3/uL — ABNORMAL HIGH (ref 4.0–10.5)
nRBC: 0 % (ref 0.0–0.2)

## 2021-12-11 LAB — HEPARIN LEVEL (UNFRACTIONATED): Heparin Unfractionated: 0.54 IU/mL (ref 0.30–0.70)

## 2021-12-11 LAB — BASIC METABOLIC PANEL
Anion gap: 8 (ref 5–15)
BUN: 13 mg/dL (ref 8–23)
CO2: 23 mmol/L (ref 22–32)
Calcium: 8.5 mg/dL — ABNORMAL LOW (ref 8.9–10.3)
Chloride: 86 mmol/L — ABNORMAL LOW (ref 98–111)
Creatinine, Ser: 1.05 mg/dL — ABNORMAL HIGH (ref 0.44–1.00)
GFR, Estimated: 51 mL/min — ABNORMAL LOW (ref >60)
Glucose, Bld: 125 mg/dL — ABNORMAL HIGH (ref 70–99)
Potassium: 4.4 mmol/L (ref 3.5–5.1)
Sodium: 121 mmol/L — ABNORMAL LOW (ref 135–145)

## 2021-12-11 LAB — BRAIN NATRIURETIC PEPTIDE: B Natriuretic Peptide: 733.6 pg/mL — ABNORMAL HIGH (ref 0.0–100.0)

## 2021-12-11 SURGERY — ECHOCARDIOGRAM, TRANSESOPHAGEAL
Anesthesia: General

## 2021-12-11 MED ORDER — PROPOFOL 500 MG/50ML IV EMUL
INTRAVENOUS | Status: DC | PRN
  Administered 2021-12-11: 100 ug/kg/min via INTRAVENOUS

## 2021-12-11 MED ORDER — LIDOCAINE 2% (20 MG/ML) 5 ML SYRINGE
INTRAMUSCULAR | Status: DC | PRN
  Administered 2021-12-11: 30 mg via INTRAVENOUS

## 2021-12-11 MED ORDER — APIXABAN 5 MG PO TABS
5.0000 mg | ORAL_TABLET | Freq: Two times a day (BID) | ORAL | Status: DC
  Administered 2021-12-11 – 2021-12-22 (×23): 5 mg via ORAL
  Filled 2021-12-11 (×23): qty 1

## 2021-12-11 MED ORDER — PROPOFOL 10 MG/ML IV BOLUS
INTRAVENOUS | Status: DC | PRN
Start: 2021-12-11 — End: 2021-12-11
  Administered 2021-12-11: 30 mg via INTRAVENOUS

## 2021-12-11 MED ORDER — PHENYLEPHRINE HCL (PRESSORS) 10 MG/ML IV SOLN
INTRAVENOUS | Status: DC | PRN
  Administered 2021-12-11 (×2): 100 ug via INTRAVENOUS

## 2021-12-11 MED ORDER — LORAZEPAM 0.5 MG PO TABS
0.5000 mg | ORAL_TABLET | Freq: Once | ORAL | Status: AC
  Administered 2021-12-11: 0.5 mg via ORAL

## 2021-12-11 MED ORDER — METOPROLOL SUCCINATE ER 100 MG PO TB24
100.0000 mg | ORAL_TABLET | Freq: Every day | ORAL | Status: DC
  Administered 2021-12-12 – 2021-12-13 (×2): 100 mg via ORAL
  Filled 2021-12-11 (×3): qty 1

## 2021-12-11 MED ORDER — HEPARIN (PORCINE) 25000 UT/250ML-% IV SOLN
INTRAVENOUS | Status: DC | PRN
  Administered 2021-12-11: 1250 [IU]/h via INTRAVENOUS

## 2021-12-11 MED ORDER — FUROSEMIDE 10 MG/ML IJ SOLN
80.0000 mg | Freq: Two times a day (BID) | INTRAMUSCULAR | Status: DC
  Administered 2021-12-11 – 2021-12-12 (×2): 80 mg via INTRAVENOUS
  Filled 2021-12-11 (×2): qty 8

## 2021-12-11 NOTE — Anesthesia Postprocedure Evaluation (Signed)
Anesthesia Post Note ? ?Patient: [name] ? ?Procedure(s) Performed: TRANSESOPHAGEAL ECHOCARDIOGRAM (TEE) ?CARDIOVERSION ? ?  ? ?Patient location during evaluation: Endoscopy ?Anesthesia Type: General ?Level of consciousness: awake and alert ?Pain management: pain level controlled ?Vital Signs Assessment: post-procedure vital signs reviewed and stable ?Respiratory status: spontaneous breathing, nonlabored ventilation and respiratory function stable ?Cardiovascular status: blood pressure returned to baseline and stable ?Postop Assessment: no apparent nausea or vomiting ?Anesthetic complications: no ? ? ?No notable events documented. ? ?Last Vitals:  ?Vitals:  ? 12/11/21 1029 12/11/21 1040  ?BP: (!) 96/47 113/67  ?Pulse: (!) 53 (!) 56  ?Resp: 13 15  ?Temp: (!) 36.2 ?C   ?SpO2: 100% 94%  ?  ?Last Pain:  ?Vitals:  ? 12/11/21 1040  ?TempSrc:   ?PainSc: 0-No pain  ? ? ?  ?  ?  ?  ?  ?  ? ?Leah Skora,W. EDMOND ? ? ? ? ?

## 2021-12-11 NOTE — Progress Notes (Signed)
Progress Note  Patient Name: [name] Date of Encounter: 12/11/2021  [facility] Cardiologist: None   Subjective   She is still short of breath and wheezing.  Happy to be back in sinus rhythm.  Inpatient Medications    Scheduled Meds:  aspirin EC  81 mg Oral Daily   furosemide  40 mg Intravenous BID   levalbuterol  0.63 mg Nebulization TID   levothyroxine  200 mcg Oral Q0600   mouth rinse  15 mL Mouth Rinse BID   metoprolol tartrate  50 mg Oral BID   pravastatin  40 mg Oral q1800   sodium chloride flush  3 mL Intravenous Q12H   vitamin B-1  125 mg Oral QPM   Continuous Infusions:  sodium chloride     heparin 1,250 Units/hr (12/11/21 0332)   PRN Meds: sodium chloride, acetaminophen, albuterol, fluticasone, LORazepam, ondansetron (ZOFRAN) IV, sodium chloride flush   Vital Signs    Vitals:   12/11/21 1029 12/11/21 1040 12/11/21 1050 12/11/21 1113  BP: (!) 96/47 113/67 125/60 116/78  Pulse: (!) 53 (!) 56 (!) 57   Resp: '13 15 16 16  '$ Temp: (!) 97.2 F (36.2 C)   (!) 97.5 F (36.4 C)  TempSrc: Temporal   Oral  SpO2: 100% 94% 95%   Weight:      Height:        Intake/Output Summary (Last 24 hours) at 12/11/2021 1346 Last data filed at 12/11/2021 0313 Gross per 24 hour  Intake 288.12 ml  Output 750 ml  Net -461.88 ml   Last 3 Weights 12/11/2021 12/10/2021 12/09/2021  Weight (lbs) 207 lb 10.8 oz 207 lb 3.7 oz 207 lb 14.3 oz  Weight (kg) 94.2 kg 94 kg 94.3 kg      Telemetry    Atrial fibrillation converted to sinus rhythm. - Personally Reviewed  ECG    Atrial fibrillation.  Rate 115.  Right bundle branch block.- Personally Reviewed  Physical Exam   VS:  BP 116/78 (BP Location: Right Arm)    Pulse (!) 57    Temp (!) 97.5 F (36.4 C) (Oral)    Resp 16    Ht 5' 6.5" (1.689 m)    Wt 94.2 kg    SpO2 95%    BMI 33.02 kg/m  , BMI Body mass index is 33.02 kg/m. GENERAL:  Well appearing HEENT: Pupils equal round and reactive, fundi not visualized, oral mucosa  unremarkable NECK:  No jugular venous distention, waveform within normal limits, carotid upstroke brisk and symmetric, no bruits, no thyromegaly LUNGS: Diffuse expiratory wheezing.  No rhonchi or crackles. HEART:  RRR.  PMI not displaced or sustained,S1 and S2 within normal limits, no S3, no S4, no clicks, no rubs, no murmurs ABD:  Flat, positive bowel sounds normal in frequency in pitch, no bruits, no rebound, no guarding, no midline pulsatile mass, no hepatomegaly, no splenomegaly EXT:  2 plus pulses throughout, trace lower extremity edema, no cyanosis no clubbing SKIN:  No rashes no nodules NEURO:  Cranial nerves II through XII grossly intact, motor grossly intact throughout PSYCH:  Cognitively intact, oriented to person place and time  Labs    High Sensitivity Troponin:   Recent Labs  Lab 12/08/21 1158  TROPONINIHS 6     Chemistry Recent Labs  Lab 12/08/21 1158 12/08/21 1648 12/09/21 0558 12/10/21 0059 12/11/21 0051  NA 125*  --  126* 121* 121*  K 4.1  --  3.8 3.2* 4.4  CL 93*  --  92* 90* 86*  CO2 23  --  25 21* 23  GLUCOSE 102*  --  109* 111* 125*  BUN 13  --  '10 10 13  '$ CREATININE 0.70  --  0.86 0.87 1.05*  CALCIUM 8.9  --  8.7* 8.2* 8.5*  MG  --  1.9  --   --   --   PROT 6.8  --   --   --   --   ALBUMIN 4.0  --   --   --   --   AST 18  --   --   --   --   ALT 15  --   --   --   --   ALKPHOS 81  --   --   --   --   BILITOT 0.5  --   --   --   --   GFRNONAA >60  --  >60 >60 51*  ANIONGAP 9  --  '9 10 8    '$ Lipids No results for input(s): CHOL, TRIG, HDL, LABVLDL, LDLCALC, CHOLHDL in the last 168 hours.  Hematology Recent Labs  Lab 12/09/21 0558 12/10/21 0059 12/11/21 0051  WBC 10.5 10.4 13.3*  RBC 4.45 4.16 4.42  HGB 12.3 11.4* 12.2  HCT 36.7 34.2* 35.5*  MCV 82.5 82.2 80.3  MCH 27.6 27.4 27.6  MCHC 33.5 33.3 34.4  RDW 13.6 13.5 13.2  PLT 322 318 362   Thyroid  Recent Labs  Lab 12/08/21 1648 12/09/21 0558  TSH 7.190*  --   FREET4  --  1.27*     BNP Recent Labs  Lab 12/08/21 1158  BNP 453.8*    DDimer  Recent Labs  Lab 12/08/21 1158  DDIMER 0.80*     Radiology    ECHOCARDIOGRAM COMPLETE  Result Date: 12/09/2021    ECHOCARDIOGRAM REPORT   Patient Name:   [name] [facility] Date of Exam: 12/09/2021 Medical Rec #:  [id number]         Height:       66.5 in Accession #:    [id number]        Weight:       207.9 lb Date of Birth:  11/29/1930        BSA:          2.044 m Patient Age:    88 years          BP:           139/93 mmHg Patient Gender: F                 HR:           93 bpm. Exam Location:  Inpatient Procedure: 2D Echo, Cardiac Doppler and Color Doppler Indications:    Atrial Fibrillation I48.91  History:        Patient has prior history of Echocardiogram examinations, most                 recent 03/04/2016. Arrythmias:RBBB, Signs/Symptoms:Dyspnea; Risk                 Factors:Hypertension.  Sonographer:    Bernadene Person RDCS Referring Phys: [id number] RONDELL A SMITH IMPRESSIONS  1. Left ventricular ejection fraction, by estimation, is 45%. The left ventricle has mildly decreased function. The left ventricle demonstrates global hypokinesis. Left ventricular diastolic parameters are indeterminate.  2. Right ventricular systolic function is mildly reduced. The right ventricular size is normal. There is mildly elevated pulmonary artery systolic  pressure. The estimated right ventricular systolic pressure is 62.5 mmHg.  3. Left atrial size was mildly dilated.  4. The mitral valve is abnormal. Moderate mitral valve regurgitation. No evidence of mitral stenosis.  5. The aortic valve is tricuspid. There is moderate calcification of the aortic valve. Aortic valve regurgitation is mild. Aortic valve sclerosis/calcification is present, without any evidence of aortic stenosis.  6. Aortic dilatation noted. There is mild dilatation of the ascending aorta, measuring 40 mm.  7. The inferior vena cava is dilated in size with <50% respiratory variability,  suggesting right atrial pressure of 15 mmHg.  8. There was a left pleural effusion.  9. The patient was in atrial fibrillation. FINDINGS  Left Ventricle: Left ventricular ejection fraction, by estimation, is 45%. The left ventricle has mildly decreased function. The left ventricle demonstrates global hypokinesis. The left ventricular internal cavity size was normal in size. There is no left ventricular hypertrophy. Left ventricular diastolic parameters are indeterminate. Right Ventricle: The right ventricular size is normal. No increase in right ventricular wall thickness. Right ventricular systolic function is mildly reduced. There is mildly elevated pulmonary artery systolic pressure. The tricuspid regurgitant velocity  is 2.32 m/s, and with an assumed right atrial pressure of 15 mmHg, the estimated right ventricular systolic pressure is 63.8 mmHg. Left Atrium: Left atrial size was mildly dilated. Right Atrium: Right atrial size was normal in size. Pericardium: There was a left pleural effusion. There is no evidence of pericardial effusion. Mitral Valve: The mitral valve is abnormal. There is mild calcification of the mitral valve leaflet(s). Moderate mitral valve regurgitation. No evidence of mitral valve stenosis. Tricuspid Valve: The tricuspid valve is normal in structure. Tricuspid valve regurgitation is mild. Aortic Valve: The aortic valve is tricuspid. There is moderate calcification of the aortic valve. Aortic valve regurgitation is mild. Aortic regurgitation PHT measures 523 msec. Aortic valve sclerosis/calcification is present, without any evidence of aortic stenosis. Pulmonic Valve: The pulmonic valve was normal in structure. Pulmonic valve regurgitation is not visualized. Aorta: The aortic root is normal in size and structure and aortic dilatation noted. There is mild dilatation of the ascending aorta, measuring 40 mm. Venous: The inferior vena cava is dilated in size with less than 50% respiratory  variability, suggesting right atrial pressure of 15 mmHg. IAS/Shunts: No atrial level shunt detected by color flow Doppler.  LEFT VENTRICLE PLAX 2D LVIDd:         4.80 cm LVIDs:         3.60 cm LV PW:         1.10 cm LV IVS:        1.00 cm LVOT diam:     2.20 cm LV SV:         48 LV SV Index:   23 LVOT Area:     3.80 cm  LV Volumes (MOD) LV vol d, MOD A2C: 98.7 ml LV vol d, MOD A4C: 78.6 ml LV vol s, MOD A2C: 53.7 ml LV vol s, MOD A4C: 47.2 ml LV SV MOD A2C:     45.0 ml LV SV MOD A4C:     78.6 ml LV SV MOD BP:      39.7 ml RIGHT VENTRICLE TAPSE (M-mode): 1.7 cm LEFT ATRIUM             Index        RIGHT ATRIUM           Index LA diam:  3.90 cm 1.91 cm/m   RA Area:     16.30 cm LA Vol (A2C):   65.6 ml 32.09 ml/m  RA Volume:   37.70 ml  18.44 ml/m LA Vol (A4C):   63.4 ml 31.01 ml/m LA Biplane Vol: 67.5 ml 33.02 ml/m  AORTIC VALVE LVOT Vmax:         81.73 cm/s LVOT Vmean:        53.333 cm/s LVOT VTI:          0.126 m AI PHT:            523 msec AR Vena Contracta: 0.30 cm  AORTA Ao Root diam: 3.50 cm Ao Asc diam:  4.00 cm MR Peak grad:    93.3 mmHg    TRICUSPID VALVE MR Mean grad:    57.0 mmHg    TR Peak grad:   21.5 mmHg MR Vmax:         483.00 cm/s  TR Vmax:        232.00 cm/s MR Vmean:        354.0 cm/s MR PISA:         1.01 cm     SHUNTS MR PISA Eff ROA: 8 mm        Systemic VTI:  0.13 m MR PISA Radius:  0.40 cm      Systemic Diam: 2.20 cm Dalton McleanMD Electronically signed by Franki Monte Signature Date/Time: 12/09/2021/7:57:33 PM    Final     Cardiac Studies   Echo 03/04/16 - Left ventricle: The cavity size was normal. Wall thickness was    normal. Systolic function was normal. The estimated ejection    fraction was in the range of 50% to 55%.  - Aortic valve: Calcified non coronary cusp. There was trivial    regurgitation.  - Mitral valve: There was mild regurgitation.  - Left atrium: The atrium was mildly dilated.  - Atrial septum: No defect or patent foramen ovale was identified.    Patient Profile     88 y.o. female with hypertension, hyperlipidemia, and newly diagnosed atrial fibrillation.  Assessment & Plan    # Paroxysmal atrial fibrillation: Newly noted this admission.  She reports 3 weeks of exertional dyspnea and edema.  I suspect this is the timeline of her new onset atrial fibrillation. She is now in sinus rhythm after DCCV. Transition to Eliquis.  Switch metoprolol to '100mg'$  succinate daily.  # Acute systolic diastolic heart failure: BNP elevated on admission.  VOlume status improving clinically.  UPO not accurate due to loss of urine in the bed.  However no significant weight change.  She never has any lower extremity edema.  JVD is difficult to assess.  Echo revealed LVEF 45%.  I suspect this is all in the setting of new onset atrial fibrillation.  She has not had any ischemic symptoms.  Given lack of her ischemic symptoms and her age, we will plan to repeat echo in 3 months.  At that point if her LVEF remains reduced would consider an ischemic evaluation.  At first her hyponatremia was thought to be hypervolemic.  Vital signs seems to be improved but her hyponatremia really has not made any improvement.  We will repeat her chest x-ray to make sure there is no sign of infection, especially given her wheezing.  Also wonder whether her fluoxetine could be contributing.  Check a BNP.  If significantly elevated will increase her diuretic as well.  # Hyponatremia: Hyponatremia is worsening with  diuresis.  This is odd, given that her fluid status seems to be improving.  Her fluoxetine could also contribute.  If she remains hyponatremic once more euvolemic, will need to consider changing her fluoxetine.   Repeat chest x-ray and BNP as above.      For questions or updates, please contact [place] Please consult [email or web address] for contact info under        Signed, [name], MD  12/11/2021, 1:46 PM

## 2021-12-11 NOTE — Anesthesia Preprocedure Evaluation (Addendum)
Anesthesia Evaluation  ?Patient identified by MRN, date of birth, ID band ?Patient awake ? ? ? ?Reviewed: ?Allergy & Precautions, H&P , NPO status , Patient's Chart, lab work & pertinent test results ? ?Airway ?Mallampati: III ? ?TM Distance: >3 FB ?Neck ROM: Full ? ? ? Dental ?no notable dental hx. ?(+) Teeth Intact, Dental Advisory Given ?  ?Pulmonary ?neg pulmonary ROS,  ?  ?Pulmonary exam normal ?breath sounds clear to auscultation ? ? ? ? ? ? Cardiovascular ?hypertension, Pt. on medications ?+CHF  ?+ dysrhythmias Atrial Fibrillation  ?Rhythm:Irregular Rate:Tachycardia ? ? ?  ?Neuro/Psych ?negative neurological ROS ? negative psych ROS  ? GI/Hepatic ?Neg liver ROS, GERD  Medicated,  ?Endo/Other  ?Hypothyroidism  ? Renal/GU ?negative Renal ROS  ?negative genitourinary ?  ?Musculoskeletal ? ?(+) Arthritis , Osteoarthritis,   ? Abdominal ?  ?Peds ? Hematology ? ?(+) Blood dyscrasia, anemia ,   ?Anesthesia Other Findings ? ? Reproductive/Obstetrics ?negative OB ROS ? ?  ? ? ? ? ? ? ? ? ? ? ? ? ? ?  ?  ? ? ? ? ? ? ? ?Anesthesia Physical ?Anesthesia Plan ? ?ASA: 3 ? ?Anesthesia Plan: General  ? ?Post-op Pain Management: Minimal or no pain anticipated  ? ?Induction: Intravenous ? ?PONV Risk Score and Plan: 4 or greater and Propofol infusion and Treatment may vary due to age or medical condition ? ?Airway Management Planned: Nasal Cannula and Natural Airway ? ?Additional Equipment:  ? ?Intra-op Plan:  ? ?Post-operative Plan:  ? ?Informed Consent: I have reviewed the patients History and Physical, chart, labs and discussed the procedure including the risks, benefits and alternatives for the proposed anesthesia with the patient or authorized representative who has indicated his/her understanding and acceptance.  ? ? ? ?Dental advisory given ? ?Plan Discussed with: CRNA ? ?Anesthesia Plan Comments:   ? ? ? ? ? ? ?Anesthesia Quick Evaluation ? ?

## 2021-12-11 NOTE — Hospital Course (Addendum)
[name] was admitted to the hospital with the working diagnosis of acute on chronic heart failure decompensation.  ? ?88 yo female with the past medical history of hypertension, dyslipidemia, and hypothyroid who presented with dyspnea. She reported dyspnea on exertion that became acutely worse at 4 am, prompting her to come to the hospital. She also reported recent worsening of cough and feeling palpitations. On her initial physical examination her blood pressure was 115/77, HR 109, RR 22 and 02 saturation 93%, heart with S1 and S2 present, irregularly irregular, with no gallops or rubs, lungs with intermittent rales and positive wheezing, abdomen soft and positive lower extremity edema.  ? ?Na 125, K 4,1 CL 93, bicarbonate 23, glucose 102, bun 13 cr 0,70 ?BNP 453 ?Wbc 8,7. Hgb 11,9 hct 36,5 Plt 323  ?D dimer 0,80 ?Sars covid 19 negative  ? ?Chest radiograph with increased lung markings bilaterally, small bilateral pleural effusions. ? ?EKG 115 bpm, normal axis, qtc 525, right bundle branch block, atrial fibrillation rhythm, no significant ST segment or T wave changes.   ? ?Patient was placed on furosemide for diuresis.  ? ?Cardiology was consulted with recommendations for cardioversion.  ?03/08 direct current cardioversion with conversion to sinus rhythm.  ? ?Patient with worsening hyponatremia, nephrology consulted.  ? ?Patient was placed on aggressive diuresis with furosemide for hypervolemia.  ? ?Slowly improving and recovering from hyponatremia.  ? ?

## 2021-12-11 NOTE — Assessment & Plan Note (Signed)
Calculated BMI is 33,0 ?Plan to encourage mobility, out of bed to chair tid with meals. ?Pt and Ot.  ?

## 2021-12-11 NOTE — Progress Notes (Signed)
PT Cancellation Note ? ?Patient Details ?Name: [name] ?MRN: [id number] ?DOB: 1931-06-24 ? ? ?Cancelled Treatment:    Reason Eval/Treat Not Completed: Patient at procedure or test/unavailable (endoscopy) ? ?[name], PT, DPT ?Acute Rehabilitation Services ?Pager [phone] ?Office [phone] ? ? ? ?[name] ?12/11/2021, 10:42 AM ? ? ?

## 2021-12-11 NOTE — Interval H&P Note (Signed)
History and Physical Interval Note: ? ?12/11/2021 ?9:21 AM ? ?[name]  has presented today for surgery, with the diagnosis of afib.  The various methods of treatment have been discussed with the patient and family. After consideration of risks, benefits and other options for treatment, the patient has consented to  Procedure(s): ?TRANSESOPHAGEAL ECHOCARDIOGRAM (TEE) (N/A) ?CARDIOVERSION (N/A) as a surgical intervention.  The patient's history has been reviewed, patient examined, no change in status, stable for surgery.  I have reviewed the patient's chart and labs.  Questions were answered to the patient's satisfaction.   ? ? ?Stephanie Mcglone A Deanza Upperman ? ? ?

## 2021-12-11 NOTE — Progress Notes (Signed)
?Progress Note ? ? ?Patient: NAVIYAH [name] YIR:[id number] DOB: 06/18/1931 DOA: 12/08/2021     3 ?DOS: the patient was seen and examined on 12/11/2021 ?  ?Brief hospital course: ?Mrs. Michaelson was admitted to the hospital with the working diagnosis of acute on chronic heart failure decompensation.  ? ?88 yo female with the past medical history of hypertension, dyslipidemia, and hypothyroid who presented with dyspnea. She reported dyspnea on exertion that became acutely worse at 4 am, prompting her to come to the hospital. She also reported recent worsening of cough and feeling palpitations. On her initial physical examination her blood pressure was 115/77, HR 109, RR 22 and 02 saturation 93%, heart with S1 and S2 present, irregularly irregular, with no gallops or rubs, lungs with intermittent rales and positive wheezing, abdomen soft and positive lower extremity edema.  ? ?Na 125, K 4,1 CL 93, bicarbonate 23, glucose 102, bun 13 cr 0,70 ?BNP 453 ?Wbc 8,7. Hgb 11,9 hct 36,5 Plt 323  ?D dimer 0,80 ?Sars covid 19 negative  ? ?Chest radiograph with increased lung markings bilaterally, small bilateral pleural effusions. ? ?EKG 115 bpm, normal axis, qtc 525, right bundle branch block, atrial fibrillation rhythm, no significant ST segment or T wave changes.   ? ?Patient was placed on furosemide for diuresis.  ? ?Cardiology was consulted with recommendations for cardioversion.  ?03/08 direct current cardioversion with conversion to sinus rhythm.  ? ?Assessment and Plan: ?Acute CHF (congestive heart failure) ([place]) ?Echocardiogram with LV EF 45% with global hypokinesis, mild reduction of RV systolic function, RSVP 50,0. Moderate mitral regurgitation. Mild dilatation of ascending aorta.  ? ?Patient continue volume overloaded.  ?Blood pressure in the 116 mmHg range systolic. ?Documented urine output 750 ml.  ? ?Plan to continue diuresis with furosemide (may need to increase dose). ?Continue blood pressure monitoring.  ? ? ?Atrial  fibrillation with RVR ([place]) ?Patient now sp cardioversion recovering sinus rhythm. ?Plan to continue rate control with metoprolol. ?Possible change anticoagulation from heparin to apixaban.  ? ?Essential hypertension ?Continue blood pressure monitoring. ?Continue with loop diuretic therapy for diuresis.  ? ?Hyponatremia ?Hypokalemia.  ?Renal function with serum cr at 1.0 with K at 4,4 and serum Na at 121. ?Clinically continue hypervolemic. ? ?Continue aggressive diuresis with IV furosemide, may need to increase dose to target further negative fluid balance.  ? ?Normocytic anemia ?hgb and hct have been stable with 12.2 and 35,5 respectively.  ? ?Hypothyroidism ?Patient with high TSH along with low free T4 and free T3. ?Continue with levothyroxine and follow up thyroid function test in 3 weeks as outpatient.  ? ? ?Class 1 obesity ?Calculated BMI is 33,0 ?Plan to encourage mobility, out of bed to chair tid with meals. ?Pt and Ot.  ? ?SIRS (systemic inflammatory response syndrome) (HCC)-resolved as of 12/11/2021 ?- Clinically do not suspect pneumonia, chest x-ray findings are suggestive of CHF and atelectasis ?-Afebrile, no leukocytosis, lactate and procalcitonin are normal ?-discontinued antibiotics yesterday ? ? ? ? ?  ? ?Subjective: Patient with improve dyspnea but not back to baseline, no chest pain.  ? ?Physical Exam: ?Vitals:  ? 12/11/21 1029 12/11/21 1040 12/11/21 1050 12/11/21 1113  ?BP: (!) 96/47 113/67 125/60 116/78  ?Pulse: (!) 53 (!) 56 (!) 57   ?Resp: '13 15 16 16  '$ ?Temp: (!) 97.2 ?F (36.2 ?C)   (!) 97.5 ?F (36.4 ?C)  ?TempSrc: Temporal   Oral  ?SpO2: 100% 94% 95%   ?Weight:      ?Height:      ? ?  Neurology awake and alert ?ENT with no pallor ?Cardiovascular with S1 and S2 present and rhythmic with no gallops or rubs, systolic murmur at the apex ?Positive moderate JVD ?Positive pitting bilateral lower extremity edema ++ ?Respiratory with positive wheezing and scattered rales, no rhonchi ?Abdomen protuberant but  not distended  ?Data Reviewed: ? ? ? ?Family Communication: no family at the bedside  ? ?Disposition: ?Status is: Inpatient ?Remains inpatient appropriate because: volume overload.  ? Planned Discharge Destination: Home ? ? ? ?Author: ?Tawni Millers, MD ?12/11/2021 12:24 PM ? ?For on call review [email or web address].  ?

## 2021-12-11 NOTE — Progress Notes (Addendum)
ANTICOAGULATION CONSULT NOTE  ? ?Pharmacy Consult for heparin >> apixaban  ?Indication: atrial fibrillation ? ?No Known Allergies ? ?Patient Measurements: ?Height: 5' 6.5" (168.9 cm) ?Weight: 94.2 kg (207 lb 10.8 oz) ?IBW/kg (Calculated) : 60.45 ?Heparin Dosing Weight: 81kg ? ?Vital Signs: ?Temp: 97.5 ?F (36.4 ?C) (03/08 1113) ?Temp Source: Oral (03/08 1113) ?BP: 116/78 (03/08 1113) ?Pulse Rate: 57 (03/08 1050) ? ?Labs: ?Recent Labs  ?  12/09/21 ?0076 12/10/21 ?0059 12/11/21 ?2263  ?HGB 12.3 11.4* 12.2  ?HCT 36.7 34.2* 35.5*  ?PLT 322 318 362  ?HEPARINUNFRC 0.35 0.35 0.54  ?CREATININE 0.86 0.87 1.05*  ? ? ?Estimated Creatinine Clearance: 43.3 mL/min (A) (by C-G formula based on SCr of 1.05 mg/dL (H)). ? ? ?Medical History: ?Past Medical History:  ?Diagnosis Date  ? Dyspnea   ? diastolic dysfunction by echo 2012  ? GERD (gastroesophageal reflux disease)   ? Hypertension   ? Mild mitral and aortic regurgitation   ? Osteoarthritis   ? Right bundle branch block 2014  ? Thyroid disease   ? ? ?Assessment: ?42 YOF presenting with SOB, in Afib with RVR. No anticoagulation prior to admission. Pharmacy consulted for heparin.   ? ?Heparin level 0.54 is therapeutic on 1200 units/hr. S/p TEE and DCCV 3/8. Plan eventual transition to [place].  ? ?Goal of Therapy:  ?Heparin level 0.3-0.7 units/ml ?Monitor platelets by anticoagulation protocol: Yes ?  ?Plan:  ?Continue heparin 1250 units/hr   ?Monitor daily heparin level, CBC ?Monitor for signs/symptoms of bleeding  ?F/u transition to apixaban  ? ?ADDENDUM 14:30:  consult to transition to apixaban. Patient does not meet criteria for dose reduction and does not seem to have excess bleeding risk. Will start apixaban '5mg'$  BID.  ? ?Consider discontinuing aspirin if no indication to lower bleed risk.  ?Benetta Spar, PharmD, BCPS, BCCP ?Clinical Pharmacist ? ?Please check AMION for all [place] phone numbers ?After 10:00 PM, call [place] [phone] ? ?

## 2021-12-11 NOTE — Progress Notes (Signed)
Physical Therapy Treatment ?Patient Details ?Name: [name] ?MRN: [id number] ?DOB: 02-17-1931 ?Today's Date: 12/11/2021 ? ? ?History of Present Illness Patient is a 88 y/o female who presents on 12/08/21 with SOB. Found to have new onset A-fib and acute respiratory distress secondary to CHF exacerbation and SIRS. CXR-bilateral interstitial thickening concerning for edema versus pneumonia in the left lower lobe. S/p cardioversion 3/8. PMH includes HTN and RBBB., mild AR/MR. ? ?  ?PT Comments  ? ? Pt s/p cardioversion and reporting fatigue; agreeable to limited therapy session. Pt able to participate in warm up exercises and ambulating 80 ft with a walker at a min guard assist level. Desaturation to 88% on 2L O2, but able to rebound with cues for pursed lip breathing. D/c plan remains appropriate.  ?   ?Recommendations for follow up therapy are one component of a multi-disciplinary discharge planning process, led by the attending physician.  Recommendations may be updated based on patient status, additional functional criteria and insurance authorization. ? ?Follow Up Recommendations ? Home health PT ?  ?  ?Assistance Recommended at Discharge Intermittent Supervision/Assistance  ?Patient can return home with the following Help with stairs or ramp for entrance;A little help with walking and/or transfers;A little help with bathing/dressing/bathroom;Assistance with cooking/housework ?  ?Equipment Recommendations ? None recommended by PT  ?  ?Recommendations for Other Services   ? ? ?  ?Precautions / Restrictions Precautions ?Precautions: Fall;Other (comment) ?Precaution Comments: watch HR/02 ?Restrictions ?Weight Bearing Restrictions: No  ?  ? ?Mobility ? Bed Mobility ?Overal bed mobility: Needs Assistance ?Bed Mobility: Supine to Sit, Sit to Supine ?  ?  ?Supine to sit: Supervision ?Sit to supine: Min assist ?  ?General bed mobility comments: MinA for LE assist back into bed ?  ? ?Transfers ?Overall transfer level:  Needs assistance ?Equipment used: Rolling walker (2 wheels) ?Transfers: Sit to/from Stand ?Sit to Stand: Min guard ?  ?  ?  ?  ?  ?  ?  ? ?Ambulation/Gait ?Ambulation/Gait assistance: Min guard ?Gait Distance (Feet): 80 Feet ?Assistive device: Rolling walker (2 wheels) ?Gait Pattern/deviations: Step-through pattern, Decreased stride length, Trunk flexed ?Gait velocity: decreased ?  ?  ?General Gait Details: Slow, overall steady gait with cues for pursed lip breathing ? ? ?Stairs ?  ?  ?  ?  ?  ? ? ?Wheelchair Mobility ?  ? ?Modified Rankin (Stroke Patients Only) ?  ? ? ?  ?Balance Overall balance assessment: Needs assistance ?Sitting-balance support: Feet supported, No upper extremity supported ?Sitting balance-Leahy Scale: Good ?  ?  ?Standing balance support: During functional activity ?Standing balance-Leahy Scale: Poor ?  ?  ?  ?  ?  ?  ?  ?  ?  ?  ?  ?  ?  ? ?  ?Cognition Arousal/Alertness: Awake/alert ?Behavior During Therapy: [facility] for tasks assessed/performed ?Overall Cognitive Status: Within Functional Limits for tasks assessed ?  ?  ?  ?  ?  ?  ?  ?  ?  ?  ?  ?  ?  ?  ?  ?  ?  ?  ?  ? ?  ?Exercises General Exercises - Lower Extremity ?Ankle Circles/Pumps: Both, 15 reps, Supine ?Long Arc Quad: Both, 10 reps, Seated ?Heel Slides: Both, 10 reps, Supine ? ?  ?General Comments   ?  ?  ? ?Pertinent Vitals/Pain Pain Assessment ?Pain Assessment: No/denies pain  ? ? ?Home Living   ?  ?  ?  ?  ?  ?  ?  ?  ?  ?   ?  ?  Prior Function    ?  ?  ?   ? ?PT Goals (current goals can now be found in the care plan section) Acute Rehab PT Goals ?Patient Stated Goal: to go home ?Potential to Achieve Goals: Good ?Progress towards PT goals: Progressing toward goals ? ?  ?Frequency ? ? ? Min 3X/week ? ? ? ?  ?PT Plan Current plan remains appropriate  ? ? ?Co-evaluation   ?  ?  ?  ?  ? ?  ?AM-PAC PT "6 Clicks" Mobility   ?Outcome Measure ? Help needed turning from your back to your side while in a flat bed without using bedrails?:  None ?Help needed moving from lying on your back to sitting on the side of a flat bed without using bedrails?: A Little ?Help needed moving to and from a bed to a chair (including a wheelchair)?: A Little ?Help needed standing up from a chair using your arms (e.g., wheelchair or bedside chair)?: A Little ?Help needed to walk in hospital room?: A Little ?Help needed climbing 3-5 steps with a railing? : A Lot ?6 Click Score: 18 ? ?  ?End of Session Equipment Utilized During Treatment: Oxygen ?Activity Tolerance: Patient tolerated treatment well ?Patient left: in bed;with call bell/phone within reach;with family/visitor present ?Nurse Communication: Mobility status ?PT Visit Diagnosis: Muscle weakness (generalized) (M62.81);Difficulty in walking, not elsewhere classified (R26.2) ?  ? ? ?Time: 4235-3614 ?PT Time Calculation (min) (ACUTE ONLY): 23 min ? ?Charges:  $Therapeutic Activity: 23-37 mins          ?          ? ?[name], PT, DPT ?Acute Rehabilitation Services ?Pager [phone] ?Office [phone] ? ? ? ?[name] ?12/11/2021, 4:59 PM ? ?

## 2021-12-11 NOTE — CV Procedure (Signed)
? ? ?  TRANSESOPHAGEAL ECHOCARDIOGRAM  ? ?NAME:  [name]    ?MRN: [id number] ?DOB:  Oct 20, 1930    ?ADMIT DATE: 12/08/2021 ? ?INDICATIONS: ?TEE/DCCV ? ?PROCEDURE:  ? ?Informed consent was obtained prior to the procedure. The risks, benefits and alternatives for the procedure were discussed and the patient comprehended these risks.  Risks include, but are not limited to, cough, sore throat, vomiting, nausea, somnolence, esophageal and stomach trauma or perforation, bleeding, low blood pressure, aspiration, pneumonia, infection, trauma to the teeth and death.   ? ?Procedural time out performed. The oropharynx was anesthetized with topical 1% benzocaine.   ? ?Anesthesia was administered by Dr. Ola Spurr and team.  The patient was administered a total of Propofol 265 mg, to achieve and maintain moderate to deep conscious sedation.  The patient's heart rate, blood pressure, and oxygen saturation are monitored continuously during the procedure. ? ?The transesophageal probe was inserted in the esophagus and stomach without difficulty and multiple views were obtained.  ? ?COMPLICATIONS:   ? ?There were no immediate complications. ? ?KEY FINDINGS: ? ?Patent left atrial appendage ?Pleural effusion ?Full report to follow. ?Further management per primary team.  ? ?[name], MD ?[place]  ?10:18 AM ? ? ? ?Electrical Cardioversion Procedure Note ?[name] ?[id number] ?1931-07-15 ? ?Procedure: Electrical Cardioversion ?Indications:  Atrial Fibrillation ? ?Time Out: Verified patient identification, verified procedure,medications/allergies/relevent history reviewed, required imaging and test results available.  Performed ? ?Procedure Details ? ?The patient was NPO after midnight.   Cardioversion was done with synchronized biphasic defibrillation with AP pads with 200  Joules.  The patient converted to normal sinus rhythm on second attempt. The patient tolerated the procedure well   ? ?IMPRESSION: ? ?Successful cardioversion of atrial fibrillation. ? ?Post op has small run of atrial fibrillation that resolved without further intervention. ? ? ? ?[name] A [name] ?12/11/2021, 10:19 AM ? ? ?

## 2021-12-11 NOTE — Care Management Important Message (Signed)
Important Message ? ?Patient Details  ?Name: [name] ?MRN: [id number] ?Date of Birth: 1931/01/22 ? ? ?Medicare Important Message Given:  Yes ? ? ? ? ?Brianna Esson ?12/11/2021, 1:51 PM ?

## 2021-12-11 NOTE — Progress Notes (Signed)
?  Echocardiogram ?Echocardiogram Transesophageal has been performed. ? ?[name] ?12/11/2021, 10:26 AM ?

## 2021-12-11 NOTE — Transfer of Care (Signed)
Immediate Anesthesia Transfer of Care Note ? ?Patient: PATRIZIA PAULE ? ?Procedure(s) Performed: TRANSESOPHAGEAL ECHOCARDIOGRAM (TEE) ?CARDIOVERSION ?2 ?Patient Location: Endoscopy Unit ? ?Anesthesia Type:MAC ? ?Level of Consciousness: drowsy ? ?Airway & Oxygen Therapy: Patient Spontanous Breathing and Patient connected to nasal cannula oxygen ? ?Post-op Assessment: Report given to RN and Post -op Vital signs reviewed and stable ? ?Post vital signs: Reviewed and stable ? ?Last Vitals:  ?Vitals Value Taken Time  ?BP 96/47   ?Temp    ?Pulse 50   ?Resp 12   ?SpO2 100%   ? ? ?Last Pain:  ?Vitals:  ? 12/11/21 0911  ?TempSrc: Temporal  ?PainSc: 0-No pain  ?   ? ?  ? ?Complications: No notable events documented. ?

## 2021-12-11 NOTE — Assessment & Plan Note (Addendum)
Continue with entresto and metoprolol.  ?Continue close blood pressure monitoring.  ?

## 2021-12-12 ENCOUNTER — Inpatient Hospital Stay (HOSPITAL_COMMUNITY): Payer: Medicare HMO

## 2021-12-12 ENCOUNTER — Encounter (HOSPITAL_COMMUNITY): Payer: Self-pay | Admitting: Internal Medicine

## 2021-12-12 DIAGNOSIS — E871 Hypo-osmolality and hyponatremia: Secondary | ICD-10-CM | POA: Diagnosis not present

## 2021-12-12 DIAGNOSIS — I10 Essential (primary) hypertension: Secondary | ICD-10-CM | POA: Diagnosis not present

## 2021-12-12 DIAGNOSIS — I4891 Unspecified atrial fibrillation: Secondary | ICD-10-CM | POA: Diagnosis not present

## 2021-12-12 DIAGNOSIS — I5041 Acute combined systolic (congestive) and diastolic (congestive) heart failure: Secondary | ICD-10-CM | POA: Diagnosis not present

## 2021-12-12 LAB — URINALYSIS, COMPLETE (UACMP) WITH MICROSCOPIC
Bacteria, UA: NONE SEEN
Bilirubin Urine: NEGATIVE
Glucose, UA: NEGATIVE mg/dL
Hgb urine dipstick: NEGATIVE
Ketones, ur: NEGATIVE mg/dL
Leukocytes,Ua: NEGATIVE
Nitrite: NEGATIVE
Protein, ur: NEGATIVE mg/dL
Specific Gravity, Urine: 1.008 (ref 1.005–1.030)
pH: 6 (ref 5.0–8.0)

## 2021-12-12 LAB — BASIC METABOLIC PANEL
Anion gap: 11 (ref 5–15)
Anion gap: 13 (ref 5–15)
BUN: 16 mg/dL (ref 8–23)
BUN: 16 mg/dL (ref 8–23)
CO2: 21 mmol/L — ABNORMAL LOW (ref 22–32)
CO2: 24 mmol/L (ref 22–32)
Calcium: 8.7 mg/dL — ABNORMAL LOW (ref 8.9–10.3)
Calcium: 8.9 mg/dL (ref 8.9–10.3)
Chloride: 85 mmol/L — ABNORMAL LOW (ref 98–111)
Chloride: 83 mmol/L — ABNORMAL LOW (ref 98–111)
Creatinine, Ser: 1.03 mg/dL — ABNORMAL HIGH (ref 0.44–1.00)
Creatinine, Ser: 1.07 mg/dL — ABNORMAL HIGH (ref 0.44–1.00)
GFR, Estimated: 52 mL/min — ABNORMAL LOW (ref >60)
GFR, Estimated: 50 mL/min — ABNORMAL LOW (ref >60)
Glucose, Bld: 116 mg/dL — ABNORMAL HIGH (ref 70–99)
Glucose, Bld: 95 mg/dL (ref 70–99)
Potassium: 4 mmol/L (ref 3.5–5.1)
Potassium: 3.9 mmol/L (ref 3.5–5.1)
Sodium: 117 mmol/L — CL (ref 135–145)
Sodium: 120 mmol/L — ABNORMAL LOW (ref 135–145)

## 2021-12-12 LAB — OSMOLALITY, URINE: Osmolality, Ur: 298 mOsm/kg — ABNORMAL LOW (ref 300–900)

## 2021-12-12 LAB — MAGNESIUM: Magnesium: 1.6 mg/dL — ABNORMAL LOW (ref 1.7–2.4)

## 2021-12-12 LAB — TROPONIN I (HIGH SENSITIVITY): Troponin I (High Sensitivity): 10 ng/L (ref <18)

## 2021-12-12 LAB — SODIUM, URINE, RANDOM: Sodium, Ur: 51 mmol/L

## 2021-12-12 MED ORDER — LORAZEPAM 0.5 MG PO TABS
0.5000 mg | ORAL_TABLET | Freq: Three times a day (TID) | ORAL | Status: DC | PRN
  Administered 2021-12-12 – 2021-12-21 (×10): 0.5 mg via ORAL
  Filled 2021-12-12 (×10): qty 1

## 2021-12-12 MED ORDER — MAGNESIUM SULFATE 2 GM/50ML IV SOLN
2.0000 g | Freq: Once | INTRAVENOUS | Status: AC
  Administered 2021-12-12: 12:00:00 2 g via INTRAVENOUS
  Filled 2021-12-12: qty 50

## 2021-12-12 MED ORDER — POTASSIUM CHLORIDE CRYS ER 20 MEQ PO TBCR
20.0000 meq | EXTENDED_RELEASE_TABLET | Freq: Every day | ORAL | Status: DC
  Administered 2021-12-12 – 2021-12-16 (×5): 20 meq via ORAL
  Filled 2021-12-12 (×5): qty 1

## 2021-12-12 MED ORDER — FUROSEMIDE 10 MG/ML IJ SOLN
160.0000 mg | Freq: Two times a day (BID) | INTRAVENOUS | Status: DC
  Administered 2021-12-12 – 2021-12-13 (×3): 160 mg via INTRAVENOUS
  Filled 2021-12-12: qty 10
  Filled 2021-12-12: qty 16
  Filled 2021-12-12: qty 6
  Filled 2021-12-12: qty 16

## 2021-12-12 NOTE — Progress Notes (Signed)
?Progress Note ? ? ?Patient: [name] JQB:[id number] DOB: 12/09/1930 DOA: 12/08/2021     4 ?DOS: the patient was seen and examined on 12/12/2021 ?  ?Brief hospital course: ?Mrs. Burd was admitted to the hospital with the working diagnosis of acute on chronic heart failure decompensation.  ? ?88 yo female with the past medical history of hypertension, dyslipidemia, and hypothyroid who presented with dyspnea. She reported dyspnea on exertion that became acutely worse at 4 am, prompting her to come to the hospital. She also reported recent worsening of cough and feeling palpitations. On her initial physical examination her blood pressure was 115/77, HR 109, RR 22 and 02 saturation 93%, heart with S1 and S2 present, irregularly irregular, with no gallops or rubs, lungs with intermittent rales and positive wheezing, abdomen soft and positive lower extremity edema.  ? ?Na 125, K 4,1 CL 93, bicarbonate 23, glucose 102, bun 13 cr 0,70 ?BNP 453 ?Wbc 8,7. Hgb 11,9 hct 36,5 Plt 323  ?D dimer 0,80 ?Sars covid 19 negative  ? ?Chest radiograph with increased lung markings bilaterally, small bilateral pleural effusions. ? ?EKG 115 bpm, normal axis, qtc 525, right bundle branch block, atrial fibrillation rhythm, no significant ST segment or T wave changes.   ? ?Patient was placed on furosemide for diuresis.  ? ?Cardiology was consulted with recommendations for cardioversion.  ?03/08 direct current cardioversion with conversion to sinus rhythm.  ? ?Patient with worsening hyponatremia, nephrology consulted.  ? ?Assessment and Plan: ?Acute CHF (congestive heart failure) ([place]) ?Echocardiogram with LV EF 45% with global hypokinesis, mild reduction of RV systolic function, RSVP 40,9. Moderate mitral regurgitation. Mild dilatation of ascending aorta.  ? ?Urine output is 700 cc over last 24 hrs.  ?Negative fluid balance since admission 3,644.  ?Chest radiograph with bilateral interstitial infiltrates with hilar vascular congestion,  small bilateral pleural effusions. (personally reviewed).  ?Blood pressure 140 to 150 mmHg.  ? ?Patient was placed on Bipap last night due to respiratory distress. ? ?This am back on nasal cannula 3L/min for her acute hypoxemic respiratory failure.  ? ?Plan to continue diuresis with furosemide.  ? ? ? ?Atrial fibrillation with RVR ([place]) ?Patient now sp cardioversion recovered sinus rhythm. ?Continue rate control with metoprolol and anticoagulation with apixaban.  ?Her heart rate has been in the 60's  ?  ? ?Essential hypertension ?Her blood pressure has been stable, tolerating well metoprolol. ?Continue diuresis for heart failure decompensation.  ? ?Hyponatremia ?Hypokalemia/ hypomagnesemia.  ? ?To my examination her volume has improved from yesterday. ?Possible not accurate urine output.  ? ?Her renal function today with a serum cr at 1.07 with K at 3,9 and Na 120, Mg 1,6 ? ?Plan to add mag sulfate and continue diuresis with furosemide.  ?Consult nephrology for hyponatremia, possible component of SIADH, may benefit from oral urea.  ? ?Normocytic anemia ?hgb and hct have been stable with 12.2 and 35,5 respectively.  ?Stable cell count. ?Reactive leukocytosis at 13.3 with no signs of bacterial infection.  ? ?Hypothyroidism ?Patient with high TSH along with low free T4 and free T3. ?Continue with levothyroxine and follow up thyroid function test in 3 weeks as outpatient.  ? ? ?Class 1 obesity ?Calculated BMI is 33,0 ?Plan to encourage mobility, out of bed to chair tid with meals. ?Pt and Ot.  ? ?SIRS (systemic inflammatory response syndrome) (HCC)-resolved as of 12/11/2021 ?- Clinically do not suspect pneumonia, chest x-ray findings are suggestive of CHF and atelectasis ?-Afebrile, no leukocytosis, lactate and procalcitonin  are normal ?-discontinued antibiotics yesterday ? ? ? ? ?  ? ?Subjective: Patient with severe dyspnea last night and required non invasive mechanical ventilation. This am back on nasal cannula.   ? ?Physical Exam: ?Vitals:  ? 12/12/21 0331 12/12/21 0407 12/12/21 0730 12/12/21 0746  ?BP: (!) 141/73  (!) 150/77   ?Pulse: 65 64 65   ?Resp: (!) 22 (!) 22 (!) 29   ?Temp: 97.6 ?F (36.4 ?C)  98.1 ?F (36.7 ?C)   ?TempSrc: Oral     ?SpO2: 92% 98% 93% 91%  ?Weight:      ?Height:      ? ?Neurology awake and alert ?ENT with no pallor ?Cardiovascular with S1 and S2 present and rhythmic, with no gallops, positive murmur at the apex. ?Mild JVD ?Trace lower extremity edema.  ?Respiratory with mild wheezing bilaterally with scattered rales.   ?Abdomen soft and non tender.  ?Data Reviewed: ? ? ? ?Family Communication: I was not able to reach her daughter over the phone, I left a message.  ? ?Disposition: ?Status is: Inpatient ?Remains inpatient appropriate because: respiratory failure  ? Planned Discharge Destination: Home ? ?Author: ?Tawni Millers, MD ?12/12/2021 9:15 AM ? ?For on call review [email or web address].  ?

## 2021-12-12 NOTE — Progress Notes (Signed)
At 2152, pt was given ativan for anxiety.  Around 2300, pt started to feel a little SOB was asked to sit up in the chair. I stat the pt head up in the bed and assessed her. Lungs sounded wheezy and some coarse crackles in the base of her lungs. RT was called and administered PRN albuterol. Pt stated it help but only a few seconds. at 0046. I was notified by labs that the pt sodium levels ere at 117. I paged the doctor via [place] and was given the order to recheck labs and continue to monitor pt LOC. Around 0210 the pt complained of SOB. I called respiratory and was advised to get an order for PRN BiPAP. I paged the on call  TRH MD and was given the order of a stat EKG and for a chest XRAY to be done. Dr. Cyd Silence came to bedside and explained the situation to the pt. Dr. Cyd Silence gave the order for PRN bipap and to continue monitoring as well as the pt labs to be redone. Pt currently on bipap and states that she feels better. Awaiting lab results. ?

## 2021-12-12 NOTE — Progress Notes (Signed)
[facility] Renal Consultation Note  Requesting MD: Mauricio Arrien Indication for Consultation: Hyponatremia   HPI:  [name] is a 88 y.o. female with phmx of HTN, HLD, hypothroid who presented to the ED on 3/5 for dyspnea and admitted for acute on chronic HF and afib w/ RVR (cardioverted 3/8). TEE EF of 45%. Nephrology was consulted d/t the patient's ongoing hyponatremia.   Sodium of 125 at admission. Downtrended to 117 early this AM. Last at 120. On exam, today she appears hypervolemic. Denies headache, N/D, dizziness, fatigue.   Potassium 3.9 chloride 83 CO2 24 BUN 16 creatinine 1.07 glucose 95 calcium 8.9 Hgb 12.2  Creatinine, Ser  Date/Time Value Ref Range Status  12/12/2021 05:18 AM 1.07 (H) 0.44 - 1.00 mg/dL Final  12/12/2021 12:46 AM 1.03 (H) 0.44 - 1.00 mg/dL Final  12/11/2021 12:51 AM 1.05 (H) 0.44 - 1.00 mg/dL Final  12/10/2021 12:59 AM 0.87 0.44 - 1.00 mg/dL Final  12/09/2021 05:58 AM 0.86 0.44 - 1.00 mg/dL Final  12/08/2021 11:58 AM 0.70 0.44 - 1.00 mg/dL Final   PMHx:   Past Medical History:  Diagnosis Date   Dyspnea    diastolic dysfunction by echo 2012   GERD (gastroesophageal reflux disease)    Hypertension    Mild mitral and aortic regurgitation    Osteoarthritis    Right bundle branch block 2014   Thyroid disease     Past Surgical History:  Procedure Laterality Date   arm surgery     following an accident   KNEE SURGERY     Following an accident   TONSILLECTOMY AND ADENOIDECTOMY     VAGINAL HYSTERECTOMY      Family Hx:  Family History  Problem Relation Age of Onset   Heart attack Mother     Social History:  reports that she has never smoked. She has never used smokeless tobacco. She reports that she does not drink alcohol and does not use drugs.  Allergies: No Known Allergies  Medications: Prior to Admission medications   Medication Sig Start Date End Date Taking? Authorizing Provider  amLODipine (NORVASC) 2.5 MG  tablet Take 2.5 mg by mouth daily.   Yes [provider]  aspirin 81 MG tablet Take 81 mg by mouth daily.   Yes [provider]  Calcium Carbonate-Vitamin D 500-125 MG-UNIT TABS Take 1 tablet by mouth every evening.   Yes [provider]  Cholecalciferol (VITAMIN D) 50 MCG (2000 UT) CAPS Take 2,000 Units by mouth daily. 03/26/21  Yes [provider]  FLUoxetine (PROZAC) 40 MG capsule Take 40 mg by mouth every morning.   Yes [provider]  fluticasone (FLONASE) 50 MCG/ACT nasal spray Place 1 spray into both nostrils daily as needed for allergies or rhinitis.   Yes [provider]  levothyroxine (SYNTHROID, LEVOTHROID) 150 MCG tablet Take 150-225 mcg by mouth See admin instructions. Takes 150 mg on Thursday, Friday and Saturday and 225 mg on all other days.   Yes [provider]  LORazepam (ATIVAN) 0.5 MG tablet Take 0.5 mg by mouth daily as needed for anxiety. 03/18/21  Yes [provider]  lovastatin (MEVACOR) 40 MG tablet Take 60 mg by mouth at bedtime.   Yes [provider]  Multiple Vitamin (MULTIVITAMIN WITH MINERALS) TABS tablet Take 1 tablet by mouth every evening.   Yes [provider]  Multiple Vitamins-Minerals ([place]) CAPS Take 1 capsule by mouth every evening.   Yes [provider]  naproxen (NAPROSYN) 500 MG tablet Take 500 mg by mouth daily as needed for moderate pain. 07/24/21  Yes [provider]  Omega-3 Fatty Acids (FISH OIL) 1200 MG CAPS Take by mouth.   Yes [provider]  omeprazole (PRILOSEC) 10 MG capsule Take 10 mg by mouth every evening. 10/16/21  Yes [provider]  Thiamine HCl (VITAMIN B-1) 250 MG tablet Take 125 mg by mouth every evening.   Yes [provider]  vitamin B-12 (CYANOCOBALAMIN) 1000 MCG tablet Take 1,000 mcg by mouth daily.   Yes [provider]    I have reviewed the patient's current  medications.  Labs:  Results for orders placed or performed during the hospital encounter of 12/08/21 (from the past 48 hour(s))  Basic metabolic panel     Status: Abnormal   Collection Time: 12/11/21 12:51 AM  Result Value Ref Range   Sodium 121 (L) 135 - 145 mmol/L    Comment: REPEATED TO VERIFY   Potassium 4.4 3.5 - 5.1 mmol/L    Comment: DELTA CHECK NOTED   Chloride 86 (L) 98 - 111 mmol/L   CO2 23 22 - 32 mmol/L   Glucose, Bld 125 (H) 70 - 99 mg/dL    Comment: Glucose reference range applies only to samples taken after fasting for at least 8 hours.   BUN 13 8 - 23 mg/dL   Creatinine, Ser 1.05 (H) 0.44 - 1.00 mg/dL   Calcium 8.5 (L) 8.9 - 10.3 mg/dL   GFR, Estimated 51 (L) >60 mL/min    Comment: (NOTE) Calculated using the CKD-EPI Creatinine Equation (2021)    Anion gap 8 5 - 15    Comment: Performed at [facility] [address], [place], [place] [address]  Heparin level (unfractionated)     Status: None   Collection Time: 12/11/21 12:51 AM  Result Value Ref Range   Heparin Unfractionated 0.54 0.30 - 0.70 IU/mL    Comment: (NOTE) The clinical reportable range upper limit is being lowered to >1.10 to align with the FDA approved guidance for the current laboratory assay.  If heparin results are below expected values, and patient dosage has  been confirmed, suggest follow up testing of antithrombin III levels. Performed at [facility], [place] [address] [place], [place] [address]   CBC     Status: Abnormal   Collection Time: 12/11/21 12:51 AM  Result Value Ref Range   WBC 13.3 (H) 4.0 - 10.5 K/uL   RBC 4.42 3.87 - 5.11 MIL/uL   Hemoglobin 12.2 12.0 - 15.0 g/dL   HCT 35.5 (L) 36.0 - 46.0 %   MCV 80.3 80.0 - 100.0 fL   MCH 27.6 26.0 - 34.0 pg   MCHC 34.4 30.0 - 36.0 g/dL   RDW 13.2 11.5 - 15.5 %   Platelets 362 150 - 400 K/uL   nRBC 0.0 0.0 - 0.2 %    Comment: Performed at [facility], [place] [address] [place], [place] [address]  Brain natriuretic  peptide     Status: Abnormal   Collection Time: 12/11/21  2:05 PM  Result Value Ref Range   B Natriuretic Peptide 733.6 (H) 0.0 - 100.0 pg/mL    Comment: Performed at [facility] [address] [place],  [address]  Basic metabolic panel     Status: Abnormal   Collection Time: 12/12/21 12:46 AM  Result Value Ref Range   Sodium 117 (LL) 135 - 145 mmol/L  Comment: CRITICAL RESULT CALLED TO, READ BACK BY AND VERIFIED WITH: TOM JOHNSON,H,RN 12/12/21 0120 WAYK    Potassium 4.0 3.5 - 5.1 mmol/L   Chloride 85 (L) 98 - 111 mmol/L   CO2 21 (L) 22 - 32 mmol/L   Glucose, Bld 116 (H) 70 - 99 mg/dL    Comment: Glucose reference range applies only to samples taken after fasting for at least 8 hours.   BUN 16 8 - 23 mg/dL   Creatinine, Ser 1.03 (H) 0.44 - 1.00 mg/dL   Calcium 8.7 (L) 8.9 - 10.3 mg/dL   GFR, Estimated 52 (L) >60 mL/min    Comment: (NOTE) Calculated using the CKD-EPI Creatinine Equation (2021)    Anion gap 11 5 - 15    Comment: Performed at [facility] [address] [place], [place] [address]  Troponin I (High Sensitivity)     Status: None   Collection Time: 12/12/21  5:18 AM  Result Value Ref Range   Troponin I (High Sensitivity) 10 <18 ng/L    Comment: (NOTE) Elevated high sensitivity troponin I (hsTnI) values and significant  changes across serial measurements may suggest ACS but many other  chronic and acute conditions are known to elevate hsTnI results.  Refer to the "Links" section for chest pain algorithms and additional  guidance. Performed at [facility], [place] [address] [place], [place] [address]   Basic metabolic panel     Status: Abnormal   Collection Time: 12/12/21  5:18 AM  Result Value Ref Range   Sodium 120 (L) 135 - 145 mmol/L   Potassium 3.9 3.5 - 5.1 mmol/L   Chloride 83 (L) 98 - 111 mmol/L   CO2 24 22 - 32 mmol/L   Glucose, Bld 95 70 - 99 mg/dL    Comment: Glucose reference range applies only to samples taken after  fasting for at least 8 hours.   BUN 16 8 - 23 mg/dL   Creatinine, Ser 1.07 (H) 0.44 - 1.00 mg/dL   Calcium 8.9 8.9 - 10.3 mg/dL   GFR, Estimated 50 (L) >60 mL/min    Comment: (NOTE) Calculated using the CKD-EPI Creatinine Equation (2021)    Anion gap 13 5 - 15    Comment: Performed at [facility] [address], [place], [place] [address]  Magnesium     Status: Abnormal   Collection Time: 12/12/21  5:18 AM  Result Value Ref Range   Magnesium 1.6 (L) 1.7 - 2.4 mg/dL    Comment: Performed at [facility] [address] [place], [place] [address]   ROS:  Pertinent items are noted in HPI.  Physical Exam: Vitals:   12/12/21 0730 12/12/21 0746  BP: (!) 150/77   Pulse: 65   Resp: (!) 29   Temp: 98.1 F (36.7 C)   SpO2: 93% 91%     General exam: Elderly pleasant female sitting up in bed, AAOx3 HEENT: Positive JVD CVS: S1-S2, regular rhythm Lungs: Fine basilar Rales, few wheezes noted Abdomen: Soft, nontender, bowel sounds present Extremities: 1+ edema Skin: No rashes Psychiatry: Judgement and insight appear normal. Mood & affect appropriate.   Assessment/Plan: 1.Hyponatremia likely hypervolemic in setting of HF -Order Urine osmolality, urine sodium, UA, cortisol for further workup  2. Hypertension/volume   -Hypervolemic; started IV '160mg'$  lasix in D5% BID; anticipate this will also help correct the hyponatremia  -monitor UOP, wgts, BMPs -Continue metoprolol   I have seen and examined this patient and  agree with the plan of care. Urine sodium 51 and urine osm 298. This is interesting as I would expect a lower urine osm and higher urine sodium with diuretics. There appears some volume overload with peripheral edema. We shall continue to follow. Cortisol is pending -. Will continue diuresis for now and fluid restrict. Sherril Croon 12/12/2021, 6:55 PM   [place] 12/12/2021, 12:01 PM

## 2021-12-12 NOTE — Progress Notes (Signed)
HOSPITAL MEDICINE OVERNIGHT EVENT NOTE   ? ?Notified by nursing that patient is complaining of worsening shortness of breath and exhibiting increasing agitation.   Respiratory was called to the bedside by nursing and voiced their concern over the patient's work of breathing. ? ?I promptly went to evaluate the patient at the bedside  and found the patient to be tachepnic and somewhat anxious.  Patient was AAO x 3 and denied associated chest pain.  Heart sounds were regular and rate was controlled.  Lung exam revealed bibasilar and mid field rales with intermittent expiratory wheezing. ? ?Review of chart reveals that the patient presented in cardiogenic volume overload secondary to atrial fibrillation and diastolic heart failure.  Patient is now S/P DCCV. ? ?Stat repeat chest xray obtained revealing pulmonary edema, similar in appearance to the day prior.  Review of overnight chemistry reveals a surprising drop in sodium to 117.  This is possible diuretic induced. ? ?Considering the significant drop in sodium, will repeat chemistry.  Will avoid further dosing of diuretics for the moment until we get this result.  For the patient's work of breathing will temporarily place patient on BiPAP in the meantime which can be quickly weaned off in the morning. ? ?Vernelle Emerald  MD ?[facility]  ? ? ? ? ? ? ? ? ? ? ?

## 2021-12-12 NOTE — Progress Notes (Signed)
Progress Note  Patient Name: [name] Date of Encounter: 12/12/2021  [facility] Cardiologist: None   Subjective   She is still short of breath.  Had to use BiPAP overnight. Wonders if it is her anxiety.  Inpatient Medications    Scheduled Meds:  apixaban  5 mg Oral BID   aspirin EC  81 mg Oral Daily   furosemide  80 mg Intravenous BID   levalbuterol  0.63 mg Nebulization TID   levothyroxine  200 mcg Oral Q0600   mouth rinse  15 mL Mouth Rinse BID   metoprolol succinate  100 mg Oral Daily   metoprolol tartrate  50 mg Oral BID   pravastatin  40 mg Oral q1800   sodium chloride flush  3 mL Intravenous Q12H   vitamin B-1  125 mg Oral QPM   Continuous Infusions:  sodium chloride     PRN Meds: sodium chloride, acetaminophen, albuterol, fluticasone, LORazepam, ondansetron (ZOFRAN) IV, sodium chloride flush   Vital Signs    Vitals:   12/12/21 0331 12/12/21 0407 12/12/21 0730 12/12/21 0746  BP: (!) 141/73  (!) 150/77   Pulse: 65 64 65   Resp: (!) 22 (!) 22 (!) 29   Temp: 97.6 F (36.4 C)  98.1 F (36.7 C)   TempSrc: Oral     SpO2: 92% 98% 93% 91%  Weight:      Height:        Intake/Output Summary (Last 24 hours) at 12/12/2021 0826 Last data filed at 12/11/2021 2300 Gross per 24 hour  Intake --  Output 700 ml  Net -700 ml   Last 3 Weights 12/11/2021 12/10/2021 12/09/2021  Weight (lbs) 207 lb 10.8 oz 207 lb 3.7 oz 207 lb 14.3 oz  Weight (kg) 94.2 kg 94 kg 94.3 kg      Telemetry    Sinus rhythm.  No events. - Personally Reviewed  ECG    Sinus rhythm.  Rate 66 bpm. Right bundle branch block.- Personally Reviewed  Physical Exam   VS:  BP (!) 150/77 (BP Location: Right Arm)    Pulse 65    Temp 98.1 F (36.7 C)    Resp (!) 29    Ht 5' 6.5" (1.689 m)    Wt 94.2 kg    SpO2 91%    BMI 33.02 kg/m  , BMI Body mass index is 33.02 kg/m. GENERAL:  Well appearing HEENT: Pupils equal round and reactive, fundi not visualized, oral mucosa unremarkable NECK:  No  jugular venous distention, waveform within normal limits, carotid upstroke brisk and symmetric, no bruits, no thyromegaly LUNGS: Diffuse expiratory wheezing.  No rhonchi or crackles. HEART:  RRR.  PMI not displaced or sustained,S1 and S2 within normal limits, no S3, no S4, no clicks, no rubs, no murmurs ABD:  Flat, positive bowel sounds normal in frequency in pitch, no bruits, no rebound, no guarding, no midline pulsatile mass, no hepatomegaly, no splenomegaly EXT:  2 plus pulses throughout, trace lower extremity edema, no cyanosis no clubbing SKIN:  No rashes no nodules NEURO:  Cranial nerves II through XII grossly intact, motor grossly intact throughout PSYCH:  Cognitively intact, oriented to person place and time  Labs    High Sensitivity Troponin:   Recent Labs  Lab 12/08/21 1158 12/12/21 0518  TROPONINIHS 6 10     Chemistry Recent Labs  Lab 12/08/21 1158 12/08/21 1648 12/09/21 0558 12/11/21 0051 12/12/21 0046 12/12/21 0518  NA 125*  --    < >  121* 117* 120*  K 4.1  --    < > 4.4 4.0 3.9  CL 93*  --    < > 86* 85* 83*  CO2 23  --    < > 23 21* 24  GLUCOSE 102*  --    < > 125* 116* 95  BUN 13  --    < > '13 16 16  '$ CREATININE 0.70  --    < > 1.05* 1.03* 1.07*  CALCIUM 8.9  --    < > 8.5* 8.7* 8.9  MG  --  1.9  --   --   --  1.6*  PROT 6.8  --   --   --   --   --   ALBUMIN 4.0  --   --   --   --   --   AST 18  --   --   --   --   --   ALT 15  --   --   --   --   --   ALKPHOS 81  --   --   --   --   --   BILITOT 0.5  --   --   --   --   --   GFRNONAA >60  --    < > 51* 52* 50*  ANIONGAP 9  --    < > '8 11 13   '$ < > = values in this interval not displayed.    Lipids No results for input(s): CHOL, TRIG, HDL, LABVLDL, LDLCALC, CHOLHDL in the last 168 hours.  Hematology Recent Labs  Lab 12/09/21 0558 12/10/21 0059 12/11/21 0051  WBC 10.5 10.4 13.3*  RBC 4.45 4.16 4.42  HGB 12.3 11.4* 12.2  HCT 36.7 34.2* 35.5*  MCV 82.5 82.2 80.3  MCH 27.6 27.4 27.6  MCHC 33.5 33.3  34.4  RDW 13.6 13.5 13.2  PLT 322 318 362   Thyroid  Recent Labs  Lab 12/08/21 1648 12/09/21 0558  TSH 7.190*  --   FREET4  --  1.27*    BNP Recent Labs  Lab 12/08/21 1158 12/11/21 1405  BNP 453.8* 733.6*    DDimer  Recent Labs  Lab 12/08/21 1158  DDIMER 0.80*     Radiology    DG Chest 1 View  Result Date: 12/12/2021 CLINICAL DATA:  Shortness of breath EXAM: CHEST  1 VIEW COMPARISON:  Yesterday FINDINGS: Cardiomegaly and vascular pedicle widening with left pleural effusion and diffuse interstitial prominence. No pneumothorax. Much of the left lower lung is obscured. Remote left rib fractures. IMPRESSION: CHF with asymmetric left pleural effusion. No convincing change from yesterday. Electronically Signed   By: Jorje Guild M.D.   On: 12/12/2021 04:28   DG CHEST PORT 1 VIEW  Result Date: 12/11/2021 CLINICAL DATA:  Shortness of breath EXAM: PORTABLE CHEST 1 VIEW COMPARISON:  12/08/2021 FINDINGS: Remote left rib fractures. Midline trachea. Moderate cardiomegaly. Layering left pleural effusion is small and similar. No pneumothorax. Interstitial prominence persists. Left greater than right base airspace disease is not significantly changed. IMPRESSION: Given differences in technique, no significant change since 12/08/2021. Congestive heart failure with layering left pleural effusion. Persistent bibasilar airspace disease, most likely atelectasis. At the left lung base, pneumonia or aspiration cannot be excluded. Electronically Signed   By: Abigail Miyamoto M.D.   On: 12/11/2021 15:41   ECHO TEE  Result Date: 12/11/2021    TRANSESOPHOGEAL ECHO REPORT   Patient Name:   [name]  [facility] Date of Exam: 12/11/2021 Medical Rec #:  [id number]         Height:       66.5 in Accession #:    [id number]        Weight:       207.7 lb Date of Birth:  Jul 18, 1931        BSA:          2.043 m Patient Age:    35 years          BP:           99/79 mmHg Patient Gender: F                 HR:           90 bpm.  Exam Location:  Inpatient Procedure: 3D Echo, Transesophageal Echo, Cardiac Doppler and Color Doppler Indications:     I48.91* Unspeicified atrial fibrillation  History:         Patient has prior history of Echocardiogram examinations, most                  recent 12/09/2021. CHF, Abnormal ECG, Arrythmias:Atrial                  Fibrillation, Signs/Symptoms:Shortness of Breath and Dyspnea;                  Risk Factors:Hypertension.  Sonographer:     Roseanna Rainbow RDCS Referring Phys:  [id number] [facility] A Gasper Sells Diagnosing Phys: [name] MD PROCEDURE: After discussion of the risks and benefits of a TEE, an informed consent was obtained from the patient. The transesophogeal probe was passed without difficulty through the esophogus of the patient. Imaged were obtained with the patient in a left lateral decubitus position. Sedation performed by different physician. The patient was monitored while under deep sedation. Anesthestetic sedation was provided intravenously by Anesthesiology: '265mg'$  of Propofol. The patient developed no complications during the procedure. A successful direct current cardioversion was performed at 200 joules with 2 attempts. IMPRESSIONS  1. Left ventricular ejection fraction, by estimation, is 45 to 50%. Left ventricular ejection fraction by 3D volume is 46 %. The left ventricle has mildly decreased function. The left ventricle demonstrates global hypokinesis. Left ventricular diastolic  function could not be evaluated.  2. Right ventricular systolic function is normal. The right ventricular size is normal.  3. Left atrial size was mildly dilated. No left atrial/left atrial appendage thrombus was detected.  4. Large pleural effusion in the left lateral region.  5. The mitral valve is grossly normal. Moderate mitral valve regurgitation. No evidence of mitral stenosis.  6. Tricuspid valve regurgitation is mild to moderate.  7. The aortic valve is tricuspid. Aortic valve regurgitation is  mild to moderate. No aortic stenosis is present. Aortic valve mean gradient measures 3.0 mmHg.  8. There is mild (Grade II) plaque involving the descending aorta. Conclusion(s)/Recommendation(s): No LAA thrombus. Successful Cardioversion. FINDINGS  Left Ventricle: Left ventricular ejection fraction, by estimation, is 45 to 50%. Left ventricular ejection fraction by 3D volume is 46 %. The left ventricle has mildly decreased function. The left ventricle demonstrates global hypokinesis. The left ventricular internal cavity size was normal in size. Left ventricular diastolic function could not be evaluated. Right Ventricle: The right ventricular size is normal. Right vetricular wall thickness was not assessed. Right ventricular systolic function is normal. Left Atrium: Left atrial size was mildly dilated. No left atrial/left atrial appendage thrombus was detected. Right Atrium:  Right atrial size was normal in size. Pericardium: Trivial pericardial effusion is present. Mitral Valve: The mitral valve is grossly normal. Moderate mitral valve regurgitation. No evidence of mitral valve stenosis. Tricuspid Valve: The tricuspid valve is normal in structure. Tricuspid valve regurgitation is mild to moderate. No evidence of tricuspid stenosis. Aortic Valve: The aortic valve is tricuspid. Aortic valve regurgitation is mild to moderate. Aortic regurgitation PHT measures 723 msec. No aortic stenosis is present. Aortic valve mean gradient measures 3.0 mmHg. Aortic valve peak gradient measures 7.0 mmHg. Aortic valve area, by VTI measures 2.53 cm. Pulmonic Valve: The pulmonic valve was normal in structure. Pulmonic valve regurgitation is not visualized. Aorta: The aortic root and ascending aorta are structurally normal, with no evidence of dilitation. There is mild (Grade II) plaque involving the descending aorta. IAS/Shunts: No atrial level shunt detected by color flow Doppler. Additional Comments: There is a large pleural effusion  in the left lateral region.  LEFT VENTRICLE PLAX 2D LVOT diam:     2.20 cm LV SV:         59 LV SV Index:   29              3D Volume EF LVOT Area:     3.80 cm        LV 3D EF:    Left                                             ventricul                                             ar                                             ejection                                             fraction                                             by 3D                                             volume is                                             46 %.                                 3D Volume EF  LV 3D EF:    46.40 %                                LV 3D EDV:   85100.00                                             mm                                LV 3D ESV:   45600.00                                             mm                                LV 3D SV:    39500.00                                             mm                                 3D Volume EF:                                3D EF:        46 % AORTIC VALVE AV Area (Vmax):    3.20 cm AV Area (Vmean):   3.35 cm AV Area (VTI):     2.53 cm AV Vmax:           132.00 cm/s AV Vmean:          87.900 cm/s AV VTI:            0.234 m AV Peak Grad:      7.0 mmHg AV Mean Grad:      3.0 mmHg LVOT Vmax:         111.00 cm/s LVOT Vmean:        77.400 cm/s LVOT VTI:          0.156 m LVOT/AV VTI ratio: 0.67 AI PHT:            723 msec MR Peak grad:    72.6 mmHg MR Mean grad:    46.0 mmHg    SHUNTS MR Vmax:         426.00 cm/s  Systemic VTI:  0.16 m MR Vmean:        314.0 cm/s   Systemic Diam: 2.20 cm MR PISA:         1.57 cm MR PISA Eff ROA: 14 mm MR PISA Radius:  0.50 cm [name] MD Electronically signed by [name] MD Signature Date/Time: 12/11/2021/5:00:37 PM    Final     Cardiac Studies   Echo 03/04/16 - Left ventricle: The cavity size was normal. Wall thickness was    normal. Systolic function was normal.  The  estimated ejection    fraction was in the range of 50% to 55%.  - Aortic valve: Calcified non coronary cusp. There was trivial    regurgitation.  - Mitral valve: There was mild regurgitation.  - Left atrium: The atrium was mildly dilated.  - Atrial septum: No defect or patent foramen ovale was identified.   Patient Profile     88 y.o. female with hypertension, hyperlipidemia, and newly diagnosed atrial fibrillation.  Assessment & Plan    # Paroxysmal atrial fibrillation: Newly noted this admission.  She reports 3 weeks of exertional dyspnea and edema.  I suspect this is the timeline of her new onset atrial fibrillation. She is maintaining sinus rhythm after DCCV. Continue Eliquis and metoprolol.   # Acute systolic diastolic heart failure: BNP elevated on admission and yesterday.  No significant diuresis (-700 mL).   UOP not accurate due to loss of urine in the bed and no intake recorded. BNP elevated yesterday and CXR with pulmonary edema.  Sodium is decreasing with attempts at diuresis and increasing lasix dose.  Creatinine also increasing.  Recommend nephrology consult.  Echo revealed LVEF 45%.  I suspect this is all in the setting of new onset atrial fibrillation.  She has not had any ischemic symptoms.  Given lack of her ischemic symptoms and her age, we will plan to repeat echo in 3 months.  At that point if her LVEF remains reduced would consider an ischemic evaluation.    # Hyponatremia: Hyponatremia is worsening with diuresis.  Fluoxetine was reduced.  Nephrology consult as above.       For questions or updates, please contact [place] Please consult [email or web address] for contact info under        Signed, [name], MD  12/12/2021, 8:26 AM

## 2021-12-12 NOTE — Plan of Care (Signed)

## 2021-12-12 NOTE — Discharge Instructions (Addendum)
Information on my medicine - ELIQUIS (apixaban)  This medication education was reviewed with me or my healthcare representative as part of my discharge preparation.     Why was Eliquis prescribed for you? Eliquis was prescribed for you to reduce the risk of a blood clot forming that can cause a stroke if you have a medical condition called atrial fibrillation (a type of irregular heartbeat).  What do You need to know about Eliquis ? Take your Eliquis TWICE DAILY - one tablet in the morning and one tablet in the evening with or without food. If you have difficulty swallowing the tablet whole please discuss with your pharmacist how to take the medication safely.  Take Eliquis exactly as prescribed by your doctor and DO NOT stop taking Eliquis without talking to the doctor who prescribed the medication.  Stopping may increase your risk of developing a stroke.  Refill your prescription before you run out.  After discharge, you should have regular check-up appointments with your healthcare provider that is prescribing your Eliquis.  In the future your dose may need to be changed if your kidney function or weight changes by a significant amount or as you get older.  What do you do if you miss a dose? If you miss a dose, take it as soon as you remember on the same day and resume taking twice daily.  Do not take more than one dose of ELIQUIS at the same time to make up a missed dose.  Important Safety Information A possible side effect of Eliquis is bleeding. You should call your healthcare provider right away if you experience any of the following: Bleeding from an injury or your nose that does not stop. Unusual colored urine (red or dark brown) or unusual colored stools (red or black). Unusual bruising for unknown reasons. A serious fall or if you hit your head (even if there is no bleeding).  Some medicines may interact with Eliquis and might increase your risk of bleeding or clotting  while on Eliquis. To help avoid this, consult your healthcare provider or pharmacist prior to using any new prescription or non-prescription medications, including herbals, vitamins, non-steroidal anti-inflammatory drugs (NSAIDs) and supplements.  This website has more information on Eliquis (apixaban): http://www.eliquis.com/eliquis/home =======================================  Atrial Fibrillation    Atrial fibrillation is a type of heartbeat that is irregular or fast. If you have this condition, your heart beats without any order. This makes it hard for your heart to pump blood in a normal way. Atrial fibrillation may come and go, or it may become a long-lasting problem. If this condition is not treated, it can put you at higher risk for stroke, heart failure, and other heart problems.  What are the causes? This condition may be caused by diseases that damage the heart. They include: High blood pressure. Heart failure. Heart valve disease. Heart surgery. Other causes include: Diabetes. Thyroid disease. Being overweight. Kidney disease. Sometimes the cause is not known.  What increases the risk? You are more likely to develop this condition if: You are older. You smoke. You exercise often and very hard. You have a family history of this condition. You are a man. You use drugs. You drink a lot of alcohol. You have lung conditions, such as emphysema, pneumonia, or COPD. You have sleep apnea.  What are the signs or symptoms? Common symptoms of this condition include: A feeling that your heart is beating very fast. Chest pain or discomfort. Feeling short of breath. Suddenly feeling   light-headed or weak. Getting tired easily during activity. Fainting. Sweating. In some cases, there are no symptoms.  How is this treated? Treatment for this condition depends on underlying conditions and how you feel when you have atrial fibrillation. They include: Medicines to: Prevent  blood clots. Treat heart rate or heart rhythm problems. Using devices, such as a pacemaker, to correct heart rhythm problems. Doing surgery to remove the part of the heart that sends bad signals. Closing an area where clots can form in the heart (left atrial appendage). In some cases, your doctor will treat other underlying conditions.  Follow these instructions at home:  Medicines Take over-the-counter and prescription medicines only as told by your doctor. Do not take any new medicines without first talking to your doctor. If you are taking blood thinners: Talk with your doctor before you take any medicines that have aspirin or NSAIDs, such as ibuprofen, in them. Take your medicine exactly as told by your doctor. Take it at the same time each day. Avoid activities that could hurt or bruise you. Follow instructions about how to prevent falls. Wear a bracelet that says you are taking blood thinners. Or, carry a card that lists what medicines you take. Lifestyle         Do not use any products that have nicotine or tobacco in them. These include cigarettes, e-cigarettes, and chewing tobacco. If you need help quitting, ask your doctor. Eat heart-healthy foods. Talk with your doctor about the right eating plan for you. Exercise regularly as told by your doctor. Do not drink alcohol. Lose weight if you are overweight. Do not use drugs, including cannabis.  General instructions If you have a condition that causes breathing to stop for a short period of time (apnea), treat it as told by your doctor. Keep a healthy weight. Do not use diet pills unless your doctor says they are safe for you. Diet pills may make heart problems worse. Keep all follow-up visits as told by your doctor. This is important.  Contact a doctor if: You notice a change in the speed, rhythm, or strength of your heartbeat. You are taking a blood-thinning medicine and you get more bruising. You get tired more easily  when you move or exercise. You have a sudden change in weight.  Get help right away if:    You have pain in your chest or your belly (abdomen). You have trouble breathing. You have side effects of blood thinners, such as blood in your vomit, poop (stool), or pee (urine), or bleeding that cannot stop. You have any signs of a stroke. "BE FAST" is an easy way to remember the main warning signs: B - Balance. Signs are dizziness, sudden trouble walking, or loss of balance. E - Eyes. Signs are trouble seeing or a change in how you see. F - Face. Signs are sudden weakness or loss of feeling in the face, or the face or eyelid drooping on one side. A - Arms. Signs are weakness or loss of feeling in an arm. This happens suddenly and usually on one side of the body. S - Speech. Signs are sudden trouble speaking, slurred speech, or trouble understanding what people say. T - Time. Time to call emergency services. Write down what time symptoms started. You have other signs of a stroke, such as: A sudden, very bad headache with no known cause. Feeling like you may vomit (nausea). Vomiting. A seizure.  These symptoms may be an emergency. Do not wait to   see if the symptoms will go away. Get medical help right away. Call your local emergency services (911 in the [place] Do not drive yourself to the hospital. Summary Atrial fibrillation is a type of heartbeat that is irregular or fast. You are at higher risk of this condition if you smoke, are older, have diabetes, or are overweight. Follow your doctor's instructions about medicines, diet, exercise, and follow-up visits. Get help right away if you have signs or symptoms of a stroke. Get help right away if you cannot catch your breath, or you have chest pain or discomfort. This information is not intended to replace advice given to you by your health care provider. Make sure you discuss any questions you have with your health care provider. Document  Revised: 03/16/2019 Document Reviewed: 03/16/2019 [employer] Patient Education  2020 [employer]    

## 2021-12-12 NOTE — Evaluation (Signed)
Occupational Therapy Evaluation ?Patient Details ?Name: [name] ?MRN: [id number] ?DOB: 08-Mar-1931 ?Today's Date: 12/12/2021 ? ? ?History of Present Illness Patient is a 88 y/o female who presents on 12/08/21 with SOB. Found to have new onset A-fib and acute respiratory distress secondary to CHF exacerbation and SIRS. CXR-bilateral interstitial thickening concerning for edema versus pneumonia in the left lower lobe. S/p cardioversion 3/8. PMH includes HTN and RBBB., mild AR/MR.  ? ?Clinical Impression ?  ?Pt admitted with the above diagnoses and presents with below problem list. Pt will benefit from continued acute OT to address the below listed deficits and maximize independence with basic ADLs prior to d/c home. At baseline, pt is independent with ADLs. She lives with her spouse who is currently receiving [place] OT/PT services per her report. Daughter lives in [place]. Pt currently needs up to min A with functional transfers from regular height surfaces and LB ADLs. Utilized rw this session for balance. Pt on 3L O2 throughout session with sats 90-93 at rest, 87-89 with OOB activity. Up in recliner at end of session.  ?  ?   ? ?Recommendations for follow up therapy are one component of a multi-disciplinary discharge planning process, led by the attending physician.  Recommendations may be updated based on patient status, additional functional criteria and insurance authorization.  ? ?Follow Up Recommendations ? Home health OT  ?  ?Assistance Recommended at Discharge Intermittent Supervision/Assistance  ?Patient can return home with the following A little help with walking and/or transfers;A little help with bathing/dressing/bathroom;Assistance with cooking/housework ? ?  ?Functional Status Assessment ? Patient has had a recent decline in their functional status and demonstrates the ability to make significant improvements in function in a reasonable and predictable amount of time.  ?Equipment Recommendations ?  None recommended by OT  ?  ?Recommendations for Other Services   ? ? ?  ?Precautions / Restrictions Precautions ?Precautions: Fall;Other (comment) ?Precaution Comments: watch HR/02 ?Restrictions ?Weight Bearing Restrictions: No  ? ?  ? ?Mobility Bed Mobility ?Overal bed mobility: Needs Assistance ?Bed Mobility: Supine to Sit ?  ?  ?Supine to sit: Supervision ?  ?  ?  ?  ? ?Transfers ?Overall transfer level: Needs assistance ?Equipment used: Rolling walker (2 wheels) ?Transfers: Sit to/from Stand ?Sit to Stand: Min assist, Min guard ?  ?  ?  ?  ?  ?General transfer comment: min A to power up from EOB at regular height. needed BUE support this session. success on second attempt standing. min guard to sit into recliner ?  ? ?  ?Balance Overall balance assessment: Needs assistance ?Sitting-balance support: Feet supported, No upper extremity supported ?Sitting balance-Leahy Scale: Good ?  ?  ?Standing balance support: During functional activity, Bilateral upper extremity supported ?Standing balance-Leahy Scale: Poor ?  ?  ?  ?  ?  ?  ?  ?  ?  ?  ?  ?  ?   ? ?ADL either performed or assessed with clinical judgement  ? ?ADL Overall ADL's : Needs assistance/impaired ?Eating/Feeding: Set up;Sitting ?  ?Grooming: Set up;Sitting ?  ?Upper Body Bathing: Set up;Sitting ?  ?Lower Body Bathing: Minimal assistance;Sit to/from stand ?  ?Upper Body Dressing : Set up;Sitting ?  ?Lower Body Dressing: Minimal assistance;Sit to/from stand ?  ?Toilet Transfer: Minimal assistance;Min guard;Ambulation;Rolling walker (2 wheels);Regular Toilet;Comfort height toilet ?  ?Toileting- Clothing Manipulation and Hygiene: Minimal assistance;Min guard;Sit to/from stand ?  ?Tub/ Shower Transfer: Min guard;Minimal assistance ?  ?Functional mobility during ADLs:  Min guard;Rolling walker (2 wheels) ?General ADL Comments: decreased activity tolerance and generalized weakness,  ? ? ? ?Vision   ?   ?   ?Perception   ?  ?Praxis   ?  ? ?Pertinent Vitals/Pain  Pain Assessment ?Pain Assessment: No/denies pain  ? ? ? ?Hand Dominance Right ?  ?Extremity/Trunk Assessment Upper Extremity Assessment ?Upper Extremity Assessment: Generalized weakness;Overall [facility] for tasks assessed ?  ?Lower Extremity Assessment ?Lower Extremity Assessment: Defer to PT evaluation ?  ?  ?  ?Communication Communication ?Communication: No difficulties;HOH (pt endorses) ?  ?Cognition Arousal/Alertness: Awake/alert ?Behavior During Therapy: [facility] for tasks assessed/performed ?Overall Cognitive Status: Within Functional Limits for tasks assessed ?  ?  ?  ?  ?  ?  ?  ?  ?  ?  ?  ?  ?  ?  ?  ?  ?  ?  ?  ?General Comments  Pt on 3L O2 throughout session with sats 90-93 at rest, 87-89 with OOB activity. ? ?  ?Exercises   ?  ?Shoulder Instructions    ? ? ?Home Living Family/patient expects to be discharged to:: Private residence ?Living Arrangements: Spouse/significant other ?Available Help at Discharge: Family;Available 24 hours/day ?Type of Home: House ?Home Access: Stairs to enter ?Entrance Stairs-Number of Steps: 1 ?  ?Home Layout: One level ?  ?  ?Bathroom Shower/Tub: Walk-in shower ?  ?  ?  ?  ?Home Equipment: BSC/3in1;Shower seat - built in;Rollator (4 wheels);Rolling Walker (2 wheels) ?  ?Additional Comments: spouse currently has HH PT/OT. daughter lives in [place]. ?  ? ?  ?Prior Functioning/Environment Prior Level of Function : Independent/Modified Independent ?  ?  ?  ?  ?  ?  ?Mobility Comments: Independent, does IADLs, has a cleaning lady once/month. has been helping care for spouse ?ADLs Comments: independent ?  ? ?  ?  ?OT Problem List:   ?  ?   ?OT Treatment/Interventions:    ?  ?OT Goals(Current goals can be found in the care plan section) Acute Rehab OT Goals ?Patient Stated Goal: home, independence with ADLs ?OT Goal Formulation: With patient ?Time For Goal Achievement: 12/26/21 ?Potential to Achieve Goals: Good ?ADL Goals ?Pt Will Perform Grooming: with modified independence;standing ?Pt  Will Perform Lower Body Bathing: with modified independence;sit to/from stand ?Pt Will Perform Lower Body Dressing: with modified independence;sit to/from stand ?Pt Will Perform Tub/Shower Transfer: with modified independence;Shower transfer;ambulating  ?OT Frequency:   ?  ? ?Co-evaluation   ?  ?  ?  ?  ? ?  ?AM-PAC OT "6 Clicks" Daily Activity     ?Outcome Measure   ?  ?  ?  ?  ?  ?  ?  ?End of Session Equipment Utilized During Treatment: Rolling walker (2 wheels);Oxygen (3 L/min) ? ?Activity Tolerance: Patient limited by fatigue;Patient tolerated treatment well ?Patient left: in chair;with call bell/phone within reach;with chair alarm set ? ?   ?              ?Time: 1610-9604 ?OT Time Calculation (min): 32 min ?Charges:  OT General Charges ?$OT Visit: 1 Visit ?OT Evaluation ?$OT Eval Low Complexity: 1 Low ?OT Treatments ?$Self Care/Home Management : 8-22 mins ? ?Tyrone Schimke, OT ?Acute Rehabilitation Services ?Office: [phone] ? ? ?Tyrone Schimke H ?12/12/2021, 10:46 AM ?

## 2021-12-12 NOTE — Plan of Care (Signed)

## 2021-12-13 ENCOUNTER — Other Ambulatory Visit (HOSPITAL_COMMUNITY): Payer: Self-pay

## 2021-12-13 DIAGNOSIS — I10 Essential (primary) hypertension: Secondary | ICD-10-CM | POA: Diagnosis not present

## 2021-12-13 DIAGNOSIS — I5041 Acute combined systolic (congestive) and diastolic (congestive) heart failure: Secondary | ICD-10-CM | POA: Diagnosis not present

## 2021-12-13 DIAGNOSIS — I4891 Unspecified atrial fibrillation: Secondary | ICD-10-CM | POA: Diagnosis not present

## 2021-12-13 DIAGNOSIS — E871 Hypo-osmolality and hyponatremia: Secondary | ICD-10-CM | POA: Diagnosis not present

## 2021-12-13 DIAGNOSIS — D649 Anemia, unspecified: Secondary | ICD-10-CM | POA: Diagnosis not present

## 2021-12-13 LAB — COMPREHENSIVE METABOLIC PANEL
ALT: 20 U/L (ref 0–44)
AST: 22 U/L (ref 15–41)
Albumin: 3.1 g/dL — ABNORMAL LOW (ref 3.5–5.0)
Alkaline Phosphatase: 80 U/L (ref 38–126)
Anion gap: 10 (ref 5–15)
BUN: 17 mg/dL (ref 8–23)
CO2: 25 mmol/L (ref 22–32)
Calcium: 8 mg/dL — ABNORMAL LOW (ref 8.9–10.3)
Chloride: 80 mmol/L — ABNORMAL LOW (ref 98–111)
Creatinine, Ser: 0.88 mg/dL (ref 0.44–1.00)
GFR, Estimated: >60 mL/min (ref >60)
Glucose, Bld: 116 mg/dL — ABNORMAL HIGH (ref 70–99)
Potassium: 3.3 mmol/L — ABNORMAL LOW (ref 3.5–5.1)
Sodium: 115 mmol/L — CL (ref 135–145)
Total Bilirubin: 0.6 mg/dL (ref 0.3–1.2)
Total Protein: 6.1 g/dL — ABNORMAL LOW (ref 6.5–8.1)

## 2021-12-13 LAB — RENAL FUNCTION PANEL
Albumin: 3.1 g/dL — ABNORMAL LOW (ref 3.5–5.0)
Anion gap: 10 (ref 5–15)
BUN: 17 mg/dL (ref 8–23)
CO2: 22 mmol/L (ref 22–32)
Calcium: 8.1 mg/dL — ABNORMAL LOW (ref 8.9–10.3)
Chloride: 81 mmol/L — ABNORMAL LOW (ref 98–111)
Creatinine, Ser: 0.87 mg/dL (ref 0.44–1.00)
GFR, Estimated: >60 mL/min (ref >60)
Glucose, Bld: 113 mg/dL — ABNORMAL HIGH (ref 70–99)
Phosphorus: 3.4 mg/dL (ref 2.5–4.6)
Potassium: 3.8 mmol/L (ref 3.5–5.1)
Sodium: 113 mmol/L — CL (ref 135–145)

## 2021-12-13 LAB — CULTURE, BLOOD (ROUTINE X 2)
Culture: NO GROWTH
Culture: NO GROWTH
Special Requests: ADEQUATE
Special Requests: ADEQUATE

## 2021-12-13 LAB — BASIC METABOLIC PANEL
Anion gap: 11 (ref 5–15)
BUN: 16 mg/dL (ref 8–23)
CO2: 22 mmol/L (ref 22–32)
Calcium: 8.6 mg/dL — ABNORMAL LOW (ref 8.9–10.3)
Chloride: 83 mmol/L — ABNORMAL LOW (ref 98–111)
Creatinine, Ser: 0.94 mg/dL (ref 0.44–1.00)
GFR, Estimated: 58 mL/min — ABNORMAL LOW (ref >60)
Glucose, Bld: 108 mg/dL — ABNORMAL HIGH (ref 70–99)
Potassium: 3.9 mmol/L (ref 3.5–5.1)
Sodium: 116 mmol/L — CL (ref 135–145)

## 2021-12-13 LAB — CORTISOL: Cortisol, Plasma: 18.5 ug/dL

## 2021-12-13 LAB — PHOSPHORUS: Phosphorus: 3.5 mg/dL (ref 2.5–4.6)

## 2021-12-13 MED ORDER — SODIUM CHLORIDE 0.9 % IV BOLUS
1000.0000 mL | Freq: Once | INTRAVENOUS | Status: AC
  Administered 2021-12-13: 1000 mL via INTRAVENOUS

## 2021-12-13 MED ORDER — FUROSEMIDE 10 MG/ML IJ SOLN
160.0000 mg | Freq: Once | INTRAVENOUS | Status: AC
  Administered 2021-12-13: 160 mg via INTRAVENOUS
  Filled 2021-12-13: qty 16

## 2021-12-13 MED ORDER — POTASSIUM CHLORIDE 10 MEQ/100ML IV SOLN
10.0000 meq | INTRAVENOUS | Status: AC
  Administered 2021-12-13 (×3): 10 meq via INTRAVENOUS
  Filled 2021-12-13 (×3): qty 100

## 2021-12-13 MED ORDER — FUROSEMIDE 10 MG/ML IJ SOLN
160.0000 mg | Freq: Four times a day (QID) | INTRAVENOUS | Status: DC
  Administered 2021-12-14 – 2021-12-16 (×10): 160 mg via INTRAVENOUS
  Filled 2021-12-13 (×9): qty 16
  Filled 2021-12-13: qty 14
  Filled 2021-12-13 (×2): qty 16

## 2021-12-13 MED ORDER — MOMETASONE FURO-FORMOTEROL FUM 200-5 MCG/ACT IN AERO
2.0000 | INHALATION_SPRAY | Freq: Two times a day (BID) | RESPIRATORY_TRACT | Status: DC
  Administered 2021-12-13 – 2021-12-22 (×18): 2 via RESPIRATORY_TRACT
  Filled 2021-12-13: qty 8.8

## 2021-12-13 MED ORDER — POTASSIUM CHLORIDE CRYS ER 20 MEQ PO TBCR: 40.0000 meq | EXTENDED_RELEASE_TABLET | Freq: Once | ORAL | Status: DC

## 2021-12-13 MED ORDER — UREA 15 G PO PACK
15.0000 g | PACK | Freq: Two times a day (BID) | ORAL | Status: DC
  Administered 2021-12-13 (×2): 15 g via ORAL
  Filled 2021-12-13 (×3): qty 1

## 2021-12-13 MED ORDER — SACUBITRIL-VALSARTAN 24-26 MG PO TABS
1.0000 | ORAL_TABLET | Freq: Two times a day (BID) | ORAL | Status: DC
  Administered 2021-12-13 – 2021-12-22 (×19): 1 via ORAL
  Filled 2021-12-13 (×19): qty 1

## 2021-12-13 NOTE — Progress Notes (Signed)
Physical Therapy Treatment ?Patient Details ?Name: [name] ?MRN: [id number] ?DOB: [birth year] ?Today's Date: 12/13/2021 ? ? ?History of Present Illness Patient is a 88 y/o female who presents on 12/08/21 with SOB. Found to have new onset A-fib and acute respiratory distress secondary to CHF exacerbation and SIRS. CXR-bilateral interstitial thickening concerning for edema versus pneumonia in the left lower lobe. S/p cardioversion 3/8. PMH includes HTN and RBBB., mild AR/MR. ? ?  ?PT Comments  ? ? Pt received in supine, agreeable to therapy session and with good participation as able. Pt with noted hyponatremia per chart review and c/o dizziness with prolonged standing and had multiple episodes of urinary incontinence after taking lasix earlier in the day. Emphasis on reciprocal transfer training, supine and standing pre-gait exercises. Defer longer gait trial due to pt fatigue/lightheadedness in standing. Pt continues to benefit from PT services to progress toward functional mobility goals. Plan to assess longer gait trial next session, pt may benefit from rollator for seated breaks if energy level remains limited.  ?Recommendations for follow up therapy are one component of a multi-disciplinary discharge planning process, led by the attending physician.  Recommendations may be updated based on patient status, additional functional criteria and insurance authorization. ? ?Follow Up Recommendations ? Home health PT ?  ?  ?Assistance Recommended at Discharge Intermittent Supervision/Assistance  ?Patient can return home with the following Help with stairs or ramp for entrance;A little help with walking and/or transfers;A little help with bathing/dressing/bathroom;Assistance with cooking/housework ?  ?Equipment Recommendations ? None recommended by PT  ?  ?Recommendations for Other Services   ? ? ?  ?Precautions / Restrictions Precautions ?Precautions: Fall;Other (comment) ?Precaution Comments: watch HR/02; urinary  incontinence (lasix); hyponatremia ?Restrictions ?Weight Bearing Restrictions: No  ?  ? ?Mobility ? Bed Mobility ?Overal bed mobility: Needs Assistance ?Bed Mobility: Rolling, Sidelying to Sit, Sit to Sidelying ?Rolling: Min guard ?Sidelying to sit: Min assist, HOB elevated ?  ?Sit to supine: Min guard ?  ?General bed mobility comments: to R EOB, multimodal cues and pt utilizing bed rail to assist; cues to prop up on R elbow; HOB ~20 deg ?  ? ?Transfers ?Overall transfer level: Needs assistance ?Equipment used: Rolling walker (2 wheels) ?Transfers: Sit to/from Stand ?Sit to Stand: Min assist, From elevated surface ?  ?  ?  ?  ?  ?General transfer comment: pt needs cues each attempt for hand placement and benefits from momentum strategy to rise; to RW x 5 reps ?  ? ?Ambulation/Gait ?Ambulation/Gait assistance: Min assist ?  ?Assistive device: Rolling walker (2 wheels) ?  ?  ?  ?Pre-gait activities: standing hip flexion x10 reps; sidesteps toward [employer]; limited due to pt dizziness (hyponatremia) and frequent incontinence upon standing (lasix) ?  ? ? ?Stairs ?  ?  ?  ?  ?  ? ? ?Wheelchair Mobility ?  ? ?Modified Rankin (Stroke Patients Only) ?  ? ? ?  ?Balance Overall balance assessment: Needs assistance ?Sitting-balance support: Feet supported, No upper extremity supported ?Sitting balance-Leahy Scale: Good ?  ?Postural control: Posterior lean ?Standing balance support: During functional activity, Bilateral upper extremity supported ?Standing balance-Leahy Scale: Poor ?Standing balance comment: posterior LOB with marches, needs minA to correct with RW support ?  ?  ?  ?  ?  ?  ?  ?  ?  ?  ?  ?  ? ?  ?Cognition Arousal/Alertness: Awake/alert ?Behavior During Therapy: [facility] for tasks assessed/performed ?Overall Cognitive Status: Within Functional Limits for tasks  assessed ?  ?  ?  ?  ?  ?  ?  ?  ?  ?  ?  ?  ?  ?  ?  ?  ?General Comments: pt somewhat slow processing and mild STM deficit; notably, hyponatremia may have  contributed to lethargy ?  ?  ? ?  ?Exercises General Exercises - Lower Extremity ?Ankle Circles/Pumps: Both, 15 reps, Supine ?Long Arc Quad: Both, 10 reps, Seated ?Hip Flexion/Marching: AROM, Both, 10 reps, Standing ?Other Exercises ?Other Exercises: STS x 5 reps ? ?  ?General Comments General comments (skin integrity, edema, etc.): BP 141/83 supine; BP 132/63 (85) seated EOB; SpO2 86-94% on 2L O2 [place] ?  ?  ? ?Pertinent Vitals/Pain Pain Assessment ?Pain Assessment: No/denies pain  ? ? ? ?PT Goals (current goals can now be found in the care plan section) Acute Rehab PT Goals ?PT Goal Formulation: With patient ?Time For Goal Achievement: 12/23/21 ?Progress towards PT goals: Progressing toward goals ? ?  ?Frequency ? ? ? Min 3X/week ? ? ? ?  ?PT Plan Current plan remains appropriate  ? ? ?   ?AM-PAC PT "6 Clicks" Mobility   ?Outcome Measure ? Help needed turning from your back to your side while in a flat bed without using bedrails?: A Little ?Help needed moving from lying on your back to sitting on the side of a flat bed without using bedrails?: A Lot ?Help needed moving to and from a bed to a chair (including a wheelchair)?: A Little ?Help needed standing up from a chair using your arms (e.g., wheelchair or bedside chair)?: A Little ?Help needed to walk in hospital room?: A Lot ?Help needed climbing 3-5 steps with a railing? : Total ?6 Click Score: 14 ? ?  ?End of Session Equipment Utilized During Treatment: Oxygen;Gait belt ?Activity Tolerance: Patient tolerated treatment well ?Patient left: in bed;with call bell/phone within reach;with family/visitor present ?Nurse Communication: Mobility status ?PT Visit Diagnosis: Muscle weakness (generalized) (M62.81);Difficulty in walking, not elsewhere classified (R26.2) ?  ? ? ?Time: 2947-6546 ?PT Time Calculation (min) (ACUTE ONLY): 23 min ? ?Charges:  $Therapeutic Exercise: 8-22 mins ?$Therapeutic Activity: 8-22 mins          ?          ? ?Eann Cleland P., PTA ?Acute  Rehabilitation Services ?Pager: [phone] ?Office: [phone]  ? ? ?Kara Pacer Torsha Lemus ?12/13/2021, 5:22 PM ? ?

## 2021-12-13 NOTE — Progress Notes (Addendum)
Date and time results received: 12/13/21 1858 ? ?Test: Sodium ?Critical Value: 115 ? ?Name of Provider Notified: Justin Mend MD via secure chat. Immediate response. See new orders.  ?

## 2021-12-13 NOTE — Progress Notes (Signed)
MD notified of critical lab value Na 113. ?

## 2021-12-13 NOTE — Progress Notes (Signed)
?Progress Note ? ? ?Patient: [name] XNA:[id number] DOB: Jun 16, 1931 DOA: 12/08/2021     5 ?DOS: the patient was seen and examined on 12/13/2021 ?  ?Brief hospital course: ?[name] was admitted to the hospital with the working diagnosis of acute on chronic heart failure decompensation.  ? ?88 yo female with the past medical history of hypertension, dyslipidemia, and hypothyroid who presented with dyspnea. She reported dyspnea on exertion that became acutely worse at 4 am, prompting her to come to the hospital. She also reported recent worsening of cough and feeling palpitations. On her initial physical examination her blood pressure was 115/77, HR 109, RR 22 and 02 saturation 93%, heart with S1 and S2 present, irregularly irregular, with no gallops or rubs, lungs with intermittent rales and positive wheezing, abdomen soft and positive lower extremity edema.  ? ?Na 125, K 4,1 CL 93, bicarbonate 23, glucose 102, bun 13 cr 0,70 ?BNP 453 ?Wbc 8,7. Hgb 11,9 hct 36,5 Plt 323  ?D dimer 0,80 ?Sars covid 19 negative  ? ?Chest radiograph with increased lung markings bilaterally, small bilateral pleural effusions. ? ?EKG 115 bpm, normal axis, qtc 525, right bundle branch block, atrial fibrillation rhythm, no significant ST segment or T wave changes.   ? ?Patient was placed on furosemide for diuresis.  ? ?Cardiology was consulted with recommendations for cardioversion.  ?03/08 direct current cardioversion with conversion to sinus rhythm.  ? ?Patient with worsening hyponatremia, nephrology consulted.  ? ?Assessment and Plan: ?Acute CHF (congestive heart failure) ([place]) ?Echocardiogram with LV EF 45% with global hypokinesis, mild reduction of RV systolic function, RSVP 54,2. Moderate mitral regurgitation. Mild dilatation of ascending aorta.  ? ?Urine output is 1700 cc over last 24 hrs.  ?Negative fluid balance since admission 4,984.  ?Systolic blood pressure 706 to 150 mmHg.  ? ?Clinically her volume status has  improved. ?Continue blood pressure monitoring. ?Continue with entresto and metoprolol.  ? ? ?Atrial fibrillation with RVR ([place]) ?Patient now sp cardioversion recovered sinus rhythm. ?Rate control with metoprolol and anticoagulation with apixaban.  ? ?  ? ?Essential hypertension ?Stable blood pressure continue with entresto and metoprolol.  ?Continue close blood pressure monitoring.  ? ?Hyponatremia ?Hypokalemia/ hypomagnesemia.  ? ?Today her volume status clinically looks better.  ? ?Renal function with serum Na 116, K 3,9 and serum bicarbonate at 22, cr is 0,94.  ? ?Will check with nephrology further diuretic regimen.  ?Continue oral Kcl to prevent hypokalemia.  ? ?Patient has intermittent wheezing, doubt is fluids related after achieving negative fluid balance, will add inhaled corticosteroid with dulera and continue oxymetry monitoring.   ?Follow up renal function and electrolytes in am.  ? ?Normocytic anemia ?Follow up cell count in am.  ?hgb and hct have been stable.  ? ?Hypothyroidism ?Patient with high TSH along with low free T4 and free T3. ?Continue with levothyroxine and follow up thyroid function test in 3 weeks as outpatient.  ? ? ?Class 1 obesity ?Calculated BMI is 33,0 ?Plan to encourage mobility, out of bed to chair tid with meals. ?Pt and Ot.  ? ?SIRS (systemic inflammatory response syndrome) (HCC)-resolved as of 12/11/2021 ?- Clinically do not suspect pneumonia, chest x-ray findings are suggestive of CHF and atelectasis ?-Afebrile, no leukocytosis, lactate and procalcitonin are normal ?-discontinued antibiotics yesterday ? ? ? ? ?  ? ?Subjective: Patient is feeling better, dyspnea and edema have improved, she has intermittent anxiety. At home take lorazepam at night.  ? ?Physical Exam: ?Vitals:  ? 12/13/21 0341  12/13/21 8099 12/13/21 0646 12/13/21 0800  ?BP: (!) 153/81   (!) 144/76  ?Pulse: 61  63 64  ?Resp: 15  (!) 21 16  ?Temp: 97.7 ?F (36.5 ?C)   97.9 ?F (36.6 ?C)  ?TempSrc: Oral   Oral  ?SpO2: 94%   96% 90%  ?Weight:  94.3 kg    ?Height:      ? ?Neurology awake and alert ?ENT with no pallor ?Cardiovascular with S1 and S2 present and rhythmic with no gallops or murmurs ?No JVD ?Trace lower extremity edema ?Respiratory with prolonged expiratory phase with no wheezing, scattered rales at bases. ?Abdomen not distended  ?Data Reviewed: ? ? ? ?Family Communication: I was not able to reach her daughter, left a message.  ? ?Disposition: ?Status is: Inpatient ?Remains inpatient appropriate because: heart failure  ? Planned Discharge Destination: Home ? ? ? ?Author: ?Tawni Millers, MD ?12/13/2021 8:53 AM ? ?For on call review [email or web address].  ?

## 2021-12-13 NOTE — Progress Notes (Signed)
[facility] Renal Progress Note  Requesting MD:  Mauricio Arrien Indication for Consultation: Hyponatremia   HPI:  [name] is a 88 y.o. female with phmx of HTN, HLD, hypothroid who presented to the ED on 3/5 for dyspnea and admitted for acute on chronic HF and afib w/ RVR (cardioverted 3/8). TEE EF of 45%. Nephrology was consulted d/t the patient's ongoing hyponatremia. Sodium of 125 at admission; continuing to downtrend.    Hypernatremia worsened overnight at 116. Given 0.9% NaCl bolus. Patient complaining of headache and nausea this am. Nurse gave APAP. Potentially d/t hyponatremia, neuro exam, BP at baseline. Will repeat chemistries after NaCl bolus. Need to fluid restrict.   Cortisol WNL. Urine Sodium 51. Urine osm 298. Sodium 116 Potassium 3.9 chloride 83 CO2 22 BUN 16 creatinine 0.94 glucose 108 calcium 8.6 Hgb 12.2   Creatinine, Ser  Date/Time Value Ref Range Status  12/13/2021 12:58 AM 0.94 0.44 - 1.00 mg/dL Final  12/12/2021 05:18 AM 1.07 (H) 0.44 - 1.00 mg/dL Final  12/12/2021 12:46 AM 1.03 (H) 0.44 - 1.00 mg/dL Final  12/11/2021 12:51 AM 1.05 (H) 0.44 - 1.00 mg/dL Final  12/10/2021 12:59 AM 0.87 0.44 - 1.00 mg/dL Final  12/09/2021 05:58 AM 0.86 0.44 - 1.00 mg/dL Final  12/08/2021 11:58 AM 0.70 0.44 - 1.00 mg/dL Final     PMHx:   Past Medical History:  Diagnosis Date   Dyspnea    diastolic dysfunction by echo 2012   GERD (gastroesophageal reflux disease)    Hypertension    Mild mitral and aortic regurgitation    Osteoarthritis    Right bundle branch block 2014   Thyroid disease     Past Surgical History:  Procedure Laterality Date   arm surgery     following an accident   CARDIOVERSION N/A 12/11/2021   Procedure: CARDIOVERSION;  Surgeon: Werner Lean, MD;  Location: [facility];  Service: Cardiovascular;  Laterality: N/A;   KNEE SURGERY     Following an accident   TEE WITHOUT CARDIOVERSION N/A 12/11/2021   Procedure:  TRANSESOPHAGEAL ECHOCARDIOGRAM (TEE);  Surgeon: Werner Lean, MD;  Location: [facility] ENDOSCOPY;  Service: Cardiovascular;  Laterality: N/A;   TONSILLECTOMY AND ADENOIDECTOMY     VAGINAL HYSTERECTOMY      Family Hx:  Family History  Problem Relation Age of Onset   Heart attack Mother     Social History:  reports that she has never smoked. She has never used smokeless tobacco. She reports that she does not drink alcohol and does not use drugs.  Allergies: No Known Allergies  Medications: Prior to Admission medications   Medication Sig Start Date End Date Taking? Authorizing Provider  amLODipine (NORVASC) 2.5 MG tablet Take 2.5 mg by mouth daily.   Yes [provider]  aspirin 81 MG tablet Take 81 mg by mouth daily.   Yes [provider]  Calcium Carbonate-Vitamin D 500-125 MG-UNIT TABS Take 1 tablet by mouth every evening.   Yes [provider]  Cholecalciferol (VITAMIN D) 50 MCG (2000 UT) CAPS Take 2,000 Units by mouth daily. 03/26/21  Yes [provider]  FLUoxetine (PROZAC) 40 MG capsule Take 40 mg by mouth every morning.   Yes [provider]  fluticasone (FLONASE) 50 MCG/ACT nasal spray Place 1 spray into both nostrils daily as needed for allergies or rhinitis.   Yes [provider]  levothyroxine (SYNTHROID, LEVOTHROID) 150 MCG tablet Take 150-225 mcg by mouth See admin instructions. Takes 150 mg on  Thursday, Friday and Saturday and 225 mg on all other days.   Yes [provider]  LORazepam (ATIVAN) 0.5 MG tablet Take 0.5 mg by mouth daily as needed for anxiety. 03/18/21  Yes [provider]  lovastatin (MEVACOR) 40 MG tablet Take 60 mg by mouth at bedtime.   Yes [provider]  Multiple Vitamin (MULTIVITAMIN WITH MINERALS) TABS tablet Take 1 tablet by mouth every evening.   Yes [provider]  Multiple Vitamins-Minerals (Robinson) CAPS Take 1 capsule by mouth every  evening.   Yes [provider]  naproxen (NAPROSYN) 500 MG tablet Take 500 mg by mouth daily as needed for moderate pain. 07/24/21  Yes [provider]  Omega-3 Fatty Acids (FISH OIL) 1200 MG CAPS Take by mouth.   Yes [provider]  omeprazole (PRILOSEC) 10 MG capsule Take 10 mg by mouth every evening. 10/16/21  Yes [provider]  Thiamine HCl (VITAMIN B-1) 250 MG tablet Take 125 mg by mouth every evening.   Yes [provider]  vitamin B-12 (CYANOCOBALAMIN) 1000 MCG tablet Take 1,000 mcg by mouth daily.   Yes [provider]    I have reviewed the patient's current medications.  Labs:  Results for orders placed or performed during the hospital encounter of 12/08/21 (from the past 48 hour(s))  Brain natriuretic peptide     Status: Abnormal   Collection Time: 12/11/21  2:05 PM  Result Value Ref Range   B Natriuretic Peptide 733.6 (H) 0.0 - 100.0 pg/mL    Comment: Performed at [facility], [address] [place], [place] [address]  Basic metabolic panel     Status: Abnormal   Collection Time: 12/12/21 12:46 AM  Result Value Ref Range   Sodium 117 (LL) 135 - 145 mmol/L    Comment: CRITICAL RESULT CALLED TO, READ BACK BY AND VERIFIED WITH: TOM JOHNSON,H,RN 12/12/21 0120 WAYK    Potassium 4.0 3.5 - 5.1 mmol/L   Chloride 85 (L) 98 - 111 mmol/L   CO2 21 (L) 22 - 32 mmol/L   Glucose, Bld 116 (H) 70 - 99 mg/dL    Comment: Glucose reference range applies only to samples taken after fasting for at least 8 hours.   BUN 16 8 - 23 mg/dL   Creatinine, Ser 1.03 (H) 0.44 - 1.00 mg/dL   Calcium 8.7 (L) 8.9 - 10.3 mg/dL   GFR, Estimated 52 (L) >60 mL/min    Comment: (NOTE) Calculated using the CKD-EPI Creatinine Equation (2021)    Anion gap 11 5 - 15    Comment: Performed at [facility] [address], [place], [place] [address]  Troponin I (High Sensitivity)     Status: None   Collection Time: 12/12/21  5:18 AM  Result  Value Ref Range   Troponin I (High Sensitivity) 10 <18 ng/L    Comment: (NOTE) Elevated high sensitivity troponin I (hsTnI) values and significant  changes across serial measurements may suggest ACS but many other  chronic and acute conditions are known to elevate hsTnI results.  Refer to the "Links" section for chest pain algorithms and additional  guidance. Performed at [facility], [place] [address] [place], [place] [address]   Basic metabolic panel     Status: Abnormal   Collection Time: 12/12/21  5:18 AM  Result Value Ref Range   Sodium 120 (L) 135 - 145 mmol/L   Potassium 3.9 3.5 - 5.1 mmol/L  Chloride 83 (L) 98 - 111 mmol/L   CO2 24 22 - 32 mmol/L   Glucose, Bld 95 70 - 99 mg/dL    Comment: Glucose reference range applies only to samples taken after fasting for at least 8 hours.   BUN 16 8 - 23 mg/dL   Creatinine, Ser 1.07 (H) 0.44 - 1.00 mg/dL   Calcium 8.9 8.9 - 10.3 mg/dL   GFR, Estimated 50 (L) >60 mL/min    Comment: (NOTE) Calculated using the CKD-EPI Creatinine Equation (2021)    Anion gap 13 5 - 15    Comment: Performed at [facility] [address], [place], [place] [address]  Magnesium     Status: Abnormal   Collection Time: 12/12/21  5:18 AM  Result Value Ref Range   Magnesium 1.6 (L) 1.7 - 2.4 mg/dL    Comment: Performed at [facility] [address], [place], [place] [address]  Urinalysis, Complete w Microscopic Urine, Clean Catch     Status: None   Collection Time: 12/12/21  1:11 PM  Result Value Ref Range   Color, Urine YELLOW YELLOW   APPearance CLEAR CLEAR   Specific Gravity, Urine 1.008 1.005 - 1.030   pH 6.0 5.0 - 8.0   Glucose, UA NEGATIVE NEGATIVE mg/dL   Hgb urine dipstick NEGATIVE NEGATIVE   Bilirubin Urine NEGATIVE NEGATIVE   Ketones, ur NEGATIVE NEGATIVE mg/dL   Protein, ur NEGATIVE NEGATIVE mg/dL   Nitrite NEGATIVE NEGATIVE   Leukocytes,Ua NEGATIVE NEGATIVE   RBC / HPF 0-5 0 - 5 RBC/hpf   WBC, UA 0-5 0 - 5 WBC/hpf    Bacteria, UA NONE SEEN NONE SEEN   Squamous Epithelial / LPF 0-5 0 - 5    Comment: Performed at [facility], [place] [address], [place], [place] [address]  Sodium, urine, random     Status: None   Collection Time: 12/12/21  1:11 PM  Result Value Ref Range   Sodium, Ur 51 mmol/L    Comment: Performed at [facility] [address] [place], [place] [address]  Osmolality, urine     Status: Abnormal   Collection Time: 12/12/21  1:11 PM  Result Value Ref Range   Osmolality, Ur 298 (L) 300 - 900 mOsm/kg    Comment: Performed at [facility] [address] [place], [place] [address]  Basic metabolic panel     Status: Abnormal   Collection Time: 12/13/21 12:58 AM  Result Value Ref Range   Sodium 116 (LL) 135 - 145 mmol/L    Comment: CRITICAL RESULT CALLED TO, READ BACK BY AND VERIFIED WITHDereck Ligas, RN [phone] 12/13/21 A [place]    Potassium 3.9 3.5 - 5.1 mmol/L   Chloride 83 (L) 98 - 111 mmol/L   CO2 22 22 - 32 mmol/L   Glucose, Bld 108 (H) 70 - 99 mg/dL    Comment: Glucose reference range applies only to samples taken after fasting for at least 8 hours.   BUN 16 8 - 23 mg/dL   Creatinine, Ser 0.94 0.44 - 1.00 mg/dL   Calcium 8.6 (L) 8.9 - 10.3 mg/dL   GFR, Estimated 58 (L) >60 mL/min    Comment: (NOTE) Calculated using the CKD-EPI Creatinine Equation (2021)    Anion gap 11 5 - 15    Comment: Performed at [facility] [address] [place], [place] [address]  Cortisol     Status: None   Collection  Time: 12/13/21 12:58 AM  Result Value Ref Range   Cortisol, Plasma 18.5 ug/dL    Comment: (NOTE) AM    6.7 - 22.6 ug/dL PM   <10.0       ug/dL Performed at [facility] [address], [place], [place] [address]     ROS:  Pertinent items noted in HPI and remainder of comprehensive ROS otherwise negative.  Physical Exam: Vitals:   12/13/21 0646 12/13/21 0800  BP:  (!) 144/76  Pulse: 63 64  Resp: (!) 21 16  Temp:  97.9 F (36.6 C)   SpO2: 96% 90%     General exam: Elderly pleasant female sitting up in bed, AAOx3 Neuro: Strength intact. No focal deficits.   HEENT: EOMI. PERRLA Positive JVD CVS: S1-S2, regular rhythm Lungs: Fine basilar Rales, few wheezes noted Abdomen: Soft, nontender, bowel sounds present Extremities: 1+ edema Skin: No rashes Psychiatry: Judgement and insight appear normal. Mood & affect appropriate.  Assessment/Plan: 1.Hyponatremia likely hypervolemic in setting of HF, worsening  -recheck sodium after NaCl bolus  -fluid strict    2. Hypertension/volume   -Hypervolemic; started IV '160mg'$  lasix in D5% BID; anticipate this will also help correct the hyponatremia  -monitor UOP, wgts, BMPs -Continue metoprolol   I have seen and examined this patient and agree with the plan of care.  Despite high dose of diuretics.  Patient's sodium continues to drop.  This is a little unusual.  Patient is drinking large quantities.  I believe she is diluting down her sodium.  We will put her on a more stringent fluid restriction she is getting oral urea.  We will also give her some sodium chloride today to see if this does not improve her sodium.  Sherril Croon 12/13/2021, 6:28 PM   Abelardo Diesel Penninger 12/13/2021, 11:53 AM

## 2021-12-13 NOTE — Progress Notes (Signed)
Progress Note  Patient Name: [name] Date of Encounter: 12/13/2021  [facility] Cardiologist: None   Subjective   She is still short of breath. Wonders if it is her anxiety.  Inpatient Medications    Scheduled Meds:  apixaban  5 mg Oral BID   aspirin EC  81 mg Oral Daily   levalbuterol  0.63 mg Nebulization TID   levothyroxine  200 mcg Oral Q0600   mouth rinse  15 mL Mouth Rinse BID   metoprolol succinate  100 mg Oral Daily   potassium chloride  20 mEq Oral Daily   pravastatin  40 mg Oral q1800   sodium chloride flush  3 mL Intravenous Q12H   vitamin B-1  125 mg Oral QPM   Continuous Infusions:  sodium chloride     furosemide 160 mg (12/12/21 1543)   sodium chloride     PRN Meds: sodium chloride, acetaminophen, albuterol, fluticasone, LORazepam, ondansetron (ZOFRAN) IV, sodium chloride flush   Vital Signs    Vitals:   12/12/21 2320 12/13/21 0341 12/13/21 0637 12/13/21 0646  BP: (!) 147/61 (!) 153/81    Pulse: 60 61  63  Resp: 18 15  (!) 21  Temp: 97.9 F (36.6 C) 97.7 F (36.5 C)    TempSrc: Oral Oral    SpO2: 92% 94%  96%  Weight:   94.3 kg   Height:        Intake/Output Summary (Last 24 hours) at 12/13/2021 0819 Last data filed at 12/13/2021 0400 Gross per 24 hour  Intake 360 ml  Output 1700 ml  Net -1340 ml   Last 3 Weights 12/13/2021 12/11/2021 12/10/2021  Weight (lbs) 207 lb 14.3 oz 207 lb 10.8 oz 207 lb 3.7 oz  Weight (kg) 94.3 kg 94.2 kg 94 kg      Telemetry    Sinus rhythm.  No events. - Personally Reviewed  ECG    Sinus rhythm.  Rate 66 bpm. Right bundle branch block.- Personally Reviewed  Physical Exam   VS:  BP (!) 153/81 (BP Location: Right Arm)    Pulse 63    Temp 97.7 F (36.5 C) (Oral)    Resp (!) 21    Ht 5' 6.5" (1.689 m)    Wt 94.3 kg    SpO2 96%    BMI 33.05 kg/m  , BMI Body mass index is 33.05 kg/m. GENERAL:  Well appearing HEENT: Pupils equal round and reactive, fundi not visualized, oral mucosa  unremarkable NECK:  No jugular venous distention, waveform within normal limits, carotid upstroke brisk and symmetric, no bruits, no thyromegaly LUNGS: Diffuse expiratory wheezing.  No rhonchi or crackles. HEART:  RRR.  PMI not displaced or sustained,S1 and S2 within normal limits, no S3, no S4, no clicks, no rubs, no murmurs ABD:  Flat, positive bowel sounds normal in frequency in pitch, no bruits, no rebound, no guarding, no midline pulsatile mass, no hepatomegaly, no splenomegaly EXT:  2 plus pulses throughout, 1+ lower extremity edema, no cyanosis no clubbing SKIN:  No rashes no nodules NEURO:  Cranial nerves II through XII grossly intact, motor grossly intact throughout PSYCH:  Cognitively intact, oriented to person place and time  Labs    High Sensitivity Troponin:   Recent Labs  Lab 12/08/21 1158 12/12/21 0518  TROPONINIHS 6 10     Chemistry Recent Labs  Lab 12/08/21 1158 12/08/21 1648 12/09/21 0558 12/12/21 0046 12/12/21 0518 12/13/21 0058  NA 125*  --    < >  117* 120* 116*  K 4.1  --    < > 4.0 3.9 3.9  CL 93*  --    < > 85* 83* 83*  CO2 23  --    < > 21* 24 22  GLUCOSE 102*  --    < > 116* 95 108*  BUN 13  --    < > '16 16 16  '$ CREATININE 0.70  --    < > 1.03* 1.07* 0.94  CALCIUM 8.9  --    < > 8.7* 8.9 8.6*  MG  --  1.9  --   --  1.6*  --   PROT 6.8  --   --   --   --   --   ALBUMIN 4.0  --   --   --   --   --   AST 18  --   --   --   --   --   ALT 15  --   --   --   --   --   ALKPHOS 81  --   --   --   --   --   BILITOT 0.5  --   --   --   --   --   GFRNONAA >60  --    < > 52* 50* 58*  ANIONGAP 9  --    < > '11 13 11   '$ < > = values in this interval not displayed.    Lipids No results for input(s): CHOL, TRIG, HDL, LABVLDL, LDLCALC, CHOLHDL in the last 168 hours.  Hematology Recent Labs  Lab 12/09/21 0558 12/10/21 0059 12/11/21 0051  WBC 10.5 10.4 13.3*  RBC 4.45 4.16 4.42  HGB 12.3 11.4* 12.2  HCT 36.7 34.2* 35.5*  MCV 82.5 82.2 80.3  MCH 27.6 27.4  27.6  MCHC 33.5 33.3 34.4  RDW 13.6 13.5 13.2  PLT 322 318 362   Thyroid  Recent Labs  Lab 12/08/21 1648 12/09/21 0558  TSH 7.190*  --   FREET4  --  1.27*    BNP Recent Labs  Lab 12/08/21 1158 12/11/21 1405  BNP 453.8* 733.6*    DDimer  Recent Labs  Lab 12/08/21 1158  DDIMER 0.80*     Radiology    DG Chest 1 View  Result Date: 12/12/2021 CLINICAL DATA:  Shortness of breath EXAM: CHEST  1 VIEW COMPARISON:  Yesterday FINDINGS: Cardiomegaly and vascular pedicle widening with left pleural effusion and diffuse interstitial prominence. No pneumothorax. Much of the left lower lung is obscured. Remote left rib fractures. IMPRESSION: CHF with asymmetric left pleural effusion. No convincing change from yesterday. Electronically Signed   By: Jorje Guild M.D.   On: 12/12/2021 04:28   DG CHEST PORT 1 VIEW  Result Date: 12/11/2021 CLINICAL DATA:  Shortness of breath EXAM: PORTABLE CHEST 1 VIEW COMPARISON:  12/08/2021 FINDINGS: Remote left rib fractures. Midline trachea. Moderate cardiomegaly. Layering left pleural effusion is small and similar. No pneumothorax. Interstitial prominence persists. Left greater than right base airspace disease is not significantly changed. IMPRESSION: Given differences in technique, no significant change since 12/08/2021. Congestive heart failure with layering left pleural effusion. Persistent bibasilar airspace disease, most likely atelectasis. At the left lung base, pneumonia or aspiration cannot be excluded. Electronically Signed   By: Abigail Miyamoto M.D.   On: 12/11/2021 15:41   ECHO TEE  Result Date: 12/11/2021    TRANSESOPHOGEAL ECHO REPORT   Patient Name:   [name]  [facility] Date of Exam: 12/11/2021 Medical Rec #:  [id number]         Height:       66.5 in Accession #:    [id number]        Weight:       207.7 lb Date of Birth:  09-Nov-1930        BSA:          2.043 m Patient Age:    88 years          BP:           99/79 mmHg Patient Gender: F                  HR:           90 bpm. Exam Location:  Inpatient Procedure: 3D Echo, Transesophageal Echo, Cardiac Doppler and Color Doppler Indications:     I48.91* Unspeicified atrial fibrillation  History:         Patient has prior history of Echocardiogram examinations, most                  recent 12/09/2021. CHF, Abnormal ECG, Arrythmias:Atrial                  Fibrillation, Signs/Symptoms:Shortness of Breath and Dyspnea;                  Risk Factors:Hypertension.  Sonographer:     Roseanna Rainbow RDCS Referring Phys:  [id number] [facility] A Gasper Sells Diagnosing Phys: [name] MD PROCEDURE: After discussion of the risks and benefits of a TEE, an informed consent was obtained from the patient. The transesophogeal probe was passed without difficulty through the esophogus of the patient. Imaged were obtained with the patient in a left lateral decubitus position. Sedation performed by different physician. The patient was monitored while under deep sedation. Anesthestetic sedation was provided intravenously by Anesthesiology: '265mg'$  of Propofol. The patient developed no complications during the procedure. A successful direct current cardioversion was performed at 200 joules with 2 attempts. IMPRESSIONS  1. Left ventricular ejection fraction, by estimation, is 45 to 50%. Left ventricular ejection fraction by 3D volume is 46 %. The left ventricle has mildly decreased function. The left ventricle demonstrates global hypokinesis. Left ventricular diastolic  function could not be evaluated.  2. Right ventricular systolic function is normal. The right ventricular size is normal.  3. Left atrial size was mildly dilated. No left atrial/left atrial appendage thrombus was detected.  4. Large pleural effusion in the left lateral region.  5. The mitral valve is grossly normal. Moderate mitral valve regurgitation. No evidence of mitral stenosis.  6. Tricuspid valve regurgitation is mild to moderate.  7. The aortic valve is tricuspid. Aortic  valve regurgitation is mild to moderate. No aortic stenosis is present. Aortic valve mean gradient measures 3.0 mmHg.  8. There is mild (Grade II) plaque involving the descending aorta. Conclusion(s)/Recommendation(s): No LAA thrombus. Successful Cardioversion. FINDINGS  Left Ventricle: Left ventricular ejection fraction, by estimation, is 45 to 50%. Left ventricular ejection fraction by 3D volume is 46 %. The left ventricle has mildly decreased function. The left ventricle demonstrates global hypokinesis. The left ventricular internal cavity size was normal in size. Left ventricular diastolic function could not be evaluated. Right Ventricle: The right ventricular size is normal. Right vetricular wall thickness was not assessed. Right ventricular systolic function is normal. Left Atrium: Left atrial size was mildly dilated. No left atrial/left atrial appendage thrombus was detected. Right Atrium:  Right atrial size was normal in size. Pericardium: Trivial pericardial effusion is present. Mitral Valve: The mitral valve is grossly normal. Moderate mitral valve regurgitation. No evidence of mitral valve stenosis. Tricuspid Valve: The tricuspid valve is normal in structure. Tricuspid valve regurgitation is mild to moderate. No evidence of tricuspid stenosis. Aortic Valve: The aortic valve is tricuspid. Aortic valve regurgitation is mild to moderate. Aortic regurgitation PHT measures 723 msec. No aortic stenosis is present. Aortic valve mean gradient measures 3.0 mmHg. Aortic valve peak gradient measures 7.0 mmHg. Aortic valve area, by VTI measures 2.53 cm. Pulmonic Valve: The pulmonic valve was normal in structure. Pulmonic valve regurgitation is not visualized. Aorta: The aortic root and ascending aorta are structurally normal, with no evidence of dilitation. There is mild (Grade II) plaque involving the descending aorta. IAS/Shunts: No atrial level shunt detected by color flow Doppler. Additional Comments: There is a  large pleural effusion in the left lateral region.  LEFT VENTRICLE PLAX 2D LVOT diam:     2.20 cm LV SV:         59 LV SV Index:   29              3D Volume EF LVOT Area:     3.80 cm        LV 3D EF:    Left                                             ventricul                                             ar                                             ejection                                             fraction                                             by 3D                                             volume is                                             46 %.                                 3D Volume EF  LV 3D EF:    46.40 %                                LV 3D EDV:   85100.00                                             mm                                LV 3D ESV:   45600.00                                             mm                                LV 3D SV:    39500.00                                             mm                                 3D Volume EF:                                3D EF:        46 % AORTIC VALVE AV Area (Vmax):    3.20 cm AV Area (Vmean):   3.35 cm AV Area (VTI):     2.53 cm AV Vmax:           132.00 cm/s AV Vmean:          87.900 cm/s AV VTI:            0.234 m AV Peak Grad:      7.0 mmHg AV Mean Grad:      3.0 mmHg LVOT Vmax:         111.00 cm/s LVOT Vmean:        77.400 cm/s LVOT VTI:          0.156 m LVOT/AV VTI ratio: 0.67 AI PHT:            723 msec MR Peak grad:    72.6 mmHg MR Mean grad:    46.0 mmHg    SHUNTS MR Vmax:         426.00 cm/s  Systemic VTI:  0.16 m MR Vmean:        314.0 cm/s   Systemic Diam: 2.20 cm MR PISA:         1.57 cm MR PISA Eff ROA: 14 mm MR PISA Radius:  0.50 cm [name] MD Electronically signed by [name] MD Signature Date/Time: 12/11/2021/5:00:37 PM    Final     Cardiac Studies   Echo 03/04/16 - Left ventricle: The cavity size was normal. Wall thickness was    normal. Systolic  function was  normal. The estimated ejection    fraction was in the range of 50% to 55%.  - Aortic valve: Calcified non coronary cusp. There was trivial    regurgitation.  - Mitral valve: There was mild regurgitation.  - Left atrium: The atrium was mildly dilated.  - Atrial septum: No defect or patent foramen ovale was identified.   Patient Profile     88 y.o. female with hypertension, hyperlipidemia, and newly diagnosed atrial fibrillation.  Assessment & Plan    # Paroxysmal atrial fibrillation: Newly noted this admission.  She reports 3 weeks of exertional dyspnea and edema.  I suspect this is the timeline of her new onset atrial fibrillation. She is maintaining sinus rhythm after DCCV.  Continue Eliquis and metoprolol.  Will stop aspirin.   # Acute systolic diastolic heart failure: BNP elevated on admission and 3/8.  CXR with L pleural effusion.  Sodium is decreasing with attempts at diuresis and increasing lasix dose.  Creatinine a little better today and she is net -1.3L.  However, her serum sodium continues to worsen despite diuresis.  116 today.  Appreciate nephrology assistance. Echo revealed LVEF 45%.  I suspect this is all in the setting of new onset atrial fibrillation.  She has not had any ischemic symptoms.  Given lack of her ischemic symptoms and her age, we will plan to repeat echo in 3 months.  At that point if her LVEF remains reduced would consider an ischemic evaluation.  BP is elevated.  Continue metoprolol and will add Entresto 24/26 mg bid.   # Hyponatremia: Hyponatremia is worsening with diuresis.  Fluoxetine was reduced.  Nephrology following as above.       For questions or updates, please contact [place] Please consult [email or web address] for contact info under        Signed, [name], MD  12/13/2021, 8:19 AM

## 2021-12-13 NOTE — TOC Benefit Eligibility Note (Signed)
Patient Advocate Encounter ?  ?Insurance verification completed.   ?  ?The patient is currently admitted and upon discharge could be taking ENTRESTO 24-26. ?  ?The current 30 day co-pay is, $47.  ? ?The patient is insured through [employer]. ? ? ?  ? ?

## 2021-12-14 LAB — RENAL FUNCTION PANEL
Albumin: 3 g/dL — ABNORMAL LOW (ref 3.5–5.0)
Albumin: 3.3 g/dL — ABNORMAL LOW (ref 3.5–5.0)
Anion gap: 10 (ref 5–15)
Anion gap: 13 (ref 5–15)
BUN: 32 mg/dL — ABNORMAL HIGH (ref 8–23)
BUN: 28 mg/dL — ABNORMAL HIGH (ref 8–23)
CO2: 24 mmol/L (ref 22–32)
CO2: 23 mmol/L (ref 22–32)
Calcium: 8.3 mg/dL — ABNORMAL LOW (ref 8.9–10.3)
Calcium: 8.4 mg/dL — ABNORMAL LOW (ref 8.9–10.3)
Chloride: 81 mmol/L — ABNORMAL LOW (ref 98–111)
Chloride: 80 mmol/L — ABNORMAL LOW (ref 98–111)
Creatinine, Ser: 0.83 mg/dL (ref 0.44–1.00)
Creatinine, Ser: 0.89 mg/dL (ref 0.44–1.00)
GFR, Estimated: >60 mL/min (ref >60)
GFR, Estimated: >60 mL/min (ref >60)
Glucose, Bld: 121 mg/dL — ABNORMAL HIGH (ref 70–99)
Glucose, Bld: 106 mg/dL — ABNORMAL HIGH (ref 70–99)
Phosphorus: 3.8 mg/dL (ref 2.5–4.6)
Phosphorus: 4.1 mg/dL (ref 2.5–4.6)
Potassium: 3.7 mmol/L (ref 3.5–5.1)
Potassium: 3.9 mmol/L (ref 3.5–5.1)
Sodium: 115 mmol/L — CL (ref 135–145)
Sodium: 116 mmol/L — CL (ref 135–145)

## 2021-12-14 MED ORDER — POLYETHYLENE GLYCOL 3350 17 G PO PACK
17.0000 g | PACK | Freq: Two times a day (BID) | ORAL | Status: DC
  Administered 2021-12-14 – 2021-12-17 (×4): 17 g via ORAL
  Filled 2021-12-14 (×10): qty 1

## 2021-12-14 MED ORDER — METOPROLOL SUCCINATE ER 50 MG PO TB24
50.0000 mg | ORAL_TABLET | Freq: Every day | ORAL | Status: DC
Start: 2021-12-14 — End: 2021-12-22
  Administered 2021-12-14 – 2021-12-22 (×9): 50 mg via ORAL
  Filled 2021-12-14 (×8): qty 1

## 2021-12-14 MED ORDER — ENSURE ENLIVE PO LIQD
237.0000 mL | Freq: Two times a day (BID) | ORAL | Status: DC
  Administered 2021-12-15 – 2021-12-22 (×14): 237 mL via ORAL

## 2021-12-14 MED ORDER — SODIUM CHLORIDE 1 G PO TABS
2.0000 g | ORAL_TABLET | Freq: Three times a day (TID) | ORAL | Status: DC
  Administered 2021-12-14 – 2021-12-15 (×5): 2 g via ORAL
  Filled 2021-12-14 (×7): qty 2

## 2021-12-14 NOTE — Progress Notes (Signed)
?Progress Note ? ? ?Patient: PORSHA SKILTON BMW:[id number] DOB: 1931-09-08 DOA: 12/08/2021     6 ?DOS: the patient was seen and examined on 12/14/2021 ?  ?Brief hospital course: ?Mrs. Campanella was admitted to the hospital with the working diagnosis of acute on chronic heart failure decompensation.  ? ?88 yo female with the past medical history of hypertension, dyslipidemia, and hypothyroid who presented with dyspnea. She reported dyspnea on exertion that became acutely worse at 4 am, prompting her to come to the hospital. She also reported recent worsening of cough and feeling palpitations. On her initial physical examination her blood pressure was 115/77, HR 109, RR 22 and 02 saturation 93%, heart with S1 and S2 present, irregularly irregular, with no gallops or rubs, lungs with intermittent rales and positive wheezing, abdomen soft and positive lower extremity edema.  ? ?Na 125, K 4,1 CL 93, bicarbonate 23, glucose 102, bun 13 cr 0,70 ?BNP 453 ?Wbc 8,7. Hgb 11,9 hct 36,5 Plt 323  ?D dimer 0,80 ?Sars covid 19 negative  ? ?Chest radiograph with increased lung markings bilaterally, small bilateral pleural effusions. ? ?EKG 115 bpm, normal axis, qtc 525, right bundle branch block, atrial fibrillation rhythm, no significant ST segment or T wave changes.   ? ?Patient was placed on furosemide for diuresis.  ? ?Cardiology was consulted with recommendations for cardioversion.  ?03/08 direct current cardioversion with conversion to sinus rhythm.  ? ?Patient with worsening hyponatremia, nephrology consulted.  ? ?Patient was placed on aggressive diuresis with furosemide for hypervolemia.  ? ? ?Assessment and Plan: ?Acute CHF (congestive heart failure) ([place]) ?Echocardiogram with LV EF 45% with global hypokinesis, mild reduction of RV systolic function, RSVP 27,2. Moderate mitral regurgitation. Mild dilatation of ascending aorta.  ? ?Urine output is 3,400 cc over last 24 hrs with improvement in her symptoms.  ?Systolic blood  pressure 536 to 129 mmHg  ?Warm lower extremities.  ? ?Edema continue to improve.  ?Continue diuresis. ?Heart failure management with entresto and metoprolol.  ? ?Acute hypoxemic respiratory failure, continue with supplemental 02 per [place], diuresis for cardiogenic pulmonary edema. ?Continue with inhaled corticosteroids, will need outpatient pulmonary function testing.  ? ?Atrial fibrillation with RVR ([place]) ?SP cardioversion recovered sinus rhythm. ?Rate control with metoprolol and anticoagulation with apixaban.  ?HR in the 60  ? ?  ? ?Essential hypertension ?Blood pressure is controlled with entresto and metoprolol.  ?Continue close blood pressure monitoring.  ? ?Hyponatremia ?Hypokalemia/ hypomagnesemia.  ? ?Continue to improve volume status. ?Na is 116 with K at 3,9 and serum bicarbonate at 23, cr 0,89. ?Plan to continue diuresis with furosemide and follow up renal function and Na closely.  ? ?Increase solute intake to improve urine volume and water excretion.  ?Consult nutrition and add ensure.  ? ? ?Normocytic anemia ?Follow up cell count in am.  ?hgb and hct have been stable.  ? ?Hypothyroidism ?Patient with high TSH along with low free T4 and free T3. ?Continue with levothyroxine and follow up thyroid function test in 3 weeks as outpatient.  ? ? ?Class 1 obesity ?Calculated BMI is 33,0 ?Plan to encourage mobility, out of bed to chair tid with meals. ?Pt and Ot.  ? ?SIRS (systemic inflammatory response syndrome) (HCC)-resolved as of 12/11/2021 ?- Clinically do not suspect pneumonia, chest x-ray findings are suggestive of CHF and atelectasis ?-Afebrile, no leukocytosis, lactate and procalcitonin are normal ?-discontinued antibiotics yesterday ? ? ? ? ?  ? ?Subjective: this am patient is feeling better with improved dyspnea.  ? ?  Physical Exam: ?Vitals:  ? 12/14/21 0619 12/14/21 0707 12/14/21 0740 12/14/21 0900  ?BP:   129/62   ?Pulse:  60 (!) 59 60  ?Resp:  20 16   ?Temp:   97.6 ?F (36.4 ?C)   ?TempSrc:   Oral    ?SpO2:  96% 95%   ?Weight: 92.3 kg     ?Height:      ? ?Neurology awake and alert ?ENT with no pallor ?Cardiovascular with S1 and S2 present and rhythmic, with no gallops, rubs or murmurs. ?Mild JVD ?Trace lower extremity edema ?Respiratory with prolonged expiratory phase, no wheezing or rales ?Abdomen protuberant  ?Data Reviewed: ? ? ? ?Family Communication: no family at the bedside  ? ?Disposition: ?Status is: Inpatient ?Remains inpatient appropriate because: hyponatremia  ? Planned Discharge Destination: Home ? ? ? ? ? ?Author: ?Tawni Millers, MD ?12/14/2021 10:04 AM ? ?For on call review [email or web address].  ?

## 2021-12-14 NOTE — Progress Notes (Signed)
[facility] Renal Progress Note  Requesting MD:  Mauricio Arrien Indication for Consultation: Hyponatremia   HPI:  [name] is a 88 y.o. female with phmx of HTN, HLD, hypothroid who presented to the ED on 3/5 for dyspnea and admitted for acute on chronic HF and afib w/ RVR (cardioverted 3/8). TEE EF of 45%. Nephrology was consulted d/t the patient's ongoing hyponatremia. Sodium of 125 at admission; continuing to downtrend.      Cortisol WNL. Urine Sodium 51. Urine osm 298. (Labs drawn while patient was on loop diuretic)  Blood pressure 129/62 pulse 83 temperature 97.6 O2 sats 96% FiO2 32%  Sodium 116 potassium 3.9 chloride 80 CO2 23 BUN 28 creatinine 0.89 glucose 106 calcium 8.4 phosphorus 4.1 albumin 3.3.  Urinalysis bland next urine specific gravity very dilute.  Urine microscopy unremarkable     Creatinine, Ser  Date/Time Value Ref Range Status  12/14/2021 06:16 AM 0.89 0.44 - 1.00 mg/dL Final  12/14/2021 12:42 AM 0.83 0.44 - 1.00 mg/dL Final  12/13/2021 05:31 PM 0.88 0.44 - 1.00 mg/dL Final  12/13/2021 11:03 AM 0.87 0.44 - 1.00 mg/dL Final  12/13/2021 12:58 AM 0.94 0.44 - 1.00 mg/dL Final  12/12/2021 05:18 AM 1.07 (H) 0.44 - 1.00 mg/dL Final  12/12/2021 12:46 AM 1.03 (H) 0.44 - 1.00 mg/dL Final  12/11/2021 12:51 AM 1.05 (H) 0.44 - 1.00 mg/dL Final  12/10/2021 12:59 AM 0.87 0.44 - 1.00 mg/dL Final  12/09/2021 05:58 AM 0.86 0.44 - 1.00 mg/dL Final  12/08/2021 11:58 AM 0.70 0.44 - 1.00 mg/dL Final     PMHx:   Past Medical History:  Diagnosis Date   Dyspnea    diastolic dysfunction by echo 2012   GERD (gastroesophageal reflux disease)    Hypertension    Mild mitral and aortic regurgitation    Osteoarthritis    Right bundle branch block 2014   Thyroid disease     Past Surgical History:  Procedure Laterality Date   arm surgery     following an accident   CARDIOVERSION N/A 12/11/2021   Procedure: CARDIOVERSION;  Surgeon: Werner Lean, MD;  Location: [facility];  Service: Cardiovascular;  Laterality: N/A;   KNEE SURGERY     Following an accident   TEE WITHOUT CARDIOVERSION N/A 12/11/2021   Procedure: TRANSESOPHAGEAL ECHOCARDIOGRAM (TEE);  Surgeon: Werner Lean, MD;  Location[facility] ENDOSCOPY;  Service: Cardiovascular;  Laterality: N/A;   TONSILLECTOMY AND ADENOIDECTOMY     VAGINAL HYSTERECTOMY      Family Hx:  Family History  Problem Relation Age of Onset   Heart attack Mother     Social History:  reports that she has never smoked. She has never used smokeless tobacco. She reports that she does not drink alcohol and does not use drugs.  Allergies: No Known Allergies  Medications: Prior to Admission medications   Medication Sig Start Date End Date Taking? Authorizing Provider  amLODipine (NORVASC) 2.5 MG tablet Take 2.5 mg by mouth daily.   Yes [provider]  aspirin 81 MG tablet Take 81 mg by mouth daily.   Yes [provider]  Calcium Carbonate-Vitamin D 500-125 MG-UNIT TABS Take 1 tablet by mouth every evening.   Yes [provider]  Cholecalciferol (VITAMIN D) 50 MCG (2000 UT) CAPS Take 2,000 Units by mouth daily. 03/26/21  Yes [provider]  FLUoxetine (PROZAC) 40 MG capsule Take 40 mg by mouth every morning.   Yes [provider]  fluticasone (FLONASE) 50  MCG/ACT nasal spray Place 1 spray into both nostrils daily as needed for allergies or rhinitis.   Yes [provider]  levothyroxine (SYNTHROID, LEVOTHROID) 150 MCG tablet Take 150-225 mcg by mouth See admin instructions. Takes 150 mg on Thursday, Friday and Saturday and 225 mg on all other days.   Yes [provider]  LORazepam (ATIVAN) 0.5 MG tablet Take 0.5 mg by mouth daily as needed for anxiety. 03/18/21  Yes [provider]  lovastatin (MEVACOR) 40 MG tablet Take 60 mg by mouth at bedtime.   Yes [provider]  Multiple Vitamin (MULTIVITAMIN WITH MINERALS) TABS  tablet Take 1 tablet by mouth every evening.   Yes [provider]  Multiple Vitamins-Minerals ([place]) CAPS Take 1 capsule by mouth every evening.   Yes [provider]  naproxen (NAPROSYN) 500 MG tablet Take 500 mg by mouth daily as needed for moderate pain. 07/24/21  Yes [provider]  Omega-3 Fatty Acids (FISH OIL) 1200 MG CAPS Take by mouth.   Yes [provider]  omeprazole (PRILOSEC) 10 MG capsule Take 10 mg by mouth every evening. 10/16/21  Yes [provider]  Thiamine HCl (VITAMIN B-1) 250 MG tablet Take 125 mg by mouth every evening.   Yes [provider]  vitamin B-12 (CYANOCOBALAMIN) 1000 MCG tablet Take 1,000 mcg by mouth daily.   Yes [provider]    I have reviewed the patient's current medications.  Labs:  Results for orders placed or performed during the hospital encounter of 12/08/21 (from the past 48 hour(s))  Urinalysis, Complete w Microscopic Urine, Clean Catch     Status: None   Collection Time: 12/12/21  1:11 PM  Result Value Ref Range   Color, Urine YELLOW YELLOW   APPearance CLEAR CLEAR   Specific Gravity, Urine 1.008 1.005 - 1.030   pH 6.0 5.0 - 8.0   Glucose, UA NEGATIVE NEGATIVE mg/dL   Hgb urine dipstick NEGATIVE NEGATIVE   Bilirubin Urine NEGATIVE NEGATIVE   Ketones, ur NEGATIVE NEGATIVE mg/dL   Protein, ur NEGATIVE NEGATIVE mg/dL   Nitrite NEGATIVE NEGATIVE   Leukocytes,Ua NEGATIVE NEGATIVE   RBC / HPF 0-5 0 - 5 RBC/hpf   WBC, UA 0-5 0 - 5 WBC/hpf   Bacteria, UA NONE SEEN NONE SEEN   Squamous Epithelial / LPF 0-5 0 - 5    Comment: Performed at [facility], [place] [address] [place], [place] [address]  Sodium, urine, random     Status: None   Collection Time: 12/12/21  1:11 PM  Result Value Ref Range   Sodium, Ur 51 mmol/L    Comment: Performed at [facility] [address] [place], [place] [address]  Osmolality, urine     Status: Abnormal    Collection Time: 12/12/21  1:11 PM  Result Value Ref Range   Osmolality, Ur 298 (L) 300 - 900 mOsm/kg    Comment: Performed at [facility] [address] [place], [place] [address]  Basic metabolic panel     Status: Abnormal   Collection Time: 12/13/21 12:58 AM  Result Value Ref Range   Sodium 116 (LL) 135 - 145 mmol/L    Comment: CRITICAL RESULT CALLED TO, READ BACK BY AND VERIFIED WITHDereck Ligas, RN [phone] 12/13/21 A GASK[place]    Potassium 3.9 3.5 - 5.1 mmol/L   Chloride 83 (L) 98 - 111 mmol/L   CO2 22 22 - 32 mmol/L  Glucose, Bld 108 (H) 70 - 99 mg/dL    Comment: Glucose reference range applies only to samples taken after fasting for at least 8 hours.   BUN 16 8 - 23 mg/dL   Creatinine, Ser 0.94 0.44 - 1.00 mg/dL   Calcium 8.6 (L) 8.9 - 10.3 mg/dL   GFR, Estimated 58 (L) >60 mL/min    Comment: (NOTE) Calculated using the CKD-EPI Creatinine Equation (2021)    Anion gap 11 5 - 15    Comment: Performed at [facility] [address] [place], [place] [address]  Cortisol     Status: None   Collection Time: 12/13/21 12:58 AM  Result Value Ref Range   Cortisol, Plasma 18.5 ug/dL    Comment: (NOTE) AM    6.7 - 22.6 ug/dL PM   <10.0       ug/dL Performed at [facility] [address] [place], [place] [address]   Renal function panel     Status: Abnormal   Collection Time: 12/13/21 11:03 AM  Result Value Ref Range   Sodium 113 (LL) 135 - 145 mmol/L    Comment: CRITICAL RESULT CALLED TO, READ BACK BY AND VERIFIED WITH: SICKLES,K RN @ 1215 12/13/21 LEONARD,A    Potassium 3.8 3.5 - 5.1 mmol/L   Chloride 81 (L) 98 - 111 mmol/L   CO2 22 22 - 32 mmol/L   Glucose, Bld 113 (H) 70 - 99 mg/dL    Comment: Glucose reference range applies only to samples taken after fasting for at least 8 hours.   BUN 17 8 - 23 mg/dL   Creatinine, Ser 0.87 0.44 - 1.00 mg/dL   Calcium 8.1 (L) 8.9 - 10.3 mg/dL   Phosphorus 3.4 2.5 - 4.6 mg/dL   Albumin 3.1 (L) 3.5 - 5.0 g/dL    GFR, Estimated >60 >60 mL/min    Comment: (NOTE) Calculated using the CKD-EPI Creatinine Equation (2021)    Anion gap 10 5 - 15    Comment: Performed at [facility] [address] [place], [place] [address]  Comprehensive metabolic panel     Status: Abnormal   Collection Time: 12/13/21  5:31 PM  Result Value Ref Range   Sodium 115 (LL) 135 - 145 mmol/L    Comment: CRITICAL RESULT CALLED TO, READ BACK BY AND VERIFIED WITH: [facility] RN 1855 12/13/2021 BY R VERAAR    Potassium 3.3 (L) 3.5 - 5.1 mmol/L   Chloride 80 (L) 98 - 111 mmol/L   CO2 25 22 - 32 mmol/L   Glucose, Bld 116 (H) 70 - 99 mg/dL    Comment: Glucose reference range applies only to samples taken after fasting for at least 8 hours.   BUN 17 8 - 23 mg/dL   Creatinine, Ser 0.88 0.44 - 1.00 mg/dL   Calcium 8.0 (L) 8.9 - 10.3 mg/dL   Total Protein 6.1 (L) 6.5 - 8.1 g/dL   Albumin 3.1 (L) 3.5 - 5.0 g/dL   AST 22 15 - 41 U/L   ALT 20 0 - 44 U/L   Alkaline Phosphatase 80 38 - 126 U/L   Total Bilirubin 0.6 0.3 - 1.2 mg/dL   GFR, Estimated >60 >60 mL/min    Comment: (NOTE) Calculated using the CKD-EPI Creatinine Equation (2021)    Anion gap 10 5 - 15    Comment: Performed at [facility], [place] [address], [place], [place] [address]  Phosphorus     Status: None  Collection Time: 12/13/21  6:35 PM  Result Value Ref Range   Phosphorus 3.5 2.5 - 4.6 mg/dL    Comment: Performed at [facility], [place] [address] [place], [place] [address]  Renal function panel     Status: Abnormal   Collection Time: 12/14/21 12:42 AM  Result Value Ref Range   Sodium 115 (LL) 135 - 145 mmol/L    Comment: CRITICAL RESULT CALLED TO, READ BACK BY AND VERIFIED WITH: MCKINNEY C,RN 12/14/21 0155 WAYK    Potassium 3.7 3.5 - 5.1 mmol/L   Chloride 81 (L) 98 - 111 mmol/L   CO2 24 22 - 32 mmol/L   Glucose, Bld 121 (H) 70 - 99 mg/dL    Comment: Glucose reference range applies only to samples taken after fasting for at least 8 hours.    BUN 32 (H) 8 - 23 mg/dL   Creatinine, Ser 0.83 0.44 - 1.00 mg/dL   Calcium 8.3 (L) 8.9 - 10.3 mg/dL   Phosphorus 3.8 2.5 - 4.6 mg/dL   Albumin 3.0 (L) 3.5 - 5.0 g/dL   GFR, Estimated >60 >60 mL/min    Comment: (NOTE) Calculated using the CKD-EPI Creatinine Equation (2021)    Anion gap 10 5 - 15    Comment: Performed at [facility] [address] [place], [place] [address]  Renal function panel     Status: Abnormal   Collection Time: 12/14/21  6:16 AM  Result Value Ref Range   Sodium 116 (LL) 135 - 145 mmol/L    Comment: CRITICAL RESULT CALLED TO, READ BACK BY AND VERIFIED WITH: MCKINNEY C,RN 12/14/21 0652 WAYK    Potassium 3.9 3.5 - 5.1 mmol/L   Chloride 80 (L) 98 - 111 mmol/L   CO2 23 22 - 32 mmol/L   Glucose, Bld 106 (H) 70 - 99 mg/dL    Comment: Glucose reference range applies only to samples taken after fasting for at least 8 hours.   BUN 28 (H) 8 - 23 mg/dL   Creatinine, Ser 0.89 0.44 - 1.00 mg/dL   Calcium 8.4 (L) 8.9 - 10.3 mg/dL   Phosphorus 4.1 2.5 - 4.6 mg/dL   Albumin 3.3 (L) 3.5 - 5.0 g/dL   GFR, Estimated >60 >60 mL/min    Comment: (NOTE) Calculated using the CKD-EPI Creatinine Equation (2021)    Anion gap 13 5 - 15    Comment: Performed at [facility] [address], [place], [place] [address]    ROS:  Pertinent items noted in HPI and remainder of comprehensive ROS otherwise negative.  Physical Exam: Vitals:   12/14/21 0740 12/14/21 0900  BP: 129/62   Pulse: (!) 59 60  Resp: 16   Temp: 97.6 F (36.4 C)   SpO2: 95%      General exam: Elderly pleasant female sitting up in bed, AAOx3 Neuro: Strength intact. No focal deficits.   HEENT: EOMI. PERRLA Positive JVD CVS: S1-S2, regular rhythm Lungs: Fine basilar Rales, few wheezes noted Abdomen: Soft, nontender, bowel sounds present Extremities: 1+ edema Skin: No rashes Psychiatry: Judgement and insight appear normal. Mood & affect appropriate.  Assessment/Plan: 1.Hyponatremia likely  hypervolemic in setting of HF, worsening  -Complex situation multifactorial.  May have an element of SIADH.  Urine specific gravity appears maximally dilute.  She makes me think this could be poor solute intake.  I therefore maximized her solute intake during the day and encouraged her to reduce her liquid intake.  She has eaten very  little breakfast and is drinking her coffee at this time.  Urine output is good with very high doses of loop diuretics.  Anticipate that she is going to turn the corner in his sodium is going to start to increase.  We shall continue to monitor her sodium during the course of the day.    2. Hypertension/volume   -Hypervolemic; she continues on IV Lasix 160 mg every 8 hours.   Sherril Croon 12/14/2021, 9:21 AM   Sherril Croon 12/14/2021, 9:21 AM

## 2021-12-14 NOTE — Progress Notes (Addendum)
Date and time results received: 12/14/21 0155 ? ? ? ?Test: Na +  ?Critical Value: 115 ? ?Name of Provider Notified: Cyd Silence, MD  ? ?Orders Received? See Orders for repeat renal lab ? ?

## 2021-12-14 NOTE — Plan of Care (Signed)

## 2021-12-14 NOTE — Progress Notes (Addendum)
? ?Progress Note ? ?Patient Name: [name] ?Date of Encounter: 12/14/2021 ? ?[facility] Cardiologist: None  ? ?Patient Profile  ?   ?88 y.o. female with hypertension, hyperlipidemia, and newly diagnosed atrial fibrillation >> DCCV. ?Echo ?On anticoagulation with Apixaban   ? ? ? ?Subjective  ? ?Feeling better  still with mild shortness of breath in bed ? ?Inpatient Medications  ?  ?Scheduled Meds: ? apixaban  5 mg Oral BID  ? levothyroxine  200 mcg Oral Q0600  ? mouth rinse  15 mL Mouth Rinse BID  ? metoprolol succinate  100 mg Oral Daily  ? mometasone-formoterol  2 puff Inhalation BID  ? potassium chloride  20 mEq Oral Daily  ? pravastatin  40 mg Oral q1800  ? sacubitril-valsartan  1 tablet Oral BID  ? sodium chloride flush  3 mL Intravenous Q12H  ? sodium chloride  2 g Oral TID WC  ? vitamin B-1  125 mg Oral QPM  ? ?Continuous Infusions: ? sodium chloride 20 mL/hr at 12/13/21 2153  ? furosemide Stopped (12/14/21 3154)  ? ?PRN Meds: ?sodium chloride, acetaminophen, albuterol, fluticasone, LORazepam, ondansetron (ZOFRAN) IV, sodium chloride flush  ? ?Vital Signs  ?  ?Vitals:  ? 12/14/21 0409 12/14/21 0619 12/14/21 0707 12/14/21 0740  ?BP: 125/60   129/62  ?Pulse: 60  60 (!) 59  ?Resp: '18  20 16  '$ ?Temp: 97.6 ?F (36.4 ?C)   97.6 ?F (36.4 ?C)  ?TempSrc: Oral   Oral  ?SpO2: 96%  96% 95%  ?Weight:  92.3 kg    ?Height:      ? ? ?Intake/Output Summary (Last 24 hours) at 12/14/2021 0800 ?Last data filed at 12/14/2021 0741 ?Gross per 24 hour  ?Intake 497.96 ml  ?Output 3900 ml  ?Net -3402.04 ml  ? ? ?Last 3 Weights 12/14/2021 12/13/2021 12/11/2021  ?Weight (lbs) 203 lb 7.8 oz 207 lb 14.3 oz 207 lb 10.8 oz  ?Weight (kg) 92.3 kg 94.3 kg 94.2 kg  ?   ? ?Telemetry  ?  ?Sinus rhythm.  No events. - Personally Reviewed ? ?ECG  ?  ?Sinus rhythm.  Rate 66 bpm. Right bundle branch block.- Personally Reviewed ? ?Physical Exam  ? ?VS:  BP 129/62 (BP Location: Right Arm)   Pulse (!) 59   Temp 97.6 ?F (36.4 ?C) (Oral)   Resp 16    Ht 5' 6.5" (1.689 m)   Wt 92.3 kg   SpO2 95%   BMI 32.35 kg/m?  , BMI Body mass index is 32.35 kg/m?. ?Well developed and nourished in no acute distress ?HENT normal ?Neck supple with JVP-  flat   ?Crackles 1/2 on L and basilar on R ?Regular rate and rhythm, no murmurs or gallops ?Abd-soft with active BS ?No Clubbing cyanosis tr edema ?Skin-warm and dry ?A & Oriented  Grossly normal sensory and motor function ?  ? ?Labs  ?  ?High Sensitivity Troponin:   ?Recent Labs  ?Lab 12/08/21 ?1158 12/12/21 ?0518  ?TROPONINIHS 6 10  ? ?   ?Chemistry ?Recent Labs  ?Lab 12/08/21 ?1158 12/08/21 ?1648 12/09/21 ?0086 12/12/21 ?0518 12/13/21 ?7619 12/13/21 ?1731 12/14/21 ?5093 12/14/21 ?2671  ?NA 125*  --    < > 120*   < > 115* 115* 116*  ?K 4.1  --    < > 3.9   < > 3.3* 3.7 3.9  ?CL 93*  --    < > 83*   < > 80* 81* 80*  ?CO2 23  --    < >  24   < > '25 24 23  '$ ?GLUCOSE 102*  --    < > 95   < > 116* 121* 106*  ?BUN 13  --    < > 16   < > 17 32* 28*  ?CREATININE 0.70  --    < > 1.07*   < > 0.88 0.83 0.89  ?CALCIUM 8.9  --    < > 8.9   < > 8.0* 8.3* 8.4*  ?MG  --  1.9  --  1.6*  --   --   --   --   ?PROT 6.8  --   --   --   --  6.1*  --   --   ?ALBUMIN 4.0  --   --   --    < > 3.1* 3.0* 3.3*  ?AST 18  --   --   --   --  22  --   --   ?ALT 15  --   --   --   --  20  --   --   ?ALKPHOS 81  --   --   --   --  80  --   --   ?BILITOT 0.5  --   --   --   --  0.6  --   --   ?GFRNONAA >60  --    < > 50*   < > >60 >60 >60  ?ANIONGAP 9  --    < > 13   < > '10 10 13  '$ ? < > = values in this interval not displayed.  ? ?  ?Lipids No results for input(s): CHOL, TRIG, HDL, LABVLDL, LDLCALC, CHOLHDL in the last 168 hours.  ?Hematology ?Recent Labs  ?Lab 12/09/21 ?8676 12/10/21 ?0059 12/11/21 ?1950  ?WBC 10.5 10.4 13.3*  ?RBC 4.45 4.16 4.42  ?HGB 12.3 11.4* 12.2  ?HCT 36.7 34.2* 35.5*  ?MCV 82.5 82.2 80.3  ?MCH 27.6 27.4 27.6  ?MCHC 33.5 33.3 34.4  ?RDW 13.6 13.5 13.2  ?PLT 322 318 362  ? ? ?Thyroid  ?Recent Labs  ?Lab 12/08/21 ?1648 12/09/21 ?9326   ?TSH 7.190*  --   ?FREET4  --  1.27*  ? ?  ?BNP ?Recent Labs  ?Lab 12/08/21 ?1158 12/11/21 ?1405  ?BNP 453.8* 733.6*  ? ?  ?DDimer  ?Recent Labs  ?Lab 12/08/21 ?1158  ?DDIMER 0.80*  ? ?  ? ?Radiology  ?  ?No results found. ? ?Cardiac Studies  ? ?Echo 03/04/16 ?- Left ventricle: The cavity size was normal. Wall thickness was  ?  normal. Systolic function was normal. The estimated ejection  ?  fraction was in the range of 50% to 55%.  ?- Aortic valve: Calcified non coronary cusp. There was trivial  ?  regurgitation.  ?- Mitral valve: There was mild regurgitation.  ?- Left atrium: The atrium was mildly dilated.  ?- Atrial septum: No defect or patent foramen ovale was identified.  ? ?Assessment & Plan  ?  ?  ? ?Atrial fibrillation - persistent  s/p DCCV ? ?CHF acute/chronic  ? ?Cardiomyopathy ? Mechanism  ? ?Hyponatremia -- thought 2/2 dilution in setting of HF ? SIADH component ? ?Sinus bradycardia  ? ?Hypothyroidism - iatrogenic TSH high, T4 high T3 low  ? ? ? ? ?Metoprolol and entresto for cardiomyopathy ? ?Holding sinus, continue anticoagulation with Apixaban   ? ?Continue diuretics  urine output improving  if lungs do not improve may need further imaging ? ?  Will decrease metoprolol for sinus bradycardia  ? ?Will prob need a wearable technology at discharge to identify recurrent atrial fib ? ? ? ?   ? ?For questions or updates, please contact [place] ?Please consult [email or web address] for contact info under  ? ?  ?   ?Signed, ?Virl Axe, MD  ?12/14/2021, 8:00 AM   ? ?

## 2021-12-14 NOTE — Progress Notes (Signed)
HOSPITAL MEDICINE OVERNIGHT EVENT NOTE   ? ?Notified by nursing that chemistry this morning reveals persisting substantial hyponatremia of 115. ? ?Per my discussion with nursing patient is not exhibiting any associated neurologic symptoms with the exception of being somewhat anxious which I do not believe is related. ? ?Chart reviewed, patient continues to receive diuretics.  Day team including nephrology feel the patient is still suffering from a hypervolemic hyponatremia. ? ?We will go ahead and order another chemistry to be performed later today to ensure that this degree of hyponatremia remained stable.  To avoid neurologic complications tolvaptan may need to be considered.  ? ?Vernelle Emerald  MD ?[facility]  ? ? ? ? ? ? ? ? ? ? ?

## 2021-12-15 DIAGNOSIS — R001 Bradycardia, unspecified: Secondary | ICD-10-CM

## 2021-12-15 LAB — RENAL FUNCTION PANEL
Albumin: 2.9 g/dL — ABNORMAL LOW (ref 3.5–5.0)
Anion gap: 9 (ref 5–15)
BUN: 22 mg/dL (ref 8–23)
CO2: 29 mmol/L (ref 22–32)
Calcium: 8.1 mg/dL — ABNORMAL LOW (ref 8.9–10.3)
Chloride: 82 mmol/L — ABNORMAL LOW (ref 98–111)
Creatinine, Ser: 0.88 mg/dL (ref 0.44–1.00)
GFR, Estimated: >60 mL/min (ref >60)
Glucose, Bld: 102 mg/dL — ABNORMAL HIGH (ref 70–99)
Phosphorus: 3.8 mg/dL (ref 2.5–4.6)
Potassium: 3.5 mmol/L (ref 3.5–5.1)
Sodium: 120 mmol/L — ABNORMAL LOW (ref 135–145)

## 2021-12-15 MED ORDER — POTASSIUM CHLORIDE CRYS ER 20 MEQ PO TBCR
40.0000 meq | EXTENDED_RELEASE_TABLET | Freq: Once | ORAL | Status: AC
  Administered 2021-12-15: 40 meq via ORAL
  Filled 2021-12-15: qty 2

## 2021-12-15 MED ORDER — BISACODYL 5 MG PO TBEC
10.0000 mg | DELAYED_RELEASE_TABLET | Freq: Once | ORAL | Status: AC
  Administered 2021-12-15: 10 mg via ORAL
  Filled 2021-12-15: qty 2

## 2021-12-15 MED ORDER — SENNOSIDES-DOCUSATE SODIUM 8.6-50 MG PO TABS
2.0000 | ORAL_TABLET | Freq: Two times a day (BID) | ORAL | Status: DC
  Administered 2021-12-17 – 2021-12-20 (×2): 2 via ORAL
  Filled 2021-12-15 (×9): qty 2

## 2021-12-15 NOTE — Plan of Care (Signed)

## 2021-12-15 NOTE — Progress Notes (Signed)
? ?Progress Note ? ?Patient Name: [name] ?Date of Encounter: 12/15/2021 ? ?[facility] Cardiologist: None  ? ?Patient Profile  ?   ?88 y.o. female with hypertension, hyperlipidemia, and newly diagnosed atrial fibrillation >> DCCV. ?Echo   EF 45 %  --MR mod ?On anticoagulation with Apixaban   ? ? ? ?Subjective  ? ?Confused last night ?Some shortness of breath  ? ?Inpatient Medications  ?  ?Scheduled Meds: ? apixaban  5 mg Oral BID  ? feeding supplement  237 mL Oral BID BM  ? levothyroxine  200 mcg Oral Q0600  ? mouth rinse  15 mL Mouth Rinse BID  ? metoprolol succinate  50 mg Oral Daily  ? mometasone-formoterol  2 puff Inhalation BID  ? polyethylene glycol  17 g Oral BID  ? potassium chloride  20 mEq Oral Daily  ? pravastatin  40 mg Oral q1800  ? sacubitril-valsartan  1 tablet Oral BID  ? sodium chloride flush  3 mL Intravenous Q12H  ? sodium chloride  2 g Oral TID WC  ? vitamin B-1  125 mg Oral QPM  ? ?Continuous Infusions: ? sodium chloride 20 mL/hr at 12/13/21 2153  ? furosemide 66 mL/hr at 12/15/21 0654  ? ?PRN Meds: ?sodium chloride, acetaminophen, albuterol, fluticasone, LORazepam, ondansetron (ZOFRAN) IV, sodium chloride flush  ? ?Vital Signs  ?  ?Vitals:  ? 12/15/21 0313 12/15/21 8828 12/15/21 0721 12/15/21 0726  ?BP: (!) 118/54  (!) 139/58   ?Pulse: (!) 57  (!) 57 64  ?Resp: '20  18 16  '$ ?Temp: 98.1 ?F (36.7 ?C)  97.6 ?F (36.4 ?C)   ?TempSrc: Oral  Oral   ?SpO2: 95%  93% 94%  ?Weight:  90.6 kg    ?Height:      ? ? ?Intake/Output Summary (Last 24 hours) at 12/15/2021 0749 ?Last data filed at 12/15/2021 0654 ?Gross per 24 hour  ?Intake 531.04 ml  ?Output 2125 ml  ?Net -1593.96 ml  ? ? ?Last 3 Weights 12/15/2021 12/14/2021 12/13/2021  ?Weight (lbs) 199 lb 11.8 oz 203 lb 7.8 oz 207 lb 14.3 oz  ?Weight (kg) 90.6 kg 92.3 kg 94.3 kg  ?   ? ?Telemetry  ?  ?Sinus brady Personally Reviewed ? ?  ? ?Physical Exam  ? ?VS:  BP (!) 139/58 (BP Location: Left Arm)   Pulse 64   Temp 97.6 ?F (36.4 ?C) (Oral)   Resp  16   Ht 5' 6.5" (1.689 m)   Wt 90.6 kg   SpO2 94%   BMI 31.76 kg/m?  , BMI Body mass index is 31.76 kg/m?. ?  ?Well developed and nourished in no acute distress ?HENT normal ?Neck supple  ?Basilar crackles  ?Regular rate and rhythm, no murmurs or gallops ?Abd-soft  ?No Clubbing cyanosis tr edema ?Skin-warm and dry ?A & Oriented  Grossly normal sensory and motor function ?  ? ?Labs  ?  ?High Sensitivity Troponin:   ?Recent Labs  ?Lab 12/08/21 ?1158 12/12/21 ?0518  ?TROPONINIHS 6 10  ? ?   ?Chemistry ?Recent Labs  ?Lab 12/08/21 ?1158 12/08/21 ?1648 12/09/21 ?0034 12/12/21 ?0518 12/13/21 ?9179 12/13/21 ?1731 12/14/21 ?1505 12/14/21 ?6979 12/15/21 ?0005  ?NA 125*  --    < > 120*   < > 115* 115* 116* 120*  ?K 4.1  --    < > 3.9   < > 3.3* 3.7 3.9 3.5  ?CL 93*  --    < > 83*   < > 80* 81*  80* 82*  ?CO2 23  --    < > 24   < > '25 24 23 29  '$ ?GLUCOSE 102*  --    < > 95   < > 116* 121* 106* 102*  ?BUN 13  --    < > 16   < > 17 32* 28* 22  ?CREATININE 0.70  --    < > 1.07*   < > 0.88 0.83 0.89 0.88  ?CALCIUM 8.9  --    < > 8.9   < > 8.0* 8.3* 8.4* 8.1*  ?MG  --  1.9  --  1.6*  --   --   --   --   --   ?PROT 6.8  --   --   --   --  6.1*  --   --   --   ?ALBUMIN 4.0  --   --   --    < > 3.1* 3.0* 3.3* 2.9*  ?AST 18  --   --   --   --  22  --   --   --   ?ALT 15  --   --   --   --  20  --   --   --   ?ALKPHOS 81  --   --   --   --  80  --   --   --   ?BILITOT 0.5  --   --   --   --  0.6  --   --   --   ?GFRNONAA >60  --    < > 50*   < > >60 >60 >60 >60  ?ANIONGAP 9  --    < > 13   < > '10 10 13 9  '$ ? < > = values in this interval not displayed.  ? ?  ?Lipids No results for input(s): CHOL, TRIG, HDL, LABVLDL, LDLCALC, CHOLHDL in the last 168 hours.  ?Hematology ?Recent Labs  ?Lab 12/09/21 ?7564 12/10/21 ?0059 12/11/21 ?3329  ?WBC 10.5 10.4 13.3*  ?RBC 4.45 4.16 4.42  ?HGB 12.3 11.4* 12.2  ?HCT 36.7 34.2* 35.5*  ?MCV 82.5 82.2 80.3  ?MCH 27.6 27.4 27.6  ?MCHC 33.5 33.3 34.4  ?RDW 13.6 13.5 13.2  ?PLT 322 318 362  ? ? ?Thyroid   ?Recent Labs  ?Lab 12/08/21 ?1648 12/09/21 ?5188  ?TSH 7.190*  --   ?FREET4  --  1.27*  ? ?  ?BNP ?Recent Labs  ?Lab 12/08/21 ?1158 12/11/21 ?1405  ?BNP 453.8* 733.6*  ? ?  ?DDimer  ?Recent Labs  ?Lab 12/08/21 ?1158  ?DDIMER 0.80*  ? ?  ? ?Radiology  ?  ?No results found. ? ?Cardiac Studies  ? ?Echo 03/04/16 ?- Left ventricle: The cavity size was normal. Wall thickness was  ?  normal. Systolic function was normal. The estimated ejection  ?  fraction was in the range of 50% to 55%.  ?- Aortic valve: Calcified non coronary cusp. There was trivial  ?  regurgitation.  ?- Mitral valve: There was mild regurgitation.  ?- Left atrium: The atrium was mildly dilated.  ?- Atrial septum: No defect or patent foramen ovale was identified.  ? ?Assessment & Plan  ?  ?  ? ?Atrial fibrillation - persistent  s/p DCCV ? ?CHF acute/chronic  ? ?Cardiomyopathy ? Mechanism  ? ?Hyponatremia -- thought 2/2 dilution in setting of HF ? SIADH component ? ?Sinus bradycardia  ? ?Hypothyroidism - iatrogenic TSH high, T4 high  T3 low  ? ? ?Continue diuretics ( per renal) ? ?Continue metop and entresto for cardiomyopathy ? ?Na improving slowly   ? ?Will decrease metoprolol with sinus brady -- can adjust again later   ? ?Continue Apixaban   ? ?For questions or updates, please contact [place] ?Please consult [email or web address] for contact info under  ? ?  ?   ?Signed, ?Virl Axe, MD  ?12/15/2021, 7:49 AM   ? ?

## 2021-12-15 NOTE — Progress Notes (Signed)
Initial Nutrition Assessment ? ?DOCUMENTATION CODES:  ?Not applicable ? ?INTERVENTION:  ?Liberalize to regular diet with 1.2L fluid restriction from dining services ?Continue Ensure Enlive po BID, each supplement provides 350 kcal and 20 grams of protein. ?MVI with minerals daily ? ?NUTRITION DIAGNOSIS:  ?Increased nutrient needs related to chronic illness (CHF) as evidenced by estimated needs. ? ?GOAL:  ?Patient will meet greater than or equal to 90% of their needs ? ?MONITOR:  ?PO intake, Weight trends, Supplement acceptance, I & O's ? ?REASON FOR ASSESSMENT:  ?Consult ?Assessment of nutrition requirement/status ? ?ASSESSMENT:  ?88 y.o. female with hx of HTN, HLD, GERD and hypothyroidism, presented to ED with worsening SOB. Pt found to be in acute CHF exacerbation and with new onset atrial fibrillation. ? ?3/8 - TEE, findings of EF of 45% ? ?Pt found to have significant fluid present on admission and despite diuresis remains with continued SOB. Also having continued issues with severe hyponatremia. Beginning to trend up this AM after nephrology encouraged PO. Currently on NaCl tabs, will liberalize diet to regular with fluid restriction to aid in PO intake. Supplements in place added by MD yesterday, will continue. ? ?No weight hx in chart to review. Noted loss this admission due to fluid losses. ? ?Noted be to slightly confused last night.  ? ?Net IO Since Admission: -9,740.48 mL [12/15/21 0929] ? ?Average Meal Intake: ?3/6-3/12: 50% intake x 6 recorded meals ? ?Nutritionally Relevant Medications: ?Scheduled Meds: ? bisacodyl  10 mg Oral Once  ? Ensure Enlive  237 mL Oral BID BM  ? levothyroxine  200 mcg Oral Q0600  ? polyethylene glycol  17 g Oral BID  ? potassium chloride  20 mEq Oral Daily  ? pravastatin  40 mg Oral q1800  ? sodium chloride  2 g Oral TID WC  ? vitamin B-1  125 mg Oral QPM  ? ?Continuous Infusions: ? sodium chloride 20 mL/hr at 12/13/21 2153  ? furosemide 66 mL/hr at 12/15/21 0654  ? ?PRN  Meds:ondansetron  ? ?Labs Reviewed: ?Sodium 120, Chloride 82 ?Mg 1.6 ?HgbA1c 4.9% (3/5)  ? ?NUTRITION - FOCUSED PHYSICAL EXAM: ?Defer to in-person assessment ? ?Diet Order:   ?Diet Order   ? ?       ?  Diet regular Room service appropriate? Yes; Fluid consistency: Thin; Fluid restriction: 1200 mL Fluid  Diet effective now       ?  ? ?  ?  ? ?  ? ? ?EDUCATION NEEDS:  ?No education needs have been identified at this time ? ?Skin:  Skin Assessment: Reviewed RN Assessment ? ?Last BM:  3/7 ? ?Height:  ?Ht Readings from Last 1 Encounters:  ?12/08/21 5' 6.5" (1.689 m)  ? ? ?Weight:  ?Wt Readings from Last 1 Encounters:  ?12/15/21 90.6 kg  ? ? ?Ideal Body Weight:  60.2 kg ? ?BMI:  Body mass index is 31.76 kg/m?. ? ?Estimated Nutritional Needs:  ?Kcal:  1600-1800 kcal/d ?Protein:  80-90 g/d ?Fluid:  1.2-1.5 L/d ? ? ?Ranell Patrick, RD, LDN ?Clinical Dietitian ?RD pager # available in [place]  ?After hours/weekend pager # available in [place] ?

## 2021-12-15 NOTE — Progress Notes (Signed)
?Progress Note ? ? ?Patient: [name] WGN:[id number] DOB: 1931/09/12 DOA: 12/08/2021     7 ?DOS: the patient was seen and examined on 12/15/2021 ?  ?Brief hospital course: ?[name] was admitted to the hospital with the working diagnosis of acute on chronic heart failure decompensation.  ? ?88 yo female with the past medical history of hypertension, dyslipidemia, and hypothyroid who presented with dyspnea. She reported dyspnea on exertion that became acutely worse at 4 am, prompting her to come to the hospital. She also reported recent worsening of cough and feeling palpitations. On her initial physical examination her blood pressure was 115/77, HR 109, RR 22 and 02 saturation 93%, heart with S1 and S2 present, irregularly irregular, with no gallops or rubs, lungs with intermittent rales and positive wheezing, abdomen soft and positive lower extremity edema.  ? ?Na 125, K 4,1 CL 93, bicarbonate 23, glucose 102, bun 13 cr 0,70 ?BNP 453 ?Wbc 8,7. Hgb 11,9 hct 36,5 Plt 323  ?D dimer 0,80 ?Sars covid 19 negative  ? ?Chest radiograph with increased lung markings bilaterally, small bilateral pleural effusions. ? ?EKG 115 bpm, normal axis, qtc 525, right bundle branch block, atrial fibrillation rhythm, no significant ST segment or T wave changes.   ? ?Patient was placed on furosemide for diuresis.  ? ?Cardiology was consulted with recommendations for cardioversion.  ?03/08 direct current cardioversion with conversion to sinus rhythm.  ? ?Patient with worsening hyponatremia, nephrology consulted.  ? ?Patient was placed on aggressive diuresis with furosemide for hypervolemia.  ? ?Slowly improving and recovering from hyponatremia.  ? ? ?Assessment and Plan: ?Acute CHF (congestive heart failure) ([place]) ?Echocardiogram with LV EF 45% with global hypokinesis, mild reduction of RV systolic function, RSVP 78,4. Moderate mitral regurgitation. Mild dilatation of ascending aorta.  ? ?Urine output is 2,626 cc over last 24 hrs  with improvement in her symptoms.  ?Systolic blood pressure 696 to 130 mmHg  ?Warm lower extremities.  ? ?Edema continue to improve.  ?Continue diuresis. ?Heart failure management with entresto and metoprolol.  ? ?Acute hypoxemic respiratory failure, continue with supplemental 02 per [place], diuresis for cardiogenic pulmonary edema. ?Continue with inhaled corticosteroids, will need outpatient pulmonary function testing.  ? ?Atrial fibrillation with RVR ([place]) ?SP cardioversion recovered sinus rhythm. ?Continue with metoprolol and anticoagulation with apixaban.  ?  ? ?  ? ?Essential hypertension ?Continue with entresto and metoprolol.  ?Continue close blood pressure monitoring.  ? ?Hyponatremia ?Hypokalemia/ hypomagnesemia.  ? ?Improved volume status, Na is up to 120 today with K at 3,5 and serum bicarbonate at 29, ?Cr is 0,88 ? ?Continue diuresis with furosemide drip, and follow up renal function in am.  ? ?Continue nutritional support to increase urine solute.  ?Ensure.  ? ?Normocytic anemia ?Cell count has been stable ?Reactive leukocytosis 13.3 on 03/8  ? ?Hypothyroidism ?Patient with high TSH along with low free T4 and free T3. ?On levothyroxine and follow up thyroid function test in 3 weeks as outpatient.  ? ? ?Class 1 obesity ?Calculated BMI is 33,0 ?Plan to encourage mobility, out of bed to chair tid with meals. ?Pt and Ot.  ? ?SIRS (systemic inflammatory response syndrome) (H[facility])-resolved as of 12/11/2021 ?- Clinically do not suspect pneumonia, chest x-ray findings are suggestive of CHF and atelectasis ?-Afebrile, no leukocytosis, lactate and procalcitonin are normal ?-discontinued antibiotics yesterday ? ? ? ? ?  ? ?Subjective: Patient is feeling better, she had transitory confusion this morning. Tolerating po well and she has been out of  bed to the chair.  ? ?Physical Exam: ?Vitals:  ? 12/15/21 0313 12/15/21 3149 12/15/21 0721 12/15/21 0726  ?BP: (!) 118/54  (!) 139/58   ?Pulse: (!) 57  (!) 57 64  ?Resp: '20  18 16   '$ ?Temp: 98.1 ?F (36.7 ?C)  97.6 ?F (36.4 ?C)   ?TempSrc: Oral  Oral   ?SpO2: 95%  93% 94%  ?Weight:  90.6 kg    ?Height:      ? ?Neurology awake and alert ?ENT with no pallor or icterus ?Cardiovascular with S1 and S2 present and rhythmic with no gallops or rubs, no murmurs ?No JVD ?Trace lower extremity edema ?Respiratory with intermittent wheezing but no rales or rhonchi ?Abdomen soft and not distended  ?Data Reviewed: ? ? ? ?Family Communication: no family at the bedside  ? ?Disposition: ?Status is: Inpatient ?Remains inpatient appropriate because: hyponatremia  ? Planned Discharge Destination: Home ? ? ? ?Author: ?Tawni Millers, MD ?12/15/2021 9:38 AM ? ?For on call review [email or web address].  ?

## 2021-12-15 NOTE — Plan of Care (Signed)
?  Problem: Education: ?Goal: Knowledge of General Education information will improve ?Description: Including pain rating scale, medication(s)/side effects and non-pharmacologic comfort measures ?Outcome: Progressing ?  ?Problem: Clinical Measurements: ?Goal: Will remain free from infection ?Outcome: Progressing ?Goal: Diagnostic test results will improve ?Outcome: Progressing ?  ?Problem: Nutrition: ?Goal: Adequate nutrition will be maintained ?Outcome: Progressing ?  ?Problem: Pain Managment: ?Goal: General experience of comfort will improve ?Outcome: Progressing ?  ?

## 2021-12-15 NOTE — Progress Notes (Signed)
[facility] Renal Progress Note  Requesting MD:  Mauricio Arrien Indication for Consultation: Hyponatremia   HPI:  [name] is a 88 y.o. female with phmx of HTN, HLD, hypothroid who presented to the ED on 3/5 for dyspnea and admitted for acute on chronic HF and afib w/ RVR (cardioverted 3/8). TEE EF of 45%. Nephrology was consulted d/t the patient's ongoing hyponatremia.     Cortisol WNL. Urine Sodium 51. Urine osm 298. (Labs drawn while patient was on loop diuretic)  Urine output 2.7 L 12/14/2020 weight down to 90.6 kg  Blood pressure 139/58 pulse 53 temperature 97.6 O2 sats 96% 2 L nasal cannula  Sodium 120 potassium 3.5 chloride 82 CO2 29 BUN 22 creatinine 0.88 glucose 102  calcium 8.1 phosphorus 3.8 albumin 2.9     Creatinine, Ser  Date/Time Value Ref Range Status  12/15/2021 12:05 AM 0.88 0.44 - 1.00 mg/dL Final  12/14/2021 06:16 AM 0.89 0.44 - 1.00 mg/dL Final  12/14/2021 12:42 AM 0.83 0.44 - 1.00 mg/dL Final  12/13/2021 05:31 PM 0.88 0.44 - 1.00 mg/dL Final  12/13/2021 11:03 AM 0.87 0.44 - 1.00 mg/dL Final  12/13/2021 12:58 AM 0.94 0.44 - 1.00 mg/dL Final  12/12/2021 05:18 AM 1.07 (H) 0.44 - 1.00 mg/dL Final  12/12/2021 12:46 AM 1.03 (H) 0.44 - 1.00 mg/dL Final  12/11/2021 12:51 AM 1.05 (H) 0.44 - 1.00 mg/dL Final  12/10/2021 12:59 AM 0.87 0.44 - 1.00 mg/dL Final  12/09/2021 05:58 AM 0.86 0.44 - 1.00 mg/dL Final  12/08/2021 11:58 AM 0.70 0.44 - 1.00 mg/dL Final     PMHx:   Past Medical History:  Diagnosis Date   Dyspnea    diastolic dysfunction by echo 2012   GERD (gastroesophageal reflux disease)    Hypertension    Mild mitral and aortic regurgitation    Osteoarthritis    Right bundle branch block 2014   Thyroid disease     Past Surgical History:  Procedure Laterality Date   arm surgery     following an accident   CARDIOVERSION N/A 12/11/2021   Procedure: CARDIOVERSION;  Surgeon: Werner Lean, MD;  Location: [facility] ENDOSCOPY;   Service: Cardiovascular;  Laterality: N/A;   KNEE SURGERY     Following an accident   TEE WITHOUT CARDIOVERSION N/A 12/11/2021   Procedure: TRANSESOPHAGEAL ECHOCARDIOGRAM (TEE);  Surgeon: Werner Lean, MD;  Location: [facility] ENDOSCOPY;  Service: Cardiovascular;  Laterality: N/A;   TONSILLECTOMY AND ADENOIDECTOMY     VAGINAL HYSTERECTOMY      Family Hx:  Family History  Problem Relation Age of Onset   Heart attack Mother     Social History:  reports that she has never smoked. She has never used smokeless tobacco. She reports that she does not drink alcohol and does not use drugs.  Allergies: No Known Allergies  Medications: Prior to Admission medications   Medication Sig Start Date End Date Taking? Authorizing Provider  amLODipine (NORVASC) 2.5 MG tablet Take 2.5 mg by mouth daily.   Yes [provider]  aspirin 81 MG tablet Take 81 mg by mouth daily.   Yes [provider]  Calcium Carbonate-Vitamin D 500-125 MG-UNIT TABS Take 1 tablet by mouth every evening.   Yes [provider]  Cholecalciferol (VITAMIN D) 50 MCG (2000 UT) CAPS Take 2,000 Units by mouth daily. 03/26/21  Yes [provider]  FLUoxetine (PROZAC) 40 MG capsule Take 40 mg by mouth every morning.   Yes [provider]  fluticasone (  FLONASE) 50 MCG/ACT nasal spray Place 1 spray into both nostrils daily as needed for allergies or rhinitis.   Yes [provider]  levothyroxine (SYNTHROID, LEVOTHROID) 150 MCG tablet Take 150-225 mcg by mouth See admin instructions. Takes 150 mg on Thursday, Friday and Saturday and 225 mg on all other days.   Yes [provider]  LORazepam (ATIVAN) 0.5 MG tablet Take 0.5 mg by mouth daily as needed for anxiety. 03/18/21  Yes [provider]  lovastatin (MEVACOR) 40 MG tablet Take 60 mg by mouth at bedtime.   Yes [provider]  Multiple Vitamin (MULTIVITAMIN WITH MINERALS) TABS tablet Take 1 tablet by mouth  every evening.   Yes [provider]  Multiple Vitamins-Minerals ([place]) CAPS Take 1 capsule by mouth every evening.   Yes [provider]  naproxen (NAPROSYN) 500 MG tablet Take 500 mg by mouth daily as needed for moderate pain. 07/24/21  Yes [provider]  Omega-3 Fatty Acids (FISH OIL) 1200 MG CAPS Take by mouth.   Yes [provider]  omeprazole (PRILOSEC) 10 MG capsule Take 10 mg by mouth every evening. 10/16/21  Yes [provider]  Thiamine HCl (VITAMIN B-1) 250 MG tablet Take 125 mg by mouth every evening.   Yes [provider]  vitamin B-12 (CYANOCOBALAMIN) 1000 MCG tablet Take 1,000 mcg by mouth daily.   Yes [provider]    I have reviewed the patient's current medications.  Labs:  Results for orders placed or performed during the hospital encounter of 12/08/21 (from the past 48 hour(s))  Renal function panel     Status: Abnormal   Collection Time: 12/13/21 11:03 AM  Result Value Ref Range   Sodium 113 (LL) 135 - 145 mmol/L    Comment: CRITICAL RESULT CALLED TO, READ BACK BY AND VERIFIED WITH: SICKLES,K RN @ 1215 12/13/21 LEONARD,A    Potassium 3.8 3.5 - 5.1 mmol/L   Chloride 81 (L) 98 - 111 mmol/L   CO2 22 22 - 32 mmol/L   Glucose, Bld 113 (H) 70 - 99 mg/dL    Comment: Glucose reference range applies only to samples taken after fasting for at least 8 hours.   BUN 17 8 - 23 mg/dL   Creatinine, Ser 0.87 0.44 - 1.00 mg/dL   Calcium 8.1 (L) 8.9 - 10.3 mg/dL   Phosphorus 3.4 2.5 - 4.6 mg/dL   Albumin 3.1 (L) 3.5 - 5.0 g/dL   GFR, Estimated >60 >60 mL/min    Comment: (NOTE) Calculated using the CKD-EPI Creatinine Equation (2021)    Anion gap 10 5 - 15    Comment: Performed at [facility] [address] [place],  [address]  Comprehensive metabolic panel     Status: Abnormal   Collection Time: 12/13/21  5:31 PM  Result Value Ref Range   Sodium 115 (LL) 135 - 145 mmol/L     Comment: CRITICAL RESULT CALLED TO, READ BACK BY AND VERIFIED WITH: E [facility] RN 1855 12/13/2021 BY R VERAAR    Potassium 3.3 (L) 3.5 - 5.1 mmol/L   Chloride 80 (L) 98 - 111 mmol/L   CO2 25 22 - 32 mmol/L   Glucose, Bld 116 (H) 70 - 99 mg/dL    Comment: Glucose reference range applies only to samples taken after fasting for at least 8 hours.   BUN 17 8 - 23 mg/dL   Creatinine, Ser 0.88 0.44 - 1.00 mg/dL   Calcium 8.0 (  L) 8.9 - 10.3 mg/dL   Total Protein 6.1 (L) 6.5 - 8.1 g/dL   Albumin 3.1 (L) 3.5 - 5.0 g/dL   AST 22 15 - 41 U/L   ALT 20 0 - 44 U/L   Alkaline Phosphatase 80 38 - 126 U/L   Total Bilirubin 0.6 0.3 - 1.2 mg/dL   GFR, Estimated >60 >60 mL/min    Comment: (NOTE) Calculated using the CKD-EPI Creatinine Equation (2021)    Anion gap 10 5 - 15    Comment: Performed at [facility] [address], [place], [place] [address]  Phosphorus     Status: None   Collection Time: 12/13/21  6:35 PM  Result Value Ref Range   Phosphorus 3.5 2.5 - 4.6 mg/dL    Comment: Performed at [facility], [place] [address] [place], [place] [address]  Renal function panel     Status: Abnormal   Collection Time: 12/14/21 12:42 AM  Result Value Ref Range   Sodium 115 (LL) 135 - 145 mmol/L    Comment: CRITICAL RESULT CALLED TO, READ BACK BY AND VERIFIED WITH: MCKINNEY C,RN 12/14/21 0155 WAYK    Potassium 3.7 3.5 - 5.1 mmol/L   Chloride 81 (L) 98 - 111 mmol/L   CO2 24 22 - 32 mmol/L   Glucose, Bld 121 (H) 70 - 99 mg/dL    Comment: Glucose reference range applies only to samples taken after fasting for at least 8 hours.   BUN 32 (H) 8 - 23 mg/dL   Creatinine, Ser 0.83 0.44 - 1.00 mg/dL   Calcium 8.3 (L) 8.9 - 10.3 mg/dL   Phosphorus 3.8 2.5 - 4.6 mg/dL   Albumin 3.0 (L) 3.5 - 5.0 g/dL   GFR, Estimated >60 >60 mL/min    Comment: (NOTE) Calculated using the CKD-EPI Creatinine Equation (2021)    Anion gap 10 5 - 15    Comment: Performed at [facility] [address],  [place], [place] [address]  Renal function panel     Status: Abnormal   Collection Time: 12/14/21  6:16 AM  Result Value Ref Range   Sodium 116 (LL) 135 - 145 mmol/L    Comment: CRITICAL RESULT CALLED TO, READ BACK BY AND VERIFIED WITH: MCKINNEY C,RN 12/14/21 0652 WAYK    Potassium 3.9 3.5 - 5.1 mmol/L   Chloride 80 (L) 98 - 111 mmol/L   CO2 23 22 - 32 mmol/L   Glucose, Bld 106 (H) 70 - 99 mg/dL    Comment: Glucose reference range applies only to samples taken after fasting for at least 8 hours.   BUN 28 (H) 8 - 23 mg/dL   Creatinine, Ser 0.89 0.44 - 1.00 mg/dL   Calcium 8.4 (L) 8.9 - 10.3 mg/dL   Phosphorus 4.1 2.5 - 4.6 mg/dL   Albumin 3.3 (L) 3.5 - 5.0 g/dL   GFR, Estimated >60 >60 mL/min    Comment: (NOTE) Calculated using the CKD-EPI Creatinine Equation (2021)    Anion gap 13 5 - 15    Comment: Performed at [facility] [address], [place], [place] [address]  Renal function panel     Status: Abnormal   Collection Time: 12/15/21 12:05 AM  Result Value Ref Range   Sodium 120 (L) 135 - 145 mmol/L   Potassium 3.5 3.5 - 5.1 mmol/L   Chloride 82 (L) 98 - 111 mmol/L   CO2 29 22 - 32 mmol/L   Glucose, Bld 102 (  H) 70 - 99 mg/dL    Comment: Glucose reference range applies only to samples taken after fasting for at least 8 hours.   BUN 22 8 - 23 mg/dL   Creatinine, Ser 0.88 0.44 - 1.00 mg/dL   Calcium 8.1 (L) 8.9 - 10.3 mg/dL   Phosphorus 3.8 2.5 - 4.6 mg/dL   Albumin 2.9 (L) 3.5 - 5.0 g/dL   GFR, Estimated >60 >60 mL/min    Comment: (NOTE) Calculated using the CKD-EPI Creatinine Equation (2021)    Anion gap 9 5 - 15    Comment: Performed at [facility] [address] [place], [place] [address]    ROS:  Pertinent items noted in HPI and remainder of comprehensive ROS otherwise negative.  Physical Exam: Vitals:   12/15/21 0721 12/15/21 0726  BP: (!) 139/58   Pulse: (!) 57 64  Resp: 18 16  Temp: 97.6 F (36.4 C)   SpO2: 93% 94%     General exam:  Elderly pleasant female sitting up in bed, AAOx3 Neuro: Strength intact. No focal deficits.   HEENT: EOMI. PERRLA Positive JVD CVS: S1-S2, regular rhythm Lungs: Fine basilar Rales, few wheezes noted Abdomen: Soft, nontender, bowel sounds present Extremities: 1+ edema Skin: No rashes Psychiatry: Judgement and insight appear normal. Mood & affect appropriate.  Assessment/Plan: 1.Hyponatremia likely hypervolemic in setting of HF, worsening  -Complex situation multifactorial.  May have an element of SIADH.  Urine specific gravity appears maximally dilute.  She makes me think this could be poor solute intake.  I therefore maximized her solute intake during the day and encouraged her to reduce her liquid intake.  She has eaten very little breakfast and is drinking her coffee at this time.  Urine output is good with very high doses of loop diuretics.  Sodium appears to be increasing.  We shall continue to monitor her sodium during the course of the day.    2. Hypertension/volume   -Hypervolemic; she continues on IV Lasix 160 mg every 8 hours.   Sherril Croon 12/15/2021, 9:00 AM   Sherril Croon 12/15/2021, 9:00 AM

## 2021-12-16 ENCOUNTER — Encounter (HOSPITAL_COMMUNITY): Payer: Self-pay | Admitting: Internal Medicine

## 2021-12-16 DIAGNOSIS — I5031 Acute diastolic (congestive) heart failure: Secondary | ICD-10-CM | POA: Diagnosis not present

## 2021-12-16 LAB — RENAL FUNCTION PANEL
Albumin: 2.8 g/dL — ABNORMAL LOW (ref 3.5–5.0)
Anion gap: 12 (ref 5–15)
BUN: 20 mg/dL (ref 8–23)
CO2: 29 mmol/L (ref 22–32)
Calcium: 8.3 mg/dL — ABNORMAL LOW (ref 8.9–10.3)
Chloride: 83 mmol/L — ABNORMAL LOW (ref 98–111)
Creatinine, Ser: 0.97 mg/dL (ref 0.44–1.00)
GFR, Estimated: 56 mL/min — ABNORMAL LOW (ref >60)
Glucose, Bld: 106 mg/dL — ABNORMAL HIGH (ref 70–99)
Phosphorus: 3.4 mg/dL (ref 2.5–4.6)
Potassium: 3.5 mmol/L (ref 3.5–5.1)
Sodium: 124 mmol/L — ABNORMAL LOW (ref 135–145)

## 2021-12-16 MED ORDER — TORSEMIDE 20 MG PO TABS
100.0000 mg | ORAL_TABLET | Freq: Two times a day (BID) | ORAL | Status: DC
  Administered 2021-12-16 – 2021-12-17 (×3): 100 mg via ORAL
  Filled 2021-12-16 (×3): qty 5

## 2021-12-16 NOTE — Progress Notes (Signed)
Heart Failure Nurse Navigator Progress Note ? ?Interviewed patient to assess for HV TOC clinic. Pt resting in bed, on 3LPM via [place]. VSS. No immediate social needs noted. Has daughter in [place] that aid with care and transportation. Explained benefits of Heart & Vascular Transitions of Care Clinic appointment, patient agreeable.  ? ?Scheduled HV TOC clinic appt for Monday, 3/27 @ 10AM.  ? ?Pricilla Holm, MSN, RN ?Heart Failure Nurse Navigator ?[phone] ? ? ? ?

## 2021-12-16 NOTE — Progress Notes (Signed)
Physical Therapy Treatment ?Patient Details ?Name: [name] ?MRN: [id number] ?DOB: 07-Oct-1930 ?Today's Date: 12/16/2021 ? ? ?History of Present Illness Patient is a 88 y/o female who presents on 12/08/21 with SOB. Found to have new onset A-fib and acute respiratory distress secondary to CHF exacerbation and SIRS. CXR-bilateral interstitial thickening concerning for edema versus pneumonia in the left lower lobe. S/p cardioversion 3/8. PMH includes HTN and RBBB., mild AR/MR. ? ?  ?PT Comments  ? ? As PT entered room, pt standing with OT in front of bedside commode. OT cued pt to "head to the chair," however, pt initiated sitting back onto [facility] which was no longer behind her. PT was present at this time to assist OT with controlled lower to ground, with pt in long sitting position. Performed hoyer lift transfer from floor to bed. Pt requiring minA to return lower extremities back into bed. Mobility note to follow. RN/MD notified.  ?  ?Recommendations for follow up therapy are one component of a multi-disciplinary discharge planning process, led by the attending physician.  Recommendations may be updated based on patient status, additional functional criteria and insurance authorization. ? ?Follow Up Recommendations ? Home health PT ?  ?  ?Assistance Recommended at Discharge Intermittent Supervision/Assistance  ?Patient can return home with the following Help with stairs or ramp for entrance;A little help with walking and/or transfers;A little help with bathing/dressing/bathroom;Assistance with cooking/housework ?  ?Equipment Recommendations ? None recommended by PT  ?  ?Recommendations for Other Services   ? ? ?  ?Precautions / Restrictions Precautions ?Precautions: Fall;Other (comment) ?Precaution Comments: watch HR/02; urinary incontinence (lasix); hyponatremia ?Restrictions ?Weight Bearing Restrictions: No  ?  ? ?Mobility ? Bed Mobility ?Overal bed mobility: Needs Assistance ?Bed Mobility: Sit to Supine ?  ?  ?   ?Sit to supine: Min assist ?  ?General bed mobility comments: MinA for LE assist back into bed ?  ? ?Transfers ?  ?  ?  ?  ?  ?  ?  ?  ?  ?  ?  ? ?Ambulation/Gait ?  ?  ?  ?  ?  ?  ?  ?  ? ? ?Stairs ?  ?  ?  ?  ?  ? ? ?Wheelchair Mobility ?  ? ?Modified Rankin (Stroke Patients Only) ?  ? ? ?  ?Balance Overall balance assessment: Needs assistance ?Sitting-balance support: Feet supported, No upper extremity supported ?Sitting balance-Leahy Scale: Good ?  ?  ?Standing balance support: During functional activity, Bilateral upper extremity supported ?Standing balance-Leahy Scale: Poor ?  ?  ?  ?  ?  ?  ?  ?  ?  ?  ?  ?  ?  ? ?  ?Cognition Arousal/Alertness: Awake/alert ?Behavior During Therapy: [facility] for tasks assessed/performed ?Overall Cognitive Status: Within Functional Limits for tasks assessed ?  ?  ?  ?  ?  ?  ?  ?  ?  ?  ?  ?  ?  ?  ?  ?  ?  ?  ?  ? ?  ?Exercises   ? ?  ?General Comments General comments (skin integrity, edema, etc.): On 3L O2 throughout session. ?  ?  ? ?Pertinent Vitals/Pain Pain Assessment ?Pain Assessment: Faces ?Faces Pain Scale: Hurts a little bit ?Pain Location: R knee ?Pain Descriptors / Indicators: Aching ?Pain Intervention(s): Monitored during session  ? ? ?Home Living   ?  ?  ?  ?  ?  ?  ?  ?  ?  ?   ?  ?  Prior Function    ?  ?  ?   ? ?PT Goals (current goals can now be found in the care plan section) Acute Rehab PT Goals ?Patient Stated Goal: to go home ?Potential to Achieve Goals: Good ? ?  ?Frequency ? ? ? Min 3X/week ? ? ? ?  ?PT Plan Current plan remains appropriate  ? ? ?Co-evaluation PT/OT/SLP Co-Evaluation/Treatment: Yes ?Reason for Co-Treatment: Other (comment) (assisting pt post fall) ?  ?  ?  ? ?  ?AM-PAC PT "6 Clicks" Mobility   ?Outcome Measure ? Help needed turning from your back to your side while in a flat bed without using bedrails?: A Little ?Help needed moving from lying on your back to sitting on the side of a flat bed without using bedrails?: A Little ?Help needed  moving to and from a bed to a chair (including a wheelchair)?: A Little ?Help needed standing up from a chair using your arms (e.g., wheelchair or bedside chair)?: A Little ?Help needed to walk in hospital room?: A Little ?Help needed climbing 3-5 steps with a railing? : A Lot ?6 Click Score: 17 ? ?  ?End of Session Equipment Utilized During Treatment: Oxygen;Gait belt ?  ?Patient left: in bed;with call bell/phone within reach;with family/visitor present ?Nurse Communication: Mobility status ?PT Visit Diagnosis: Muscle weakness (generalized) (M62.81);Difficulty in walking, not elsewhere classified (R26.2) ?  ? ? ?Time: 4098-1191 ?PT Time Calculation (min) (ACUTE ONLY): 24 min ? ?Charges:  $Therapeutic Activity: 8-22 mins          ?          ? ?[name], PT, DPT ?Acute Rehabilitation Services ?Pager [phone] ?Office [phone] ? ? ? ?[name] ?12/16/2021, 4:01 PM ? ?

## 2021-12-16 NOTE — Progress Notes (Signed)
? ?Progress Note ? ?Patient Name: [name] ?Date of Encounter: 12/16/2021 ? ?[facility] Cardiologist: Oval Linsey ? ?Patient Profile  ?   ?88 y.o. female with hypertension, hyperlipidemia, and newly diagnosed atrial fibrillation >> TEE/DCCV.  12/11/21 Echo   EF 45 %  --MR mod  On anticoagulation with Apixaban   ? ? ? ?Subjective  ? ?Feels well eating breakfast  ? ?Inpatient Medications  ?  ?Scheduled Meds: ? apixaban  5 mg Oral BID  ? feeding supplement  237 mL Oral BID BM  ? levothyroxine  200 mcg Oral Q0600  ? mouth rinse  15 mL Mouth Rinse BID  ? metoprolol succinate  50 mg Oral Daily  ? mometasone-formoterol  2 puff Inhalation BID  ? polyethylene glycol  17 g Oral BID  ? potassium chloride  20 mEq Oral Daily  ? pravastatin  40 mg Oral q1800  ? sacubitril-valsartan  1 tablet Oral BID  ? senna-docusate  2 tablet Oral BID  ? sodium chloride flush  3 mL Intravenous Q12H  ? vitamin B-1  125 mg Oral QPM  ? torsemide  100 mg Oral BID  ? ?Continuous Infusions: ? sodium chloride 20 mL/hr at 12/13/21 2153  ? ?PRN Meds: ?sodium chloride, acetaminophen, albuterol, fluticasone, LORazepam, ondansetron (ZOFRAN) IV, sodium chloride flush  ? ?Vital Signs  ?  ?Vitals:  ? 12/15/21 2337 12/16/21 0453 12/16/21 0803 12/16/21 0843  ?BP: (!) 131/97 (!) 108/58 (!) 122/50   ?Pulse: 64 (!) 57 62   ?Resp: '20 17 17   '$ ?Temp: 98.4 ?F (36.9 ?C) 97.6 ?F (36.4 ?C) 98 ?F (36.7 ?C)   ?TempSrc: Oral Oral Oral   ?SpO2: 94% 94% 97% 97%  ?Weight:  89.9 kg    ?Height:      ? ? ?Intake/Output Summary (Last 24 hours) at 12/16/2021 0930 ?Last data filed at 12/16/2021 0300 ?Gross per 24 hour  ?Intake 1660.33 ml  ?Output 1025 ml  ?Net 635.33 ml  ? ?Last 3 Weights 12/16/2021 12/15/2021 12/14/2021  ?Weight (lbs) 198 lb 3.1 oz 199 lb 11.8 oz 203 lb 7.8 oz  ?Weight (kg) 89.9 kg 90.6 kg 92.3 kg  ?   ? ?Telemetry  ?  ?Sinus brady Personally Reviewed ? ?  ? ?Physical Exam  ? ?VS:  BP (!) 122/50 (BP Location: Right Arm)   Pulse 62   Temp 98 ?F (36.7 ?C) (Oral)    Resp 17   Ht 5' 6.5" (1.689 m)   Wt 89.9 kg   SpO2 97%   BMI 31.51 kg/m?  , BMI Body mass index is 31.51 kg/m?. ?  ?Well developed and nourished in no acute distress ?HENT normal ?Neck supple  ?Basilar crackles  ?Regular rate and rhythm, no murmurs or gallops ?Abd-soft  ?No Clubbing cyanosis tr edema ?Skin-warm and dry ?A & Oriented  Grossly normal sensory and motor function ?  ? ?Labs  ?  ?High Sensitivity Troponin:   ?Recent Labs  ?Lab 12/08/21 ?1158 12/12/21 ?0518  ?TROPONINIHS 6 10  ?   ?Chemistry ?Recent Labs  ?Lab 12/12/21 ?0518 12/13/21 ?9024 12/13/21 ?1731 12/14/21 ?0973 12/14/21 ?5329 12/15/21 ?0005 12/16/21 ?0041  ?NA 120*   < > 115*   < > 116* 120* 124*  ?K 3.9   < > 3.3*   < > 3.9 3.5 3.5  ?CL 83*   < > 80*   < > 80* 82* 83*  ?CO2 24   < > 25   < > '23 29 29  '$ ?  GLUCOSE 95   < > 116*   < > 106* 102* 106*  ?BUN 16   < > 17   < > 28* 22 20  ?CREATININE 1.07*   < > 0.88   < > 0.89 0.88 0.97  ?CALCIUM 8.9   < > 8.0*   < > 8.4* 8.1* 8.3*  ?MG 1.6*  --   --   --   --   --   --   ?PROT  --   --  6.1*  --   --   --   --   ?ALBUMIN  --    < > 3.1*   < > 3.3* 2.9* 2.8*  ?AST  --   --  22  --   --   --   --   ?ALT  --   --  20  --   --   --   --   ?ALKPHOS  --   --  54  --   --   --   --   ?BILITOT  --   --  0.6  --   --   --   --   ?GFRNONAA 50*   < > >60   < > >60 >60 56*  ?ANIONGAP 13   < > 10   < > '13 9 12  '$ ? < > = values in this interval not displayed.  ?  ?Lipids No results for input(s): CHOL, TRIG, HDL, LABVLDL, LDLCALC, CHOLHDL in the last 168 hours.  ?Hematology ?Recent Labs  ?Lab 12/10/21 ?0059 12/11/21 ?9833  ?WBC 10.4 13.3*  ?RBC 4.16 4.42  ?HGB 11.4* 12.2  ?HCT 34.2* 35.5*  ?MCV 82.2 80.3  ?MCH 27.4 27.6  ?MCHC 33.3 34.4  ?RDW 13.5 13.2  ?PLT 318 362  ? ?Thyroid  ?No results for input(s): TSH, FREET4 in the last 168 hours.  ?BNP ?Recent Labs  ?Lab 12/11/21 ?1405  ?BNP 733.6*  ?  ?DDimer  ?No results for input(s): DDIMER in the last 168 hours.  ? ?Radiology  ?  ?No results found. ? ?Cardiac Studies   ? ?Echo 03/04/16 ?- Left ventricle: The cavity size was normal. Wall thickness was  ?  normal. Systolic function was normal. The estimated ejection  ?  fraction was in the range of 50% to 55%.  ?- Aortic valve: Calcified non coronary cusp. There was trivial  ?  regurgitation.  ?- Mitral valve: There was mild regurgitation.  ?- Left atrium: The atrium was mildly dilated.  ?- Atrial septum: No defect or patent foramen ovale was identified.  ? ?Assessment & Plan  ?  ?  ? ?Atrial fibrillation - persistent  s/p DCCV maintaining NSR continue Toprol and eliquis  ? ?CHF acute/chronic continue entresto and demadex  ? ?Cardiomyopathy  no plans for cath see above ? Rate related from rapid afib  ? ?Hyponatremia -- thought 2/2 dilution in setting of HF ? SIADH component improving slowly 124  ? ?Sinus bradycardia Toprol dose decreased 12/15/21 by Dr Caryl Comes  ? ?Hypothyroidism - iatrogenic TSH high, T4 high T3 low  ? ? ?For questions or updates, please contact [place] ?Please consult [email or web address] for contact info under  ? ?  ?   ?Signed, ?Jenkins Rouge, MD  ?12/16/2021, 9:30 AM   ? ?

## 2021-12-16 NOTE — Progress Notes (Signed)
Occupational Therapy Treatment ?Patient Details ?Name: [name] ?MRN: [id number] ?DOB: January 10, 1931 ?Today's Date: 12/16/2021 ? ? ?History of present illness Patient is a 88 y/o female who presents on 12/08/21 with SOB. Found to have new onset A-fib and acute respiratory distress secondary to CHF exacerbation and SIRS. CXR-bilateral interstitial thickening concerning for edema versus pneumonia in the left lower lobe. S/p cardioversion 3/8. PMH includes HTN and RBBB., mild AR/MR. ?  ?OT comments ? Pt progressing slow but steady towards acute OT goals. Up to min A with functional transfers this session, mod A for pericare in standing. Of note, pt with assisted/controlled fall at end of OT session, lowered to floor with +2 assist, Harrel Lemon utilized for back to bed. Pt had finished pericare in standing and was cued to head to the chair, meaning the recliner which was at a 90* angle from her. Pt quickly initiated sitting back onto [facility] which had been moved to the side to clear the path to the recliner. No apparent injuries, no acute distress, pt in good spirits. Nursing and on-call MD notified. D/c plan remains appropriate.  ? ?Recommendations for follow up therapy are one component of a multi-disciplinary discharge planning process, led by the attending physician.  Recommendations may be updated based on patient status, additional functional criteria and insurance authorization. ?   ?Follow Up Recommendations ? Home health OT  ?  ?Assistance Recommended at Discharge Intermittent Supervision/Assistance  ?Patient can return home with the following ? A little help with walking and/or transfers;A little help with bathing/dressing/bathroom;Assistance with cooking/housework ?  ?Equipment Recommendations ? None recommended by OT  ?  ?Recommendations for Other Services   ? ?  ?Precautions / Restrictions Precautions ?Precautions: Fall;Other (comment) ?Precaution Comments: watch HR/02; urinary incontinence (lasix);  hyponatremia ?Restrictions ?Weight Bearing Restrictions: No  ? ? ?  ? ?Mobility Bed Mobility ?Overal bed mobility: Needs Assistance ?Bed Mobility: Supine to Sit ?  ?  ?Supine to sit: Min assist ?  ?  ?General bed mobility comments: extra time and effort, cues for sequencing. assist for truncal support ?  ? ?Transfers ?Overall transfer level: Needs assistance ?Equipment used: Rolling walker (2 wheels) ?Transfers: Sit to/from Stand ?Sit to Stand: Min assist, From elevated surface ?  ?  ?  ?  ?  ?General transfer comment: to/from EOB and BSC. cues for technique with rw, assist to steady. extra time and effort ?  ?  ?Balance Overall balance assessment: Needs assistance ?Sitting-balance support: Feet supported, No upper extremity supported ?Sitting balance-Leahy Scale: Good ?  ?  ?Standing balance support: During functional activity, Bilateral upper extremity supported ?Standing balance-Leahy Scale: Poor ?  ?  ?  ?  ?  ?  ?  ?  ?  ?  ?  ?  ?   ? ?ADL either performed or assessed with clinical judgement  ? ?ADL Overall ADL's : Needs assistance/impaired ?  ?  ?  ?  ?  ?  ?  ?  ?  ?  ?  ?  ?Toilet Transfer: Minimal assistance;Stand-pivot;Ambulation;BSC/3in1;Rolling walker (2 wheels) ?Toilet Transfer Details (indicate cue type and reason): min A to steady, cues for sequencing and technique with rw ?Toileting- Clothing Manipulation and Hygiene: Moderate assistance;Sit to/from stand ?Toileting - Clothing Manipulation Details (indicate cue type and reason): Pt needed BUE in static standing. total A for pericare in standing. ?  ?  ?Functional mobility during ADLs: Min guard;Rolling walker (2 wheels) ?General ADL Comments: Pt took pivotal steps to access [facility]  from EOB. Pericare as detailed above. ?  ? ?Extremity/Trunk Assessment Upper Extremity Assessment ?Upper Extremity Assessment: Generalized weakness ?  ?Lower Extremity Assessment ?Lower Extremity Assessment: Defer to PT evaluation ?  ?  ?  ? ?Vision   ?  ?  ?Perception   ?   ?Praxis   ?  ? ?Cognition Arousal/Alertness: Awake/alert ?Behavior During Therapy: [facility] for tasks assessed/performed ?Overall Cognitive Status: Within Functional Limits for tasks assessed ?  ?  ?  ?  ?  ?  ?  ?  ?  ?  ?  ?  ?  ?  ?  ?  ?General Comments: pt somewhat slow processing and mild STM deficit ?  ?  ?   ?Exercises   ? ?  ?Shoulder Instructions   ? ? ?  ?General Comments On 3L O2 throughout session.  ? ? ?Pertinent Vitals/ Pain       Pain Assessment ?Pain Assessment: Faces ?Faces Pain Scale: Hurts a little bit ?Pain Location: R knee ?Pain Descriptors / Indicators: Aching ?Pain Intervention(s): Monitored during session ? ?Home Living   ?  ?  ?  ?  ?  ?  ?  ?  ?  ?  ?  ?  ?  ?  ?  ?  ?  ?  ? ?  ?Prior Functioning/Environment    ?  ?  ?  ?   ? ?Frequency ? Min 2X/week  ? ? ? ? ?  ?Progress Toward Goals ? ?OT Goals(current goals can now be found in the care plan section) ? Progress towards OT goals: Progressing toward goals ? ?Acute Rehab OT Goals ?Patient Stated Goal: home, independence with ADLs ?OT Goal Formulation: With patient ?Time For Goal Achievement: 12/26/21 ?Potential to Achieve Goals: Good ?ADL Goals ?Pt Will Perform Grooming: with modified independence;standing ?Pt Will Perform Lower Body Bathing: with modified independence;sit to/from stand ?Pt Will Perform Lower Body Dressing: with modified independence;sit to/from stand ?Pt Will Perform Tub/Shower Transfer: with modified independence;Shower transfer;ambulating  ?Plan Discharge plan remains appropriate   ? ?Co-evaluation ? ? ?   ?  ?  ?  ?  ? ?  ?AM-PAC OT "6 Clicks" Daily Activity     ?Outcome Measure ? ? Help from another person eating meals?: None ?Help from another person taking care of personal grooming?: None ?Help from another person toileting, which includes using toliet, bedpan, or urinal?: A Little ?Help from another person bathing (including washing, rinsing, drying)?: A Little ?Help from another person to put on and taking off regular  upper body clothing?: A Little ?Help from another person to put on and taking off regular lower body clothing?: A Little ?6 Click Score: 20 ? ?  ?End of Session Equipment Utilized During Treatment: Rolling walker (2 wheels);Oxygen ? ?OT Visit Diagnosis: Unsteadiness on feet (R26.81);Muscle weakness (generalized) (M62.81) ?  ?Activity Tolerance Patient limited by fatigue ?  ?Patient Left in bed;with call bell/phone within reach;with nursing/sitter in room ?  ?Nurse Communication Mobility status;Other (comment) (assisted/controlled fall at end of session, nursing came to room) ?  ? ?   ? ?Time: 7322-0254 ?OT Time Calculation (min): 42 min ? ?Charges: OT General Charges ?$OT Visit: 1 Visit ?OT Treatments ?$Self Care/Home Management : 8-22 mins ? ?Tyrone Schimke, OT ?Acute Rehabilitation Services ?Office: [phone] ? ? ?Tyrone Schimke H ?12/16/2021, 2:14 PM ?

## 2021-12-16 NOTE — Progress Notes (Signed)
Physical Therapy Treatment ?Patient Details ?Name: [name] ?MRN: [id number] ?DOB: 1930/11/16 ?Today's Date: 12/16/2021 ? ? ?History of Present Illness Patient is a 88 y/o female who presents on 12/08/21 with SOB. Found to have new onset A-fib and acute respiratory distress secondary to CHF exacerbation and SIRS. CXR-bilateral interstitial thickening concerning for edema versus pneumonia in the left lower lobe. S/p cardioversion 3/8. PMH includes HTN and RBBB., mild AR/MR. ? ?  ?PT Comments  ? ? Pt is progressing towards her physical therapy goals. Performed bed level exercises for warm up and ambulating 115 ft with a walker at a min guard assist level. Continues with decreased cardiopulmonary endurance, generalized weakness, and balance deficits. D/c plan remains appropriate.  ?   ?Recommendations for follow up therapy are one component of a multi-disciplinary discharge planning process, led by the attending physician.  Recommendations may be updated based on patient status, additional functional criteria and insurance authorization. ? ?Follow Up Recommendations ? Home health PT ?  ?  ?Assistance Recommended at Discharge Intermittent Supervision/Assistance  ?Patient can return home with the following Help with stairs or ramp for entrance;A little help with walking and/or transfers;A little help with bathing/dressing/bathroom;Assistance with cooking/housework ?  ?Equipment Recommendations ? None recommended by PT  ?  ?Recommendations for Other Services   ? ? ?  ?Precautions / Restrictions Precautions ?Precautions: Fall;Other (comment) ?Precaution Comments: watch HR/02; urinary incontinence (lasix); hyponatremia ?Restrictions ?Weight Bearing Restrictions: No  ?  ? ?Mobility ? Bed Mobility ?Overal bed mobility: Needs Assistance ?Bed Mobility: Supine to Sit, Sit to Supine ?  ?  ?Supine to sit: Min assist ?Sit to supine: Min guard ?  ?General bed mobility comments: MinA for trunk assist to upright. Pt able to  return self to bed without physical assist ?  ? ?Transfers ?Overall transfer level: Needs assistance ?Equipment used: Rolling walker (2 wheels) ?Transfers: Sit to/from Stand ?Sit to Stand: Min assist ?  ?  ?  ?  ?  ?General transfer comment: Light minA to power up to standing position ?  ? ?Ambulation/Gait ?Ambulation/Gait assistance: Min guard ?Gait Distance (Feet): 115 Feet ?Assistive device: Rolling walker (2 wheels) ?Gait Pattern/deviations: Step-through pattern, Decreased stride length, Trunk flexed ?Gait velocity: decreased ?  ?  ?General Gait Details: Cues for upward gaze, min guard for safety, no overt LOB ? ? ?Stairs ?  ?  ?  ?  ?  ? ? ?Wheelchair Mobility ?  ? ?Modified Rankin (Stroke Patients Only) ?  ? ? ?  ?Balance Overall balance assessment: Needs assistance ?Sitting-balance support: Feet supported, No upper extremity supported ?Sitting balance-Leahy Scale: Good ?  ?  ?Standing balance support: During functional activity, Bilateral upper extremity supported ?Standing balance-Leahy Scale: Poor ?  ?  ?  ?  ?  ?  ?  ?  ?  ?  ?  ?  ?  ? ?  ?Cognition Arousal/Alertness: Awake/alert ?Behavior During Therapy: [facility] for tasks assessed/performed ?Overall Cognitive Status: Within Functional Limits for tasks assessed ?  ?  ?  ?  ?  ?  ?  ?  ?  ?  ?  ?  ?  ?  ?  ?  ?  ?  ?  ? ?  ?Exercises General Exercises - Lower Extremity ?Ankle Circles/Pumps: Both, 20 reps, Supine ?Heel Slides: Both, 10 reps, Supine ?Straight Leg Raises: Both, 10 reps, Supine ? ?  ?General Comments General comments (skin integrity, edema, etc.): HR to 114 bpm, SpO2 > 90% on  3L O2 ?  ?  ? ?Pertinent Vitals/Pain Pain Assessment ?Pain Assessment: Faces ?Faces Pain Scale: No hurt ?Pain Location: R knee ?Pain Descriptors / Indicators: Aching ?Pain Intervention(s): Monitored during session  ? ? ?Home Living   ?  ?  ?  ?  ?  ?  ?  ?  ?  ?   ?  ?Prior Function    ?  ?  ?   ? ?PT Goals (current goals can now be found in the care plan section) Acute Rehab  PT Goals ?Patient Stated Goal: to go home ?Potential to Achieve Goals: Good ?Progress towards PT goals: Progressing toward goals ? ?  ?Frequency ? ? ? Min 3X/week ? ? ? ?  ?PT Plan Current plan remains appropriate  ? ? ?Co-evaluation PT/OT/SLP Co-Evaluation/Treatment: Yes ?Reason for Co-Treatment: Other (comment) (assisting pt post fall) ?  ?  ?  ? ?  ?AM-PAC PT "6 Clicks" Mobility   ?Outcome Measure ? Help needed turning from your back to your side while in a flat bed without using bedrails?: A Little ?Help needed moving from lying on your back to sitting on the side of a flat bed without using bedrails?: A Little ?Help needed moving to and from a bed to a chair (including a wheelchair)?: A Little ?Help needed standing up from a chair using your arms (e.g., wheelchair or bedside chair)?: A Little ?Help needed to walk in hospital room?: A Little ?Help needed climbing 3-5 steps with a railing? : A Lot ?6 Click Score: 17 ? ?  ?End of Session Equipment Utilized During Treatment: Oxygen;Gait belt ?Activity Tolerance: Patient tolerated treatment well ?Patient left: in bed;with call bell/phone within reach;with family/visitor present ?Nurse Communication: Mobility status ?PT Visit Diagnosis: Muscle weakness (generalized) (M62.81);Difficulty in walking, not elsewhere classified (R26.2) ?  ? ? ?Time: 7672-0947 ?PT Time Calculation (min) (ACUTE ONLY): 24 min ? ?Charges:  $Gait Training: 8-22 mins ?$Therapeutic Activity: 8-22 mins          ?          ? ?[name], PT, DPT ?Acute Rehabilitation Services ?Pager [phone] ?Office [phone] ? ? ? ?[name] ?12/16/2021, 4:11 PM ? ?

## 2021-12-16 NOTE — Progress Notes (Signed)
?[facility] ?Progress Note  ? ?Subjective:   feeling somewhat improved today.  Denies HA, confusion, vision changes.  Breathing improved c/w admssion.  Remains on [place], not on typically.  Ate a decent breakfast - hasn't had any nutritional shakes between meals yet.  No concerns except what is serum sodium this AM.  ? ?Objective ?Vitals:  ? 12/15/21 1600 12/15/21 1900 12/15/21 2337 12/16/21 0453  ?BP: (!) 121/59 (!) 120/50 (!) 131/97 (!) 108/58  ?Pulse:  (!) 55 64 (!) 57  ?Resp:  '16 20 17  '$ ?Temp: 97.6 ?F (36.4 ?C) 98.2 ?F (36.8 ?C) 98.4 ?F (36.9 ?C) 97.6 ?F (36.4 ?C)  ?TempSrc: Oral Oral Oral Oral  ?SpO2:  95% 94% 94%  ?Weight:    89.9 kg  ?Height:      ?I/Os: 1.9 / 1.025 all UOP ?Wt 89.9kg from 90.6kg yesterday ?Physical Exam ?General: elderly woman eating breakfast - finishing oatmeal ?Heart: reg brady ?Lungs: clear ant and laterally, normal WOB with conversation ?Abdomen: soft, nontender ?Extremities: trace dependent thigh edema ?Neuro: nonfocal, conversant and oriented ? ?Additional Objective ?Labs: ?Basic Metabolic Panel: ?Recent Labs  ?Lab 12/14/21 ?0616 12/15/21 ?0005 12/16/21 ?0041  ?NA 116* 120* 124*  ?K 3.9 3.5 3.5  ?CL 80* 82* 83*  ?CO2 '23 29 29  '$ ?GLUCOSE 106* 102* 106*  ?BUN 28* 22 20  ?CREATININE 0.89 0.88 0.97  ?CALCIUM 8.4* 8.1* 8.3*  ?PHOS 4.1 3.8 3.4  ? ?Liver Function Tests: ?Recent Labs  ?Lab 12/13/21 ?1731 12/14/21 ?9562 12/14/21 ?1308 12/15/21 ?0005 12/16/21 ?0041  ?AST 22  --   --   --   --   ?ALT 20  --   --   --   --   ?ALKPHOS 80  --   --   --   --   ?BILITOT 0.6  --   --   --   --   ?PROT 6.1*  --   --   --   --   ?ALBUMIN 3.1*   < > 3.3* 2.9* 2.8*  ? < > = values in this interval not displayed.  ? ?No results for input(s): LIPASE, AMYLASE in the last 168 hours. ?CBC: ?Recent Labs  ?Lab 12/10/21 ?0059 12/11/21 ?6578  ?WBC 10.4 13.3*  ?HGB 11.4* 12.2  ?HCT 34.2* 35.5*  ?MCV 82.2 80.3  ?PLT 318 362  ? ?Blood Culture ?   ?Component Value Date/Time  ? SDES  12/08/2021 1205  ?   BLOOD LEFT ANTECUBITAL ?Performed at [facility], [address] [place], [place] [address] ?  ? SPECREQUEST  12/08/2021 1205  ?  BOTTLES DRAWN AEROBIC AND ANAEROBIC Blood Culture adequate volume ?Performed at [facility], [address] [place], [place] [address] ?  ? CULT  12/08/2021 1205  ?  NO GROWTH 5 DAYS ?Performed at [facility], [place] [address], [place], [place] [address] ?  ? REPTSTATUS 12/13/2021 FINAL 12/08/2021 1205  ? ? ?Cardiac Enzymes: ?No results for input(s): CKTOTAL, CKMB, CKMBINDEX, TROPONINI in the last 168 hours. ?CBG: ?No results for input(s): GLUCAP in the last 168 hours. ?Iron Studies: No results for input(s): IRON, TIBC, TRANSFERRIN, FERRITIN in the last 72 hours. ?'@lablastinr3'$ @ ?Studies/Results: ?No results found. ?Medications: ? sodium chloride 20 mL/hr at 12/13/21 2153  ? furosemide 160 mg (12/16/21 0707)  ? ? apixaban  5 mg Oral BID  ? feeding supplement  237 mL Oral BID BM  ? levothyroxine  200 mcg Oral Q0600  ? mouth rinse  15 mL Mouth Rinse BID  ? metoprolol succinate  50 mg Oral Daily  ? mometasone-formoterol  2 puff Inhalation BID  ? polyethylene glycol  17 g Oral BID  ? potassium chloride  20 mEq Oral Daily  ? pravastatin  40 mg Oral q1800  ? sacubitril-valsartan  1 tablet Oral BID  ? senna-docusate  2 tablet Oral BID  ? sodium chloride flush  3 mL Intravenous Q12H  ? vitamin B-1  125 mg Oral QPM  ? ? ?Assessment/Plan: ?**hyponatremia:  hypervolemic.  Was as low as 113 and has trended up to 124 this AM.  Cortisol normal. TSH high, T3 low, T4 high; on synthroid.  Urine sodium 54, urinc osm 298 - while on loop diuretic.   Continue diuresis - net neg 9.1L for admission by I/Os; will switch from IV diuretic to po torsemide 100 BID today.  Concern for component of poor solute intake; cont BID supplemental nutritional shakes - has ordered but says not receiving?, d/c nacl tabs.  On regular diet with 1232m fluid restriction at this time.   Cont to follow closely.  ? ?**HFrEF: EF 45% with mod MR.   On BB and entresto.  Cardiology following.   ? ?**A fib:  s/p DCCV, on eliquis. On BB.  Cardiology following.  ? ?**HTN:  BP low to normal.   ? ?Will follow, call with concerns.  Next labs in AM.  ? ?LJannifer HickMD ?12/16/2021, 8:03 AM  ?[facility] ?Pager: [phone]? ? ?

## 2021-12-16 NOTE — Progress Notes (Signed)
?Progress Note ? ? ?Patient: [name]:[id number] DOB: 1931-01-14 DOA: 12/08/2021     8 ?DOS: the patient was seen and examined on 12/16/2021 ?  ?Brief hospital course: ?[name] was admitted to the hospital with the working diagnosis of acute on chronic heart failure decompensation.  ? ?88 yo female with the past medical history of hypertension, dyslipidemia, and hypothyroid who presented with dyspnea. She reported dyspnea on exertion that became acutely worse at 4 am, prompting her to come to the hospital. She also reported recent worsening of cough and feeling palpitations. On her initial physical examination her blood pressure was 115/77, HR 109, RR 22 and 02 saturation 93%, heart with S1 and S2 present, irregularly irregular, with no gallops or rubs, lungs with intermittent rales and positive wheezing, abdomen soft and positive lower extremity edema.  ? ?Na 125, K 4,1 CL 93, bicarbonate 23, glucose 102, bun 13 cr 0,70 ?BNP 453 ?Wbc 8,7. Hgb 11,9 hct 36,5 Plt 323  ?D dimer 0,80 ?Sars covid 19 negative  ? ?Chest radiograph with increased lung markings bilaterally, small bilateral pleural effusions. ? ?EKG 115 bpm, normal axis, qtc 525, right bundle branch block, atrial fibrillation rhythm, no significant ST segment or T wave changes.   ? ?Patient was placed on furosemide for diuresis.  ? ?Cardiology was consulted with recommendations for cardioversion.  ?03/08 direct current cardioversion with conversion to sinus rhythm.  ? ?Patient with worsening hyponatremia, nephrology consulted.  ? ?Patient was placed on aggressive diuresis with furosemide for hypervolemia.  ? ?Slowly improving and recovering from hyponatremia.  ? ? ?Assessment and Plan: ?Acute CHF (congestive heart failure) ([place]) ?Echocardiogram with LV EF 45% with global hypokinesis, mild reduction of RV systolic function, RSVP 58,8. Moderate mitral regurgitation. Mild dilatation of ascending aorta.  ? ?Urine output is 1.025 cc over last 24 hrs  with improvement in her symptoms.  ?Systolic blood pressure 325 to 122 mmHg  ?Warm lower extremities.  ? ?Heart failure management with entresto and metoprolol.  ?Transition to torsemide  ? ?Acute hypoxemic respiratory failure, continue with supplemental 02 per [place], diuresis for cardiogenic pulmonary edema. ?Continue with inhaled corticosteroids, will need outpatient pulmonary function testing.  ? ?Atrial fibrillation with RVR ([place]) ?SP cardioversion recovered sinus rhythm. ?Continue with metoprolol and anticoagulation with apixaban.  ?  ? ?  ? ?Essential hypertension ?Continue with entresto and metoprolol.  ?Continue close blood pressure monitoring.  ? ?Hyponatremia ?Hypokalemia/ hypomagnesemia.  ? ?Today serum Na is 124, K is 3,5 and serum bicarbonate is 29.  ?Diuresis with furosemide drip now transitioned to torsemide po.  ? ?Continue nutritional support to increase urine solute.  ?Ensure.  ?Follow up renal function in am, including Mg.  ? ?Normocytic anemia ?Cell count has been stable ?Reactive leukocytosis 13.3 on 03/8  ? ?Hypothyroidism ?Patient with high TSH along with low free T4 and free T3. ?On levothyroxine and follow up thyroid function test in 3 weeks as outpatient.  ? ? ?Class 1 obesity ?Calculated BMI is 33,0 ?Plan to encourage mobility, out of bed to chair tid with meals. ?Pt and Ot.  ? ?SIRS (systemic inflammatory response syndrome) (HCC)-resolved as of 12/11/2021 ?- Clinically do not suspect pneumonia, chest x-ray findings are suggestive of CHF and atelectasis ?-Afebrile, no leukocytosis, lactate and procalcitonin are normal ?-discontinued antibiotics yesterday ? ? ? ? ?  ? ?Subjective: Patient is feeling better, dyspnea continue to improve, she is tolerating po diet  ? ?Physical Exam: ?Vitals:  ? 12/15/21 2337 12/16/21 0453  12/16/21 0803 12/16/21 0843  ?BP: (!) 131/97 (!) 108/58 (!) 122/50   ?Pulse: 64 (!) 57 62   ?Resp: '20 17 17   '$ ?Temp: 98.4 ?F (36.9 ?C) 97.6 ?F (36.4 ?C) 98 ?F (36.7 ?C)    ?TempSrc: Oral Oral Oral   ?SpO2: 94% 94% 97% 97%  ?Weight:  89.9 kg    ?Height:      ? ?Neurology awake and alert ?ENT with no pallor ?Cardiovascular with S1 and S2 present and rhythmic, with gallops or murmurs ?No JVD ?No lower extremity edema ?Respiratory with no rales on anterior auscultation ?Abdomen not distended  ?Data Reviewed: ? ? ? ?Family Communication: no family at the bedside  ? ?Disposition: ?Status is: Inpatient ?Remains inpatient appropriate because: hyponatremia  ? Planned Discharge Destination: Home ? ? ? ? ? ?Author: ?Tawni Millers, MD ?12/16/2021 9:13 AM ? ?For on call review [email or web address].  ?

## 2021-12-17 ENCOUNTER — Institutional Professional Consult (permissible substitution): Payer: Medicare HMO | Admitting: Pulmonary Disease

## 2021-12-17 DIAGNOSIS — I4891 Unspecified atrial fibrillation: Secondary | ICD-10-CM | POA: Diagnosis not present

## 2021-12-17 DIAGNOSIS — I10 Essential (primary) hypertension: Secondary | ICD-10-CM | POA: Diagnosis not present

## 2021-12-17 DIAGNOSIS — E871 Hypo-osmolality and hyponatremia: Secondary | ICD-10-CM | POA: Diagnosis not present

## 2021-12-17 DIAGNOSIS — I5031 Acute diastolic (congestive) heart failure: Secondary | ICD-10-CM | POA: Diagnosis not present

## 2021-12-17 LAB — RENAL FUNCTION PANEL
Albumin: 2.9 g/dL — ABNORMAL LOW (ref 3.5–5.0)
Anion gap: 10 (ref 5–15)
BUN: 18 mg/dL (ref 8–23)
CO2: 33 mmol/L — ABNORMAL HIGH (ref 22–32)
Calcium: 8.6 mg/dL — ABNORMAL LOW (ref 8.9–10.3)
Chloride: 81 mmol/L — ABNORMAL LOW (ref 98–111)
Creatinine, Ser: 0.88 mg/dL (ref 0.44–1.00)
GFR, Estimated: >60 mL/min (ref >60)
Glucose, Bld: 108 mg/dL — ABNORMAL HIGH (ref 70–99)
Phosphorus: 3.4 mg/dL (ref 2.5–4.6)
Potassium: 3.2 mmol/L — ABNORMAL LOW (ref 3.5–5.1)
Sodium: 124 mmol/L — ABNORMAL LOW (ref 135–145)

## 2021-12-17 LAB — MAGNESIUM: Magnesium: 1.5 mg/dL — ABNORMAL LOW (ref 1.7–2.4)

## 2021-12-17 MED ORDER — TORSEMIDE 20 MG PO TABS: 80.0000 mg | ORAL_TABLET | Freq: Every day | ORAL | Status: DC

## 2021-12-17 MED ORDER — VITAMIN B-1 50 MG PO TABS
125.0000 mg | ORAL_TABLET | Freq: Every evening | ORAL | Status: DC
  Administered 2021-12-17 – 2021-12-21 (×5): 125 mg via ORAL
  Filled 2021-12-17 (×6): qty 3

## 2021-12-17 MED ORDER — MAGNESIUM SULFATE 2 GM/50ML IV SOLN
2.0000 g | Freq: Once | INTRAVENOUS | Status: AC
  Administered 2021-12-17: 2 g via INTRAVENOUS
  Filled 2021-12-17: qty 50

## 2021-12-17 MED ORDER — POTASSIUM CHLORIDE CRYS ER 20 MEQ PO TBCR
20.0000 meq | EXTENDED_RELEASE_TABLET | Freq: Every day | ORAL | Status: DC
  Administered 2021-12-18 – 2021-12-22 (×5): 20 meq via ORAL
  Filled 2021-12-17 (×5): qty 1

## 2021-12-17 MED ORDER — POTASSIUM CHLORIDE CRYS ER 20 MEQ PO TBCR
40.0000 meq | EXTENDED_RELEASE_TABLET | Freq: Two times a day (BID) | ORAL | Status: AC
  Administered 2021-12-17 (×2): 40 meq via ORAL
  Filled 2021-12-17 (×2): qty 2

## 2021-12-17 NOTE — Progress Notes (Signed)
Pt resting comfortable on 3L [place] . RR 12 and SpO2 97%. BiPAP not needed at this time. Will continue to monitor ?

## 2021-12-17 NOTE — Progress Notes (Signed)
?[facility] ?Progress Note  ? ?Subjective:  Feeling improved but not back to baseline - weak but improved.  Breathing stable.  Had a nutritional shake yesterday and ate a good breakfast today inc eggs.  ? ?Objective ?Vitals:  ? 12/16/21 2330 12/17/21 0340 12/17/21 0500 12/17/21 0550  ?BP: (!) 131/57 122/74    ?Pulse: 62 60    ?Resp: '20 16  18  '$ ?Temp: 98 ?F (36.7 ?C) 97.9 ?F (36.6 ?C)    ?TempSrc: Oral Oral    ?SpO2: 98% 98%  95%  ?Weight:   88.6 kg   ?Height:      ?I/Os: 0.8 / 2400 all UOP ?Wt 88.6kg from 89.9kg yesterday ?Physical Exam ?General: elderly woman eating breakfast ?Heart: rrr ?Lungs: clear ant and laterally, normal WOB with conversation ?Abdomen: soft, nontender ?Extremities: improved thigh edema ?Neuro: nonfocal, conversant and oriented ? ?Additional Objective ?Labs: ?Basic Metabolic Panel: ?Recent Labs  ?Lab 12/15/21 ?0005 12/16/21 ?0041 12/17/21 ?0037  ?NA 120* 124* 124*  ?K 3.5 3.5 3.2*  ?CL 82* 83* 81*  ?CO2 29 29 33*  ?GLUCOSE 102* 106* 108*  ?BUN '22 20 18  '$ ?CREATININE 0.88 0.97 0.88  ?CALCIUM 8.1* 8.3* 8.6*  ?PHOS 3.8 3.4 3.4  ? ? ?Liver Function Tests: ?Recent Labs  ?Lab 12/13/21 ?1731 12/14/21 ?9518 12/15/21 ?0005 12/16/21 ?0041 12/17/21 ?0037  ?AST 22  --   --   --   --   ?ALT 20  --   --   --   --   ?ALKPHOS 80  --   --   --   --   ?BILITOT 0.6  --   --   --   --   ?PROT 6.1*  --   --   --   --   ?ALBUMIN 3.1*   < > 2.9* 2.8* 2.9*  ? < > = values in this interval not displayed.  ? ? ?No results for input(s): LIPASE, AMYLASE in the last 168 hours. ?CBC: ?Recent Labs  ?Lab 12/11/21 ?8416  ?WBC 13.3*  ?HGB 12.2  ?HCT 35.5*  ?MCV 80.3  ?PLT 362  ? ? ?Blood Culture ?   ?Component Value Date/Time  ? SDES  12/08/2021 1205  ?  BLOOD LEFT ANTECUBITAL ?Performed at [facility], [address] [place], [place] [address] ?  ? SPECREQUEST  12/08/2021 1205  ?  BOTTLES DRAWN AEROBIC AND ANAEROBIC Blood Culture adequate volume ?Performed at [facility], [address], [place], [place] [address] ?  ? CULT  12/08/2021 1205  ?  NO GROWTH 5 DAYS ?Performed at [facility], [place] [address], [place], [place] [address] ?  ? REPTSTATUS 12/13/2021 FINAL 12/08/2021 1205  ? ? ?Cardiac Enzymes: ?No results for input(s): CKTOTAL, CKMB, CKMBINDEX, TROPONINI in the last 168 hours. ?CBG: ?No results for input(s): GLUCAP in the last 168 hours. ?Iron Studies: No results for input(s): IRON, TIBC, TRANSFERRIN, FERRITIN in the last 72 hours. ?'@lablastinr3'$ @ ?Studies/Results: ?No results found. ?Medications: ? sodium chloride Stopped (12/16/21 1900)  ? ? apixaban  5 mg Oral BID  ? feeding supplement  237 mL Oral BID BM  ? levothyroxine  200 mcg Oral Q0600  ? mouth rinse  15 mL Mouth Rinse BID  ? metoprolol succinate  50 mg Oral Daily  ? mometasone-formoterol  2 puff Inhalation BID  ? polyethylene glycol  17 g Oral BID  ? potassium chloride  20 mEq Oral Daily  ? pravastatin  40 mg Oral  q1800  ? sacubitril-valsartan  1 tablet Oral BID  ? senna-docusate  2 tablet Oral BID  ? sodium chloride flush  3 mL Intravenous Q12H  ? vitamin B-1  125 mg Oral QPM  ? torsemide  100 mg Oral BID  ? ? ?Assessment/Plan: ?**hyponatremia:  hypervolemic.  Was as low as 113 and has trended up to 124 yesterday am and was stable at 124 this AM.  Cortisol normal. TSH high, T3 low, T4 high; on synthroid.  Initial indices with Urine sodium 54, urinc osm 298 - while on loop diuretic.   Appears to be approaching a more euvolemic state and was quite net neg on torsemide 100 BID yesterday - will reduce to 80 daily today and follow.  Concern for component of poor solute intake; cont BID supplemental nutritional shakes.  On regular diet with 1233m fluid restriction at this time.  Cont to follow closely.  ? ?**HFrEF: EF 45% with mod MR.   On BB and entresto.  Cardiology following.   ? ?**A fib:  s/p DCCV, on eliquis. On BB.  Cardiology following.  ? ?**HTN:  BP low to normal.   ? ?**hypokalemia: on KCL  20 daily, increase to 40 BID today then resume home dose.  ? ?PT recommending home with HHPT. ? ?Will follow, call with concerns.  Next labs in AM.  ?I think she's getting much closer to d/c but should stay for 1-2 more days to monitor sodium and volume.  ? ?LJannifer HickMD ?12/17/2021, 7:33 AM  ?[facility] ?Pager: ([phone]? ? ?

## 2021-12-17 NOTE — Progress Notes (Signed)
?Progress Note ? ? ?Patient: [name] KZL:[id number] DOB: 05-08-1931 DOA: 12/08/2021     9 ?DOS: the patient was seen and examined on 12/17/2021 ?  ?Brief hospital course: ?[name] was admitted to the hospital with the working diagnosis of acute on chronic heart failure decompensation.  ? ?88 yo female with the past medical history of hypertension, dyslipidemia, and hypothyroid who presented with dyspnea. She reported dyspnea on exertion that became acutely worse at 4 am, prompting her to come to the hospital. She also reported recent worsening of cough and feeling palpitations. On her initial physical examination her blood pressure was 115/77, HR 109, RR 22 and 02 saturation 93%, heart with S1 and S2 present, irregularly irregular, with no gallops or rubs, lungs with intermittent rales and positive wheezing, abdomen soft and positive lower extremity edema.  ? ?Na 125, K 4,1 CL 93, bicarbonate 23, glucose 102, bun 13 cr 0,70 ?BNP 453 ?Wbc 8,7. Hgb 11,9 hct 36,5 Plt 323  ?D dimer 0,80 ?Sars covid 19 negative  ? ?Chest radiograph with increased lung markings bilaterally, small bilateral pleural effusions. ? ?EKG 115 bpm, normal axis, qtc 525, right bundle branch block, atrial fibrillation rhythm, no significant ST segment or T wave changes.   ? ?Patient was placed on furosemide for diuresis.  ? ?Cardiology was consulted with recommendations for cardioversion.  ?03/08 direct current cardioversion with conversion to sinus rhythm.  ? ?Patient with worsening hyponatremia, nephrology consulted.  ? ?Patient was placed on aggressive diuresis with furosemide for hypervolemia.  ? ?Slowly improving and recovering from hyponatremia.  ? ? ?Assessment and Plan: ?Acute CHF (congestive heart failure) ([facility]) ?Echocardiogram with LV EF 45% with global hypokinesis, mild reduction of RV systolic function, RSVP 39,0. Moderate mitral regurgitation. Mild dilatation of ascending aorta.  ? ?Urine output is 2400 cc over last 24 hrs  with improvement in her symptoms.  ? ?Continue with entresto and metoprolol.  ?Transition to torsemide  ? ?Acute hypoxemic respiratory failure, continue with supplemental 02 per [place], diuresis for cardiogenic pulmonary edema. ?Continue with inhaled corticosteroids, will need outpatient pulmonary function testing.  ? ?Atrial fibrillation with RVR ([place]) ?SP cardioversion recovered sinus rhythm. ?Continue with metoprolol and anticoagulation with apixaban.  ?  ? ?  ? ?Essential hypertension ?Continue with entresto and metoprolol.  ?Continue close blood pressure monitoring.  ? ?Hyponatremia ?Hypokalemia/ hypomagnesemia.  ? ?Stable renal function and improving volume status, serum Na is 124 with K at 3,2 and serum bicarbonate at 33. Mg 1.5 ? ?Continue K and Mg correction and follow up renal function in am ?Continue diuresis with torsemide.  ?Continue to encourage increase po intake to increase solute load.  ? ?Normocytic anemia ?Cell count has been stable ?Reactive leukocytosis 13.3 on 03/8  ? ?Hypothyroidism ?Patient with high TSH along with low free T4 and free T3. ?On levothyroxine and follow up thyroid function test in 3 weeks as outpatient.  ? ? ?Class 1 obesity ?Calculated BMI is 33,0 ?Plan to encourage mobility, out of bed to chair tid with meals. ?Pt and Ot.  ? ?SIRS (systemic inflammatory response syndrome) (HCC)-resolved as of 12/11/2021 ?- Clinically do not suspect pneumonia, chest x-ray findings are suggestive of CHF and atelectasis ?-Afebrile, no leukocytosis, lactate and procalcitonin are normal ?-discontinued antibiotics yesterday ? ? ? ? ?  ? ?Subjective: patient is feeling better, no nausea or vomiting, but continue with poor oral intake ? ?Physical Exam: ?Vitals:  ? 12/17/21 0754 12/17/21 0914 12/17/21 0941 12/17/21 1102  ?BP: Marland Kitchen)  130/51   (!) 90/58  ?Pulse: (!) 57 88  89  ?Resp: 17   16  ?Temp: 98 ?F (36.7 ?C)   97.6 ?F (36.4 ?C)  ?TempSrc: Oral   Oral  ?SpO2: 95%  96% 93%  ?Weight:      ?Height:       ? ?Neurology awake and alert ?ENT with mild pallor ?Cardiovascular with S1 and S2 present and rhythmic with no gallops or murmurs, no rubs ?No JVD ?No lower extremity edema ?Respiratory with no wheezing or rales ?Abdomen soft and not distended  ?Data Reviewed: ? ? ? ?Family Communication: no family at the bedside  ? ?Disposition: ?Status is: Inpatient ?Remains inpatient appropriate because: heart failure and hyponatremia  ? Planned Discharge Destination: Home ? ? ? ?Author: ?Tawni Millers, MD ?12/17/2021 5:02 PM ? ?For on call review [email or web address].  ?

## 2021-12-17 NOTE — Progress Notes (Signed)
Physical Therapy Treatment ?Patient Details ?Name: [name] ?MRN: [id number] ?DOB: 04-10-1931 ?Today's Date: 12/17/2021 ? ? ?History of Present Illness Patient is a 88 y/o female who presents on 12/08/21 with SOB. Found to have new onset A-fib and acute respiratory distress secondary to CHF exacerbation and SIRS. CXR-bilateral interstitial thickening concerning for edema versus pneumonia in the left lower lobe. S/p cardioversion 3/8. PMH includes HTN and RBBB., mild AR/MR. ? ?  ?PT Comments  ? ? Patient progressing slowly towards PT goals. Reports getting worn out pretty quickly with any activity and needs some encouragement to participate in mobility. Requires more assist to stand from EOB today, likely due to being up in chair all day and fatigued. Session focused on functional transfers and ambulation. Sp02 dropped to 87% on 2L/min 02 [place]. HR up to 120s bpm A-fib. Pt upset that she is back in A-fib. Recommend continued mobility while in the hospital to address endurance, strength and activity tolerance. Will follow. ?  ?Recommendations for follow up therapy are one component of a multi-disciplinary discharge planning process, led by the attending physician.  Recommendations may be updated based on patient status, additional functional criteria and insurance authorization. ? ?Follow Up Recommendations ? Home health PT ?  ?  ?Assistance Recommended at Discharge Intermittent Supervision/Assistance  ?Patient can return home with the following Help with stairs or ramp for entrance;A little help with walking and/or transfers;A little help with bathing/dressing/bathroom;Assistance with cooking/housework ?  ?Equipment Recommendations ? None recommended by PT  ?  ?Recommendations for Other Services   ? ? ?  ?Precautions / Restrictions Precautions ?Precautions: Fall;Other (comment) ?Precaution Comments: watch HR/02; urinary incontinence ([place]) ?Restrictions ?Weight Bearing Restrictions: No  ?  ? ?Mobility ? Bed  Mobility ?Overal bed mobility: Needs Assistance ?Bed Mobility: Supine to Sit ?  ?  ?Supine to sit: Min assist, HOB elevated ?  ?  ?General bed mobility comments: MinA for trunk assist to upright. Pt able to return self to bed without physical assist ?  ? ?Transfers ?Overall transfer level: Needs assistance ?Equipment used: Rolling walker (2 wheels) ?Transfers: Sit to/from Stand ?Sit to Stand: Mod assist ?  ?  ?  ?  ?  ?General transfer comment: LIght mod A to power to standing with cues for hand placement/technique. ?  ? ?Ambulation/Gait ?Ambulation/Gait assistance: Min guard ?Gait Distance (Feet): 80 Feet ?Assistive device: Rolling walker (2 wheels) ?Gait Pattern/deviations: Step-through pattern, Decreased stride length, Trunk flexed ?Gait velocity: decreased ?  ?  ?General Gait Details: Cues for upward gaze, min guard for safety, no overt LOB. Fatigues. HR up to 120s A-fib ? ? ?Stairs ?  ?  ?  ?  ?  ? ? ?Wheelchair Mobility ?  ? ?Modified Rankin (Stroke Patients Only) ?  ? ? ?  ?Balance Overall balance assessment: Needs assistance ?Sitting-balance support: Feet supported, No upper extremity supported ?Sitting balance-Leahy Scale: Good ?  ?  ?Standing balance support: During functional activity, Reliant on assistive device for balance ?Standing balance-Leahy Scale: Poor ?  ?  ?  ?  ?  ?  ?  ?  ?  ?  ?  ?  ?  ? ?  ?Cognition Arousal/Alertness: Awake/alert ?Behavior During Therapy: [facility] for tasks assessed/performed ?Overall Cognitive Status: Within Functional Limits for tasks assessed ?  ?  ?  ?  ?  ?  ?  ?  ?  ?  ?  ?  ?  ?  ?  ?  ?General Comments: pt  somewhat slow processing and mild STM deficit likely baseline, Conversing appropriately with family in the room. ?  ?  ? ?  ?Exercises   ? ?  ?General Comments General comments (skin integrity, edema, etc.): Family present during session. Sp02 dropped to 87% on 2L/min 02 [place]. HR up to 120s bpm A-fib. ?  ?  ? ?Pertinent Vitals/Pain Pain Assessment ?Pain Assessment:  No/denies pain  ? ? ?Home Living   ?  ?  ?  ?  ?  ?  ?  ?  ?  ?   ?  ?Prior Function    ?  ?  ?   ? ?PT Goals (current goals can now be found in the care plan section) Progress towards PT goals: Progressing toward goals (slowly) ? ?  ?Frequency ? ? ? Min 3X/week ? ? ? ?  ?PT Plan Current plan remains appropriate  ? ? ?Co-evaluation   ?  ?  ?  ?  ? ?  ?AM-PAC PT "6 Clicks" Mobility   ?Outcome Measure ? Help needed turning from your back to your side while in a flat bed without using bedrails?: A Little ?Help needed moving from lying on your back to sitting on the side of a flat bed without using bedrails?: A Lot ?Help needed moving to and from a bed to a chair (including a wheelchair)?: A Little ?Help needed standing up from a chair using your arms (e.g., wheelchair or bedside chair)?: A Lot ?Help needed to walk in hospital room?: A Little ?Help needed climbing 3-5 steps with a railing? : A Lot ?6 Click Score: 15 ? ?  ?End of Session Equipment Utilized During Treatment: Oxygen;Gait belt ?Activity Tolerance: Patient tolerated treatment well ?Patient left: in bed;with call bell/phone within reach;with family/visitor present;with bed alarm set ?Nurse Communication: Mobility status ?PT Visit Diagnosis: Muscle weakness (generalized) (M62.81);Difficulty in walking, not elsewhere classified (R26.2) ?  ? ? ?Time: 5956-3875 ?PT Time Calculation (min) (ACUTE ONLY): 24 min ? ?Charges:  $Gait Training: 8-22 mins ?$Therapeutic Activity: 8-22 mins          ?          ? ?Marisa Severin, PT, DPT ?Acute Rehabilitation Services ?Pager [phone] ?Office [phone] ? ? ? ? ? ?[place] ?12/17/2021, 3:28 PM ? ?

## 2021-12-17 NOTE — Progress Notes (Addendum)
? ?Progress Note ? ?Patient Name: [name] ?Date of Encounter: 12/17/2021 ? ?[facility] Cardiologist: Oval Linsey ? ?Patient Profile  ?   ?88 y.o. female with hypertension, hyperlipidemia, and newly diagnosed atrial fibrillation >> TEE/DCCV.  12/11/21 Echo   EF 45 %  --MR mod  On anticoagulation with Apixaban   ? ? ? ?Subjective  ? ?Feeling better but back in afib this am Na slightly improved  ? ?Inpatient Medications  ?  ?Scheduled Meds: ? apixaban  5 mg Oral BID  ? feeding supplement  237 mL Oral BID BM  ? levothyroxine  200 mcg Oral Q0600  ? mouth rinse  15 mL Mouth Rinse BID  ? metoprolol succinate  50 mg Oral Daily  ? mometasone-formoterol  2 puff Inhalation BID  ? polyethylene glycol  17 g Oral BID  ? [START ON 12/18/2021] potassium chloride  20 mEq Oral Daily  ? potassium chloride  40 mEq Oral BID  ? pravastatin  40 mg Oral q1800  ? sacubitril-valsartan  1 tablet Oral BID  ? senna-docusate  2 tablet Oral BID  ? sodium chloride flush  3 mL Intravenous Q12H  ? vitamin B-1  125 mg Oral QPM  ? [START ON 12/18/2021] torsemide  80 mg Oral Daily  ? ?Continuous Infusions: ? sodium chloride Stopped (12/16/21 1900)  ? ?PRN Meds: ?sodium chloride, acetaminophen, albuterol, fluticasone, LORazepam, ondansetron (ZOFRAN) IV, sodium chloride flush  ? ?Vital Signs  ?  ?Vitals:  ? 12/17/21 0340 12/17/21 0500 12/17/21 0550 12/17/21 0754  ?BP: 122/74   (!) 130/51  ?Pulse: 60   (!) 57  ?Resp: '16  18 17  '$ ?Temp: 97.9 ?F (36.6 ?C)   98 ?F (36.7 ?C)  ?TempSrc: Oral   Oral  ?SpO2: 98%  95% 95%  ?Weight:  88.6 kg    ?Height:      ? ? ?Intake/Output Summary (Last 24 hours) at 12/17/2021 0847 ?Last data filed at 12/17/2021 0340 ?Gross per 24 hour  ?Intake 813 ml  ?Output 2400 ml  ?Net -1587 ml  ? ?Last 3 Weights 12/17/2021 12/16/2021 12/15/2021  ?Weight (lbs) 195 lb 5.2 oz 198 lb 3.1 oz 199 lb 11.8 oz  ?Weight (kg) 88.6 kg 89.9 kg 90.6 kg  ?   ? ?Telemetry  ?  ?Afib rates 80-100 bpm ? ?  ? ?Physical Exam  ? ?VS:  BP (!) 130/51 (BP  Location: Right Arm)   Pulse (!) 57   Temp 98 ?F (36.7 ?C) (Oral)   Resp 17   Ht 5' 6.5" (1.689 m)   Wt 88.6 kg   SpO2 95%   BMI 31.05 kg/m?  , BMI Body mass index is 31.05 kg/m?. ?  ?Well developed and nourished in no acute distress ?HENT normal ?Neck supple  ?Basilar crackles  ?Regular rate and rhythm, no murmurs or gallops ?Abd-soft  ?No Clubbing cyanosis tr edema ?Skin-warm and dry ?A & Oriented  Grossly normal sensory and motor function ?  ? ?Labs  ?  ?High Sensitivity Troponin:   ?Recent Labs  ?Lab 12/08/21 ?1158 12/12/21 ?0518  ?TROPONINIHS 6 10  ?   ?Chemistry ?Recent Labs  ?Lab 12/12/21 ?0518 12/13/21 ?2336 12/13/21 ?1731 12/14/21 ?1224 12/15/21 ?0005 12/16/21 ?0041 12/17/21 ?0037  ?NA 120*   < > 115*   < > 120* 124* 124*  ?K 3.9   < > 3.3*   < > 3.5 3.5 3.2*  ?CL 83*   < > 80*   < > 82* 83* 81*  ?  CO2 24   < > 25   < > 29 29 33*  ?GLUCOSE 95   < > 116*   < > 102* 106* 108*  ?BUN 16   < > 17   < > '22 20 18  '$ ?CREATININE 1.07*   < > 0.88   < > 0.88 0.97 0.88  ?CALCIUM 8.9   < > 8.0*   < > 8.1* 8.3* 8.6*  ?MG 1.6*  --   --   --   --   --  1.5*  ?PROT  --   --  6.1*  --   --   --   --   ?ALBUMIN  --    < > 3.1*   < > 2.9* 2.8* 2.9*  ?AST  --   --  22  --   --   --   --   ?ALT  --   --  20  --   --   --   --   ?ALKPHOS  --   --  11  --   --   --   --   ?BILITOT  --   --  0.6  --   --   --   --   ?GFRNONAA 50*   < > >60   < > >60 56* >60  ?ANIONGAP 13   < > 10   < > '9 12 10  '$ ? < > = values in this interval not displayed.  ?  ?Lipids No results for input(s): CHOL, TRIG, HDL, LABVLDL, LDLCALC, CHOLHDL in the last 168 hours.  ?Hematology ?Recent Labs  ?Lab 12/11/21 ?5277  ?WBC 13.3*  ?RBC 4.42  ?HGB 12.2  ?HCT 35.5*  ?MCV 80.3  ?MCH 27.6  ?MCHC 34.4  ?RDW 13.2  ?PLT 362  ? ?Thyroid  ?No results for input(s): TSH, FREET4 in the last 168 hours.  ?BNP ?Recent Labs  ?Lab 12/11/21 ?1405  ?BNP 733.6*  ?  ?DDimer  ?No results for input(s): DDIMER in the last 168 hours.  ? ?Radiology  ?  ?No results found. ? ?Cardiac  Studies  ? ?Echo 03/04/16 ?- Left ventricle: The cavity size was normal. Wall thickness was  ?  normal. Systolic function was normal. The estimated ejection  ?  fraction was in the range of 50% to 55%.  ?- Aortic valve: Calcified non coronary cusp. There was trivial  ?  regurgitation.  ?- Mitral valve: There was mild regurgitation.  ?- Left atrium: The atrium was mildly dilated.  ?- Atrial septum: No defect or patent foramen ovale was identified.  ? ?Assessment & Plan  ?  ?  ? ?Atrial fibrillation - persistent  s/p DCCV back in afib this am check ECG start oral amiodarone  ? ?CHF acute/chronic continue entresto and demadex improved  ? ?Cardiomyopathy  no plans for cath see above ? Rate related from rapid afib  ? ?Hyponatremia -- thought 2/2 dilution in setting of HF ? SIADH component improving slowly 124 Consider Tolvaptin if remains at this level tomorrow ? ?Sinus bradycardia Toprol dose decreased 12/15/21 by Dr Caryl Comes  ? ?Hypothyroidism - iatrogenic TSH high, T4 high T3 low  ? ? ?For questions or updates, please contact [place] ?Please consult [email or web address] for contact info under  ? ?  ?   ?Signed, ?Jenkins Rouge, MD  ?12/17/2021, 8:47 AM   ? ?

## 2021-12-18 DIAGNOSIS — I4891 Unspecified atrial fibrillation: Secondary | ICD-10-CM | POA: Diagnosis not present

## 2021-12-18 LAB — HEPATIC FUNCTION PANEL
ALT: 20 U/L (ref 0–44)
AST: 23 U/L (ref 15–41)
Albumin: 2.9 g/dL — ABNORMAL LOW (ref 3.5–5.0)
Alkaline Phosphatase: 71 U/L (ref 38–126)
Bilirubin, Direct: 0.1 mg/dL (ref 0.0–0.2)
Indirect Bilirubin: 0.5 mg/dL (ref 0.3–0.9)
Total Bilirubin: 0.6 mg/dL (ref 0.3–1.2)
Total Protein: 6 g/dL — ABNORMAL LOW (ref 6.5–8.1)

## 2021-12-18 LAB — OSMOLALITY: Osmolality: 264 mOsm/kg — ABNORMAL LOW (ref 275–295)

## 2021-12-18 LAB — RENAL FUNCTION PANEL
Albumin: 2.7 g/dL — ABNORMAL LOW (ref 3.5–5.0)
Anion gap: 11 (ref 5–15)
BUN: 24 mg/dL — ABNORMAL HIGH (ref 8–23)
CO2: 28 mmol/L (ref 22–32)
Calcium: 8.5 mg/dL — ABNORMAL LOW (ref 8.9–10.3)
Chloride: 83 mmol/L — ABNORMAL LOW (ref 98–111)
Creatinine, Ser: 0.94 mg/dL (ref 0.44–1.00)
GFR, Estimated: 58 mL/min — ABNORMAL LOW (ref >60)
Glucose, Bld: 114 mg/dL — ABNORMAL HIGH (ref 70–99)
Phosphorus: 3.4 mg/dL (ref 2.5–4.6)
Potassium: 4.2 mmol/L (ref 3.5–5.1)
Sodium: 122 mmol/L — ABNORMAL LOW (ref 135–145)

## 2021-12-18 LAB — BASIC METABOLIC PANEL
Anion gap: 11 (ref 5–15)
BUN: 22 mg/dL (ref 8–23)
CO2: 31 mmol/L (ref 22–32)
Calcium: 9 mg/dL (ref 8.9–10.3)
Chloride: 81 mmol/L — ABNORMAL LOW (ref 98–111)
Creatinine, Ser: 0.99 mg/dL (ref 0.44–1.00)
GFR, Estimated: 55 mL/min — ABNORMAL LOW (ref >60)
Glucose, Bld: 98 mg/dL (ref 70–99)
Potassium: 4.3 mmol/L (ref 3.5–5.1)
Sodium: 123 mmol/L — ABNORMAL LOW (ref 135–145)

## 2021-12-18 LAB — SODIUM, URINE, RANDOM: Sodium, Ur: <10 mmol/L

## 2021-12-18 LAB — OSMOLALITY, URINE: Osmolality, Ur: 286 mOsm/kg — ABNORMAL LOW (ref 300–900)

## 2021-12-18 MED ORDER — SODIUM CHLORIDE 0.9 % IV BOLUS
500.0000 mL | Freq: Once | INTRAVENOUS | Status: AC
  Administered 2021-12-18: 500 mL via INTRAVENOUS

## 2021-12-18 NOTE — Progress Notes (Signed)
?PROGRESS NOTE ? ? ? ?[name]  IOX:[id number] DOB: 06-06-1931 DOA: 12/08/2021 ?PCP: Jani Gravel, MD  ?Narrative 89/F with history of hypertension dyslipidemia, mild AR, MR, hypothyroidism and GERD presented to the ED 3/5 with acute onset dyspnea.  Patient reported progressive shortness of breath and palpitations X 1 week ?-In the ED she was noted to be in A-fib HR- 120s, and CHF.  Chest x-ray noted bilateral interstitial thickening concerning for edema versus pneumonia in the left lower lobe ?-Started on diuretics, cardiology following, underwent TEE/DC cardioversion ?-Hospital course complicated by worsening hyponatremia, nephrology following as well ? ? ?Subjective: Feels okay, wondering when she can go home ? ?Assessment and Plan: ? ?Acute systolic CHF ?-echo with EF of 45% with moderate MR ?-Diuresed with IV Lasix earlier this admission, she is 10.6 L negative, now on torsemide 80 Mg daily -now euvolemic, see discussion below regarding-worsening hyponatremia ?  ?Atrial fibrillation with RVR ([place]) ?- New onset A-fib with heart rates into the 110s.  ?-CHA2DS2-VASc>3, continue Eliquis ?-Cardiology consulting, underwent TEE/DC cardioversion on 3/8 ?-Back in A-fib this morning, rate controlled ?-Continue p.o. metoprolol ? ? Hyponatremia ?-Likely multifactorial, secondary to volume overload, diuretics and likely component of SIADH too ?-Initially hypervolemic, clinically now appears euvolemic to slightly dry ?-Currently on torsemide, await nephrology input ?- fluoxetine discontinued last week, Synthroid dose increased last week based on elevated TSH as well ?-Monitor sodium closely ? ?SIRS (systemic inflammatory response syndrome) (HCC) ?-Resolved ?  ?Hypothyroidism ?Home medication regimen includes levothyroxine 150 to 225 mcg on alternating days, TSH elevated, will increase Synthroid dose to 200 mcg daily ?  ?Normocytic anemia ?-Stable ?  ?  ?DVT prophylaxis: Apixaban ?Code Status: Full code ?Family  Communication: Discussed patient in detail, no family at bedside ?Disposition Plan: Home likely 48 hours ?  ?  ? ?Consultants:  ?Cardiology, nephrology ? ?Procedures: TEE/DC cardioversion 3/8 ? ?Antimicrobials:  ? ? ?Objective: ?Vitals:  ? 12/18/21 0400 12/18/21 0416 12/18/21 0723 12/18/21 0803  ?BP: 113/67  107/61   ?Pulse: 86  96   ?Resp: 14  17   ?Temp: 97.6 ?F (36.4 ?C)     ?TempSrc: Oral     ?SpO2: 94%  92% 94%  ?Weight:  88.6 kg    ?Height:      ? ? ?Intake/Output Summary (Last 24 hours) at 12/18/2021 1028 ?Last data filed at 12/18/2021 0841 ?Gross per 24 hour  ?Intake 1012.97 ml  ?Output 900 ml  ?Net 112.97 ml  ? ?Filed Weights  ? 12/16/21 0453 12/17/21 0500 12/18/21 0416  ?Weight: 89.9 kg 88.6 kg 88.6 kg  ? ? ?Examination: ? ?General exam: Pleasant elderly female sitting up in bed, AAOx3, nondistressed ?HEENT: No JVD ?CVS: S1-S2, irregularly irregular rhythm ?Lungs: Decreased breath sounds bases ?Abdomen: Soft, nontender, bowel sounds present ?Extremities: No edema  ?Skin: No rashes ?Psychiatry: Judgement and insight appear normal. Mood & affect appropriate.  ? ? ? ?Data Reviewed:  ? ?CBC: ?No results for input(s): WBC, NEUTROABS, HGB, HCT, MCV, PLT in the last 168 hours. ?Basic Metabolic Panel: ?Recent Labs  ?Lab 12/12/21 ?0518 12/13/21 ?2683 12/14/21 ?4196 12/15/21 ?0005 12/16/21 ?0041 12/17/21 ?2229 12/18/21 ?0119  ?NA 120*   < > 116* 120* 124* 124* 122*  ?K 3.9   < > 3.9 3.5 3.5 3.2* 4.2  ?CL 83*   < > 80* 82* 83* 81* 83*  ?CO2 24   < > '23 29 29 '$ 33* 28  ?GLUCOSE 95   < > 106* 102* 106* 108*  114*  ?BUN 16   < > 28* '22 20 18 '$ 24*  ?CREATININE 1.07*   < > 0.89 0.88 0.97 0.88 0.94  ?CALCIUM 8.9   < > 8.4* 8.1* 8.3* 8.6* 8.5*  ?MG 1.6*  --   --   --   --  1.5*  --   ?PHOS  --    < > 4.1 3.8 3.4 3.4 3.4  ? < > = values in this interval not displayed.  ? ?GFR: ?Estimated Creatinine Clearance: 46.8 mL/min (by C-G formula based on SCr of 0.94 mg/dL). ?Liver Function Tests: ?Recent Labs  ?Lab 12/13/21 ?1731  12/14/21 ?6378 12/15/21 ?0005 12/16/21 ?0041 12/17/21 ?5885 12/18/21 ?0119 12/18/21 ?0732  ?AST 22  --   --   --   --   --  23  ?ALT 20  --   --   --   --   --  20  ?ALKPHOS 80  --   --   --   --   --  71  ?BILITOT 0.6  --   --   --   --   --  0.6  ?PROT 6.1*  --   --   --   --   --  6.0*  ?ALBUMIN 3.1*   < > 2.9* 2.8* 2.9* 2.7* 2.9*  ? < > = values in this interval not displayed.  ? ?No results for input(s): LIPASE, AMYLASE in the last 168 hours. ?No results for input(s): AMMONIA in the last 168 hours. ?Coagulation Profile: ?No results for input(s): INR, PROTIME in the last 168 hours. ?Cardiac Enzymes: ?No results for input(s): CKTOTAL, CKMB, CKMBINDEX, TROPONINI in the last 168 hours. ?BNP (last 3 results) ?No results for input(s): PROBNP in the last 8760 hours. ?HbA1C: ?No results for input(s): HGBA1C in the last 72 hours. ?CBG: ?No results for input(s): GLUCAP in the last 168 hours. ?Lipid Profile: ?No results for input(s): CHOL, HDL, LDLCALC, TRIG, CHOLHDL, LDLDIRECT in the last 72 hours. ?Thyroid Function Tests: ?No results for input(s): TSH, T4TOTAL, FREET4, T3FREE, THYROIDAB in the last 72 hours. ?Anemia Panel: ?No results for input(s): VITAMINB12, FOLATE, FERRITIN, TIBC, IRON, RETICCTPCT in the last 72 hours. ?Urine analysis: ?   ?Component Value Date/Time  ? COLORURINE YELLOW 12/12/2021 1311  ? APPEARANCEUR CLEAR 12/12/2021 1311  ? LABSPEC 1.008 12/12/2021 1311  ? PHURINE 6.0 12/12/2021 1311  ? GLUCOSEU NEGATIVE 12/12/2021 1311  ? HGBUR NEGATIVE 12/12/2021 1311  ? BILIRUBINUR NEGATIVE 12/12/2021 1311  ? KETONESUR NEGATIVE 12/12/2021 1311  ? PROTEINUR NEGATIVE 12/12/2021 1311  ? NITRITE NEGATIVE 12/12/2021 1311  ? LEUKOCYTESUR NEGATIVE 12/12/2021 1311  ? ?Sepsis Labs: ?'@LABRCNTIP'$ (procalcitonin:4,lacticidven:4) ? ?) ?Recent Results (from the past 240 hour(s))  ?Blood culture (routine x 2)     Status: None  ? Collection Time: 12/08/21 11:07 AM  ? Specimen: BLOOD  ?Result Value Ref Range Status  ? Specimen  Description   Final  ?  BLOOD BLOOD RIGHT FOREARM ?Performed at [facility], [address] [place], [place] [address] ?  ? Special Requests   Final  ?  BOTTLES DRAWN AEROBIC AND ANAEROBIC Blood Culture adequate volume ?Performed at [facility], [address] [place], [place] [address] ?  ? Culture   Final  ?  NO GROWTH 5 DAYS ?Performed at [facility], [place] [address] [place],  [address] ?  ? Report Status 12/13/2021 FINAL  Final  ?Resp Panel by RT-PCR (Flu A&B, Covid) Nasopharyngeal Swab  Status: None  ? Collection Time: 12/08/21 12:05 PM  ? Specimen: Nasopharyngeal Swab; Nasopharyngeal(NP) swabs in vial transport medium  ?Result Value Ref Range Status  ? SARS Coronavirus 2 by RT PCR NEGATIVE NEGATIVE Final  ?  Comment: (NOTE) ?SARS-CoV-2 target nucleic acids are NOT DETECTED. ? ?The SARS-CoV-2 RNA is generally detectable in upper respiratory ?specimens during the acute phase of infection. The lowest ?concentration of SARS-CoV-2 viral copies this assay can detect is ?138 copies/mL. A negative result does not preclude SARS-Cov-2 ?infection and should not be used as the sole basis for treatment or ?other patient management decisions. A negative result may occur with  ?improper specimen collection/handling, submission of specimen other ?than nasopharyngeal swab, presence of viral mutation(s) within the ?areas targeted by this assay, and inadequate number of viral ?copies(<138 copies/mL). A negative result must be combined with ?clinical observations, patient history, and epidemiological ?information. The expected result is Negative. ? ?Fact Sheet for Patients:  ?EntrepreneurPulse.com.au ? ?Fact Sheet for Healthcare Providers:  ?IncredibleEmployment.be ? ?This test is no t yet approved or cleared by the [place] FDA and  ?has been authorized for detection and/or diagnosis of SARS-CoV-2 by ?FDA under an Emergency Use  Authorization (EUA). This EUA will remain  ?in effect (meaning this test can be used) for the duration of the ?COVID-19 declaration under Section 564(b)(1) of the Act, 21 ?U.S.C.section 360bbb-3(b)(1), unless the authoriza

## 2021-12-18 NOTE — Progress Notes (Signed)
Nutrition Follow-up ? ?DOCUMENTATION CODES:  ? ?Not applicable ? ?INTERVENTION:  ? ?- Continue Ensure Enlive po BID, each supplement provides 350 kcal and 20 grams of protein ? ?- Continue Regular diet with fluid restriction ? ?- Encourage PO intake ? ?NUTRITION DIAGNOSIS:  ? ?Increased nutrient needs related to chronic illness (CHF) as evidenced by estimated needs. ? ?Ongoing, being addressed via oral nutrition supplements ? ?GOAL:  ? ?Patient will meet greater than or equal to 90% of their needs ? ?Progressing ? ?MONITOR:  ? ?PO intake, Weight trends, Supplement acceptance, I & O's ? ?REASON FOR ASSESSMENT:  ? ?Consult ?Assessment of nutrition requirement/status ? ?ASSESSMENT:  ? ?88 y.o. female with hx of HTN, HLD, GERD and hypothyroidism, presented to ED with worsening SOB. Pt found to be in acute CHF exacerbation and with new onset atrial fibrillation. ? ?3/08 - s/p TEE, findings of EF of 45% ? ?Attempted to speak with pt at bedside. However, lab in room to obtain labs. Revisited pt later in the afternoon and pt not available. Unable to obtain diet and weight history or NFPE. ? ?Pt with fair to good PO intake of 30-100% x last 8 meals. Most meal completions have been >/=60%. Pt accepting most Ensure supplements per [facility] documentation. Will continue with current supplement regimen. ? ?Admit weight: 94.3 kg ?Current weight: 88.6 kg ? ?Pt with mild pitting edema to BLE. ? ?Meal Completion: 30-100% x last 8 meals ? ?Medications reviewed and include: Ensure Enlive BID, miralax, klor-con 20 mEq daily, senna, thiamine ? ?Labs reviewed: sodium 122, BUN 24, magnesium 1.5 on 3/14 ? ?UOP: 900 ml x 24 hours ?I/O's: -10.5 L since admit ? ?Diet Order:   ?Diet Order   ? ?       ?  Diet regular Room service appropriate? Yes; Fluid consistency: Thin; Fluid restriction: 1200 mL Fluid  Diet effective now       ?  ? ?  ?  ? ?  ? ? ?EDUCATION NEEDS:  ? ?No education needs have been identified at this time ? ?Skin:  Skin Assessment:  Reviewed RN Assessment (MASD to coccyx) ? ?Last BM:  12/18/21 ? ?Height:  ? ?Ht Readings from Last 1 Encounters:  ?12/08/21 5' 6.5" (1.689 m)  ? ? ?Weight:  ? ?Wt Readings from Last 1 Encounters:  ?12/18/21 88.6 kg  ? ? ?Ideal Body Weight:  60.2 kg ? ?BMI:  Body mass index is 31.05 kg/m?. ? ?Estimated Nutritional Needs:  ? ?Kcal:  1600-1800 kcal/day ? ?Protein:  80-90 g/day ? ?Fluid:  1.2-1.5 L/day ? ? ? ?Gustavus Bryant, MS, RD, LDN ?Inpatient Clinical Dietitian ?Please see AMiON for contact information. ? ?

## 2021-12-18 NOTE — Progress Notes (Addendum)
? ?Progress Note ? ?Patient Name: [name] ?Date of Encounter: 12/18/2021 ? ?[facility] Cardiologist: Oval Linsey ? ?Patient Profile  ?   ?88 y.o. female with hypertension, hyperlipidemia, and newly diagnosed atrial fibrillation >> TEE/DCCV.  12/11/21 Echo   EF 45 %  --MR mod  On anticoagulation with Apixaban  Reverted back to afib 3/14/ started on amiodarone  ? ? ? ?Subjective  ? ?Feeling stronger despite being back in afib Rate control ok  ?Has been walking  ? ?Inpatient Medications  ?  ?Scheduled Meds: ? apixaban  5 mg Oral BID  ? feeding supplement  237 mL Oral BID BM  ? levothyroxine  200 mcg Oral Q0600  ? mouth rinse  15 mL Mouth Rinse BID  ? metoprolol succinate  50 mg Oral Daily  ? mometasone-formoterol  2 puff Inhalation BID  ? polyethylene glycol  17 g Oral BID  ? potassium chloride  20 mEq Oral Daily  ? pravastatin  40 mg Oral q1800  ? sacubitril-valsartan  1 tablet Oral BID  ? senna-docusate  2 tablet Oral BID  ? sodium chloride flush  3 mL Intravenous Q12H  ? thiamine  125 mg Oral QPM  ? ?Continuous Infusions: ? sodium chloride Stopped (12/16/21 1900)  ? ?PRN Meds: ?sodium chloride, acetaminophen, albuterol, fluticasone, LORazepam, ondansetron (ZOFRAN) IV, sodium chloride flush  ? ?Vital Signs  ?  ?Vitals:  ? 12/18/21 0400 12/18/21 0416 12/18/21 0723 12/18/21 0803  ?BP: 113/67  107/61   ?Pulse: 86  96   ?Resp: 14  17   ?Temp: 97.6 ?F (36.4 ?C)     ?TempSrc: Oral     ?SpO2: 94%  92% 94%  ?Weight:  88.6 kg    ?Height:      ? ? ?Intake/Output Summary (Last 24 hours) at 12/18/2021 0938 ?Last data filed at 12/18/2021 0841 ?Gross per 24 hour  ?Intake 1012.97 ml  ?Output 900 ml  ?Net 112.97 ml  ? ?Last 3 Weights 12/18/2021 12/17/2021 12/16/2021  ?Weight (lbs) 195 lb 5.2 oz 195 lb 5.2 oz 198 lb 3.1 oz  ?Weight (kg) 88.6 kg 88.6 kg 89.9 kg  ?   ? ?Telemetry  ?  ?Afib rates 80-100 bpm ? ?  ? ?Physical Exam  ? ?VS:  BP 107/61 (BP Location: Right Arm)   Pulse 96   Temp 97.6 ?F (36.4 ?C) (Oral)   Resp 17   Ht  5' 6.5" (1.689 m)   Wt 88.6 kg   SpO2 94%   BMI 31.05 kg/m?  , BMI Body mass index is 31.05 kg/m?. ?  ?Well developed and nourished in no acute distress ?HENT normal ?Neck supple  ?Basilar crackles  ?Regular rate and rhythm, no murmurs or gallops ?Abd-soft  ?No Clubbing cyanosis tr edema ?Skin-warm and dry ?A & Oriented  Grossly normal sensory and motor function ?  ? ?Labs  ?  ?High Sensitivity Troponin:   ?Recent Labs  ?Lab 12/08/21 ?1158 12/12/21 ?0518  ?TROPONINIHS 6 10  ?   ?Chemistry ?Recent Labs  ?Lab 12/12/21 ?0518 12/13/21 ?2297 12/13/21 ?1731 12/14/21 ?9892 12/16/21 ?0041 12/17/21 ?1194 12/18/21 ?0119 12/18/21 ?0732  ?NA 120*   < > 115*   < > 124* 124* 122*  --   ?K 3.9   < > 3.3*   < > 3.5 3.2* 4.2  --   ?CL 83*   < > 80*   < > 83* 81* 83*  --   ?CO2 24   < > 25   < >  29 33* 28  --   ?GLUCOSE 95   < > 116*   < > 106* 108* 114*  --   ?BUN 16   < > 17   < > 20 18 24*  --   ?CREATININE 1.07*   < > 0.88   < > 0.97 0.88 0.94  --   ?CALCIUM 8.9   < > 8.0*   < > 8.3* 8.6* 8.5*  --   ?MG 1.6*  --   --   --   --  1.5*  --   --   ?PROT  --   --  6.1*  --   --   --   --  6.0*  ?ALBUMIN  --    < > 3.1*   < > 2.8* 2.9* 2.7* 2.9*  ?AST  --   --  22  --   --   --   --  23  ?ALT  --   --  20  --   --   --   --  20  ?ALKPHOS  --   --  80  --   --   --   --  71  ?BILITOT  --   --  0.6  --   --   --   --  0.6  ?GFRNONAA 50*   < > >60   < > 56* >60 58*  --   ?ANIONGAP 13   < > 10   < > '12 10 11  '$ --   ? < > = values in this interval not displayed.  ?  ?Lipids No results for input(s): CHOL, TRIG, HDL, LABVLDL, LDLCALC, CHOLHDL in the last 168 hours.  ?Hematology ?No results for input(s): WBC, RBC, HGB, HCT, MCV, MCH, MCHC, RDW, PLT in the last 168 hours. ? ?Thyroid  ?No results for input(s): TSH, FREET4 in the last 168 hours.  ?BNP ?Recent Labs  ?Lab 12/11/21 ?1405  ?BNP 733.6*  ?  ?DDimer  ?No results for input(s): DDIMER in the last 168 hours.  ? ?Radiology  ?  ?No results found. ? ?Cardiac Studies  ? ?Echo 03/04/16 ?- Left  ventricle: The cavity size was normal. Wall thickness was  ?  normal. Systolic function was normal. The estimated ejection  ?  fraction was in the range of 50% to 55%.  ?- Aortic valve: Calcified non coronary cusp. There was trivial  ?  regurgitation.  ?- Mitral valve: There was mild regurgitation.  ?- Left atrium: The atrium was mildly dilated.  ?- Atrial septum: No defect or patent foramen ovale was identified.  ? ?Assessment & Plan  ?  ?  ? ?Atrial fibrillation - persistent  s/p DCCV back in afib rate control ok on amiodarone now consider repeat [place] in 3 weeks  ? ?CHF acute/chronic continue entresto and demadex improved  ? ?Cardiomyopathy  no plans for cath see above ? Rate related from rapid afib  ? ?Hyponatremia -- thought 2/2 dilution in setting of HF ? SIADH component Back down to 122 Cannot order Tolvaptan order set indicates needs trial of demeclocycline before using  ? ?Sinus bradycardia Toprol dose decreased 12/15/21 by Dr Caryl Comes  ? ?Hypothyroidism - iatrogenic TSH high, T4 high T3 low  ? ? ?For questions or updates, please contact [place] ?Please consult [email or web address] for contact info under  ? ?  ?   ?Signed, ?Jenkins Rouge, MD  ?12/18/2021, 9:38 AM   ? ?

## 2021-12-18 NOTE — Progress Notes (Signed)
RT note. ?Patient currently on 2 L [place] sat 94%, v60 in patients room if needed later. Bipap not needed at this time. RT will continue to monitor. ?

## 2021-12-18 NOTE — Progress Notes (Signed)
Occupational Therapy Treatment ?Patient Details ?Name: [name] ?MRN: [id number] ?DOB: 03-Feb-1931 ?Today's Date: 12/18/2021 ? ? ?History of present illness Patient is a 88 y/o female who presents on 12/08/21 with SOB. Found to have new onset A-fib and acute respiratory distress secondary to CHF exacerbation and SIRS. CXR-bilateral interstitial thickening concerning for edema versus pneumonia in the left lower lobe. S/p cardioversion 3/8. PMH includes HTN and RBBB., mild AR/MR. ?  ?OT comments ? Patient received in supine and agreeable to OT treatment. Patient was min assist and increased time to get to EOB and to transfer to recliner with RW. Patient declined grooming tasks but performed LB dressing seated in chair. Patient performed sit to stands and addressed standing tolerance from recliner with patient tolerating ~2 minutes on 2 stands. Acute OT to continue to follow.   ? ?Recommendations for follow up therapy are one component of a multi-disciplinary discharge planning process, led by the attending physician.  Recommendations may be updated based on patient status, additional functional criteria and insurance authorization. ?   ?Follow Up Recommendations ? Home health OT  ?  ?Assistance Recommended at Discharge Intermittent Supervision/Assistance  ?Patient can return home with the following ? A little help with walking and/or transfers;A little help with bathing/dressing/bathroom;Assistance with cooking/housework ?  ?Equipment Recommendations ? None recommended by OT  ?  ?Recommendations for Other Services   ? ?  ?Precautions / Restrictions Precautions ?Precautions: Fall;Other (comment) ?Precaution Comments: watch HR/02; urinary incontinence (lasix) ?Restrictions ?Weight Bearing Restrictions: No  ? ? ?  ? ?Mobility Bed Mobility ?Overal bed mobility: Needs Assistance ?Bed Mobility: Supine to Sit ?  ?Sidelying to sit: Min assist, HOB elevated ?  ?  ?  ?General bed mobility comments: min assist to raise  trunk ?  ? ?Transfers ?Overall transfer level: Needs assistance ?Equipment used: Rolling walker (2 wheels) ?Transfers: Sit to/from Stand ?Sit to Stand: Min assist ?  ?  ?  ?  ?  ?General transfer comment: min assist to stand and to transfer to recliner with RW ?  ?  ?Balance Overall balance assessment: Needs assistance ?Sitting-balance support: Feet supported, No upper extremity supported ?Sitting balance-Leahy Scale: Good ?  ?  ?Standing balance support: During functional activity, Reliant on assistive device for balance ?Standing balance-Leahy Scale: Poor ?Standing balance comment: performed 2 stands from recliner to address standing tolerance with min guard for balance and patient tolerating ~2 minutes of standing ?  ?  ?  ?  ?  ?  ?  ?  ?  ?  ?  ?   ? ?ADL either performed or assessed with clinical judgement  ? ?ADL Overall ADL's : Needs assistance/impaired ?  ?  ?  ?  ?  ?  ?  ?  ?  ?  ?Lower Body Dressing: Minimal assistance;Sit to/from stand ?Lower Body Dressing Details (indicate cue type and reason): changed socks with min assist for left sock ?Toilet Transfer: Minimal assistance ?Toilet Transfer Details (indicate cue type and reason): simulated with transfer from EOB to recliner ?  ?  ?  ?  ?  ?General ADL Comments: LB dressing performed seated on recliner ?  ? ?Extremity/Trunk Assessment   ?  ?  ?  ?  ?  ? ?Vision   ?  ?  ?Perception   ?  ?Praxis   ?  ? ?Cognition Arousal/Alertness: Awake/alert ?Behavior During Therapy: [facility] for tasks assessed/performed ?Overall Cognitive Status: Within Functional Limits for tasks assessed ?  ?  ?  ?  ?  ?  ?  ?  ?  ?  ?  ?  ?  ?  ?  ?  ?  General Comments: verbal cues for safety with transfer ?  ?  ?   ?Exercises   ? ?  ?Shoulder Instructions   ? ? ?  ?General Comments    ? ? ?Pertinent Vitals/ Pain       Pain Assessment ?Pain Assessment: Faces ?Faces Pain Scale: No hurt ?Pain Intervention(s): Monitored during session ? ?Home Living   ?  ?  ?  ?  ?  ?  ?  ?  ?  ?  ?  ?  ?  ?   ?  ?  ?  ?  ? ?  ?Prior Functioning/Environment    ?  ?  ?  ?   ? ?Frequency ? Min 2X/week  ? ? ? ? ?  ?Progress Toward Goals ? ?OT Goals(current goals can now be found in the care plan section) ? Progress towards OT goals: Progressing toward goals ? ?Acute Rehab OT Goals ?Patient Stated Goal: go home ?OT Goal Formulation: With patient ?Time For Goal Achievement: 12/26/21 ?Potential to Achieve Goals: Good ?ADL Goals ?Pt Will Perform Grooming: with modified independence;standing ?Pt Will Perform Lower Body Bathing: with modified independence;sit to/from stand ?Pt Will Perform Lower Body Dressing: with modified independence;sit to/from stand ?Pt Will Perform Tub/Shower Transfer: with modified independence;Shower transfer;ambulating  ?Plan Discharge plan remains appropriate   ? ?Co-evaluation ? ? ?   ?  ?  ?  ?  ? ?  ?AM-PAC OT "6 Clicks" Daily Activity     ?Outcome Measure ? ? Help from another person eating meals?: None ?Help from another person taking care of personal grooming?: None ?Help from another person toileting, which includes using toliet, bedpan, or urinal?: A Little ?Help from another person bathing (including washing, rinsing, drying)?: A Little ?Help from another person to put on and taking off regular upper body clothing?: A Little ?Help from another person to put on and taking off regular lower body clothing?: A Little ?6 Click Score: 20 ? ?  ?End of Session Equipment Utilized During Treatment: Rolling walker (2 wheels);Oxygen ? ?OT Visit Diagnosis: Unsteadiness on feet (R26.81);Muscle weakness (generalized) (M62.81) ?  ?Activity Tolerance Patient tolerated treatment well ?  ?Patient Left in chair;with call bell/phone within reach;with chair alarm set ?  ?Nurse Communication Mobility status ?  ? ?   ? ?Time: 8315-1761 ?OT Time Calculation (min): 30 min ? ?Charges: OT General Charges ?$OT Visit: 1 Visit ?OT Treatments ?$Self Care/Home Management : 8-22 mins ?$Therapeutic Activity: 8-22 mins ? ?Lodema Hong, OTA ?Acute Rehabilitation Services  ?Pager [phone] ?Office [phone] ? ? ?[place] ?12/18/2021, 11:02 AM ?

## 2021-12-18 NOTE — Progress Notes (Signed)
?[facility] ?Progress Note  ? ?Subjective:  Generally feeling ok; no HA vision changes.  Feels a bit 'dry'.  Ate little for dinner but is having protein shakes and ate good breakfast with eggs, cheese, potato, fruit.   ? ?I/Os: 0.9 / 0.9 all UOP ?Wt 88.6kg from 88.6 kg yesterday, 94.3kg from admission ? ?Objective ?Vitals:  ? 12/17/21 2347 12/18/21 0400 12/18/21 0416 12/18/21 0723  ?BP: (!) 109/59 113/67  107/61  ?Pulse: 80 86  96  ?Resp: '16 14  17  '$ ?Temp: 98 ?F (36.7 ?C) 97.6 ?F (36.4 ?C)    ?TempSrc: Oral Oral    ?SpO2: 100% 94%  92%  ?Weight:   88.6 kg   ?Height:      ? ?Physical Exam ?General: elderly woman sitting alert in bed ?Heart: rrr ?Lungs: clear ant and laterally, normal WOB with conversation ?Abdomen: soft, nontender ?Extremities: improved thigh edema - really no edema now ?Neuro: nonfocal, conversant and oriented ? ?Additional Objective ?Labs: ?Basic Metabolic Panel: ?Recent Labs  ?Lab 12/16/21 ?0041 12/17/21 ?7672 12/18/21 ?0119  ?NA 124* 124* 122*  ?K 3.5 3.2* 4.2  ?CL 83* 81* 83*  ?CO2 29 33* 28  ?GLUCOSE 106* 108* 114*  ?BUN 20 18 24*  ?CREATININE 0.97 0.88 0.94  ?CALCIUM 8.3* 8.6* 8.5*  ?PHOS 3.4 3.4 3.4  ? ? ?Liver Function Tests: ?Recent Labs  ?Lab 12/13/21 ?1731 12/14/21 ?0947 12/16/21 ?0041 12/17/21 ?0962 12/18/21 ?0119  ?AST 22  --   --   --   --   ?ALT 20  --   --   --   --   ?ALKPHOS 80  --   --   --   --   ?BILITOT 0.6  --   --   --   --   ?PROT 6.1*  --   --   --   --   ?ALBUMIN 3.1*   < > 2.8* 2.9* 2.7*  ? < > = values in this interval not displayed.  ? ? ?No results for input(s): LIPASE, AMYLASE in the last 168 hours. ?CBC: ?No results for input(s): WBC, NEUTROABS, HGB, HCT, MCV, PLT in the last 168 hours. ? ?Blood Culture ?   ?Component Value Date/Time  ? SDES  12/08/2021 1205  ?  BLOOD LEFT ANTECUBITAL ?Performed at [facility], [address] [place], [place] [address] ?  ? SPECREQUEST  12/08/2021 1205  ?  BOTTLES DRAWN AEROBIC AND ANAEROBIC  Blood Culture adequate volume ?Performed at [facility], [address], [place], [place] [address] ?  ? CULT  12/08/2021 1205  ?  NO GROWTH 5 DAYS ?Performed at [facility], [place] [address], [place],  [address] ?  ? REPTSTATUS 12/13/2021 FINAL 12/08/2021 1205  ? ? ?Cardiac Enzymes: ?No results for input(s): CKTOTAL, CKMB, CKMBINDEX, TROPONINI in the last 168 hours. ?CBG: ?No results for input(s): GLUCAP in the last 168 hours. ?Iron Studies: No results for input(s): IRON, TIBC, TRANSFERRIN, FERRITIN in the last 72 hours. ?'@lablastinr3'$ @ ?Studies/Results: ?No results found. ?Medications: ? sodium chloride Stopped (12/16/21 1900)  ? ? apixaban  5 mg Oral BID  ? feeding supplement  237 mL Oral BID BM  ? levothyroxine  200 mcg Oral Q0600  ? mouth rinse  15 mL Mouth Rinse BID  ? metoprolol succinate  50 mg Oral Daily  ? mometasone-formoterol  2 puff Inhalation BID  ? polyethylene glycol  17 g Oral BID  ? potassium chloride  20 mEq  Oral Daily  ? pravastatin  40 mg Oral q1800  ? sacubitril-valsartan  1 tablet Oral BID  ? senna-docusate  2 tablet Oral BID  ? sodium chloride flush  3 mL Intravenous Q12H  ? thiamine  125 mg Oral QPM  ? torsemide  80 mg Oral Daily  ? ? ?Assessment/Plan: ?**hyponatremia:  hypervolemic.  Initially quite hypervolemic and serum sodium improved with loop induced diuresis.  Today serum sodium has again declined and based on lower than typical BPs, fairly net negative status of ~10.6L for admission I think she's become a bit hypovolemic. Will give 0.9% NS 0.5L this AM over 4 hrs and recheck serum sodium this PM.   Also concern for component of poor solute intake; cont BID supplemental nutritional shakes.  On regular diet with 1298m fluid restriction at this time - will continue fluid restriction despite IV today as this is going to be an ongoing restriction at home.  Cont to follow closely.  ? ?**HFrEF: EF 45% with mod MR.   On BB and entresto.  Cardiology following.    ? ?**A fib:  s/p DCCV, on eliquis. On BB.  Cardiology following.  ? ?**HTN:  BP low to normal.   ? ?**hypokalemia: on KCL 20 daily, additional supplement PRN.  ? ?PT recommending home with HHPT. ? ?Will follow, call with concerns.   ?Remain inpt for now pending improvement in sodium.  ? ?LJannifer HickMD ?12/18/2021, 7:56 AM  ?[facility] ?Pager: [phone]? ? ?

## 2021-12-19 DIAGNOSIS — I4891 Unspecified atrial fibrillation: Secondary | ICD-10-CM | POA: Diagnosis not present

## 2021-12-19 DIAGNOSIS — I5021 Acute systolic (congestive) heart failure: Secondary | ICD-10-CM | POA: Diagnosis not present

## 2021-12-19 LAB — RENAL FUNCTION PANEL
Albumin: 2.8 g/dL — ABNORMAL LOW (ref 3.5–5.0)
Anion gap: 11 (ref 5–15)
BUN: 22 mg/dL (ref 8–23)
CO2: 26 mmol/L (ref 22–32)
Calcium: 8.8 mg/dL — ABNORMAL LOW (ref 8.9–10.3)
Chloride: 84 mmol/L — ABNORMAL LOW (ref 98–111)
Creatinine, Ser: 0.89 mg/dL (ref 0.44–1.00)
GFR, Estimated: >60 mL/min (ref >60)
Glucose, Bld: 103 mg/dL — ABNORMAL HIGH (ref 70–99)
Phosphorus: 3.5 mg/dL (ref 2.5–4.6)
Potassium: 4.1 mmol/L (ref 3.5–5.1)
Sodium: 121 mmol/L — ABNORMAL LOW (ref 135–145)

## 2021-12-19 LAB — CBC
HCT: 38.9 % (ref 36.0–46.0)
Hemoglobin: 13.1 g/dL (ref 12.0–15.0)
MCH: 27.3 pg (ref 26.0–34.0)
MCHC: 33.7 g/dL (ref 30.0–36.0)
MCV: 81.2 fL (ref 80.0–100.0)
Platelets: 333 10*3/uL (ref 150–400)
RBC: 4.79 MIL/uL (ref 3.87–5.11)
RDW: 13.3 % (ref 11.5–15.5)
WBC: 11.3 10*3/uL — ABNORMAL HIGH (ref 4.0–10.5)
nRBC: 0 % (ref 0.0–0.2)

## 2021-12-19 LAB — SODIUM: Sodium: 123 mmol/L — ABNORMAL LOW (ref 135–145)

## 2021-12-19 MED ORDER — TOLVAPTAN 15 MG PO TABS
15.0000 mg | ORAL_TABLET | Freq: Once | ORAL | Status: AC
  Administered 2021-12-19: 15 mg via ORAL
  Filled 2021-12-19: qty 1

## 2021-12-19 NOTE — Progress Notes (Signed)
Physical Therapy Treatment ?Patient Details ?Name: [name] ?MRN: [id number] ?DOB: [birth year] ?Today's Date: 12/19/2021 ? ? ?History of Present Illness Patient is a 88 y/o female who presents on 12/08/21 with SOB. Found to have new onset A-fib and acute respiratory distress secondary to CHF exacerbation and SIRS. CXR-bilateral interstitial thickening concerning for edema versus pneumonia in the left lower lobe. S/p cardioversion 3/8. PMH includes HTN and RBBB., mild AR/MR. ? ?  ?PT Comments  ? ? Pt received in recliner, agreeable to therapy session and with good participation and tolerance for transfer and gait training. Pt able to progress gait distance to short household distance with chair follow for safety and able to indicate when she is fatigued for activity pacing/safety. Pt able to stand up to 2 minutes with Supervision for static tasks but needs min guard for safety with dynamic standing tasks. She needs min to modA for transfers and bed mobility. Pt continues to benefit from PT services to progress toward functional mobility goals.    ?Recommendations for follow up therapy are one component of a multi-disciplinary discharge planning process, led by the attending physician.  Recommendations may be updated based on patient status, additional functional criteria and insurance authorization. ? ?Follow Up Recommendations ? Home health PT ?  ?  ?Assistance Recommended at Discharge Intermittent Supervision/Assistance  ?Patient can return home with the following Help with stairs or ramp for entrance;A little help with walking and/or transfers;A little help with bathing/dressing/bathroom;Assistance with cooking/housework ?  ?Equipment Recommendations ? None recommended by PT  ?  ?Recommendations for Other Services   ? ? ?  ?Precautions / Restrictions Precautions ?Precautions: Fall;Other (comment) ?Precaution Comments: watch HR/02; urinary incontinence (lasix) ?Restrictions ?Weight Bearing Restrictions: No  ?   ? ?Mobility ? Bed Mobility ?Overal bed mobility: Needs Assistance ?Bed Mobility: Rolling, Sit to Sidelying ?Rolling: Min guard ?  ?  ?  ?Sit to sidelying: Min assist ?General bed mobility comments: min assist to get BLE up onto bed, pt unable to perform unassisted; cues for log roll to prevent her R wrist getting caught in rail ?  ? ?Transfers ?Overall transfer level: Needs assistance ?Equipment used: Rolling walker (2 wheels) ?Transfers: Sit to/from Stand ?Sit to Stand: Min assist, Mod assist ?  ?  ?  ?  ?  ?General transfer comment: initially needing modA for stability upon standing and pt unable to maintain upright at RW needing to sit. MinA second/third attempts from recliner with use of momentum strategy. ?  ? ?Ambulation/Gait ?Ambulation/Gait assistance: Min guard, +2 safety/equipment ?Gait Distance (Feet): 60 Feet (93f, 689fwith seated break between) ?Assistive device: Rolling walker (2 wheels) ?Gait Pattern/deviations: Step-through pattern, Decreased stride length, Trunk flexed ?Gait velocity: decreased ?Gait velocity interpretation: <1.31 ft/sec, indicative of household ambulator ?  ?General Gait Details: Cues for upward gaze, min guard for safety, no overt LOB. Chair follow for safety due to pt fatigue; single seated break needed; SpO2 WFL on 1L O2 [place] (mostly 91-94%), briefly desat to <88% but with cues for upright posture/loosening grip on AD, SpO2 reading improves so anticipate this was more due to noisy signal. ? ? ?Stairs ?  ?  ?  ?  ?  ? ? ?Wheelchair Mobility ?  ? ?Modified Rankin (Stroke Patients Only) ?  ? ? ?  ?Balance Overall balance assessment: Needs assistance ?Sitting-balance support: Feet supported, No upper extremity supported ?Sitting balance-Leahy Scale: Good ?  ?  ?Standing balance support: During functional activity, Reliant on assistive device for balance ?  Standing balance-Leahy Scale: Poor ?Standing balance comment: pt able to stand at [facility] with supervision for ~2 mins but needs min  guard for safety with dynamic standing tasks; chair follow for safety ?  ?  ?  ?  ?  ?  ?  ?  ?  ?  ?  ?  ? ?  ?Cognition Arousal/Alertness: Awake/alert ?Behavior During Therapy: [facility] for tasks assessed/performed ?Overall Cognitive Status: Within Functional Limits for tasks assessed ?  ?  ?  ?  ?  ?  ?  ?  ?  ?  ?  ?  ?  ?  ?  ?  ?General Comments: Verbal cues for safety with transfer with decreased carryover wtihin session, pt initially unable to state month until granddaughter gave her a hint as to recent holiday; pt oriented to self/location and year. Mild STM deficits noted. ?  ?  ? ?  ?Exercises General Exercises - Lower Extremity ?Ankle Circles/Pumps: Both, Seated, AROM, 10 reps ? ?  ?General Comments General comments (skin integrity, edema, etc.): see gait comments ?  ?  ? ?Pertinent Vitals/Pain Pain Assessment ?Pain Assessment: No/denies pain ?Faces Pain Scale: No hurt ?Pain Intervention(s): Monitored during session, Repositioned  ? ? ?Home Living   ?  ?  ?  ?  ?  ?  ?  ?  ?  ?   ?  ?Prior Function    ?  ?  ?   ? ?PT Goals (current goals can now be found in the care plan section) Acute Rehab PT Goals ?Patient Stated Goal: to go home ?PT Goal Formulation: With patient ?Time For Goal Achievement: 12/23/21 ?Progress towards PT goals: Progressing toward goals ? ?  ?Frequency ? ? ? Min 3X/week ? ? ? ?  ?PT Plan Current plan remains appropriate  ? ? ?Co-evaluation   ?  ?  ?  ?  ? ?  ?AM-PAC PT "6 Clicks" Mobility   ?Outcome Measure ? Help needed turning from your back to your side while in a flat bed without using bedrails?: A Little ?Help needed moving from lying on your back to sitting on the side of a flat bed without using bedrails?: A Lot ?Help needed moving to and from a bed to a chair (including a wheelchair)?: A Little ?Help needed standing up from a chair using your arms (e.g., wheelchair or bedside chair)?: A Lot ?Help needed to walk in hospital room?: A Little ?Help needed climbing 3-5 steps with a  railing? : A Lot ?6 Click Score: 15 ? ?  ?End of Session Equipment Utilized During Treatment: Gait belt;Oxygen ?Activity Tolerance: Patient tolerated treatment well ?Patient left: in bed;with call bell/phone within reach;with family/visitor present;with bed alarm set (pt heels floated) ?Nurse Communication: Mobility status ?PT Visit Diagnosis: Muscle weakness (generalized) (M62.81);Difficulty in walking, not elsewhere classified (R26.2) ?  ? ? ?Time: 5868-2574 ?PT Time Calculation (min) (ACUTE ONLY): 31 min ? ?Charges:  $Gait Training: 8-22 mins ?$Therapeutic Activity: 8-22 mins          ?          ? ?Jorita Bohanon P., PTA ?Acute Rehabilitation Services ?Pager: [phone] ?Office: [phone]  ? ? ?Bela Bonaparte M Ava Tangney ?12/19/2021, 3:06 PM ? ?

## 2021-12-19 NOTE — Progress Notes (Signed)
RT note. Patient not requiring bipap at this time. Patient on 1 L [place] sat 94%. RT will continue to monitor ?

## 2021-12-19 NOTE — Progress Notes (Signed)
? ?Progress Note ? ?Patient Name: [name] ?Date of Encounter: 12/19/2021 ? ?[facility] Cardiologist: Oval Linsey ? ?Patient Profile  ?   ?88 y.o. female with hypertension, hyperlipidemia, and newly diagnosed atrial fibrillation >> TEE/DCCV.  12/11/21 Echo   EF 45 %  --MR mod  On anticoagulation with Apixaban  Reverted back to afib 3/14/ started on amiodarone  ? ? ? ?Subjective  ? ?Ambulating diuretics held yesterday  ? ?Inpatient Medications  ?  ?Scheduled Meds: ? apixaban  5 mg Oral BID  ? feeding supplement  237 mL Oral BID BM  ? levothyroxine  200 mcg Oral Q0600  ? mouth rinse  15 mL Mouth Rinse BID  ? metoprolol succinate  50 mg Oral Daily  ? mometasone-formoterol  2 puff Inhalation BID  ? polyethylene glycol  17 g Oral BID  ? potassium chloride  20 mEq Oral Daily  ? pravastatin  40 mg Oral q1800  ? sacubitril-valsartan  1 tablet Oral BID  ? senna-docusate  2 tablet Oral BID  ? sodium chloride flush  3 mL Intravenous Q12H  ? thiamine  125 mg Oral QPM  ? tolvaptan  15 mg Oral Once  ? ?Continuous Infusions: ? sodium chloride Stopped (12/16/21 1900)  ? ?PRN Meds: ?sodium chloride, acetaminophen, albuterol, fluticasone, LORazepam, ondansetron (ZOFRAN) IV, sodium chloride flush  ? ?Vital Signs  ?  ?Vitals:  ? 12/18/21 2340 12/19/21 0340 12/19/21 0401 12/19/21 0809  ?BP: 125/70 103/66  (!) 123/44  ?Pulse: 81 89 84 87  ?Resp: 18 (!) 22 18   ?Temp: 97.7 ?F (36.5 ?C) 98.1 ?F (36.7 ?C)    ?TempSrc: Oral Oral    ?SpO2: 93% 95% 92%   ?Weight:   90.6 kg   ?Height:      ? ? ?Intake/Output Summary (Last 24 hours) at 12/19/2021 0840 ?Last data filed at 12/19/2021 0458 ?Gross per 24 hour  ?Intake 843 ml  ?Output 950 ml  ?Net -107 ml  ? ?Last 3 Weights 12/19/2021 12/18/2021 12/17/2021  ?Weight (lbs) 199 lb 11.8 oz 195 lb 5.2 oz 195 lb 5.2 oz  ?Weight (kg) 90.6 kg 88.6 kg 88.6 kg  ?   ? ?Telemetry  ?  ?Afib rates 80-100 bpm ? ?  ? ?Physical Exam  ? ?VS:  BP (!) 123/44   Pulse 87   Temp 98.1 ?F (36.7 ?C) (Oral)   Resp 18    Ht 5' 6.5" (1.689 m)   Wt 90.6 kg   SpO2 92%   BMI 31.76 kg/m?  , BMI Body mass index is 31.76 kg/m?. ?  ?Well developed and nourished in no acute distress ?HENT normal ?Neck supple  ?Basilar crackles  ?Regular rate and rhythm, no murmurs or gallops ?Abd-soft  ?No Clubbing cyanosis tr edema ?Skin-warm and dry ?A & Oriented  Grossly normal sensory and motor function ?  ? ?Labs  ?  ?High Sensitivity Troponin:   ?Recent Labs  ?Lab 12/08/21 ?1158 12/12/21 ?0518  ?TROPONINIHS 6 10  ?   ?Chemistry ?Recent Labs  ?Lab 12/13/21 ?1731 12/14/21 ?3235 12/17/21 ?0037 12/18/21 ?0119 12/18/21 ?0732 12/18/21 ?0830 12/19/21 ?0130  ?NA 115*   < > 124* 122*  --  123* 121*  ?K 3.3*   < > 3.2* 4.2  --  4.3 4.1  ?CL 80*   < > 81* 83*  --  81* 84*  ?CO2 25   < > 33* 28  --  31 26  ?GLUCOSE 116*   < > 108* 114*  --  98 103*  ?BUN 17   < > 18 24*  --  22 22  ?CREATININE 0.88   < > 0.88 0.94  --  0.99 0.89  ?CALCIUM 8.0*   < > 8.6* 8.5*  --  9.0 8.8*  ?MG  --   --  1.5*  --   --   --   --   ?PROT 6.1*  --   --   --  6.0*  --   --   ?ALBUMIN 3.1*   < > 2.9* 2.7* 2.9*  --  2.8*  ?AST 22  --   --   --  23  --   --   ?ALT 20  --   --   --  20  --   --   ?ALKPHOS 80  --   --   --  71  --   --   ?BILITOT 0.6  --   --   --  0.6  --   --   ?GFRNONAA >60   < > >60 58*  --  55* >60  ?ANIONGAP 10   < > 10 11  --  11 11  ? < > = values in this interval not displayed.  ?  ?Lipids No results for input(s): CHOL, TRIG, HDL, LABVLDL, LDLCALC, CHOLHDL in the last 168 hours.  ?Hematology ?Recent Labs  ?Lab 12/19/21 ?0130  ?WBC 11.3*  ?RBC 4.79  ?HGB 13.1  ?HCT 38.9  ?MCV 81.2  ?MCH 27.3  ?MCHC 33.7  ?RDW 13.3  ?PLT 333  ? ? ?Thyroid  ?No results for input(s): TSH, FREET4 in the last 168 hours.  ?BNP ?No results for input(s): BNP, PROBNP in the last 168 hours. ?  ?DDimer  ?No results for input(s): DDIMER in the last 168 hours.  ? ?Radiology  ?  ?No results found. ? ?Cardiac Studies  ? ?Echo 03/04/16 ?- Left ventricle: The cavity size was normal. Wall  thickness was  ?  normal. Systolic function was normal. The estimated ejection  ?  fraction was in the range of 50% to 55%.  ?- Aortic valve: Calcified non coronary cusp. There was trivial  ?  regurgitation.  ?- Mitral valve: There was mild regurgitation.  ?- Left atrium: The atrium was mildly dilated.  ?- Atrial septum: No defect or patent foramen ovale was identified.  ? ?Assessment & Plan  ?  ?  ? ?Atrial fibrillation - persistent  s/p DCCV back in afib rate control ok on amiodarone now consider repeat [place] in 3 weeks  ? ?CHF acute/chronic continue entresto and demadex improved  ? ?Cardiomyopathy  no plans for cath see above ? Rate related from rapid afib Diuretic held yesterday will need to resume prior to d/c  ? ?Hyponatremia -- slow to improve despite improvement in volume/CHF Renal started Tovaptin yesterday But Na 121 early this am Likely contributing to her MS changes Urine sodium 10 does not suggest SIADH ? ?Sinus bradycardia Toprol dose decreased 12/15/21 by Dr Caryl Comes  ? ?Hypothyroidism - iatrogenic TSH high, T4 high T3 low  ? ? ?For questions or updates, please contact Rosa Sanchez ?Please consult [email or web address] for contact info under  ? ?  ?   ?Signed, ?Jenkins Rouge, MD  ?12/19/2021, 8:40 AM   ? ?

## 2021-12-19 NOTE — Progress Notes (Signed)
?[facility] ?Progress Note  ? ?Subjective:  Generally feeling ok; no HA vision changes.  Says some mild dyspnea this AM.   ? ?I/Os: 0.85 / 0.9 all UOP ?Wt 90.6kg from 88.6 kg yesterday, 94.3kg from admission ? ?Objective ?Vitals:  ? 12/18/21 2100 12/18/21 2340 12/19/21 0340 12/19/21 0401  ?BP: 115/69 125/70 103/66   ?Pulse: 84 81 89 84  ?Resp: 18 18 (!) 22 18  ?Temp: 97.7 ?F (36.5 ?C) 97.7 ?F (36.5 ?C) 98.1 ?F (36.7 ?C)   ?TempSrc: Oral Oral Oral   ?SpO2: 95% 93% 95% 92%  ?Weight:    90.6 kg  ?Height:      ? ?Physical Exam ?General: elderly woman sitting alert in bed ?Heart: rrr ?Lungs: clear ant and laterally, normal WOB with conversation ?Abdomen: soft, nontender ?Extremities: trace edema ?Neuro: nonfocal, conversant and oriented ? ?Additional Objective ?Labs: ?Basic Metabolic Panel: ?Recent Labs  ?Lab 12/17/21 ?0037 12/18/21 ?0119 12/18/21 ?0830 12/19/21 ?0130  ?NA 124* 122* 123* 121*  ?K 3.2* 4.2 4.3 4.1  ?CL 81* 83* 81* 84*  ?CO2 33* '28 31 26  '$ ?GLUCOSE 108* 114* 98 103*  ?BUN 18 24* 22 22  ?CREATININE 0.88 0.94 0.99 0.89  ?CALCIUM 8.6* 8.5* 9.0 8.8*  ?PHOS 3.4 3.4  --  3.5  ? ? ?Liver Function Tests: ?Recent Labs  ?Lab 12/13/21 ?1731 12/14/21 ?7322 12/18/21 ?0119 12/18/21 ?0732 12/19/21 ?0130  ?AST 22  --   --  23  --   ?ALT 20  --   --  20  --   ?ALKPHOS 80  --   --  71  --   ?BILITOT 0.6  --   --  0.6  --   ?PROT 6.1*  --   --  6.0*  --   ?ALBUMIN 3.1*   < > 2.7* 2.9* 2.8*  ? < > = values in this interval not displayed.  ? ? ?No results for input(s): LIPASE, AMYLASE in the last 168 hours. ?CBC: ?Recent Labs  ?Lab 12/19/21 ?0130  ?WBC 11.3*  ?HGB 13.1  ?HCT 38.9  ?MCV 81.2  ?PLT 333  ? ? ?Blood Culture ?   ?Component Value Date/Time  ? SDES  12/08/2021 1205  ?  BLOOD LEFT ANTECUBITAL ?Performed at [facility], [address] [place], [place] [address] ?  ? SPECREQUEST  12/08/2021 1205  ?  BOTTLES DRAWN AEROBIC AND ANAEROBIC Blood Culture adequate volume ?Performed at [facility], [address], [place], [place] [address] ?  ? CULT  12/08/2021 1205  ?  NO GROWTH 5 DAYS ?Performed at [facility], [place] [address], [place], [place] [address] ?  ? REPTSTATUS 12/13/2021 FINAL 12/08/2021 1205  ? ? ?Cardiac Enzymes: ?No results for input(s): CKTOTAL, CKMB, CKMBINDEX, TROPONINI in the last 168 hours. ?CBG: ?No results for input(s): GLUCAP in the last 168 hours. ?Iron Studies: No results for input(s): IRON, TIBC, TRANSFERRIN, FERRITIN in the last 72 hours. ?'@lablastinr3'$ @ ?Studies/Results: ?No results found. ?Medications: ? sodium chloride Stopped (12/16/21 1900)  ? ? apixaban  5 mg Oral BID  ? feeding supplement  237 mL Oral BID BM  ? levothyroxine  200 mcg Oral Q0600  ? mouth rinse  15 mL Mouth Rinse BID  ? metoprolol succinate  50 mg Oral Daily  ? mometasone-formoterol  2 puff Inhalation BID  ? polyethylene glycol  17 g Oral BID  ? potassium chloride  20 mEq Oral Daily  ? pravastatin  40 mg Oral  q1800  ? sacubitril-valsartan  1 tablet Oral BID  ? senna-docusate  2 tablet Oral BID  ? sodium chloride flush  3 mL Intravenous Q12H  ? thiamine  125 mg Oral QPM  ? ? ?Assessment/Plan: ?**hyponatremia:  hypervolemic.  Initially quite hypervolemic and serum sodium improved with loop induced diuresis.  Yesterday thought overdiuresed:  held diuretic, gave fluid challenge, however serum sodium declined further.  Low urine sodium and osm > max dilute likely due to CHF.  Will dose with tolvaptan '15mg'$  daily.  Per protocol LFTs normal and q8h sodiums will be checked; on strict I/Os. Also concern for component of poor solute intake; cont BID supplemental nutritional shakes.   Loosen up fluid restriction to 2066m today given vaptan but doesn't need to drink this much unless thirst.  Cont to follow closely.  ? ?**HFrEF: EF 45% with mod MR.   On BB and entresto.  Cardiology following.   ? ?**A fib:  s/p DCCV but back in A fib. On eliquis. On BB and amiodarone. Cardiology following.   ? ?**HTN:  normotensive.  ? ?**hypokalemia: on KCL 20 daily, additional supplement PRN.  ? ?PT recommending home with HHPT. ? ?Will follow, call with concerns.   ?Remain inpt for now pending improvement in sodium.  ? ?LJannifer HickMD ?12/19/2021, 8:07 AM  ?[facility] ?Pager: [phone]? ? ?

## 2021-12-19 NOTE — Progress Notes (Signed)
?PROGRESS NOTE ? ? ? ?JASNEET SCHOBERT  WVP:[id number] DOB: 1931-05-20 DOA: 12/08/2021 ?PCP: Jani Gravel, MD  ?Narrative 89/F with history of hypertension dyslipidemia, mild AR, MR, hypothyroidism and GERD presented to the ED 3/5 with acute onset dyspnea.  Patient reported progressive shortness of breath and palpitations X 1 week ?-In the ED she was noted to be in A-fib HR- 120s, and CHF.  Chest x-ray noted bilateral interstitial thickening concerning for edema versus pneumonia in the left lower lobe ?-Started on diuretics, cardiology following, underwent TEE/DC cardioversion ?-Hospital course complicated by worsening hyponatremia, nephrology following as well ? ? ?Subjective: Feels okay, denies any complaints, hoping to go home soon ? ?Assessment and Plan: ? ?Acute systolic CHF ?-echo with EF of 45% with moderate MR ?-Diuresed with IV Lasix earlier this admission, she is 10.6 L negative, transition to oral torsemide, now on hold with hyponatremia ?-Clinically appears euvolemic, plan to start tolvaptan for severe hyponatremia ? ?Atrial fibrillation with RVR ([place]) ?- New onset A-fib  ?-CHA2DS2-VASc>3, continue Eliquis ?-Cardiology consulting, underwent TEE/DC cardioversion on 3/8 ?-Back in A-fib, rate controlled ?-Continue p.o. metoprolol ? ? Hyponatremia ?-Likely multifactorial, secondary to volume overload, diuretics and likely component of SIADH too ?-Initially hypervolemic, clinically now appears euvolemic to slightly dry ?-Torsemide on hold, given IV fluids yesterday X 500 mL total ?-Plan to start tolvaptan today, monitor sodium closely ?- fluoxetine discontinued last week, Synthroid dose increased last week based on elevated TSH as well ?-Monitor sodium closely ? ?SIRS (systemic inflammatory response syndrome) (HCC) ?-Resolved ?  ?Hypothyroidism ?Home medication regimen includes levothyroxine 150 to 225 mcg on alternating days, TSH elevated, will increase Synthroid dose to 200 mcg daily ?  ?Normocytic  anemia ?-Stable ?  ?  ?DVT prophylaxis: Apixaban ?Code Status: Full code ?Family Communication: Discussed patient in detail, no family at bedside ?Disposition Plan: Home likely 48 hours ?  ?  ? ?Consultants:  ?Cardiology, nephrology ? ?Procedures: TEE/DC cardioversion 3/8 ? ?Antimicrobials:  ? ? ?Objective: ?Vitals:  ? 12/18/21 2340 12/19/21 0340 12/19/21 0401 12/19/21 0809  ?BP: 125/70 103/66  (!) 123/44  ?Pulse: 81 89 84 87  ?Resp: 18 (!) 22 18   ?Temp: 97.7 ?F (36.5 ?C) 98.1 ?F (36.7 ?C)    ?TempSrc: Oral Oral    ?SpO2: 93% 95% 92%   ?Weight:   90.6 kg   ?Height:      ? ? ?Intake/Output Summary (Last 24 hours) at 12/19/2021 1123 ?Last data filed at 12/19/2021 0458 ?Gross per 24 hour  ?Intake 723 ml  ?Output 950 ml  ?Net -227 ml  ? ?Filed Weights  ? 12/17/21 0500 12/18/21 0416 12/19/21 0401  ?Weight: 88.6 kg 88.6 kg 90.6 kg  ? ? ?Examination: ? ?General exam: Pleasant elderly female sitting up in bed, AAOx3, nondistressed ?HEENT: No JVD ?CVS: S1-S2, irregularly irregular rhythm ?Lungs: Decreased breath sounds bases ?Abdomen: Soft, nontender, bowel sounds present ?Extremities: No edema  ?Skin: No rashes ?Psychiatry: Judgement and insight appear normal. Mood & affect appropriate.  ? ?Data Reviewed:  ? ?CBC: ?Recent Labs  ?Lab 12/19/21 ?0130  ?WBC 11.3*  ?HGB 13.1  ?HCT 38.9  ?MCV 81.2  ?PLT 333  ? ?Basic Metabolic Panel: ?Recent Labs  ?Lab 12/15/21 ?0005 12/16/21 ?0041 12/17/21 ?0037 12/18/21 ?0119 12/18/21 ?0830 12/19/21 ?0130  ?NA 120* 124* 124* 122* 123* 121*  ?K 3.5 3.5 3.2* 4.2 4.3 4.1  ?CL 82* 83* 81* 83* 81* 84*  ?CO2 29 29 33* '28 31 26  '$ ?GLUCOSE 102* 106* 108* 114*  98 103*  ?BUN '22 20 18 '$ 24* 22 22  ?CREATININE 0.88 0.97 0.88 0.94 0.99 0.89  ?CALCIUM 8.1* 8.3* 8.6* 8.5* 9.0 8.8*  ?MG  --   --  1.5*  --   --   --   ?PHOS 3.8 3.4 3.4 3.4  --  3.5  ? ?GFR: ?Estimated Creatinine Clearance: 50 mL/min (by C-G formula based on SCr of 0.89 mg/dL). ?Liver Function Tests: ?Recent Labs  ?Lab 12/13/21 ?1731 12/14/21 ?5176  12/16/21 ?0041 12/17/21 ?0037 12/18/21 ?0119 12/18/21 ?0732 12/19/21 ?0130  ?AST 22  --   --   --   --  23  --   ?ALT 20  --   --   --   --  20  --   ?ALKPHOS 80  --   --   --   --  71  --   ?BILITOT 0.6  --   --   --   --  0.6  --   ?PROT 6.1*  --   --   --   --  6.0*  --   ?ALBUMIN 3.1*   < > 2.8* 2.9* 2.7* 2.9* 2.8*  ? < > = values in this interval not displayed.  ? ?No results for input(s): LIPASE, AMYLASE in the last 168 hours. ?No results for input(s): AMMONIA in the last 168 hours. ?Coagulation Profile: ?No results for input(s): INR, PROTIME in the last 168 hours. ?Cardiac Enzymes: ?No results for input(s): CKTOTAL, CKMB, CKMBINDEX, TROPONINI in the last 168 hours. ?BNP (last 3 results) ?No results for input(s): PROBNP in the last 8760 hours. ?HbA1C: ?No results for input(s): HGBA1C in the last 72 hours. ?CBG: ?No results for input(s): GLUCAP in the last 168 hours. ?Lipid Profile: ?No results for input(s): CHOL, HDL, LDLCALC, TRIG, CHOLHDL, LDLDIRECT in the last 72 hours. ?Thyroid Function Tests: ?No results for input(s): TSH, T4TOTAL, FREET4, T3FREE, THYROIDAB in the last 72 hours. ?Anemia Panel: ?No results for input(s): VITAMINB12, FOLATE, FERRITIN, TIBC, IRON, RETICCTPCT in the last 72 hours. ?Urine analysis: ?   ?Component Value Date/Time  ? COLORURINE YELLOW 12/12/2021 1311  ? APPEARANCEUR CLEAR 12/12/2021 1311  ? LABSPEC 1.008 12/12/2021 1311  ? PHURINE 6.0 12/12/2021 1311  ? GLUCOSEU NEGATIVE 12/12/2021 1311  ? HGBUR NEGATIVE 12/12/2021 1311  ? BILIRUBINUR NEGATIVE 12/12/2021 1311  ? KETONESUR NEGATIVE 12/12/2021 1311  ? PROTEINUR NEGATIVE 12/12/2021 1311  ? NITRITE NEGATIVE 12/12/2021 1311  ? LEUKOCYTESUR NEGATIVE 12/12/2021 1311  ? ?Sepsis Labs: ?'@LABRCNTIP'$ (procalcitonin:4,lacticidven:4) ? ?) ?No results found for this or any previous visit (from the past 240 hour(s)). ?  ? ?Radiology Studies: ?No results found. ? ? ?Scheduled Meds: ? apixaban  5 mg Oral BID  ? feeding supplement  237 mL Oral BID BM   ? levothyroxine  200 mcg Oral Q0600  ? mouth rinse  15 mL Mouth Rinse BID  ? metoprolol succinate  50 mg Oral Daily  ? mometasone-formoterol  2 puff Inhalation BID  ? polyethylene glycol  17 g Oral BID  ? potassium chloride  20 mEq Oral Daily  ? pravastatin  40 mg Oral q1800  ? sacubitril-valsartan  1 tablet Oral BID  ? senna-docusate  2 tablet Oral BID  ? sodium chloride flush  3 mL Intravenous Q12H  ? thiamine  125 mg Oral QPM  ? ?Continuous Infusions: ? sodium chloride Stopped (12/16/21 1900)  ? ? ? LOS: 11 days  ? ? ?Time spent: 65mn ? ?PDomenic Polite MD ?[facility] ? ? ?  12/19/2021, 11:23 AM  ?  ?

## 2021-12-19 NOTE — Progress Notes (Signed)
Mobility Specialist Progress Note  ? ? 12/19/21 1619  ?Mobility  ?Activity Transferred to/from [facility]  ?Level of Assistance Minimal assist, patient does 75% or more  ?Assistive Device Front wheel walker  ?Distance Ambulated (ft) 4 ft  ?Activity Response Tolerated fair  ?$Mobility charge 1 Mobility  ? ?Pt received in bed and agreeable. Had BM. Returned to bed with call bell in reach.  ? ?Hildred Alamin ?Mobility Specialist  ?M.S. 5N: [phone]  ?

## 2021-12-20 DIAGNOSIS — I5021 Acute systolic (congestive) heart failure: Secondary | ICD-10-CM | POA: Diagnosis not present

## 2021-12-20 DIAGNOSIS — I4891 Unspecified atrial fibrillation: Secondary | ICD-10-CM | POA: Diagnosis not present

## 2021-12-20 LAB — RENAL FUNCTION PANEL
Albumin: 2.8 g/dL — ABNORMAL LOW (ref 3.5–5.0)
Anion gap: 8 (ref 5–15)
BUN: 18 mg/dL (ref 8–23)
CO2: 29 mmol/L (ref 22–32)
Calcium: 8.9 mg/dL (ref 8.9–10.3)
Chloride: 89 mmol/L — ABNORMAL LOW (ref 98–111)
Creatinine, Ser: 1.05 mg/dL — ABNORMAL HIGH (ref 0.44–1.00)
GFR, Estimated: 51 mL/min — ABNORMAL LOW (ref >60)
Glucose, Bld: 117 mg/dL — ABNORMAL HIGH (ref 70–99)
Phosphorus: 4.2 mg/dL (ref 2.5–4.6)
Potassium: 4.2 mmol/L (ref 3.5–5.1)
Sodium: 126 mmol/L — ABNORMAL LOW (ref 135–145)

## 2021-12-20 LAB — SODIUM: Sodium: 128 mmol/L — ABNORMAL LOW (ref 135–145)

## 2021-12-20 MED ORDER — TOLVAPTAN 15 MG PO TABS
15.0000 mg | ORAL_TABLET | Freq: Once | ORAL | Status: AC
  Administered 2021-12-20: 15 mg via ORAL
  Filled 2021-12-20: qty 1

## 2021-12-20 MED ORDER — CAMPHOR-MENTHOL 0.5-0.5 % EX LOTN
TOPICAL_LOTION | CUTANEOUS | Status: DC | PRN
  Filled 2021-12-20: qty 222

## 2021-12-20 NOTE — Progress Notes (Signed)
Physical Therapy Treatment ?Patient Details ?Name: [name] ?MRN: [id number] ?DOB: 18-Dec-1930 ?Today's Date: 12/20/2021 ? ? ?History of Present Illness Patient is a 88 y/o female who presents on 12/08/21 with SOB. Found to have new onset A-fib and acute respiratory distress secondary to CHF exacerbation and SIRS. CXR-bilateral interstitial thickening concerning for edema versus pneumonia in the left lower lobe. S/p cardioversion 3/8. PMH includes HTN and RBBB., mild AR/MR. ? ?  ?PT Comments  ? ? Pt received in recliner, c/o discomfort and agreeable to therapy session. Pt noted to have area of redness and rash-like bumps on upper back region and pt c/o itchy feeling, RN notified, lotion applied. Pt able to perform gait trial in room ~76f with RW and min guard with increased cues needed for backward steps navigating into narrow bathroom space. Pt needing minA and 2 trials to stand fully upright from recliner. Pt gait distance limited due to need for BM and pt needing >10 mins in bathroom. Pt able to demo back need for call bell and RN/mobility tech notified. Pt continues to benefit from PT services to progress toward functional mobility goals.   ?Recommendations for follow up therapy are one component of a multi-disciplinary discharge planning process, led by the attending physician.  Recommendations may be updated based on patient status, additional functional criteria and insurance authorization. ? ?Follow Up Recommendations ? Home health PT ?  ?  ?Assistance Recommended at Discharge Intermittent Supervision/Assistance  ?Patient can return home with the following Help with stairs or ramp for entrance;A little help with walking and/or transfers;A little help with bathing/dressing/bathroom;Assistance with cooking/housework ?  ?Equipment Recommendations ? None recommended by PT  ?  ?Recommendations for Other Services   ? ? ?  ?Precautions / Restrictions Precautions ?Precautions: Fall;Other (comment) ?Precaution  Comments: watch HR/02; urinary incontinence ?Restrictions ?Weight Bearing Restrictions: No  ?  ? ?Mobility ?  ? ?Transfers ?Overall transfer level: Needs assistance ?Equipment used: Rolling walker (2 wheels) ?Transfers: Sit to/from Stand ?Sit to Stand: Min assist ?  ?  ?  ?  ?  ?General transfer comment: MinA from recliner and to toilet heights ?  ? ?Ambulation/Gait ?Ambulation/Gait assistance: Min guard ?Gait Distance (Feet): 15 Feet ?Assistive device: Rolling walker (2 wheels) ?Gait Pattern/deviations: Step-through pattern, Decreased stride length, Trunk flexed ?Gait velocity: decreased ?  ?  ?General Gait Details: Cues for upward gaze, min guard for safety, no overt LOB. SpO2 noisy signal, WFL on RA once seated and not gripping RW on RA ? ? ?  ?Balance Overall balance assessment: Needs assistance ?Sitting-balance support: Feet supported, No upper extremity supported ?Sitting balance-Leahy Scale: Good ?  ?  ?Standing balance support: During functional activity, Reliant on assistive device for balance ?Standing balance-Leahy Scale: Poor ?Standing balance comment: [facility] but needs min guard for safety with dynamic standing tasks ?  ?  ?  ? ?  ?Cognition Arousal/Alertness: Awake/alert ?Behavior During Therapy: [facility]for tasks assessed/performed ?Overall Cognitive Status: Within Functional Limits for tasks assessed ?  ?  ?  ?General Comments: Verbal cues for safety with transfer with poor carryover wtihin session, mild STM deficits noted. ?  ?  ? ?  ?Exercises   ? ?  ?General Comments General comments (skin integrity, edema, etc.): VSS on RA, poor signal on finger gripping RW ?  ?  ? ?Pertinent Vitals/Pain Pain Assessment ?Pain Assessment: Faces ?Faces Pain Scale: Hurts little more ?Pain Location: LBP, itching on upper back (RN  notified) ?Pain Descriptors / Indicators: Discomfort ?Pain Intervention(s): Monitored during session, Repositioned  ? ? ?Home Living Family/patient expects  to be discharged to:: Private residence ?Living Arrangements: Spouse/significant other ?  ?  ?  ?  ?  ?  ?  ?  ?   ?  ?   ? ?PT Goals (current goals can now be found in the care plan section) Acute Rehab PT Goals ?Patient Stated Goal: to go home ?PT Goal Formulation: With patient ?Time For Goal Achievement: 12/23/21 ?Progress towards PT goals: Progressing toward goals ? ?  ?Frequency ? ? ? Min 3X/week ? ? ? ?  ?PT Plan Current plan remains appropriate  ? ? ?AM-PAC PT "6 Clicks" Mobility   ?Outcome Measure ? Help needed turning from your back to your side while in a flat bed without using bedrails?: A Little ?Help needed moving from lying on your back to sitting on the side of a flat bed without using bedrails?: A Lot ?Help needed moving to and from a bed to a chair (including a wheelchair)?: A Little ?Help needed standing up from a chair using your arms (e.g., wheelchair or bedside chair)?: A Lot ?Help needed to walk in hospital room?: A Little ?Help needed climbing 3-5 steps with a railing? : A Lot ?6 Click Score: 15 ? ?  ?End of Session Equipment Utilized During Treatment: Gait belt ?Activity Tolerance: Patient tolerated treatment well ?Patient left: with call bell/phone within reach;in chair ?Nurse Communication: Mobility status ?PT Visit Diagnosis: Muscle weakness (generalized) (M62.81);Difficulty in walking, not elsewhere classified (R26.2) ?  ? ? ?Time: 1030-1051 ?PT Time Calculation (min) (ACUTE ONLY): 21 min ? ?Charges:  $Therapeutic Activity: 8-22 mins          ?          ? ?Cherita Hebel P., PTA ?Acute Rehabilitation Services ?Pager: [phone] ?Office: [phone]  ? ? ?Tiffany Talarico M Ravis Herne ?12/20/2021, 11:01 AM ? ?

## 2021-12-20 NOTE — Progress Notes (Signed)
? ?Progress Note ? ?Patient Name: [name] ?Date of Encounter: 12/20/2021 ? ?[facility] Cardiologist: Oval Linsey ? ?Patient Profile  ?   ?88 y.o. female with hypertension, hyperlipidemia, and newly diagnosed atrial fibrillation >> TEE/DCCV.  12/11/21 Echo   EF 45 %  --MR mod  On anticoagulation with Apixaban  Reverted back to afib 3/14/ started on amiodarone  ? ? ? ?Subjective  ? ?Still in hospital due to hyponatremia  ? ?Inpatient Medications  ?  ?Scheduled Meds: ? apixaban  5 mg Oral BID  ? feeding supplement  237 mL Oral BID BM  ? levothyroxine  200 mcg Oral Q0600  ? mouth rinse  15 mL Mouth Rinse BID  ? metoprolol succinate  50 mg Oral Daily  ? mometasone-formoterol  2 puff Inhalation BID  ? polyethylene glycol  17 g Oral BID  ? potassium chloride  20 mEq Oral Daily  ? pravastatin  40 mg Oral q1800  ? sacubitril-valsartan  1 tablet Oral BID  ? senna-docusate  2 tablet Oral BID  ? sodium chloride flush  3 mL Intravenous Q12H  ? thiamine  125 mg Oral QPM  ? tolvaptan  15 mg Oral Once  ? ?Continuous Infusions: ? sodium chloride Stopped (12/16/21 1900)  ? ?PRN Meds: ?sodium chloride, acetaminophen, albuterol, fluticasone, LORazepam, ondansetron (ZOFRAN) IV, sodium chloride flush  ? ?Vital Signs  ?  ?Vitals:  ? 12/19/21 2253 12/20/21 0130 12/20/21 0326 12/20/21 0519  ?BP: 93/62  109/73   ?Pulse: 94  94   ?Resp: '20 18 16   '$ ?Temp: 98.3 ?F (36.8 ?C)  98.1 ?F (36.7 ?C)   ?TempSrc: Oral  Oral   ?SpO2: 92% 92% 91%   ?Weight:    90.9 kg  ?Height:      ? ? ?Intake/Output Summary (Last 24 hours) at 12/20/2021 0748 ?Last data filed at 12/20/2021 0130 ?Gross per 24 hour  ?Intake 240 ml  ?Output 3350 ml  ?Net -3110 ml  ? ?Last 3 Weights 12/20/2021 12/19/2021 12/18/2021  ?Weight (lbs) 200 lb 6.4 oz 199 lb 11.8 oz 195 lb 5.2 oz  ?Weight (kg) 90.9 kg 90.6 kg 88.6 kg  ?   ? ?Telemetry  ?  ?Afib rates 80-100 bpm ? ?  ? ?Physical Exam  ? ?VS:  BP 109/73 (BP Location: Left Arm)   Pulse 94   Temp 98.1 ?F (36.7 ?C) (Oral)   Resp  16   Ht 5' 6.5" (1.689 m)   Wt 90.9 kg   SpO2 91%   BMI 31.86 kg/m?  , BMI Body mass index is 31.86 kg/m?. ?  ?Well developed and nourished in no acute distress ?HENT normal ?Neck supple  ?Basilar crackles  ?Regular rate and rhythm, no murmurs or gallops ?Abd-soft  ?No Clubbing cyanosis tr edema ?Skin-warm and dry ?A & Oriented  Grossly normal sensory and motor function ?  ? ?Labs  ?  ?High Sensitivity Troponin:   ?Recent Labs  ?Lab 12/08/21 ?1158 12/12/21 ?0518  ?TROPONINIHS 6 10  ?   ?Chemistry ?Recent Labs  ?Lab 12/13/21 ?1731 12/14/21 ?9024 12/17/21 ?0037 12/18/21 ?0119 12/18/21 ?0732 12/18/21 ?0830 12/19/21 ?0130 12/19/21 ?1903 12/20/21 ?0107  ?NA 115*   < > 124*   < >  --  123* 121* 123* 126*  ?K 3.3*   < > 3.2*   < >  --  4.3 4.1  --  4.2  ?CL 80*   < > 81*   < >  --  81* 84*  --  89*  ?CO2 25   < > 33*   < >  --  31 26  --  29  ?GLUCOSE 116*   < > 108*   < >  --  98 103*  --  117*  ?BUN 17   < > 18   < >  --  22 22  --  18  ?CREATININE 0.88   < > 0.88   < >  --  0.99 0.89  --  1.05*  ?CALCIUM 8.0*   < > 8.6*   < >  --  9.0 8.8*  --  8.9  ?MG  --   --  1.5*  --   --   --   --   --   --   ?PROT 6.1*  --   --   --  6.0*  --   --   --   --   ?ALBUMIN 3.1*   < > 2.9*   < > 2.9*  --  2.8*  --  2.8*  ?AST 22  --   --   --  23  --   --   --   --   ?ALT 20  --   --   --  20  --   --   --   --   ?ALKPHOS 80  --   --   --  71  --   --   --   --   ?BILITOT 0.6  --   --   --  0.6  --   --   --   --   ?GFRNONAA >60   < > >60   < >  --  55* >60  --  51*  ?ANIONGAP 10   < > 10   < >  --  11 11  --  8  ? < > = values in this interval not displayed.  ?  ?Lipids No results for input(s): CHOL, TRIG, HDL, LABVLDL, LDLCALC, CHOLHDL in the last 168 hours.  ?Hematology ?Recent Labs  ?Lab 12/19/21 ?0130  ?WBC 11.3*  ?RBC 4.79  ?HGB 13.1  ?HCT 38.9  ?MCV 81.2  ?MCH 27.3  ?MCHC 33.7  ?RDW 13.3  ?PLT 333  ? ? ?Thyroid  ?No results for input(s): TSH, FREET4 in the last 168 hours.  ?BNP ?No results for input(s): BNP, PROBNP in the  last 168 hours. ?  ?DDimer  ?No results for input(s): DDIMER in the last 168 hours.  ? ?Radiology  ?  ?No results found. ? ?Cardiac Studies  ? ?Echo 03/04/16 ?- Left ventricle: The cavity size was normal. Wall thickness was  ?  normal. Systolic function was normal. The estimated ejection  ?  fraction was in the range of 50% to 55%.  ?- Aortic valve: Calcified non coronary cusp. There was trivial  ?  regurgitation.  ?- Mitral valve: There was mild regurgitation.  ?- Left atrium: The atrium was mildly dilated.  ?- Atrial septum: No defect or patent foramen ovale was identified.  ? ?Assessment & Plan  ?  ?  ? ?Atrial fibrillation - persistent  s/p DCCV back in afib rate control ok on amiodarone now consider repeat [place] in 3 weeks  ? ?CHF acute/chronic continue entresto and demadex improved Diuretics held for low sodium and azotemia She is not volume overloaded now and hyponatremia persists Will need to be on some diuretic prior to d/c Suggest Lasix 20 mg daily Azotemia resolved  ? ?  Cardiomyopathy  no plans for cath see above ? Rate related from rapid afib See above regarding diuretic  ? ?Hyponatremia -- slow to improve despite improvement in volume/CHF Renal started Tovaptin  Na 126 today  Likely contributing to her MS changes Urine sodium 10 does not suggest SIADH ? ?Sinus bradycardia Toprol dose decreased 12/15/21 by Dr Caryl Comes  ? ?Hypothyroidism - iatrogenic TSH high, T4 high T3 low  ? ? ?For questions or updates, please contact [place] ?Please consult [email or web address] for contact info under  ? ?  ?   ?Signed, ?Jenkins Rouge, MD  ?12/20/2021, 7:48 AM   ? ?

## 2021-12-20 NOTE — Progress Notes (Signed)
?PROGRESS NOTE ? ? ? ?MAECYN PANNING  SWF:[id number] DOB: January 28, 1931 DOA: 12/08/2021 ?PCP: Jani Gravel, MD  ?Narrative 89/F with history of hypertension dyslipidemia, mild AR, MR, hypothyroidism and GERD presented to the ED 3/5 with acute onset dyspnea.  Patient reported progressive shortness of breath and palpitations X 1 week ?-In the ED she was noted to be in A-fib HR- 120s, and CHF.  Chest x-ray noted bilateral interstitial thickening concerning for edema versus pneumonia in the left lower lobe ?-Started on diuretics, cardiology following, underwent TEE/DC cardioversion ?-Hospital course complicated by worsening hyponatremia, nephrology following as well ? ? ?Subjective: Feels okay, denies any complaints, hoping to go home soon ? ?Assessment and Plan: ? ?Acute systolic CHF ?-echo with EF of 45% with moderate MR ?-Diuresed with IV Lasix earlier this admission, she is 10.6 L negative, transitioned to oral torsemide, now on hold with hyponatremia ?-Clinically appears euvolemic,  ?-Diuresed well with tolvaptan yesterday, dose decreased today, restart diuretics possibly tomorrow ?-Discharge planning ? ?Atrial fibrillation with RVR ([place]) ?- New onset A-fib  ?-CHA2DS2-VASc>3, continue Eliquis ?-Cardiology consulting, underwent TEE/DC cardioversion on 3/8 ?-Back in A-fib, rate controlled, per cards, plan to consider repeat cardioversion as outpatient ?-Continue p.o. metoprolol ? ? 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 ?SIRS (systemic inflammatory response syndrome)  (HCC) ?-Resolved ?  ?Hypothyroidism ?Home medication regimen includes levothyroxine 150 to 225 mcg on alternating days, TSH elevated, will increase Synthroid dose to 200 mcg daily ?  ?Normocytic anemia ?-Stable ?  ?  ?DVT prophylaxis: Apixaban ?Code Status: Full code ?Family Communication: Discussed patient in detail, no family at bedside ?Disposition Plan: Home likely 1 to 2 days ?  ?  ? ?Consultants:  ?Cardiology, nephrology ? ?Procedures: TEE/DC cardioversion 3/8 ? ?Antimicrobials:  ? ? ?Objective: ?Vitals:  ? 12/20/21 0519 12/20/21 0800 12/20/21 0824 12/20/21 1117  ?BP:  (!) 145/60  93/61  ?Pulse:  99  90  ?Resp:  17  20  ?Temp:  98.2 ?F (36.8 ?C)  98.3 ?F (36.8 ?C)  ?TempSrc:  Oral  Oral  ?SpO2:  91% 94% 95%  ?Weight: 90.9 kg     ?Height:      ? ? ?Intake/Output Summary (Last 24 hours) at 12/20/2021 1150 ?Last data filed at 12/20/2021 0800 ?Gross per 24 hour  ?Intake 480 ml  ?Output 3350 ml  ?Net -2870 ml  ? ?Filed Weights  ? 12/18/21 0416 12/19/21 0401 12/20/21 0519  ?Weight: 88.6 kg 90.6 kg 90.9 kg  ? ? ?Examination: ? ?General exam: Pleasant elderly female sitting up in bed, AAOx3, no distress ?HEENT: No JVD ?CVS: S1-S2, irregularly irregular rhythm ?Lungs: Decreased breath sounds bases ?Abdomen: Soft, nontender, bowel sounds present ?Extremities: No edema ?Skin: No rashes on exposed skin ?Psychiatry: Judgement and insight appear normal. Mood & affect appropriate.  ? ?Data Reviewed:  ? ?CBC: ?Recent Labs  ?Lab 12/19/21 ?0130  ?WBC 11.3*  ?HGB 13.1  ?HCT 38.9  ?MCV 81.2  ?PLT 333  ? ?Basic Metabolic Panel: ?Recent Labs  ?Lab 12/16/21 ?0041 12/17/21 ?0037 12/18/21 ?0119 12/18/21 ?0830 12/19/21 ?0130 12/19/21 ?1903 12/20/21 ?0107  ?NA 124* 124* 122* 123* 121* 123* 126*  ?  K 3.5 3.2* 4.2 4.3 4.1  --  4.2  ?CL 83* 81* 83* 81* 84*  --  89*  ?CO2 29 33* '28 31 26  '$ --  29  ?GLUCOSE 106* 108* 114* 98 103*  --  117*  ?BUN 20 18 24* 22 22  --  18  ?CREATININE 0.97 0.88 0.94 0.99 0.89  --  1.05*  ?CALCIUM 8.3* 8.6* 8.5* 9.0  8.8*  --  8.9  ?MG  --  1.5*  --   --   --   --   --   ?PHOS 3.4 3.4 3.4  --  3.5  --  4.2  ? ?GFR: ?Estimated Creatinine Clearance: 42.5 mL/min (A) (by C-G formula based on SCr of 1.05 mg/dL (H)). ?Liver Function Tests: ?Recent Labs  ?Lab 12/13/21 ?1731 12/14/21 ?5093 12/17/21 ?0037 12/18/21 ?0119 12/18/21 ?0732 12/19/21 ?0130 12/20/21 ?0107  ?AST 22  --   --   --  23  --   --   ?ALT 20  --   --   --  20  --   --   ?ALKPHOS 80  --   --   --  71  --   --   ?BILITOT 0.6  --   --   --  0.6  --   --   ?PROT 6.1*  --   --   --  6.0*  --   --   ?ALBUMIN 3.1*   < > 2.9* 2.7* 2.9* 2.8* 2.8*  ? < > = values in this interval not displayed.  ? ?No results for input(s): LIPASE, AMYLASE in the last 168 hours. ?No results for input(s): AMMONIA in the last 168 hours. ?Coagulation Profile: ?No results for input(s): INR, PROTIME in the last 168 hours. ?Cardiac Enzymes: ?No results for input(s): CKTOTAL, CKMB, CKMBINDEX, TROPONINI in the last 168 hours. ?BNP (last 3 results) ?No results for input(s): PROBNP in the last 8760 hours. ?HbA1C: ?No results for input(s): HGBA1C in the last 72 hours. ?CBG: ?No results for input(s): GLUCAP in the last 168 hours. ?Lipid Profile: ?No results for input(s): CHOL, HDL, LDLCALC, TRIG, CHOLHDL, LDLDIRECT in the last 72 hours. ?Thyroid Function Tests: ?No results for input(s): TSH, T4TOTAL, FREET4, T3FREE, THYROIDAB in the last 72 hours. ?Anemia Panel: ?No results for input(s): VITAMINB12, FOLATE, FERRITIN, TIBC, IRON, RETICCTPCT in the last 72 hours. ?Urine analysis: ?   ?Component Value Date/Time  ? COLORURINE YELLOW 12/12/2021 1311  ? APPEARANCEUR CLEAR 12/12/2021 1311  ? LABSPEC 1.008 12/12/2021 1311  ? PHURINE 6.0 12/12/2021 1311  ? GLUCOSEU NEGATIVE 12/12/2021 1311  ? HGBUR NEGATIVE 12/12/2021 1311  ? BILIRUBINUR NEGATIVE 12/12/2021 1311  ? KETONESUR NEGATIVE 12/12/2021 1311  ? PROTEINUR NEGATIVE 12/12/2021 1311  ? NITRITE NEGATIVE 12/12/2021 1311  ? LEUKOCYTESUR NEGATIVE 12/12/2021 1311   ? ?Sepsis Labs: ?'@LABRCNTIP'$ (procalcitonin:4,lacticidven:4) ? ?) ?No results found for this or any previous visit (from the past 240 hour(s)). ?  ? ?Radiology Studies: ?No results found. ? ? ?Scheduled Meds: ? apixaban  5 mg Oral BID  ? feeding supplement  237 mL Oral BID BM  ? levothyroxine  200 mcg Oral Q0600  ? mouth rinse  15 mL Mouth Rinse BID  ? metoprolol succinate  50 mg Oral Daily  ? mometasone-formoterol  2 puff Inhalation BID  ? polyethylene glycol  17 g Oral BID  ? potassium chloride  20 mEq Oral Daily  ? pravastatin  40 mg Oral q1800  ? sacubitril-valsartan  1 tablet Oral  BID  ? senna-docusate  2 tablet Oral BID  ? sodium chloride flush  3 mL Intravenous Q12H  ? thiamine  125 mg Oral QPM  ? ?Continuous Infusions: ? sodium chloride Stopped (12/16/21 1900)  ? ? ? LOS: 12 days  ? ? ?Time spent: 75mn ? ?PDomenic Polite MD ?[facility] ? ? ?12/20/2021, 11:50 AM  ?  ?

## 2021-12-20 NOTE — Progress Notes (Signed)
Patient in no respiratory distress at this time. Bipap not needed at this time,bipap is at bedside ? ?

## 2021-12-20 NOTE — Progress Notes (Signed)
Mobility Specialist Progress Note  ? ? 12/20/21 1117  ?Mobility  ?Activity Ambulated with assistance in room  ?Level of Assistance Minimal assist, patient does 75% or more  ?Assistive Device Front wheel walker  ?Distance Ambulated (ft) 30 ft  ?Activity Response Tolerated well  ?$Mobility charge 1 Mobility  ? ?Pt received in BR and agreeable. Had void and BM. No complaints during. Left in chair with call bell in reach.  ? ?Hildred Alamin ?Mobility Specialist  ?M.S. 5N: [phone]  ?

## 2021-12-20 NOTE — Progress Notes (Signed)
?[facility] ?Progress Note  ? ?Subjective: Weight largely unchanged.  Good urine output.  Sodium up to 126.  Patient's appetite has improved.  Denies any significant confusion. ? ?Objective ?Vitals:  ? 12/20/21 0326 12/20/21 0519 12/20/21 0800 12/20/21 0824  ?BP: 109/73  (!) 145/60   ?Pulse: 94  99   ?Resp: 16  17   ?Temp: 98.1 ?F (36.7 ?C)  98.2 ?F (36.8 ?C)   ?TempSrc: Oral  Oral   ?SpO2: 91%  91% 94%  ?Weight:  90.9 kg    ?Height:      ? ?Physical Exam ?General: elderly woman sitting alert in bed ?Heart: Normal rate ?Lungs: Bilateral chest rise with no increased work of breathing ?Abdomen: soft, nontender ?Extremities: trace edema, warm and well-perfused ?Neuro: nonfocal, conversant and oriented ? ?Additional Objective ?Labs: ?Basic Metabolic Panel: ?Recent Labs  ?Lab 12/18/21 ?0119 12/18/21 ?0830 12/19/21 ?0130 12/19/21 ?1903 12/20/21 ?0107  ?NA 122* 123* 121* 123* 126*  ?K 4.2 4.3 4.1  --  4.2  ?CL 83* 81* 84*  --  89*  ?CO2 '28 31 26  '$ --  29  ?GLUCOSE 114* 98 103*  --  117*  ?BUN 24* 22 22  --  18  ?CREATININE 0.94 0.99 0.89  --  1.05*  ?CALCIUM 8.5* 9.0 8.8*  --  8.9  ?PHOS 3.4  --  3.5  --  4.2  ? ?Liver Function Tests: ?Recent Labs  ?Lab 12/13/21 ?1731 12/14/21 ?0240 12/18/21 ?0732 12/19/21 ?0130 12/20/21 ?0107  ?AST 22  --  23  --   --   ?ALT 20  --  20  --   --   ?ALKPHOS 80  --  71  --   --   ?BILITOT 0.6  --  0.6  --   --   ?PROT 6.1*  --  6.0*  --   --   ?ALBUMIN 3.1*   < > 2.9* 2.8* 2.8*  ? < > = values in this interval not displayed.  ? ?No results for input(s): LIPASE, AMYLASE in the last 168 hours. ?CBC: ?Recent Labs  ?Lab 12/19/21 ?0130  ?WBC 11.3*  ?HGB 13.1  ?HCT 38.9  ?MCV 81.2  ?PLT 333  ? ?Blood Culture ?   ?Component Value Date/Time  ? SDES  12/08/2021 1205  ?  BLOOD LEFT ANTECUBITAL ?Performed at [facility], [address] [place], [place] [address] ?  ? SPECREQUEST  12/08/2021 1205  ?  BOTTLES DRAWN AEROBIC AND ANAEROBIC Blood Culture adequate  volume ?Performed at [facility], [address], [place], [place] [address] ?  ? CULT  12/08/2021 1205  ?  NO GROWTH 5 DAYS ?Performed at [facility], [place] [address] [place],  [address] ?  ? REPTSTATUS 12/13/2021 FINAL 12/08/2021 1205  ? ? ?Cardiac Enzymes: ?No results for input(s): CKTOTAL, CKMB, CKMBINDEX, TROPONINI in the last 168 hours. ?CBG: ?No results for input(s): GLUCAP in the last 168 hours. ?Iron Studies: No results for input(s): IRON, TIBC, TRANSFERRIN, FERRITIN in the last 72 hours. ?'@lablastinr3'$ @ ?Studies/Results: ?No results found. ?Medications: ? sodium chloride Stopped (12/16/21 1900)  ? ? apixaban  5 mg Oral BID  ? feeding supplement  237 mL Oral BID BM  ? levothyroxine  200 mcg Oral Q0600  ? mouth rinse  15 mL Mouth Rinse BID  ? metoprolol succinate  50 mg Oral Daily  ? mometasone-formoterol  2 puff Inhalation BID  ? polyethylene glycol  17 g Oral BID  ?  potassium chloride  20 mEq Oral Daily  ? pravastatin  40 mg Oral q1800  ? sacubitril-valsartan  1 tablet Oral BID  ? senna-docusate  2 tablet Oral BID  ? sodium chloride flush  3 mL Intravenous Q12H  ? thiamine  125 mg Oral QPM  ? tolvaptan  15 mg Oral Once  ? ? ?Assessment/Plan: ?**hyponatremia:  hypervolemic.  Initially quite hypervolemic and serum sodium improved with loop diuretics. Low urine sodium and osm > max dilute likely due to CHF.  Good UOP with tolvaptan and Na improved to 126. Will redose tolvaptan '15mg'$  today and likely restart loop diuretics tomorrow. ? ?**HFrEF: EF 45% with mod MR.   On BB and entresto.  Cardiology following.   ? ?**A fib:  s/p DCCV but back in A fib. On eliquis. On BB and amiodarone. Cardiology following.  ? ?**HTN:  normotensive.  ? ?**hypokalemia: on KCL 20 daily, additional supplement PRN.  ? ?PT recommending home with HHPT. ? ?Will follow, call with concerns.   ?Remain inpt for now pending improvement in sodium.  ? ? ? ? ?

## 2021-12-21 DIAGNOSIS — I5041 Acute combined systolic (congestive) and diastolic (congestive) heart failure: Secondary | ICD-10-CM | POA: Diagnosis not present

## 2021-12-21 DIAGNOSIS — I4891 Unspecified atrial fibrillation: Secondary | ICD-10-CM | POA: Diagnosis not present

## 2021-12-21 LAB — RENAL FUNCTION PANEL
Albumin: 2.8 g/dL — ABNORMAL LOW (ref 3.5–5.0)
Anion gap: 9 (ref 5–15)
BUN: 21 mg/dL (ref 8–23)
CO2: 27 mmol/L (ref 22–32)
Calcium: 9 mg/dL (ref 8.9–10.3)
Chloride: 91 mmol/L — ABNORMAL LOW (ref 98–111)
Creatinine, Ser: 0.99 mg/dL (ref 0.44–1.00)
GFR, Estimated: 55 mL/min — ABNORMAL LOW (ref >60)
Glucose, Bld: 116 mg/dL — ABNORMAL HIGH (ref 70–99)
Phosphorus: 3.9 mg/dL (ref 2.5–4.6)
Potassium: 4.3 mmol/L (ref 3.5–5.1)
Sodium: 127 mmol/L — ABNORMAL LOW (ref 135–145)

## 2021-12-21 MED ORDER — AMIODARONE HCL 200 MG PO TABS
400.0000 mg | ORAL_TABLET | Freq: Two times a day (BID) | ORAL | Status: DC
  Administered 2021-12-21 – 2021-12-22 (×3): 400 mg via ORAL
  Filled 2021-12-21 (×3): qty 2

## 2021-12-21 MED ORDER — FUROSEMIDE 40 MG PO TABS
40.0000 mg | ORAL_TABLET | Freq: Two times a day (BID) | ORAL | Status: DC
  Administered 2021-12-21: 40 mg via ORAL
  Filled 2021-12-21: qty 1

## 2021-12-21 MED ORDER — FUROSEMIDE 20 MG PO TABS
20.0000 mg | ORAL_TABLET | Freq: Every day | ORAL | Status: DC
Start: 2021-12-22 — End: 2021-12-22

## 2021-12-21 NOTE — Progress Notes (Signed)
?PROGRESS NOTE ? ? ? ?LYNDEE HERBST  YIR:[id number] DOB: Nov 22, 1930 DOA: 12/08/2021 ?PCP: Jani Gravel, MD  ?Narrative 89/F with history of hypertension dyslipidemia, mild AR, MR, hypothyroidism and GERD presented to the ED 3/5 with acute onset dyspnea.  Patient reported progressive shortness of breath and palpitations X 1 week ?-In the ED she was noted to be in A-fib HR- 120s, and CHF.  Chest x-ray noted bilateral interstitial thickening concerning for edema versus pneumonia in the left lower lobe ?-Started on diuretics, cardiology following, underwent TEE/DC cardioversion ?-Hospital course complicated by worsening hyponatremia, nephrology following as well ? ? ?Subjective: Feels okay, denies any complaints, hoping to go home soon ? ?Assessment and Plan: ? ?Acute systolic CHF ?-echo with EF of 45% with moderate MR ?-Diuresed with IV Lasix earlier this admission, she is 10.6 L negative, transitioned to oral torsemide, now on hold with hyponatremia ?-Clinically appears euvolemic,  ?-Diuresed well with tolvaptan X 2 days, restarting Lasix today  ?-Discharge planning, hopefully home tomorrow ? ?Atrial fibrillation with RVR ([place]) ?- New onset A-fib  ?-CHA2DS2-VASc>3, continue Eliquis ?-Cardiology consulting, underwent TEE/DC cardioversion on 3/8 ?-Back in A-fib, rate controlled, per cards, plan to consider repeat cardioversion as outpatient ?-Continue p.o. metoprolol ? ? 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 ? ?SIRS (systemic inflammatory response syndrome) (HCC) ?-Resolved ?  ?Hypothyroidism ?Home medication regimen includes  levothyroxine 150 to 225 mcg on alternating days, TSH elevated, will increase Synthroid dose to 200 mcg daily ?  ?Normocytic anemia ?-Stable ?  ?  ?DVT prophylaxis: Apixaban ?Code Status: Full code ?Family Communication: Discussed patient in detail,, updated daughter yesterday ?Disposition Plan: Home likely 1 to 2 days ?  ?  ? ?Consultants:  ?Cardiology, nephrology ? ?Procedures: TEE/DC cardioversion 3/8 ? ?Antimicrobials:  ? ? ?Objective: ?Vitals:  ? 12/21/21 5009 12/21/21 0440 12/21/21 0813 12/21/21 0818  ?BP: 121/61   (!) 97/59  ?Pulse: 95 (!) 101  92  ?Resp: 17 (!) 21  19  ?Temp: 98.6 ?F (37 ?C)   98.1 ?F (36.7 ?C)  ?TempSrc: Oral   Axillary  ?SpO2: 96% 97% 94% 95%  ?Weight:  93.3 kg    ?Height:      ? ? ?Intake/Output Summary (Last 24 hours) at 12/21/2021 1111 ?Last data filed at 12/21/2021 0800 ?Gross per 24 hour  ?Intake 1140 ml  ?Output 2000 ml  ?Net -860 ml  ? ?Filed Weights  ? 12/19/21 0401 12/20/21 0519 12/21/21 0440  ?Weight: 90.6 kg 90.9 kg 93.3 kg  ? ? ?Examination: ? ?General exam: Pleasant elderly chronically ill female sitting up in bed, AAOx3, no distress ?HEENT: No JVD ?CVS: S1-S2, irregularly irregular rhythm ?Lungs: Decreased breath sounds bases ?Abdomen: Soft, nontender, bowel sounds present ?Extremities: No edema  ?Skin: No rashes on exposed skin ?Psychiatry: Judgement and insight appear normal. Mood & affect appropriate.  ? ?Data Reviewed:  ? ?CBC: ?Recent Labs  ?Lab 12/19/21 ?0130  ?WBC 11.3*  ?HGB 13.1  ?HCT 38.9  ?MCV 81.2  ?PLT 333  ? ?Basic Metabolic Panel: ?Recent Labs  ?Lab 12/17/21 ?0037 12/18/21 ?0119 12/18/21 ?0830 12/19/21 ?0130 12/19/21 ?1903 12/20/21 ?0107 12/20/21 ?1839 12/21/21 ?0203  ?NA 124* 122* 123* 121* 123* 126* 128* 127*  ?  K 3.2* 4.2 4.3 4.1  --  4.2  --  4.3  ?CL 81* 83* 81* 84*  --  89*  --  91*  ?CO2 33* '28 31 26  '$ --  29  --  27  ?GLUCOSE 108* 114* 98 103*  --  117*  --  116*  ?BUN 18 24* 22 22  --  18  --  21  ?CREATININE 0.88 0.94 0.99 0.89  --  1.05*  --  0.99   ?CALCIUM 8.6* 8.5* 9.0 8.8*  --  8.9  --  9.0  ?MG 1.5*  --   --   --   --   --   --   --   ?PHOS 3.4 3.4  --  3.5  --  4.2  --  3.9  ? ?GFR: ?Estimated Creatinine Clearance: 45.6 mL/min (by C-G formula based on SCr of 0.99 mg/dL). ?Liver Function Tests: ?Recent Labs  ?Lab 12/18/21 ?0119 12/18/21 ?0732 12/19/21 ?0130 12/20/21 ?0107 12/21/21 ?7564  ?AST  --  23  --   --   --   ?ALT  --  20  --   --   --   ?ALKPHOS  --  1  --   --   --   ?BILITOT  --  0.6  --   --   --   ?PROT  --  6.0*  --   --   --   ?ALBUMIN 2.7* 2.9* 2.8* 2.8* 2.8*  ? ?No results for input(s): LIPASE, AMYLASE in the last 168 hours. ?No results for input(s): AMMONIA in the last 168 hours. ?Coagulation Profile: ?No results for input(s): INR, PROTIME in the last 168 hours. ?Cardiac Enzymes: ?No results for input(s): CKTOTAL, CKMB, CKMBINDEX, TROPONINI in the last 168 hours. ?BNP (last 3 results) ?No results for input(s): PROBNP in the last 8760 hours. ?HbA1C: ?No results for input(s): HGBA1C in the last 72 hours. ?CBG: ?No results for input(s): GLUCAP in the last 168 hours. ?Lipid Profile: ?No results for input(s): CHOL, HDL, LDLCALC, TRIG, CHOLHDL, LDLDIRECT in the last 72 hours. ?Thyroid Function Tests: ?No results for input(s): TSH, T4TOTAL, FREET4, T3FREE, THYROIDAB in the last 72 hours. ?Anemia Panel: ?No results for input(s): VITAMINB12, FOLATE, FERRITIN, TIBC, IRON, RETICCTPCT in the last 72 hours. ?Urine analysis: ?   ?Component Value Date/Time  ? COLORURINE YELLOW 12/12/2021 1311  ? APPEARANCEUR CLEAR 12/12/2021 1311  ? LABSPEC 1.008 12/12/2021 1311  ? PHURINE 6.0 12/12/2021 1311  ? GLUCOSEU NEGATIVE 12/12/2021 1311  ? HGBUR NEGATIVE 12/12/2021 1311  ? BILIRUBINUR NEGATIVE 12/12/2021 1311  ? KETONESUR NEGATIVE 12/12/2021 1311  ? PROTEINUR NEGATIVE 12/12/2021 1311  ? NITRITE NEGATIVE 12/12/2021 1311  ? LEUKOCYTESUR NEGATIVE 12/12/2021 1311  ? ?Sepsis Labs: ?'@LABRCNTIP'$ (procalcitonin:4,lacticidven:4) ? ?) ?No results found for this or any  previous visit (from the past 240 hour(s)). ?  ? ?Radiology Studies: ?No results found. ? ? ?Scheduled Meds: ? amiodarone  400 mg Oral BID  ? apixaban  5 mg Oral BID  ? feeding supplement  237 mL Oral BID BM  ? [START ON 12/22/2021] furosemide  20 mg Oral Daily  ? levothyroxine  200 mcg Oral Q0600  ? mouth rinse  15 mL Mouth Rinse BID  ? metoprolol succinate  50 mg Oral Daily  ? mometasone-formoterol  2 puff Inhalation BID  ? polyethylene glycol  17 g Oral BID  ? potassium chloride  20 mEq Oral Daily  ? pravastatin  40 mg Oral q1800  ? sacubitril-valsartan  1 tablet Oral BID  ? senna-docusate  2 tablet Oral BID  ? sodium chloride flush  3 mL Intravenous Q12H  ? thiamine  125 mg Oral QPM  ? ?Continuous Infusions: ? sodium chloride Stopped (12/16/21 1900)  ? ? ? LOS: 13 days  ? ? ?Time spent: 48mn ? ?PDomenic Polite MD ?[facility] ? ? ?12/21/2021, 11:11 AM  ?  ?

## 2021-12-21 NOTE — Progress Notes (Signed)
Mobility Specialist Progress Note ? ? 12/21/21 1215  ?Orthostatic Lying   ?BP- Lying 97/66  ?Orthostatic Sitting  ?BP- Sitting 106/50  ?Orthostatic Standing at 0 minutes  ?BP- Standing at 0 minutes 92/60  ?Orthostatic Standing at 3 minutes  ?BP- Standing at 3 minutes 109/72  ?Mobility  ?Activity Stood at bedside;Transferred to/from [facility] ?(x5 STS)  ?Level of Assistance Contact guard assist, steadying assist  ?Assistive Device Front wheel walker  ?Activity Response Tolerated well  ?$Mobility charge 1 Mobility  ? ?Pt received in bed having no complaints but requiring min encourage for mobility. Min A to for trunk support to EOB. x5 STS w/ CG for safety other wise no complaints during session. Tx'd to [facility] and left there w/ call bell in reach and NT notified.   ? ?Pre Mobility: 94 HR ?During Mobility: 117 HR ?Post Mobility: 86 HR ? ?Holland Falling ?Mobility Specialist ?Phone Number [phone] ? ?

## 2021-12-21 NOTE — Plan of Care (Signed)

## 2021-12-21 NOTE — Progress Notes (Signed)
? [facility] ?Progress Note  ? ?Subjective: Patient feels well wanting to go home. No complaints ? ?Objective ?Vitals:  ? 12/21/21 0932 12/21/21 0440 12/21/21 0813 12/21/21 0818  ?BP: 121/61   (!) 97/59  ?Pulse: 95 (!) 101  92  ?Resp: 17 (!) 21  19  ?Temp: 98.6 ?F (37 ?C)   98.1 ?F (36.7 ?C)  ?TempSrc: Oral   Axillary  ?SpO2: 96% 97% 94% 95%  ?Weight:  93.3 kg    ?Height:      ? ?Physical Exam ?General: elderly woman sitting alert in bed ?Heart: Normal rate ?Lungs: Bilateral chest rise with no increased work of breathing ?Abdomen: soft, nontender ?Extremities: trace edema, warm and well-perfused ?Neuro: nonfocal, conversant and oriented ? ?Additional Objective ?Labs: ?Basic Metabolic Panel: ?Recent Labs  ?Lab 12/19/21 ?0130 12/19/21 ?1903 12/20/21 ?0107 12/20/21 ?1839 12/21/21 ?0203  ?NA 121*   < > 126* 128* 127*  ?K 4.1  --  4.2  --  4.3  ?CL 84*  --  89*  --  91*  ?CO2 26  --  29  --  27  ?GLUCOSE 103*  --  117*  --  116*  ?BUN 22  --  18  --  21  ?CREATININE 0.89  --  1.05*  --  0.99  ?CALCIUM 8.8*  --  8.9  --  9.0  ?PHOS 3.5  --  4.2  --  3.9  ? < > = values in this interval not displayed.  ? ?Liver Function Tests: ?Recent Labs  ?Lab 12/18/21 ?0732 12/19/21 ?0130 12/20/21 ?0107 12/21/21 ?0203  ?AST 23  --   --   --   ?ALT 20  --   --   --   ?ALKPHOS 71  --   --   --   ?BILITOT 0.6  --   --   --   ?PROT 6.0*  --   --   --   ?ALBUMIN 2.9* 2.8* 2.8* 2.8*  ? ?No results for input(s): LIPASE, AMYLASE in the last 168 hours. ?CBC: ?Recent Labs  ?Lab 12/19/21 ?0130  ?WBC 11.3*  ?HGB 13.1  ?HCT 38.9  ?MCV 81.2  ?PLT 333  ? ?Blood Culture ?   ?Component Value Date/Time  ? SDES  12/08/2021 1205  ?  BLOOD LEFT ANTECUBITAL ?Performed at [facility], [address], [place], [place] [address] ?  ? SPECREQUEST  12/08/2021 1205  ?  BOTTLES DRAWN AEROBIC AND ANAEROBIC Blood Culture adequate volume ?Performed at [facility], [address], [place], [place] [address] ?  ?  CULT  12/08/2021 1205  ?  NO GROWTH 5 DAYS ?Performed at [facility], [place] [address], [place], [place] [address] ?  ? REPTSTATUS 12/13/2021 FINAL 12/08/2021 1205  ? ? ?Cardiac Enzymes: ?No results for input(s): CKTOTAL, CKMB, CKMBINDEX, TROPONINI in the last 168 hours. ?CBG: ?No results for input(s): GLUCAP in the last 168 hours. ?Iron Studies: No results for input(s): IRON, TIBC, TRANSFERRIN, FERRITIN in the last 72 hours. ?'@lablastinr3'$ @ ?Studies/Results: ?No results found. ?Medications: ? sodium chloride Stopped (12/16/21 1900)  ? ? amiodarone  400 mg Oral BID  ? apixaban  5 mg Oral BID  ? feeding supplement  237 mL Oral BID BM  ? [START ON 12/22/2021] furosemide  20 mg Oral Daily  ? levothyroxine  200 mcg Oral Q0600  ? mouth rinse  15 mL Mouth Rinse BID  ? metoprolol succinate  50 mg Oral Daily  ? mometasone-formoterol  2 puff Inhalation BID  ? polyethylene glycol  17 g Oral BID  ? potassium chloride  20 mEq Oral Daily  ? pravastatin  40 mg Oral q1800  ? sacubitril-valsartan  1 tablet Oral BID  ? senna-docusate  2 tablet Oral BID  ? sodium chloride flush  3 mL Intravenous Q12H  ? thiamine  125 mg Oral QPM  ? ? ?Assessment/Plan: ?**hyponatremia:  hypervolemic.  Initially quite hypervolemic and serum sodium improved with loop diuretics. Low urine sodium and osm > max dilute likely due to CHF.  Good UOP with tolvaptan and Na improved to 126 initially and only small rise in Na to 127 today. Will start lasix '40mg'$  BID to help maintain Na and hopefully some further slow improvement. If Na near stable tomorrow she can DC at that time.  ? ?**HFrEF: EF 45% with mod MR.   On BB and entresto.  Cardiology following.   ? ?**A fib:  s/p DCCV but back in A fib. On eliquis. On BB and amiodarone. Cardiology following.  ? ?**HTN:  normotensive.  ? ?**hypokalemia: on KCL 20 daily, additional supplement PRN.  ? ?PT recommending home with HHPT. ? ?Will follow, call with concerns.   ?Remain inpt for now pending improvement in  sodium.  ? ? ? ? ?

## 2021-12-21 NOTE — Plan of Care (Signed)
?  Problem: Education: ?Goal: Knowledge of General Education information will improve ?Description: Including pain rating scale, medication(s)/side effects and non-pharmacologic comfort measures ?Outcome: Progressing ?  ?Problem: Health Behavior/Discharge Planning: ?Goal: Ability to manage health-related needs will improve ?Outcome: Progressing ?  ?Problem: Activity: ?Goal: Risk for activity intolerance will decrease ?Outcome: Progressing ?  ?Problem: Coping: ?Goal: Level of anxiety will decrease ?Outcome: Progressing ?  ?Problem: Elimination: ?Goal: Will not experience complications related to bowel motility ?Outcome: Progressing ?  ?Problem: Skin Integrity: ?Goal: Risk for impaired skin integrity will decrease ?Outcome: Progressing ?  ?

## 2021-12-21 NOTE — Progress Notes (Signed)
Patient in no respiratory distress at this time. Sp02=96% on room air with B/S decreased in lower lobes. Bipap not needed at this time.  ? ?

## 2021-12-21 NOTE — Progress Notes (Signed)
? ?Progress Note ? ?Patient Name: [name] ?Date of Encounter: 12/21/2021 ? ?[facility] Cardiologist: Dr Oval Linsey ? ?Subjective  ? ?No CP or dyspnea ? ?Inpatient Medications  ?  ?Scheduled Meds: ? apixaban  5 mg Oral BID  ? feeding supplement  237 mL Oral BID BM  ? furosemide  40 mg Oral BID  ? levothyroxine  200 mcg Oral Q0600  ? mouth rinse  15 mL Mouth Rinse BID  ? metoprolol succinate  50 mg Oral Daily  ? mometasone-formoterol  2 puff Inhalation BID  ? polyethylene glycol  17 g Oral BID  ? potassium chloride  20 mEq Oral Daily  ? pravastatin  40 mg Oral q1800  ? sacubitril-valsartan  1 tablet Oral BID  ? senna-docusate  2 tablet Oral BID  ? sodium chloride flush  3 mL Intravenous Q12H  ? thiamine  125 mg Oral QPM  ? ?Continuous Infusions: ? sodium chloride Stopped (12/16/21 1900)  ? ?PRN Meds: ?sodium chloride, acetaminophen, albuterol, camphor-menthol, fluticasone, LORazepam, ondansetron (ZOFRAN) IV, sodium chloride flush  ? ?Vital Signs  ?  ?Vitals:  ? 12/20/21 2026 12/20/21 2336 12/21/21 0312 12/21/21 0440  ?BP:  (!) 105/58 121/61   ?Pulse:  97 95 (!) 101  ?Resp:  18 17 (!) 21  ?Temp:  98.2 ?F (36.8 ?C) 98.6 ?F (37 ?C)   ?TempSrc:  Oral Oral   ?SpO2: 97% 95% 96% 97%  ?Weight:    93.3 kg  ?Height:      ? ? ?Intake/Output Summary (Last 24 hours) at 12/21/2021 7035 ?Last data filed at 12/21/2021 [phone] ?Gross per 24 hour  ?Intake 840 ml  ?Output 2000 ml  ?Net -1160 ml  ? ?Last 3 Weights 12/21/2021 12/20/2021 12/19/2021  ?Weight (lbs) 205 lb 11 oz 200 lb 6.4 oz 199 lb 11.8 oz  ?Weight (kg) 93.3 kg 90.9 kg 90.6 kg  ?   ? ?Telemetry  ?  ?Atrial fibrillation rate controlled - Personally Reviewed ? ?Physical Exam  ? ?GEN: No acute distress.   ?Neck: No JVD ?Cardiac: irregular ?Respiratory: Clear to auscultation bilaterally. ?GI: Soft, nontender, non-distended  ?MS: No edema. ?Neuro:  Nonfocal  ?Psych: Normal affect  ? ?Labs  ?  ?High Sensitivity Troponin:   ?Recent Labs  ?Lab 12/08/21 ?1158 12/12/21 ?0518   ?TROPONINIHS 6 10  ?   ?Chemistry ?Recent Labs  ?Lab 12/17/21 ?0037 12/18/21 ?0119 12/18/21 ?0732 12/18/21 ?0830 12/19/21 ?0130 12/19/21 ?1903 12/20/21 ?0107 12/20/21 ?1839 12/21/21 ?0203  ?NA 124*   < >  --    < > 121*   < > 126* 128* 127*  ?K 3.2*   < >  --    < > 4.1  --  4.2  --  4.3  ?CL 81*   < >  --    < > 84*  --  89*  --  91*  ?CO2 33*   < >  --    < > 26  --  29  --  27  ?GLUCOSE 108*   < >  --    < > 103*  --  117*  --  116*  ?BUN 18   < >  --    < > 22  --  18  --  21  ?CREATININE 0.88   < >  --    < > 0.89  --  1.05*  --  0.99  ?CALCIUM 8.6*   < >  --    < >  8.8*  --  8.9  --  9.0  ?MG 1.5*  --   --   --   --   --   --   --   --   ?PROT  --   --  6.0*  --   --   --   --   --   --   ?ALBUMIN 2.9*   < > 2.9*  --  2.8*  --  2.8*  --  2.8*  ?AST  --   --  23  --   --   --   --   --   --   ?ALT  --   --  20  --   --   --   --   --   --   ?ALKPHOS  --   --  61  --   --   --   --   --   --   ?BILITOT  --   --  0.6  --   --   --   --   --   --   ?GFRNONAA >60   < >  --    < > >60  --  51*  --  55*  ?ANIONGAP 10   < >  --    < > 11  --  8  --  9  ? < > = values in this interval not displayed.  ?  ? ?Hematology ?Recent Labs  ?Lab 12/19/21 ?0130  ?WBC 11.3*  ?RBC 4.79  ?HGB 13.1  ?HCT 38.9  ?MCV 81.2  ?MCH 27.3  ?MCHC 33.7  ?RDW 13.3  ?PLT 333  ? ? ?Patient Profile  ?   ?88 y.o. female with past medical history of hypertension, hyperlipidemia, newly diagnosed atrial fibrillation admitted with congestive heart failure and atrial fibrillation.  Underwent TEE guided cardioversion March 8 but reverted to atrial fibrillation March 14.  TEE this admission showed ejection fraction 45 to 50%, mild left atrial enlargement, left pleural effusion, moderate mitral regurgitation, mild to moderate tricuspid regurgitation, mild to moderate aortic insufficiency. ? ?Assessment & Plan  ?  ?1 persistent atrial fibrillation-patient remains in atrial fibrillation today.  We will continue Toprol for rate control.  Continue apixaban.   Patient is not on amiodarone and will begin 400 mg twice daily for 1 week then 200 mg daily thereafter.  Would plan to proceed with repeat cardioversion in 4 to 6 weeks to see if she will maintain sinus rhythm on amiodarone. ? ?2 acute combined systolic/diastolic congestive heart failure-patient does not appear to be significantly volume overloaded on examination today.  Her sodium has been low.  She did receive tolvaptan.  We will resume Lasix 20 mg daily.  She will continue on Entresto and Toprol. ? ?3 cardiomyopathy-ejection fraction mildly reduced.  May be related to atrial fibrillation.  Can reassess once sinus is reestablished. ? ?4 hyponatremia-some improvement today.  Follow closely. ? ?5 hypothyroidism-levothyroxine dosing per primary service. ? ?Patient can be discharged once sodium improves on present medications.  We will arrange follow-up in our office 2 weeks following discharge.  Cardiology will sign off.  Please call with questions. ? ?For questions or updates, please contact [facility] ?Please consult [email or web address] for contact info under  ? ?  ?   ?Signed, ?Kirk Ruths, MD  ?12/21/2021, 8:12 AM   ? ?

## 2021-12-22 DIAGNOSIS — I5031 Acute diastolic (congestive) heart failure: Secondary | ICD-10-CM | POA: Diagnosis not present

## 2021-12-22 DIAGNOSIS — I5041 Acute combined systolic (congestive) and diastolic (congestive) heart failure: Secondary | ICD-10-CM | POA: Diagnosis not present

## 2021-12-22 DIAGNOSIS — I4891 Unspecified atrial fibrillation: Secondary | ICD-10-CM | POA: Diagnosis not present

## 2021-12-22 LAB — RENAL FUNCTION PANEL
Albumin: 2.7 g/dL — ABNORMAL LOW (ref 3.5–5.0)
Anion gap: 8 (ref 5–15)
BUN: 22 mg/dL (ref 8–23)
CO2: 27 mmol/L (ref 22–32)
Calcium: 8.7 mg/dL — ABNORMAL LOW (ref 8.9–10.3)
Chloride: 93 mmol/L — ABNORMAL LOW (ref 98–111)
Creatinine, Ser: 1.17 mg/dL — ABNORMAL HIGH (ref 0.44–1.00)
GFR, Estimated: 45 mL/min — ABNORMAL LOW (ref >60)
Glucose, Bld: 117 mg/dL — ABNORMAL HIGH (ref 70–99)
Phosphorus: 4.3 mg/dL (ref 2.5–4.6)
Potassium: 4.3 mmol/L (ref 3.5–5.1)
Sodium: 128 mmol/L — ABNORMAL LOW (ref 135–145)

## 2021-12-22 MED ORDER — FUROSEMIDE 40 MG PO TABS: 40.0000 mg | ORAL_TABLET | Freq: Every day | ORAL | 0 refills | Status: DC

## 2021-12-22 MED ORDER — LEVOTHYROXINE SODIUM 200 MCG PO TABS: 200.0000 ug | ORAL_TABLET | Freq: Every day | ORAL | 0 refills | Status: DC

## 2021-12-22 MED ORDER — SENNOSIDES-DOCUSATE SODIUM 8.6-50 MG PO TABS: 1.0000 | ORAL_TABLET | Freq: Every evening | ORAL | 0 refills | Status: DC | PRN

## 2021-12-22 MED ORDER — METOPROLOL SUCCINATE ER 50 MG PO TB24: 50.0000 mg | ORAL_TABLET | Freq: Every day | ORAL | 0 refills | Status: DC

## 2021-12-22 MED ORDER — POTASSIUM CHLORIDE CRYS ER 20 MEQ PO TBCR: 20.0000 meq | EXTENDED_RELEASE_TABLET | Freq: Every day | ORAL | 0 refills | Status: DC

## 2021-12-22 MED ORDER — AMIODARONE HCL 200 MG PO TABS: 400.0000 mg | ORAL_TABLET | Freq: Two times a day (BID) | ORAL | 1 refills | Status: DC

## 2021-12-22 MED ORDER — APIXABAN 5 MG PO TABS: 5.0000 mg | ORAL_TABLET | Freq: Two times a day (BID) | ORAL | 0 refills | Status: DC

## 2021-12-22 MED ORDER — SACUBITRIL-VALSARTAN 24-26 MG PO TABS: 1.0000 | ORAL_TABLET | Freq: Two times a day (BID) | ORAL | 0 refills | Status: DC

## 2021-12-22 MED ORDER — FUROSEMIDE 40 MG PO TABS
40.0000 mg | ORAL_TABLET | Freq: Every day | ORAL | Status: DC
  Administered 2021-12-22: 40 mg via ORAL
  Filled 2021-12-22: qty 1

## 2021-12-22 NOTE — Progress Notes (Signed)
RN updated daughter [name] on discharge information and answered questions. ?

## 2021-12-22 NOTE — Discharge Summary (Signed)
Physician Discharge Summary  ?[name] AJO:[id number] DOB: 1931/05/22 DOA: 12/08/2021 ? ?PCP: Jani Gravel, MD ? ?Admit date: 12/08/2021 ?Discharge date: 12/22/2021 ? ?Time spent: 35 minutes ? ?Recommendations for Outpatient Follow-up:  ?Cardiology follow-up in 1 to 2 weeks with Dr. Oval Linsey or APP ?Please check BMP at follow-up ?May need repeat cardioversion ? ? ?Discharge Diagnoses:  ?Active Problems: ?  Acute systolic CHF ?Persistent atrial fibrillation with RVR ([facility]) ?  Hyponatremia ?  Essential hypertension ?  Normocytic anemia ?  Hypothyroidism ?  Class 1 obesity ? ? ?Discharge Condition: Stable ? ?Diet recommendation: Low-sodium, heart healthy ? ?Filed Weights  ? 12/20/21 0519 12/21/21 0440 12/22/21 0534  ?Weight: 90.9 kg 93.3 kg 90.3 kg  ? ? ?History of present illness:  ? 89/F with history of hypertension dyslipidemia, mild AR, MR, hypothyroidism and GERD presented to the ED 3/5 with acute onset dyspnea.  Patient reported progressive shortness of breath and palpitations X 1 week ?-In the ED she was noted to be in A-fib HR- 120s, and CHF.  Chest x-ray noted bilateral interstitial thickening concerning for edema versus pneumonia in the left lower lobe ? ?Hospital Course:  ? ?Acute systolic CHF ?Moderate mitral regurgitation ?-echo with EF of 45% with moderate MR ?-Diuresed with IV Lasix earlier this admission, she is 12 L negative, ?-Clinically appears euvolemic now, transition to Lasix 40 Mg daily at discharge ?-Also given tolvaptan x2 days for hyponatremia ?-Discharge home with home health services, close follow-up with Dr. Oval Linsey or APP in 2 weeks ?  ?Atrial fibrillation with RVR ([facility]) ?- New onset A-fib  ?-CHA2DS2-VASc>3 ?-Cardiology consulting, underwent TEE/DC cardioversion on 3/8 ?-Back in A-fib few days later, rate controlled, per cards, plan to consider repeat cardioversion as outpatient ?-Continue p.o. metoprolol, Eliquis ?  ? Hyponatremia ?-Likely multifactorial, secondary to volume overload,  diuretics and likely component of SIADH too ?-Initially hypervolemic, then euvolemic ?-Seen by nephrology in consultation, torsemide held, Started tolvaptan 3/16-3/17 ?-Sodium improving, 128 at discharge, Lasix 40 Mg daily to be continued ?- fluoxetine discontinued last week, Synthroid dose increased last week based on elevated TSH as well ?-Please repeat BMP in few weeks ?  ?SIRS (systemic inflammatory response syndrome) (HCC) ?-Resolved ?  ?Hypothyroidism ?Home medication regimen includes levothyroxine 150 to 225 mcg on alternating days, TSH elevated, increased Synthroid dose to 200 mcg daily ?  ?Normocytic anemia ?-Stable ?   ?Consultants:  ?Cardiology, nephrology ?  ?Procedures: TEE/DC cardioversion 3/8 ? ?Discharge Exam: ?Vitals:  ? 12/22/21 0908 12/22/21 1130  ?BP:  105/71  ?Pulse: 91 95  ?Resp: 18 20  ?Temp:  97.6 ?F (36.4 ?C)  ?SpO2: 96% 95%  ?General exam: Pleasant elderly chronically ill female sitting up in bed, AAOx3, no distress ?HEENT: No JVD ?CVS: S1-S2, irregularly irregular rhythm ?Lungs: Decreased breath sounds bases ?Abdomen: Soft, nontender, bowel sounds present ?Extremities: No edema  ?Skin: No rashes on exposed skin ?Psychiatry: Judgement and insight appear normal. Mood & affect appropriate.  ? ? ?Discharge Instructions ? ? ?Discharge Instructions   ? ? Diet - low sodium heart healthy   Complete by: As directed ?  ? Discharge wound care:   Complete by: As directed ?  ? routine  ? Increase activity slowly   Complete by: As directed ?  ? ?  ? ?Allergies as of 12/22/2021   ?No Known Allergies ?  ? ?  ?Medication List  ?  ? ?STOP taking these medications   ? ?amLODipine 2.5 MG tablet ?Commonly known as: NORVASC ?  ?  aspirin 81 MG tablet ?  ?FLUoxetine 40 MG capsule ?Commonly known as: PROZAC ?  ?naproxen 500 MG tablet ?Commonly known as: NAPROSYN ?  ? ?  ? ?TAKE these medications   ? ?amiodarone 200 MG tablet ?Commonly known as: PACERONE ?Take 2 tablets (400 mg total) by mouth 2 (two) times daily.  Take 2tabs twice a day for 6days then 1 tab daily ?  ?apixaban 5 MG Tabs tablet ?Commonly known as: ELIQUIS ?Take 1 tablet (5 mg total) by mouth 2 (two) times daily. ?  ?Calcium Carbonate-Vitamin D 500-125 MG-UNIT Tabs ?Take 1 tablet by mouth every evening. ?  ?Fish Oil 1200 MG Caps ?Take by mouth. ?  ?fluticasone 50 MCG/ACT nasal spray ?Commonly known as: FLONASE ?Place 1 spray into both nostrils daily as needed for allergies or rhinitis. ?  ?furosemide 40 MG tablet ?Commonly known as: LASIX ?Take 1 tablet (40 mg total) by mouth daily. ?  ?levothyroxine 200 MCG tablet ?Commonly known as: SYNTHROID ?Take 1 tablet (200 mcg total) by mouth daily before breakfast. ?What changed:  ?medication strength ?how much to take ?when to take this ?additional instructions ?  ?LORazepam 0.5 MG tablet ?Commonly known as: ATIVAN ?Take 0.5 mg by mouth daily as needed for anxiety. ?  ?lovastatin 40 MG tablet ?Commonly known as: MEVACOR ?Take 60 mg by mouth at bedtime. ?  ?metoprolol succinate 50 MG 24 hr tablet ?Commonly known as: TOPROL-XL ?Take 1 tablet (50 mg total) by mouth daily. Take with or immediately following a meal. ?  ?multivitamin with minerals Tabs tablet ?Take 1 tablet by mouth every evening. ?  ?Oscoda ?Take 1 capsule by mouth every evening. ?  ?omeprazole 10 MG capsule ?Commonly known as: PRILOSEC ?Take 10 mg by mouth every evening. ?  ?potassium chloride SA 20 MEQ tablet ?Commonly known as: KLOR-CON M ?Take 1 tablet (20 mEq total) by mouth daily. ?  ?sacubitril-valsartan 24-26 MG ?Commonly known as: ENTRESTO ?Take 1 tablet by mouth 2 (two) times daily. ?  ?senna-docusate 8.6-50 MG tablet ?Commonly known as: Senokot-S ?Take 1 tablet by mouth at bedtime as needed for mild constipation. ?  ?vitamin B-1 250 MG tablet ?Take 125 mg by mouth every evening. ?  ?vitamin B-12 1000 MCG tablet ?Commonly known as: CYANOCOBALAMIN ?Take 1,000 mcg by mouth daily. ?  ?Vitamin D 50 MCG (2000 UT) Caps ?Take  2,000 Units by mouth daily. ?  ? ?  ? ?  ?  ? ? ?  ?Discharge Care Instructions  ?(From admission, onward)  ?  ? ? ?  ? ?  Start     Ordered  ? 12/22/21 0000  Discharge wound care:       ?Comments: routine  ? 12/22/21 0801  ? ?  ?  ? ?  ? ?No Known Allergies ? Follow-up Information   ? ? [facility] Follow [facility].   ?Specialty: Home Health Services ?Why: Your home health has been set up with [facility]. The office will call you with start of care information. If you have any questions or concerns please call the number listed above. ?Contact information: ?[address] ?[address] ?[place] [address] ?[phone] ? ? ?  ?  ? ? [facility] Follow up on 12/30/2021.   ?Specialty: Cardiology ?Why: at 10:00AM  this is at [facility] - call for parking instructions and [facility]. ?Contact information: ?[address] ?[id number] mc ?[place] ?[phone] ? ?  ?  ? ?  ?  ? ?  ? ? ? ?  The results of significant diagnostics from this hospitalization (including imaging, microbiology, ancillary and laboratory) are listed below for reference.   ? ?Significant Diagnostic Studies: ?DG Chest 1 View ? ?Result Date: 12/12/2021 ?CLINICAL DATA:  Shortness of breath EXAM: CHEST  1 VIEW COMPARISON:  Yesterday FINDINGS: Cardiomegaly and vascular pedicle widening with left pleural effusion and diffuse interstitial prominence. No pneumothorax. Much of the left lower lung is obscured. Remote left rib fractures. IMPRESSION: CHF with asymmetric left pleural effusion. No convincing change from yesterday. Electronically Signed   By: Jorje Guild M.D.   On: 12/12/2021 04:28  ? ?DG CHEST PORT 1 VIEW ? ?Result Date: 12/11/2021 ?CLINICAL DATA:  Shortness of breath EXAM: PORTABLE CHEST 1 VIEW COMPARISON:  12/08/2021 FINDINGS: Remote left rib fractures. Midline trachea. Moderate cardiomegaly. Layering left pleural effusion is small and similar. No pneumothorax. Interstitial prominence  persists. Left greater than right base airspace disease is not significantly changed. IMPRESSION: Given differences in technique, no significant change since 12/08/2021. Congestive heart failure with layerin

## 2021-12-22 NOTE — TOC Transition Note (Signed)
Transition of Care (TOC) - CM/SW Discharge Note ? ? ?Patient Details  ?Name: [name] ?MRN: [id number] ?Date of Birth: January 23, 1931 ? ?Transition of Care (TOC) CM/SW Contact:  ?Pollie Friar, RN ?Phone Number: ?12/22/2021, 8:58 AM ? ? ?Clinical Narrative:    ?Patient is discharging home today with home health services through [facility]. [name] with [facility] updated on the d/c home.  ?Pt has needed DME at home.  ?CM provided her with 30 day free coupons for Eliquis and Entresto.  ?Pt states her spouse and daughter will be providing supervision at home.  ?Daughter to provide transport home per pt.  ? ? ?Final next level of care: [facility] ?Barriers to Discharge: No Barriers Identified ? ? ?Patient Goals and CMS Choice ?  ?CMS Medicare.gov Compare Post Acute Care list provided to:: Patient ?Choice offered to / list presented to : Patient ? ?Discharge Placement ?  ?           ?  ?  ?  ?  ? ?Discharge Plan and Services ?  ?  ?           ?  ?  ?  ?  ?  ?HH Arranged: PT ?[facility] Agency: [facility] ?Date HH Agency Contacted: 12/22/21 ?  ?Representative spoke with at [facility]: Drema Balzarine ? ?Social Determinants of Health (SDOH) Interventions ?Food Insecurity Interventions: Intervention Not Indicated ?Financial Strain Interventions: Intervention Not Indicated ?Housing Interventions: Intervention Not Indicated ?Transportation Interventions: Intervention Not Indicated ? ? ?Readmission Risk Interventions ?No flowsheet data found. ? ? ? ? ?

## 2021-12-22 NOTE — Progress Notes (Signed)
?[facility] ?Progress Note  ? ?Subjective: Excited to potentially going home.  Some shortness of breath but thinks it might be related to anxiety.  No chest pain ? ?Objective ?Vitals:  ? 12/22/21 0015 12/22/21 0400 12/22/21 0534 12/22/21 0800  ?BP: 116/72 128/67  125/84  ?Pulse: (!) 105 93 (!) 112 99  ?Resp: 19 (!) '22 16 19  '$ ?Temp: 97.7 ?F (36.5 ?C) 97.6 ?F (36.4 ?C)  97.6 ?F (36.4 ?C)  ?TempSrc: Oral Oral  Oral  ?SpO2: 91% 95% 94% 95%  ?Weight:   90.3 kg   ?Height:      ? ?Physical Exam ?General: elderly woman sitting alert in bed ?Heart: Normal rate ?Lungs: Bilateral chest rise with no increased work of breathing ?Abdomen: soft, nontender ?Extremities: trace edema, warm and well-perfused ?Neuro: nonfocal, conversant and oriented ? ?Additional Objective ?Labs: ?Basic Metabolic Panel: ?Recent Labs  ?Lab 12/20/21 ?0107 12/20/21 ?1839 12/21/21 ?0203 12/22/21 ?3382  ?NA 126* 128* 127* 128*  ?K 4.2  --  4.3 4.3  ?CL 89*  --  91* 93*  ?CO2 29  --  27 27  ?GLUCOSE 117*  --  116* 117*  ?BUN 18  --  21 22  ?CREATININE 1.05*  --  0.99 1.17*  ?CALCIUM 8.9  --  9.0 8.7*  ?PHOS 4.2  --  3.9 4.3  ? ?Liver Function Tests: ?Recent Labs  ?Lab 12/18/21 ?0732 12/19/21 ?0130 12/20/21 ?0107 12/21/21 ?0203 12/22/21 ?5053  ?AST 23  --   --   --   --   ?ALT 20  --   --   --   --   ?ALKPHOS 71  --   --   --   --   ?BILITOT 0.6  --   --   --   --   ?PROT 6.0*  --   --   --   --   ?ALBUMIN 2.9*   < > 2.8* 2.8* 2.7*  ? < > = values in this interval not displayed.  ? ?No results for input(s): LIPASE, AMYLASE in the last 168 hours. ?CBC: ?Recent Labs  ?Lab 12/19/21 ?0130  ?WBC 11.3*  ?HGB 13.1  ?HCT 38.9  ?MCV 81.2  ?PLT 333  ? ?Blood Culture ?   ?Component Value Date/Time  ? SDES  12/08/2021 1205  ?  BLOOD LEFT ANTECUBITAL ?Performed at [facility], [address], [place], [place] [address] ?  ? SPECREQUEST  12/08/2021 1205  ?  BOTTLES DRAWN AEROBIC AND ANAEROBIC Blood Culture adequate volume ?Performed  at [facility], [address] [place], [place] [address] ?  ? CULT  12/08/2021 1205  ?  NO GROWTH 5 DAYS ?Performed at [facility], [place] [address] [place], [place] [address] ?  ? REPTSTATUS 12/13/2021 FINAL 12/08/2021 1205  ? ? ?Cardiac Enzymes: ?No results for input(s): CKTOTAL, CKMB, CKMBINDEX, TROPONINI in the last 168 hours. ?CBG: ?No results for input(s): GLUCAP in the last 168 hours. ?Iron Studies: No results for input(s): IRON, TIBC, TRANSFERRIN, FERRITIN in the last 72 hours. ?'@lablastinr3'$ @ ?Studies/Results: ?No results found. ?Medications: ? sodium chloride Stopped (12/16/21 1900)  ? ? amiodarone  400 mg Oral BID  ? apixaban  5 mg Oral BID  ? feeding supplement  237 mL Oral BID BM  ? furosemide  40 mg Oral Daily  ? levothyroxine  200 mcg Oral Q0600  ? mouth rinse  15 mL Mouth Rinse BID  ? metoprolol succinate  50 mg Oral Daily  ?  mometasone-formoterol  2 puff Inhalation BID  ? polyethylene glycol  17 g Oral BID  ? potassium chloride  20 mEq Oral Daily  ? pravastatin  40 mg Oral q1800  ? sacubitril-valsartan  1 tablet Oral BID  ? senna-docusate  2 tablet Oral BID  ? sodium chloride flush  3 mL Intravenous Q12H  ? thiamine  125 mg Oral QPM  ? ? ?Assessment/Plan: ?**hyponatremia:  hypervolemic.  Initially quite hypervolemic and serum sodium improved with loop diuretics. Low urine sodium and osm > max dilute likely due to CHF.  Improved with diuretics as well as 2 doses of tolvaptan.  Stabilized on oral diuretic.  Diuretics per cardiology ? ?**HFrEF: EF 45% with mod MR.   On BB and entresto.  Cardiology following.   ? ?**A fib:  s/p DCCV but back in A fib. On eliquis. On BB and amiodarone. Cardiology following.  ? ?**HTN:  normotensive.  ? ?**hypokalemia: on KCL 20 daily, additional supplement PRN.  ? ?PT recommending home with HHPT. ? ?Given the patient's improved sodium we will sign off at this time.  Please do not hesitate to contact us if further help is needed. ? ? ? ? ?

## 2021-12-22 NOTE — Progress Notes (Signed)
Pt provided with heart failure booklet and RN educated about new HF diagnosis and self care.  All discharge questions answered to patient's satisfaction. ?

## 2021-12-22 NOTE — Progress Notes (Signed)
? ?Progress Note ? ?Patient Name: [name] ?Date of Encounter: 12/22/2021 ? ?[facility] Cardiologist: Dr Oval Linsey ? ?Subjective  ? ?Pt denies CP; dyspnea (maybe because of "anxiety") ? ?Inpatient Medications  ?  ?Scheduled Meds: ? amiodarone  400 mg Oral BID  ? apixaban  5 mg Oral BID  ? feeding supplement  237 mL Oral BID BM  ? furosemide  20 mg Oral Daily  ? levothyroxine  200 mcg Oral Q0600  ? mouth rinse  15 mL Mouth Rinse BID  ? metoprolol succinate  50 mg Oral Daily  ? mometasone-formoterol  2 puff Inhalation BID  ? polyethylene glycol  17 g Oral BID  ? potassium chloride  20 mEq Oral Daily  ? pravastatin  40 mg Oral q1800  ? sacubitril-valsartan  1 tablet Oral BID  ? senna-docusate  2 tablet Oral BID  ? sodium chloride flush  3 mL Intravenous Q12H  ? thiamine  125 mg Oral QPM  ? ?Continuous Infusions: ? sodium chloride Stopped (12/16/21 1900)  ? ?PRN Meds: ?sodium chloride, acetaminophen, albuterol, camphor-menthol, fluticasone, LORazepam, ondansetron (ZOFRAN) IV, sodium chloride flush  ? ?Vital Signs  ?  ?Vitals:  ? 12/21/21 2114 12/22/21 0015 12/22/21 0400 12/22/21 0534  ?BP: 106/61 116/72 128/67   ?Pulse:  (!) 105 93 (!) 112  ?Resp:  19 (!) 22 16  ?Temp: 97.6 ?F (36.4 ?C) 97.7 ?F (36.5 ?C) 97.6 ?F (36.4 ?C)   ?TempSrc: Oral Oral Oral   ?SpO2:  91% 95% 94%  ?Weight:    90.3 kg  ?Height:      ? ? ?Intake/Output Summary (Last 24 hours) at 12/22/2021 0729 ?Last data filed at 12/22/2021 0505 ?Gross per 24 hour  ?Intake 1080 ml  ?Output 1700 ml  ?Net -620 ml  ? ? ?Last 3 Weights 12/22/2021 12/21/2021 12/20/2021  ?Weight (lbs) 199 lb 1.2 oz 205 lb 11 oz 200 lb 6.4 oz  ?Weight (kg) 90.3 kg 93.3 kg 90.9 kg  ?   ? ?Telemetry  ?  ?Atrial fibrillation rate overall controlled - Personally Reviewed ? ?Physical Exam  ? ?GEN: NAD ?Neck: Supple ?Cardiac: irregular, no gallop ?Respiratory: CTA ?GI: Soft, NT/ND ?MS: No edema. ?Neuro:  Grossly intact ?Psych: Normal affect  ? ?Labs  ?  ?High Sensitivity Troponin:    ?Recent Labs  ?Lab 12/08/21 ?1158 12/12/21 ?0518  ?TROPONINIHS 6 10  ? ?   ?Chemistry ?Recent Labs  ?Lab 12/17/21 ?0037 12/18/21 ?0119 12/18/21 ?0732 12/18/21 ?0830 12/20/21 ?0107 12/20/21 ?1839 12/21/21 ?0203 12/22/21 ?5784  ?NA 124*   < >  --    < > 126* 128* 127* 128*  ?K 3.2*   < >  --    < > 4.2  --  4.3 4.3  ?CL 81*   < >  --    < > 89*  --  91* 93*  ?CO2 33*   < >  --    < > 29  --  27 27  ?GLUCOSE 108*   < >  --    < > 117*  --  116* 117*  ?BUN 18   < >  --    < > 18  --  21 22  ?CREATININE 0.88   < >  --    < > 1.05*  --  0.99 1.17*  ?CALCIUM 8.6*   < >  --    < > 8.9  --  9.0 8.7*  ?MG 1.5*  --   --   --   --   --   --   --   ?  PROT  --   --  6.0*  --   --   --   --   --   ?ALBUMIN 2.9*   < > 2.9*   < > 2.8*  --  2.8* 2.7*  ?AST  --   --  23  --   --   --   --   --   ?ALT  --   --  20  --   --   --   --   --   ?ALKPHOS  --   --  34  --   --   --   --   --   ?BILITOT  --   --  0.6  --   --   --   --   --   ?GFRNONAA >60   < >  --    < > 51*  --  55* 45*  ?ANIONGAP 10   < >  --    < > 8  --  9 8  ? < > = values in this interval not displayed.  ? ?  ? ?Hematology ?Recent Labs  ?Lab 12/19/21 ?0130  ?WBC 11.3*  ?RBC 4.79  ?HGB 13.1  ?HCT 38.9  ?MCV 81.2  ?MCH 27.3  ?MCHC 33.7  ?RDW 13.3  ?PLT 333  ? ? ? ?Patient Profile  ?   ?88 y.o. female with past medical history of hypertension, hyperlipidemia, newly diagnosed atrial fibrillation admitted with congestive heart failure and atrial fibrillation.  Underwent TEE guided cardioversion March 8 but reverted to atrial fibrillation March 14.  TEE this admission showed ejection fraction 45 to 50%, mild left atrial enlargement, left pleural effusion, moderate mitral regurgitation, mild to moderate tricuspid regurgitation, mild to moderate aortic insufficiency. ? ?Assessment & Plan  ?  ?1 persistent atrial fibrillation-patient remains in atrial fibrillation.  We will continue Toprol for rate control.  Continue apixaban.  Plan to continue amiodarone 400 mg twice daily to  complete 1 week.  Would then decrease to 200 mg daily.  Plan to proceed with cardioversion in 4 to 6 weeks.  Hopefully amiodarone will help her maintain sinus rhythm.   ? ?2 acute combined systolic/diastolic congestive heart failure-She will continue on Entresto and Toprol.  Some dyspnea this morning.  We will increase Lasix to 40 mg daily.  We will check potassium and renal function 1 week following discharge. ? ?3 cardiomyopathy-ejection fraction mildly reduced.  May be related to atrial fibrillation.  Can reassess once sinus is reestablished. ? ?4 hyponatremia-sodium increased to 128 today.  We will need close follow-up as an outpatient. ? ?5 hypothyroidism-levothyroxine dosing per primary service. ? ?Patient can be discharged from a cardiac standpoint on present medications.  We will arrange follow-up with APP in 2 weeks.  Check potassium and renal function 1 week. ? ?For questions or updates, please contact [facility] ?Please consult [email or web address] for contact info under  ? ?  ?   ?Signed, ?Kirk Ruths, MD  ?12/22/2021, 7:29 AM   ? ?

## 2021-12-24 ENCOUNTER — Emergency Department (HOSPITAL_BASED_OUTPATIENT_CLINIC_OR_DEPARTMENT_OTHER): Payer: Medicare HMO

## 2021-12-24 ENCOUNTER — Other Ambulatory Visit: Payer: Self-pay

## 2021-12-24 ENCOUNTER — Encounter (HOSPITAL_BASED_OUTPATIENT_CLINIC_OR_DEPARTMENT_OTHER): Payer: Self-pay

## 2021-12-24 ENCOUNTER — Emergency Department (HOSPITAL_BASED_OUTPATIENT_CLINIC_OR_DEPARTMENT_OTHER): Payer: Medicare HMO | Admitting: Radiology

## 2021-12-24 ENCOUNTER — Observation Stay (HOSPITAL_BASED_OUTPATIENT_CLINIC_OR_DEPARTMENT_OTHER)
Admission: EM | Admit: 2021-12-24 | Discharge: 2021-12-25 | Disposition: A | Discharge status: Home Health | Payer: Medicare HMO | Attending: Internal Medicine | Admitting: Internal Medicine

## 2021-12-24 DIAGNOSIS — R7402 Elevation of levels of lactic acid dehydrogenase (LDH): Secondary | ICD-10-CM | POA: Insufficient documentation

## 2021-12-24 DIAGNOSIS — R0600 Dyspnea, unspecified: Secondary | ICD-10-CM | POA: Diagnosis not present

## 2021-12-24 DIAGNOSIS — G9340 Encephalopathy, unspecified: Principal | ICD-10-CM | POA: Diagnosis present

## 2021-12-24 DIAGNOSIS — R457 State of emotional shock and stress, unspecified: Secondary | ICD-10-CM | POA: Diagnosis not present

## 2021-12-24 DIAGNOSIS — I4891 Unspecified atrial fibrillation: Secondary | ICD-10-CM | POA: Diagnosis not present

## 2021-12-24 DIAGNOSIS — Z20822 Contact with and (suspected) exposure to covid-19: Secondary | ICD-10-CM | POA: Diagnosis not present

## 2021-12-24 DIAGNOSIS — I10 Essential (primary) hypertension: Secondary | ICD-10-CM | POA: Diagnosis present

## 2021-12-24 DIAGNOSIS — I110 Hypertensive heart disease with heart failure: Secondary | ICD-10-CM | POA: Diagnosis not present

## 2021-12-24 DIAGNOSIS — N179 Acute kidney failure, unspecified: Secondary | ICD-10-CM | POA: Insufficient documentation

## 2021-12-24 DIAGNOSIS — I480 Paroxysmal atrial fibrillation: Secondary | ICD-10-CM | POA: Diagnosis not present

## 2021-12-24 DIAGNOSIS — E039 Hypothyroidism, unspecified: Secondary | ICD-10-CM | POA: Diagnosis not present

## 2021-12-24 DIAGNOSIS — F05 Delirium due to known physiological condition: Secondary | ICD-10-CM | POA: Diagnosis not present

## 2021-12-24 DIAGNOSIS — R4182 Altered mental status, unspecified: Secondary | ICD-10-CM | POA: Diagnosis not present

## 2021-12-24 DIAGNOSIS — R410 Disorientation, unspecified: Secondary | ICD-10-CM

## 2021-12-24 DIAGNOSIS — E785 Hyperlipidemia, unspecified: Secondary | ICD-10-CM

## 2021-12-24 DIAGNOSIS — R7989 Other specified abnormal findings of blood chemistry: Secondary | ICD-10-CM | POA: Insufficient documentation

## 2021-12-24 DIAGNOSIS — R69 Illness, unspecified: Secondary | ICD-10-CM | POA: Diagnosis not present

## 2021-12-24 DIAGNOSIS — N390 Urinary tract infection, site not specified: Secondary | ICD-10-CM | POA: Diagnosis not present

## 2021-12-24 DIAGNOSIS — I5042 Chronic combined systolic (congestive) and diastolic (congestive) heart failure: Secondary | ICD-10-CM | POA: Diagnosis not present

## 2021-12-24 DIAGNOSIS — J90 Pleural effusion, not elsewhere classified: Secondary | ICD-10-CM | POA: Diagnosis not present

## 2021-12-24 DIAGNOSIS — Z79899 Other long term (current) drug therapy: Secondary | ICD-10-CM | POA: Diagnosis not present

## 2021-12-24 DIAGNOSIS — E871 Hypo-osmolality and hyponatremia: Secondary | ICD-10-CM | POA: Insufficient documentation

## 2021-12-24 DIAGNOSIS — Z743 Need for continuous supervision: Secondary | ICD-10-CM | POA: Diagnosis not present

## 2021-12-24 DIAGNOSIS — Z7901 Long term (current) use of anticoagulants: Secondary | ICD-10-CM | POA: Insufficient documentation

## 2021-12-24 LAB — CBC WITH DIFFERENTIAL/PLATELET
Abs Immature Granulocytes: 0.03 10*3/uL (ref 0.00–0.07)
Basophils Absolute: 0.1 10*3/uL (ref 0.0–0.1)
Basophils Relative: 1 %
Eosinophils Absolute: 0.3 10*3/uL (ref 0.0–0.5)
Eosinophils Relative: 4 %
HCT: 39 % (ref 36.0–46.0)
Hemoglobin: 12.9 g/dL (ref 12.0–15.0)
Immature Granulocytes: 0 %
Lymphocytes Relative: 17 %
Lymphs Abs: 1.2 10*3/uL (ref 0.7–4.0)
MCH: 26.8 pg (ref 26.0–34.0)
MCHC: 33.1 g/dL (ref 30.0–36.0)
MCV: 81.1 fL (ref 80.0–100.0)
Monocytes Absolute: 0.8 10*3/uL (ref 0.1–1.0)
Monocytes Relative: 11 %
Neutro Abs: 4.7 10*3/uL (ref 1.7–7.7)
Neutrophils Relative %: 67 %
Platelets: 326 10*3/uL (ref 150–400)
RBC: 4.81 MIL/uL (ref 3.87–5.11)
RDW: 13.6 % (ref 11.5–15.5)
WBC: 7 10*3/uL (ref 4.0–10.5)
nRBC: 0 % (ref 0.0–0.2)

## 2021-12-24 LAB — URINALYSIS, ROUTINE W REFLEX MICROSCOPIC
Bilirubin Urine: NEGATIVE
Glucose, UA: NEGATIVE mg/dL
Ketones, ur: NEGATIVE mg/dL
Nitrite: NEGATIVE
Protein, ur: 30 mg/dL — AB
Specific Gravity, Urine: 1.007 (ref 1.005–1.030)
WBC, UA: >50 WBC/hpf — ABNORMAL HIGH (ref 0–5)
pH: 7 (ref 5.0–8.0)

## 2021-12-24 LAB — RESP PANEL BY RT-PCR (FLU A&B, COVID) ARPGX2
Influenza A by PCR: NEGATIVE
Influenza B by PCR: NEGATIVE
SARS Coronavirus 2 by RT PCR: NEGATIVE

## 2021-12-24 LAB — COMPREHENSIVE METABOLIC PANEL
ALT: 126 U/L — ABNORMAL HIGH (ref 0–44)
AST: 103 U/L — ABNORMAL HIGH (ref 15–41)
Albumin: 4 g/dL (ref 3.5–5.0)
Alkaline Phosphatase: 89 U/L (ref 38–126)
Anion gap: 13 (ref 5–15)
BUN: 24 mg/dL — ABNORMAL HIGH (ref 8–23)
CO2: 22 mmol/L (ref 22–32)
Calcium: 9.7 mg/dL (ref 8.9–10.3)
Chloride: 95 mmol/L — ABNORMAL LOW (ref 98–111)
Creatinine, Ser: 1.3 mg/dL — ABNORMAL HIGH (ref 0.44–1.00)
GFR, Estimated: 40 mL/min — ABNORMAL LOW (ref >60)
Glucose, Bld: 102 mg/dL — ABNORMAL HIGH (ref 70–99)
Potassium: 4.2 mmol/L (ref 3.5–5.1)
Sodium: 130 mmol/L — ABNORMAL LOW (ref 135–145)
Total Bilirubin: 0.5 mg/dL (ref 0.3–1.2)
Total Protein: 7.4 g/dL (ref 6.5–8.1)

## 2021-12-24 LAB — TROPONIN I (HIGH SENSITIVITY): Troponin I (High Sensitivity): 5 ng/L (ref <18)

## 2021-12-24 MED ORDER — SODIUM CHLORIDE 0.9 % IV SOLN
1.0000 g | Freq: Once | INTRAVENOUS | Status: AC
  Administered 2021-12-24: 1 g via INTRAVENOUS
  Filled 2021-12-24: qty 10

## 2021-12-24 MED ORDER — LORAZEPAM 1 MG PO TABS
1.0000 mg | ORAL_TABLET | Freq: Once | ORAL | Status: AC
  Administered 2021-12-24: 1 mg via ORAL
  Filled 2021-12-24: qty 1

## 2021-12-24 NOTE — ED Notes (Signed)
IV access attempted x2, unsuccessful  ?

## 2021-12-24 NOTE — ED Triage Notes (Signed)
Pt BIB [facility] from home for anxiety. Per EMS pt recently dc'd from [facility], pt had a dulera, pt used it today, gave herself 2 puffs, increasing her heart rate. Pt was "scared", family wanted her evaluated d/t recent dx of A-fib and said increased HR. Pt calm and cooperative during triage. Pt has an RX for Ativan at bedtime. Pt has a tele health visit 12/31/2021.  ? ?BP 128/70 ?HR 89 ?98% RA  ?RR 16 ?

## 2021-12-24 NOTE — ED Notes (Addendum)
[name], pt's daughter , put pt's watch in her pocket book  ?

## 2021-12-24 NOTE — ED Notes (Signed)
Patient transported to CT 

## 2021-12-24 NOTE — ED Provider Notes (Signed)
? ?Emergency Department Provider Note ? ? ?I have reviewed the triage vital signs and the nursing notes. ? ? ?HISTORY ? ?Chief Complaint ?Anxiety ? ? ?HPI ?[name] is a 88 y.o. female with PMH of a fib s/p recent admission for similar presents to the emergency department for self-described "panic attack." Patient arrives by EMS. Daughter is at bedside and reports two episodes of SOB with feeling of dread and fatigue. She has been taking her medications and overall feeling well but daughter does note that she missed her AM doses of Metoprolol, Lasix, and thyroid medication due to a med admit error by her husband. She denies any symptoms currently. Daughter notes that she typically takes ativan. Family note that in addition the above symptoms she has been much more confused today. She is getting the day of the week wrong and asking about family/friends who have Deano Tomaszewski since passed away.  ? ? ? ?Past Medical History:  ?Diagnosis Date  ? Dyspnea   ? diastolic dysfunction by echo 2012  ? GERD (gastroesophageal reflux disease)   ? Hypertension   ? Mild mitral and aortic regurgitation   ? Osteoarthritis   ? Right bundle branch block 2014  ? Thyroid disease   ? ? ?Review of Systems ? ?Constitutional: No fever/chills. Positive confusion.  ?Eyes: No visual changes. ?ENT: No sore throat. ?Cardiovascular: Denies chest pain. ?Respiratory: Positive intermittent shortness of breath. ?Gastrointestinal: No abdominal pain.  No nausea, no vomiting.  No diarrhea.  No constipation. ?Genitourinary: Negative for dysuria. ?Musculoskeletal: Negative for back pain. ?Skin: Negative for rash. ?Neurological: Negative for headaches, focal weakness or numbness. ? ? ?____________________________________________ ? ? ?PHYSICAL EXAM: ? ?VITAL SIGNS: ?ED Triage Vitals  ?Enc Vitals Group  ?   BP 12/24/21 1635 123/82  ?   Pulse Rate 12/24/21 1635 88  ?   Resp 12/24/21 1635 18  ?   Temp 12/24/21 1635 97.9 ?F (36.6 ?C)  ?   Temp Source 12/24/21  1635 Oral  ?   SpO2 12/24/21 1635 97 %  ? ?Constitutional: Alert but anxious appearing and confused at times. Intermittently agitated.  ?Eyes: Conjunctivae are normal.  ?Head: Atraumatic. ?Nose: No congestion/rhinnorhea. ?Mouth/Throat: Mucous membranes are moist.   ?Neck: No stridor. ?Cardiovascular: Normal rate, regular rhythm. Good peripheral circulation. Grossly normal heart sounds.   ?Respiratory: Normal respiratory effort.  No retractions. Lungs CTAB. ?Gastrointestinal: Soft and nontender. No distention.  ?Musculoskeletal: No gross deformities of extremities. ?Neurologic:  Normal speech and language.  ?Skin:  Skin is warm, dry and intact. No rash noted. ? ? ?____________________________________________ ?  ?LABS ?(all labs ordered are listed, but only abnormal results are displayed) ? ?Labs Reviewed  ?COMPREHENSIVE METABOLIC PANEL - Abnormal; Notable for the following components:  ?    Result Value  ? Sodium 130 (*)   ? Chloride 95 (*)   ? Glucose, Bld 102 (*)   ? BUN 24 (*)   ? Creatinine, Ser 1.30 (*)   ? AST 103 (*)   ? ALT 126 (*)   ? GFR, Estimated 40 (*)   ? All other components within normal limits  ?URINALYSIS, ROUTINE W REFLEX MICROSCOPIC - Abnormal; Notable for the following components:  ? APPearance HAZY (*)   ? Hgb urine dipstick MODERATE (*)   ? Protein, ur 30 (*)   ? Leukocytes,Ua LARGE (*)   ? WBC, UA >50 (*)   ? Bacteria, UA RARE (*)   ? Non Squamous Epithelial 0-5 (*)   ?  All other components within normal limits  ?COMPREHENSIVE METABOLIC PANEL - Abnormal; Notable for the following components:  ? Sodium 128 (*)   ? Chloride 97 (*)   ? CO2 21 (*)   ? Glucose, Bld 104 (*)   ? Creatinine, Ser 1.04 (*)   ? Calcium 8.8 (*)   ? Total Protein 6.3 (*)   ? Albumin 3.2 (*)   ? AST 70 (*)   ? ALT 96 (*)   ? GFR, Estimated 52 (*)   ? All other components within normal limits  ?CBC - Abnormal; Notable for the following components:  ? Hemoglobin 11.6 (*)   ? HCT 35.9 (*)   ? All other components within normal  limits  ?RESP PANEL BY RT-PCR (FLU A&B, COVID) ARPGX2  ?URINE CULTURE  ?CBC WITH DIFFERENTIAL/PLATELET  ?AMMONIA  ?HEPATITIS PANEL, ACUTE  ?TROPONIN I (HIGH SENSITIVITY)  ? ?____________________________________________ ? ?EKG ? ? EKG Interpretation ? ?Date/Time:  Tuesday December 24 2021 16:57:52 EDT ?Ventricular Rate:  89 ?PR Interval:    ?QRS Duration: 140 ?QT Interval:  391 ?QTC Calculation: 476 ?R Axis:   10 ?Text Interpretation: Atrial fibrillation Right bundle branch block Confirmed by Nanda Quinton [phone]) on 12/24/2021 6:15:01 PM ?  ? ?  ? ? ?____________________________________________ ? ?RADIOLOGY ? ?DG Chest 2 View ? ?Result Date: 12/24/2021 ?CLINICAL DATA:  Dyspnea EXAM: CHEST - 2 VIEW COMPARISON:  12/12/2021 chest radiograph. FINDINGS: Stable cardiomediastinal silhouette with normal heart size. No pneumothorax. Small left pleural effusion and trace right pleural effusion, slightly decreased. No overt pulmonary edema. Hazy left retrocardiac opacity, similar. IMPRESSION: 1. Small left and trace right pleural effusions, slightly decreased. 2. Stable hazy left retrocardiac opacity, favor left lower lobe atelectasis. Electronically Signed   By: Ilona Sorrel M.D.   On: 12/24/2021 17:18  ? ?DG Cervical Spine 2 or 3 views ? ?Result Date: 12/25/2021 ?CLINICAL DATA:  Right shoulder neck pain since last night, no injury. EXAM: CERVICAL SPINE - 2-3 VIEW COMPARISON:  None. FINDINGS: The cervical spine is visualized from the occiput to the cervicothoracic junction. Minimal straightening of the normal cervical lordosis. Alignment is otherwise anatomic. Vertebral body height is maintained. Prevertebral soft tissues are within normal limits. Multilevel uncovertebral hypertrophy and facet sclerosis. Anterior marginal osteophytosis from C3-4 to C6-7. Probable loss of disc space height at C6-7. Visualized lung apices are grossly clear. IMPRESSION: 1. No acute findings. 2. Multilevel degenerative disc disease with uncovertebral  and facet hypertrophy. Electronically Signed   By: Lorin Picket M.D.   On: 12/25/2021 08:54  ? ?DG Shoulder Right ? ?Result Date: 12/25/2021 ?CLINICAL DATA:  Right shoulder and neck pain since last night. No known injury. EXAM: RIGHT SHOULDER - 2+ VIEW COMPARISON:  None. FINDINGS: There is diffuse decreased bone mineralization. The glenohumeral joint is maintained. Moderate acromioclavicular joint space narrowing and peripheral osteophytosis. Moderate distal lateral subacromial spurring. No acute fracture is seen. No dislocation. The visualized portion of the right lung is unremarkable. IMPRESSION: Moderate right acromioclavicular osteoarthritis with moderate distal lateral subacromial spurring. Electronically Signed   By: Yvonne Kendall M.D.   On: 12/25/2021 08:56  ? ?CT Head Wo Contrast ? ?Result Date: 12/24/2021 ?CLINICAL DATA:  Mental status change, unknown cause EXAM: CT HEAD WITHOUT CONTRAST TECHNIQUE: Contiguous axial images were obtained from the base of the skull through the vertex without intravenous contrast. RADIATION DOSE REDUCTION: This exam was performed according to the departmental dose-optimization program which includes automated exposure control, adjustment of the mA and/or kV  according to patient size and/or use of iterative reconstruction technique. COMPARISON:  [place] [address] FINDINGS: Brain: Age related atrophy. No intracranial hemorrhage, mass effect, or midline shift. No hydrocephalus. The basilar cisterns are patent. Mild to moderate periventricular chronic small vessel ischemia. No evidence of territorial infarct or acute ischemia. No extra-axial or intracranial fluid collection. Vascular: Atherosclerosis of skullbase vasculature without hyperdense vessel or abnormal calcification. Skull: No fracture or focal lesion. Sinuses/Orbits: Bilateral cataract resection.  No acute findings. Other: None. IMPRESSION: 1. No acute intracranial abnormality. 2. Age related atrophy and  chronic small vessel ischemia. Electronically Signed   By: Keith Rake M.D.   On: 12/24/2021 20:08   ? ?____________________________________________ ? ? ?PROCEDURES ? ?Procedure(s) performed:  ? ?Proced

## 2021-12-25 ENCOUNTER — Encounter (HOSPITAL_COMMUNITY): Payer: Self-pay | Admitting: Internal Medicine

## 2021-12-25 ENCOUNTER — Observation Stay (HOSPITAL_COMMUNITY): Payer: Medicare HMO

## 2021-12-25 DIAGNOSIS — G9340 Encephalopathy, unspecified: Secondary | ICD-10-CM | POA: Diagnosis present

## 2021-12-25 DIAGNOSIS — I480 Paroxysmal atrial fibrillation: Secondary | ICD-10-CM | POA: Diagnosis present

## 2021-12-25 DIAGNOSIS — M2578 Osteophyte, vertebrae: Secondary | ICD-10-CM | POA: Diagnosis not present

## 2021-12-25 DIAGNOSIS — E785 Hyperlipidemia, unspecified: Secondary | ICD-10-CM

## 2021-12-25 DIAGNOSIS — M25711 Osteophyte, right shoulder: Secondary | ICD-10-CM | POA: Diagnosis not present

## 2021-12-25 DIAGNOSIS — M542 Cervicalgia: Secondary | ICD-10-CM | POA: Diagnosis not present

## 2021-12-25 DIAGNOSIS — N3000 Acute cystitis without hematuria: Secondary | ICD-10-CM | POA: Diagnosis not present

## 2021-12-25 DIAGNOSIS — M19011 Primary osteoarthritis, right shoulder: Secondary | ICD-10-CM | POA: Diagnosis not present

## 2021-12-25 LAB — CBC
HCT: 35.9 % — ABNORMAL LOW (ref 36.0–46.0)
Hemoglobin: 11.6 g/dL — ABNORMAL LOW (ref 12.0–15.0)
MCH: 27 pg (ref 26.0–34.0)
MCHC: 32.3 g/dL (ref 30.0–36.0)
MCV: 83.7 fL (ref 80.0–100.0)
Platelets: 313 10*3/uL (ref 150–400)
RBC: 4.29 MIL/uL (ref 3.87–5.11)
RDW: 13.8 % (ref 11.5–15.5)
WBC: 9.6 10*3/uL (ref 4.0–10.5)
nRBC: 0 % (ref 0.0–0.2)

## 2021-12-25 LAB — HEPATITIS PANEL, ACUTE
HCV Ab: NONREACTIVE
Hep A IgM: NONREACTIVE
Hep B C IgM: NONREACTIVE
Hepatitis B Surface Ag: NONREACTIVE

## 2021-12-25 LAB — COMPREHENSIVE METABOLIC PANEL
ALT: 96 U/L — ABNORMAL HIGH (ref 0–44)
AST: 70 U/L — ABNORMAL HIGH (ref 15–41)
Albumin: 3.2 g/dL — ABNORMAL LOW (ref 3.5–5.0)
Alkaline Phosphatase: 80 U/L (ref 38–126)
Anion gap: 10 (ref 5–15)
BUN: 22 mg/dL (ref 8–23)
CO2: 21 mmol/L — ABNORMAL LOW (ref 22–32)
Calcium: 8.8 mg/dL — ABNORMAL LOW (ref 8.9–10.3)
Chloride: 97 mmol/L — ABNORMAL LOW (ref 98–111)
Creatinine, Ser: 1.04 mg/dL — ABNORMAL HIGH (ref 0.44–1.00)
GFR, Estimated: 52 mL/min — ABNORMAL LOW (ref >60)
Glucose, Bld: 104 mg/dL — ABNORMAL HIGH (ref 70–99)
Potassium: 4.4 mmol/L (ref 3.5–5.1)
Sodium: 128 mmol/L — ABNORMAL LOW (ref 135–145)
Total Bilirubin: 0.3 mg/dL (ref 0.3–1.2)
Total Protein: 6.3 g/dL — ABNORMAL LOW (ref 6.5–8.1)

## 2021-12-25 LAB — AMMONIA: Ammonia: 18 umol/L (ref 9–35)

## 2021-12-25 MED ORDER — SACUBITRIL-VALSARTAN 24-26 MG PO TABS
1.0000 | ORAL_TABLET | Freq: Two times a day (BID) | ORAL | Status: DC
  Administered 2021-12-25: 1 via ORAL
  Filled 2021-12-25: qty 1

## 2021-12-25 MED ORDER — AMIODARONE HCL 200 MG PO TABS
200.0000 mg | ORAL_TABLET | Freq: Every day | ORAL | Status: DC
Start: 2021-12-28 — End: 2021-12-25

## 2021-12-25 MED ORDER — FLUTICASONE PROPIONATE 50 MCG/ACT NA SUSP
1.0000 | Freq: Every day | NASAL | Status: DC | PRN
  Filled 2021-12-25: qty 16

## 2021-12-25 MED ORDER — FUROSEMIDE 20 MG PO TABS
20.0000 mg | ORAL_TABLET | Freq: Every day | ORAL | 0 refills | Status: DC
Start: 2021-12-25 — End: 2022-01-09

## 2021-12-25 MED ORDER — IBUPROFEN 200 MG PO TABS
200.0000 mg | ORAL_TABLET | Freq: Once | ORAL | Status: AC
  Administered 2021-12-25: 200 mg via ORAL
  Filled 2021-12-25: qty 1

## 2021-12-25 MED ORDER — FUROSEMIDE 40 MG PO TABS
40.0000 mg | ORAL_TABLET | Freq: Every day | ORAL | Status: DC
  Administered 2021-12-25: 40 mg via ORAL
  Filled 2021-12-25: qty 1

## 2021-12-25 MED ORDER — POTASSIUM CHLORIDE CRYS ER 20 MEQ PO TBCR
20.0000 meq | EXTENDED_RELEASE_TABLET | Freq: Every day | ORAL | Status: DC
  Administered 2021-12-25: 20 meq via ORAL
  Filled 2021-12-25: qty 1

## 2021-12-25 MED ORDER — AMIODARONE HCL 200 MG PO TABS
400.0000 mg | ORAL_TABLET | Freq: Two times a day (BID) | ORAL | Status: DC
  Administered 2021-12-25: 400 mg via ORAL
  Filled 2021-12-25: qty 2

## 2021-12-25 MED ORDER — AMIODARONE HCL 200 MG PO TABS: ORAL_TABLET | ORAL | 1 refills | Status: DC

## 2021-12-25 MED ORDER — SODIUM CHLORIDE 0.9 % IV SOLN
2.0000 g | INTRAVENOUS | Status: DC
  Administered 2021-12-25: 2 g via INTRAVENOUS
  Filled 2021-12-25: qty 20

## 2021-12-25 MED ORDER — LEVOTHYROXINE SODIUM 100 MCG PO TABS
200.0000 ug | ORAL_TABLET | Freq: Every day | ORAL | Status: DC
  Administered 2021-12-25: 200 ug via ORAL
  Filled 2021-12-25: qty 2

## 2021-12-25 MED ORDER — SENNOSIDES-DOCUSATE SODIUM 8.6-50 MG PO TABS: 1.0000 | ORAL_TABLET | Freq: Every evening | ORAL | Status: DC | PRN

## 2021-12-25 MED ORDER — CEFDINIR 300 MG PO CAPS: 300.0000 mg | ORAL_CAPSULE | Freq: Two times a day (BID) | ORAL | 0 refills | Status: DC

## 2021-12-25 MED ORDER — LORAZEPAM 0.5 MG PO TABS: 0.5000 mg | ORAL_TABLET | Freq: Every day | ORAL | 0 refills | Status: DC | PRN

## 2021-12-25 MED ORDER — AMIODARONE HCL 200 MG PO TABS: 400.0000 mg | ORAL_TABLET | Freq: Two times a day (BID) | ORAL | Status: DC

## 2021-12-25 MED ORDER — THIAMINE HCL 100 MG PO TABS: 100.0000 mg | ORAL_TABLET | Freq: Every evening | ORAL | Status: DC

## 2021-12-25 MED ORDER — PANTOPRAZOLE SODIUM 40 MG PO TBEC
40.0000 mg | DELAYED_RELEASE_TABLET | Freq: Every day | ORAL | Status: DC
Start: 2021-12-25 — End: 2021-12-25
  Administered 2021-12-25: 40 mg via ORAL
  Filled 2021-12-25: qty 1

## 2021-12-25 MED ORDER — LORAZEPAM 0.5 MG PO TABS
0.5000 mg | ORAL_TABLET | Freq: Every day | ORAL | Status: DC | PRN
  Administered 2021-12-25: 0.5 mg via ORAL
  Filled 2021-12-25: qty 1

## 2021-12-25 MED ORDER — VITAMIN B-1 50 MG PO TABS
125.0000 mg | ORAL_TABLET | Freq: Every evening | ORAL | Status: DC
  Filled 2021-12-25: qty 1

## 2021-12-25 MED ORDER — VITAMIN D 50 MCG (2000 UT) PO CAPS: 2000.0000 [IU] | ORAL_CAPSULE | Freq: Every day | ORAL | Status: DC

## 2021-12-25 MED ORDER — APIXABAN 5 MG PO TABS
5.0000 mg | ORAL_TABLET | Freq: Two times a day (BID) | ORAL | Status: DC
  Administered 2021-12-25 (×2): 5 mg via ORAL
  Filled 2021-12-25 (×2): qty 1

## 2021-12-25 MED ORDER — VITAMIN B-12 1000 MCG PO TABS
1000.0000 ug | ORAL_TABLET | Freq: Every day | ORAL | Status: DC
  Administered 2021-12-25: 1000 ug via ORAL
  Filled 2021-12-25: qty 1

## 2021-12-25 MED ORDER — METOPROLOL SUCCINATE ER 50 MG PO TB24
50.0000 mg | ORAL_TABLET | Freq: Every day | ORAL | Status: DC
  Administered 2021-12-25: 50 mg via ORAL
  Filled 2021-12-25: qty 1

## 2021-12-25 MED ORDER — PRAVASTATIN SODIUM 40 MG PO TABS
40.0000 mg | ORAL_TABLET | Freq: Every day | ORAL | Status: DC
Start: 2021-12-25 — End: 2021-12-25

## 2021-12-25 NOTE — TOC Transition Note (Signed)
Transition of Care (TOC) - CM/SW Discharge Note ? ? ?Patient Details  ?Name: [name] ?MRN: [id number] ?Date of Birth: 1930-12-30 ? ?Transition of Care (TOC) CM/SW Contact:  ?Trish Mage, LCSW ?Phone Number: ?12/25/2021, 11:17 AM ? ? ?Clinical Narrative:   Confirmed with [name] with [facility] that they will resume services at d/c. Orders seen and appreciated. No further needs identified. TOC sign off. ? ? ? ?Final next level of care: [facility] ?  ? ? ?Patient Goals and CMS Choice ?  ?  ?  ? ?Discharge Placement ?  ?           ?  ?  ?  ?  ? ?Discharge Plan and Services ?  ?  ?           ?  ?  ?  ?  ?  ?  ?  ?  ?  ?  ? ?Social Determinants of Health (SDOH) Interventions ?  ? ? ?Readmission Risk Interventions ?   ? View : No data to display.  ?  ?  ?  ? ? ? ? ? ?

## 2021-12-25 NOTE — Assessment & Plan Note (Signed)
Called office ?Last time labs March 18 2021 ?LDL 70.  Will hold lovastatin.  ?

## 2021-12-25 NOTE — H&P (Addendum)
History and Physical    [name]:[id number] DOB: 1931-01-20 DOA: 12/24/2021  PCP: Pearson Grippe, MD  Patient coming from: Home.  Chief Complaint: Anxiety and confusion.  HPI: [name] is a 88 y.o. female with recent diagnosis of A-fib, systolic CHF admitted recently for congestive heart failure and A-fib with RVR hyponatremia was brought back to the ER yesterday because of anxiety attacks with.'s of confusion.  Denies any chest pain no new shortness of breath denies any nausea vomiting abdominal pain or diarrhea.  During last admission patient also had cardioversion but patient reverted back to A-fib with RVR.  ED Course: In the ER patient had CT head which is unremarkable and UA was concerning for UTI.  Patient also was given 1 dose of lorazepam.  Chest x-ray does show pleural effusion and retrocardiac opacity.  Retrocardiac opacity has been persistent.  Patient was started on ceftriaxone admitted for further management of UTI encephalopathy and observation.  COVID test was negative.  Review of Systems: As per HPI, rest all negative.   Past Medical History:  Diagnosis Date   Dyspnea    diastolic dysfunction by echo 2012   GERD (gastroesophageal reflux disease)    Hypertension    Mild mitral and aortic regurgitation    Osteoarthritis    Right bundle branch block 2014   Thyroid disease     Past Surgical History:  Procedure Laterality Date   arm surgery     following an accident   CARDIOVERSION N/A 12/11/2021   Procedure: CARDIOVERSION;  Surgeon: Christell Constant, MD;  Location: [facility] ENDOSCOPY;  Service: Cardiovascular;  Laterality: N/A;   KNEE SURGERY     Following an accident   TEE WITHOUT CARDIOVERSION N/A 12/11/2021   Procedure: TRANSESOPHAGEAL ECHOCARDIOGRAM (TEE);  Surgeon: Christell Constant, MD;  Location: [facility] ENDOSCOPY;  Service: Cardiovascular;  Laterality: N/A;   TONSILLECTOMY AND ADENOIDECTOMY     VAGINAL HYSTERECTOMY       reports that she  has never smoked. She has never used smokeless tobacco. She reports that she does not drink alcohol and does not use drugs.  No Known Allergies  Family History  Problem Relation Age of Onset   Heart attack Mother     Prior to Admission medications   Medication Sig Start Date End Date Taking? Authorizing Provider  amiodarone (PACERONE) 200 MG tablet Take 2 tablets (400 mg total) by mouth 2 (two) times daily. Take 2tabs twice a day for 6days then 1 tab daily 12/22/21   Zannie Cove, MD  apixaban (ELIQUIS) 5 MG TABS tablet Take 1 tablet (5 mg total) by mouth 2 (two) times daily. 12/22/21   Zannie Cove, MD  Calcium Carbonate-Vitamin D 500-125 MG-UNIT TABS Take 1 tablet by mouth every evening.    [provider]  Cholecalciferol (VITAMIN D) 50 MCG (2000 UT) CAPS Take 2,000 Units by mouth daily. 03/26/21   [provider]  fluticasone (FLONASE) 50 MCG/ACT nasal spray Place 1 spray into both nostrils daily as needed for allergies or rhinitis.    [provider]  furosemide (LASIX) 40 MG tablet Take 1 tablet (40 mg total) by mouth daily. 12/22/21   Zannie Cove, MD  levothyroxine (SYNTHROID) 200 MCG tablet Take 1 tablet (200 mcg total) by mouth daily before breakfast. 12/22/21   Zannie Cove, MD  LORazepam (ATIVAN) 0.5 MG tablet Take 0.5 mg by mouth daily as needed for anxiety. 03/18/21   [provider]  lovastatin (MEVACOR) 40 MG tablet Take  60 mg by mouth at bedtime.    [provider]  metoprolol succinate (TOPROL-XL) 50 MG 24 hr tablet Take 1 tablet (50 mg total) by mouth daily. Take with or immediately following a meal. 12/22/21   Zannie Cove, MD  Multiple Vitamin (MULTIVITAMIN WITH MINERALS) TABS tablet Take 1 tablet by mouth every evening.    [provider]  Multiple Vitamins-Minerals (OCUVITE EYE HEALTH FORMULA) CAPS Take 1 capsule by mouth every evening.    [provider]  Omega-3 Fatty Acids (FISH OIL) 1200 MG CAPS  Take by mouth.    [provider]  omeprazole (PRILOSEC) 10 MG capsule Take 10 mg by mouth every evening. 10/16/21   [provider]  potassium chloride SA (KLOR-CON M) 20 MEQ tablet Take 1 tablet (20 mEq total) by mouth daily. 12/22/21   Zannie Cove, MD  sacubitril-valsartan (ENTRESTO) 24-26 MG Take 1 tablet by mouth 2 (two) times daily. 12/22/21   Zannie Cove, MD  senna-docusate (SENOKOT-S) 8.6-50 MG tablet Take 1 tablet by mouth at bedtime as needed for mild constipation. 12/22/21   Zannie Cove, MD  Thiamine HCl (VITAMIN B-1) 250 MG tablet Take 125 mg by mouth every evening.    [provider]  vitamin B-12 (CYANOCOBALAMIN) 1000 MCG tablet Take 1,000 mcg by mouth daily.    [provider]    Physical Exam: Constitutional: Moderately built and nourished. Vitals:   12/24/21 2125 12/24/21 2200 12/24/21 2225 12/25/21 0026  BP:  131/65  123/77  Pulse:   (!) 52 100  Resp:  (!) 28 20 18   Temp:    97.7 F (36.5 C)  TempSrc:      SpO2: 99%  100% 99%   Eyes: Anicteric no pallor. ENMT: No discharge from the ears eyes nose and mouth. Neck: No mass felt.  No neck rigidity. Respiratory: No rhonchi or crepitations. Cardiovascular: S1-S2 heard. Abdomen: Soft nontender bowel sound present. Musculoskeletal: No edema. Skin: No rash. Neurologic: Alert awake oriented time place and person.  Moves all extremities. Psychiatric: Appears normal.  Normal affect.   Labs on Admission: I have personally reviewed following labs and imaging studies  CBC: Recent Labs  Lab 12/19/21 0130 12/24/21 1739  WBC 11.3* 7.0  NEUTROABS  --  4.7  HGB 13.1 12.9  HCT 38.9 39.0  MCV 81.2 81.1  PLT 333 326   Basic Metabolic Panel: Recent Labs  Lab 12/18/21 0119 12/18/21 0830 12/19/21 0130 12/19/21 1903 12/20/21 0107 12/20/21 1839 12/21/21 0203 12/22/21 0049 12/24/21 1739  NA 122*   < > 121*   < > 126* 128* 127* 128* 130*  K 4.2   < > 4.1  --  4.2  --  4.3  4.3 4.2  CL 83*   < > 84*  --  89*  --  91* 93* 95*  CO2 28   < > 26  --  29  --  27 27 22   GLUCOSE 114*   < > 103*  --  117*  --  116* 117* 102*  BUN 24*   < > 22  --  18  --  21 22 24*  CREATININE 0.94   < > 0.89  --  1.05*  --  0.99 1.17* 1.30*  CALCIUM 8.5*   < > 8.8*  --  8.9  --  9.0 8.7* 9.7  PHOS 3.4  --  3.5  --  4.2  --  3.9 4.3  --    < > =  values in this interval not displayed.   GFR: Estimated Creatinine Clearance: 34.2 mL/min (A) (by C-G formula based on SCr of 1.3 mg/dL (H)). Liver Function Tests: Recent Labs  Lab 12/18/21 0732 12/19/21 0130 12/20/21 0107 12/21/21 0203 12/22/21 0049 12/24/21 1739  AST 23  --   --   --   --  103*  ALT 20  --   --   --   --  126*  ALKPHOS 71  --   --   --   --  89  BILITOT 0.6  --   --   --   --  0.5  PROT 6.0*  --   --   --   --  7.4  ALBUMIN 2.9* 2.8* 2.8* 2.8* 2.7* 4.0   No results for input(s): LIPASE, AMYLASE in the last 168 hours. No results for input(s): AMMONIA in the last 168 hours. Coagulation Profile: No results for input(s): INR, PROTIME in the last 168 hours. Cardiac Enzymes: No results for input(s): CKTOTAL, CKMB, CKMBINDEX, TROPONINI in the last 168 hours. BNP (last 3 results) No results for input(s): PROBNP in the last 8760 hours. HbA1C: No results for input(s): HGBA1C in the last 72 hours. CBG: No results for input(s): GLUCAP in the last 168 hours. Lipid Profile: No results for input(s): CHOL, HDL, LDLCALC, TRIG, CHOLHDL, LDLDIRECT in the last 72 hours. Thyroid Function Tests: No results for input(s): TSH, T4TOTAL, FREET4, T3FREE, THYROIDAB in the last 72 hours. Anemia Panel: No results for input(s): VITAMINB12, FOLATE, FERRITIN, TIBC, IRON, RETICCTPCT in the last 72 hours. Urine analysis:    Component Value Date/Time   COLORURINE YELLOW 12/24/2021 1910   APPEARANCEUR HAZY (A) 12/24/2021 1910   LABSPEC 1.007 12/24/2021 1910   PHURINE 7.0 12/24/2021 1910   GLUCOSEU NEGATIVE 12/24/2021 1910   HGBUR  MODERATE (A) 12/24/2021 1910   BILIRUBINUR NEGATIVE 12/24/2021 1910   KETONESUR NEGATIVE 12/24/2021 1910   PROTEINUR 30 (A) 12/24/2021 1910   NITRITE NEGATIVE 12/24/2021 1910   LEUKOCYTESUR LARGE (A) 12/24/2021 1910   Sepsis Labs: @LABRCNTIP (procalcitonin:4,lacticidven:4) ) Recent Results (from the past 240 hour(s))  Resp Panel by RT-PCR (Flu A&B, Covid) Nasopharyngeal Swab     Status: None   Collection Time: 12/24/21  7:23 PM   Specimen: Nasopharyngeal Swab; Nasopharyngeal(NP) swabs in vial transport medium  Result Value Ref Range Status   SARS Coronavirus 2 by RT PCR NEGATIVE NEGATIVE Final    Comment: (NOTE) SARS-CoV-2 target nucleic acids are NOT DETECTED.  The SARS-CoV-2 RNA is generally detectable in upper respiratory specimens during the acute phase of infection. The lowest concentration of SARS-CoV-2 viral copies this assay can detect is 138 copies/mL. A negative result does not preclude SARS-Cov-2 infection and should not be used as the sole basis for treatment or other patient management decisions. A negative result may occur with  improper specimen collection/handling, submission of specimen other than nasopharyngeal swab, presence of viral mutation(s) within the areas targeted by this assay, and inadequate number of viral copies(<138 copies/mL). A negative result must be combined with clinical observations, patient history, and epidemiological information. The expected result is Negative.  Fact Sheet for Patients:  BloggerCourse.com  Fact Sheet for Healthcare Providers:  SeriousBroker.it  This test is no t yet approved or cleared by the [place] FDA and  has been authorized for detection and/or diagnosis of SARS-CoV-2 by FDA under an Emergency Use Authorization (EUA). This EUA will remain  in effect (meaning this test can be used) for the duration  of the COVID-19 declaration under Section 564(b)(1) of the Act,  21 U.S.C.section 360bbb-3(b)(1), unless the authorization is terminated  or revoked sooner.       Influenza A by PCR NEGATIVE NEGATIVE Final   Influenza B by PCR NEGATIVE NEGATIVE Final    Comment: (NOTE) The Xpert Xpress SARS-CoV-2/FLU/RSV plus assay is intended as an aid in the diagnosis of influenza from Nasopharyngeal swab specimens and should not be used as a sole basis for treatment. Nasal washings and aspirates are unacceptable for Xpert Xpress SARS-CoV-2/FLU/RSV testing.  Fact Sheet for Patients: BloggerCourse.com  Fact Sheet for Healthcare Providers: SeriousBroker.it  This test is not yet approved or cleared by the [place] FDA and has been authorized for detection and/or diagnosis of SARS-CoV-2 by FDA under an Emergency Use Authorization (EUA). This EUA will remain in effect (meaning this test can be used) for the duration of the COVID-19 declaration under Section 564(b)(1) of the Act, 21 U.S.C. section 360bbb-3(b)(1), unless the authorization is terminated or revoked.  Performed at [facility], [address], [place], [place] [address]      Radiological Exams on Admission: DG Chest 2 View  Result Date: 12/24/2021 CLINICAL DATA:  Dyspnea EXAM: CHEST - 2 VIEW COMPARISON:  12/12/2021 chest radiograph. FINDINGS: Stable cardiomediastinal silhouette with normal heart size. No pneumothorax. Small left pleural effusion and trace right pleural effusion, slightly decreased. No overt pulmonary edema. Hazy left retrocardiac opacity, similar. IMPRESSION: 1. Small left and trace right pleural effusions, slightly decreased. 2. Stable hazy left retrocardiac opacity, favor left lower lobe atelectasis. Electronically Signed   By: Delbert Phenix M.D.   On: 12/24/2021 17:18   CT Head Wo Contrast  Result Date: 12/24/2021 CLINICAL DATA:  Mental status change, unknown cause EXAM: CT HEAD WITHOUT CONTRAST TECHNIQUE:  Contiguous axial images were obtained from the base of the skull through the vertex without intravenous contrast. RADIATION DOSE REDUCTION: This exam was performed according to the departmental dose-optimization program which includes automated exposure control, adjustment of the mA and/or kV according to patient size and/or use of iterative reconstruction technique. COMPARISON:  [place] [address] FINDINGS: Brain: Age related atrophy. No intracranial hemorrhage, mass effect, or midline shift. No hydrocephalus. The basilar cisterns are patent. Mild to moderate periventricular chronic small vessel ischemia. No evidence of territorial infarct or acute ischemia. No extra-axial or intracranial fluid collection. Vascular: Atherosclerosis of skullbase vasculature without hyperdense vessel or abnormal calcification. Skull: No fracture or focal lesion. Sinuses/Orbits: Bilateral cataract resection.  No acute findings. Other: None. IMPRESSION: 1. No acute intracranial abnormality. 2. Age related atrophy and chronic small vessel ischemia. Electronically Signed   By: Narda Rutherford M.D.   On: 12/24/2021 20:08    EKG: Independently reviewed.  A-fib rate controlled.  Assessment/Plan Principal Problem:   Acute encephalopathy Active Problems:   Essential hypertension   Hypothyroidism   UTI (urinary tract infection)   PAF (paroxysmal atrial fibrillation) (HCC)    Acute encephalopathy/delirium with anxiety attacks could be from UTI for which patient is on antibiotics.  Was given 1 dose of lorazepam.  At the time of my exam patient's mental status improved. Elevated LFTs with abdomen appears benign.  If LFTs worsen may have to hold amiodarone and statins. A-fib presently rate controlled.  On Eliquis and metoprolol.  Patient's amiodarone dose will be verified by pharmacy with whom I discussed.  And will dose according to pharmacy. Hyponatremia had been recently on tolvaptan.  Hyponatremia was considered to  be  multifactorial including fluid overload and SIADH.  Closely follow metabolic panel. Systolic CHF presently appears compensated on Lasix and Entresto.  Closely monitor. For thyroidism on Synthroid. UTI see #1. Possible chronic kidney disease stage II creatinine is mildly worse from previous.  Follow metabolic panel closely. Remain on statins.  Note that patient LFTs mildly elevated may have to hold statins if LFTs worsen. Patient is complaining of right shoulder and neck pain after coming to the hospital from med center.  We will get x-rays of the neck and right shoulder.  No restriction of movement of the right shoulder at the time of my exam.   DVT prophylaxis: Eliquis. Code Status: Full code. Family Communication: We will need to discuss with family when available for further history. Disposition Plan: Back to home when stable. Consults called: None. Admission status: Observation.   Eduard Clos MD [facility] Pager [phone].  If 7PM-7AM, please contact night-coverage [email or web address] Password [email or web address]  12/25/2021, 1:02 AM

## 2021-12-25 NOTE — Discharge Summary (Signed)
Physician Discharge Summary   Patient: [name] MRN: [id number] DOB: 1931-09-28  Admit date:     12/24/2021  Discharge date: 12/25/21  Discharge Physician: Lynden Oxford  PCP: Georgianne Fick, MD  Recommendations at discharge: Follow-up with PCP in 1 week.  Currently scheduled on 3/28. Follow-up with cardiology in 1 week.  Currently scheduled on 3/27. Recommend rechecking CMP in 1 week. Recommend rechecking LDL.  Discharge Diagnoses: Principal Problem:   Acute encephalopathy Active Problems:   Essential hypertension   Hypothyroidism   UTI (urinary tract infection)   PAF (paroxysmal atrial fibrillation) (HCC)   HLD (hyperlipidemia)   Hospital Course:  Assessment and Plan: Principal Problem: Acute encephalopathy UTI Most likely metabolic in nature. Presents with what has been described as an anxiety episode at home.  Patient felt that her the episode was more like a panic attack. What daughter has described as severe delirium. And what the husband has described as generalized shaking of her whole body with confusion. CT head unremarkable for any acute abnormality. Chest x-ray unremarkable. Serum creatinine mildly elevated.  AST and ALT mildly elevated. UA did show some pyuria. Patient did report some burning urination. Patient was started on IV ceftriaxone.   Patient was given Ativan with significant improvement in mentation. At the time of my evaluation patient does not have any focal deficit. No asterixis on exam. Patient was able to eat and drink okay. Patient was able to ambulate 100 feet without any difficulty with walker. Will discharge on oral Omnicef. Medication adjusted in response to elevated LFTs. Serum creatinine improved without any intervention.  Elevated LFT. Etiology not clear.  Patient was recently started on amiodarone. That can be contributing to her LFT elevation although it improved without any intervention. We will suggest most  likely hemodynamic injury or possible syncopal-like events at home. Currently I will continue amiodarone at discharge. Hepatitis panel is negative. We will discontinue lovastatin  Acute kidney injury. Serum creatinine on admission 1.3. Serum creatinine recently 0.9 or less than 1. At present suspect this is in response to Lasix and use of Entresto. Will reduce the dose of the Lasix from 40 mg daily to 20 mg daily with the use of as needed for 3 pound weight gain.  Hyponatremia. Appears to be close to baseline. Sodium level about the same from recent discharge. Outpatient follow-up with PCP in 1 week with repeat labs recommended. Recently admitted in the hospital, seen by nephrology, given tolvaptan.   Essential hypertension Blood pressure is stable. Continue home regimen.  Hypothyroidism Continue home regimen.  PAF (paroxysmal atrial fibrillation) (HCC) Chronic combined CHF On amiodarone. We will continue. Outpatient follow-up with cardiology recommended. Patient will be tapering amiodarone on 3/25.  Dose verified with the pharmacy. Reduce the dose of the Lasix from 40 mg with the addition of 20 mg as needed for volume overload.  HLD (hyperlipidemia) Patient's lovastatin does interact with amiodarone. I personally called office [facility]. Last lipid panel labs were done on March 18 2021 LDL was 70.  Will hold lovastatin.  Recommend follow-up with PCP and repeat lipid panel to see whether the patient needs a lower dose of lovastatin or not.  Obesity. Placing the patient at high risk of poor outcome.  Anxiety. Patient has been on intermittent Ativan at home. We will prescribe a short course of Ativan as needed. [place] Controlled Substance Reporting System database was reviewed.    Daughter had some concerns with regarding home health unable to resume until  the patient is seen by PCP.,  Verified with case management the patient will receive home  health services within 48 hours.  Consultants: none Procedures performed:  none DISCHARGE MEDICATION: Allergies as of 12/25/2021   No Known Allergies      Medication List     STOP taking these medications    lovastatin 40 MG tablet Commonly known as: MEVACOR       TAKE these medications    amiodarone 200 MG tablet Commonly known as: PACERONE Take 2 tablets (400 mg total) by mouth 2 (two) times daily for 3 days, THEN 1 tablet (200 mg total) daily. Take 2tabs twice a day for 6days then 1 tab daily. Start taking on: December 25, 2021 What changed: See the new instructions.   apixaban 5 MG Tabs tablet Commonly known as: ELIQUIS Take 1 tablet (5 mg total) by mouth 2 (two) times daily.   Calcium Carbonate-Vitamin D 500-125 MG-UNIT Tabs Take 1 tablet by mouth every evening.   cefdinir 300 MG capsule Commonly known as: OMNICEF Take 1 capsule (300 mg total) by mouth 2 (two) times daily for 5 days.   Fish Oil 1200 MG Caps Take by mouth.   furosemide 20 MG tablet Commonly known as: LASIX Take 1 tablet (20 mg total) by mouth daily. And take 20 mg tablet extra for weight gain of 3lbs in 1 day What changed:  medication strength how much to take additional instructions   levothyroxine 200 MCG tablet Commonly known as: SYNTHROID Take 1 tablet (200 mcg total) by mouth daily before breakfast.   LORazepam 0.5 MG tablet Commonly known as: ATIVAN Take 1 tablet (0.5 mg total) by mouth daily as needed for anxiety or sleep. What changed: when to take this   metoprolol succinate 50 MG 24 hr tablet Commonly known as: TOPROL-XL Take 1 tablet (50 mg total) by mouth daily. Take with or immediately following a meal. What changed: when to take this   multivitamin with minerals Tabs tablet Take 1 tablet by mouth every evening.   Ocuvite Eye Health Formula Caps Take 1 capsule by mouth every evening.   omeprazole 10 MG capsule Commonly known as: PRILOSEC Take 10 mg by mouth every  evening.   potassium chloride SA 20 MEQ tablet Commonly known as: KLOR-CON M Take 1 tablet (20 mEq total) by mouth daily.   sacubitril-valsartan 24-26 MG Commonly known as: ENTRESTO Take 1 tablet by mouth 2 (two) times daily.   vitamin B-1 250 MG tablet Take 125 mg by mouth every evening.   vitamin B-12 1000 MCG tablet Commonly known as: CYANOCOBALAMIN Take 1,000 mcg by mouth daily.   Vitamin D 50 MCG (2000 UT) Caps Take 2,000 Units by mouth daily.        Follow-up Information     Georgianne Fick, MD. Schedule an appointment as soon as possible for a visit in 1 week(s).   Specialty: Internal Medicine Contact information: [address] [place] [address] [place] [address] [phone]         [facility] Follow up.   Specialty: Home Health Services Why: Someone will contact you within 48 hours to set up a time to come out Contact information: [address] [place] [address] [phone]                Disposition: Home health Diet recommendation:  Discharge Diet Orders (From admission, onward)     Start     Ordered   12/25/21 0000  Diet - low sodium heart healthy        12/25/21 1010           Discharge Exam: Filed Weights   12/25/21 0500  Weight: 91.2 kg   General: Appear in no distress; no visible Abnormal Neck Mass Or lumps, Conjunctiva normal Cardiovascular: S1 and S2 Present, no Murmur, Respiratory: good respiratory effort, Bilateral Air entry present and CTA, no Crackles, no wheezes Abdomen: Bowel Sound present non tender Extremities: trace Pedal edema Neurology: alert and oriented to time, place, and person Gait not checked due to patient safety concerns   Condition at discharge: stable  The results of significant diagnostics from this hospitalization (including imaging, microbiology, ancillary and laboratory) are listed below for reference.   Imaging Studies: DG Chest 1 View  Result Date:  12/12/2021 CLINICAL DATA:  Shortness of breath EXAM: CHEST  1 VIEW COMPARISON:  Yesterday FINDINGS: Cardiomegaly and vascular pedicle widening with left pleural effusion and diffuse interstitial prominence. No pneumothorax. Much of the left lower lung is obscured. Remote left rib fractures. IMPRESSION: CHF with asymmetric left pleural effusion. No convincing change from yesterday. Electronically Signed   By: Tiburcio Pea M.D.   On: 12/12/2021 04:28   DG Chest 2 View  Result Date: 12/24/2021 CLINICAL DATA:  Dyspnea EXAM: CHEST - 2 VIEW COMPARISON:  12/12/2021 chest radiograph. FINDINGS: Stable cardiomediastinal silhouette with normal heart size. No pneumothorax. Small left pleural effusion and trace right pleural effusion, slightly decreased. No overt pulmonary edema. Hazy left retrocardiac opacity, similar. IMPRESSION: 1. Small left and trace right pleural effusions, slightly decreased. 2. Stable hazy left retrocardiac opacity, favor left lower lobe atelectasis. Electronically Signed   By: Delbert Phenix M.D.   On: 12/24/2021 17:18   DG Cervical Spine 2 or 3 views  Result Date: 12/25/2021 CLINICAL DATA:  Right shoulder neck pain since last night, no injury. EXAM: CERVICAL SPINE - 2-3 VIEW COMPARISON:  None. FINDINGS: The cervical spine is visualized from the occiput to the cervicothoracic junction. Minimal straightening of the normal cervical lordosis. Alignment is otherwise anatomic. Vertebral body height is maintained. Prevertebral soft tissues are within normal limits. Multilevel uncovertebral hypertrophy and facet sclerosis. Anterior marginal osteophytosis from C3-4 to C6-7. Probable loss of disc space height at C6-7. Visualized lung apices are grossly clear. IMPRESSION: 1. No acute findings. 2. Multilevel degenerative disc disease with uncovertebral and facet hypertrophy. Electronically Signed   By: Leanna Battles M.D.   On: 12/25/2021 08:54   DG Shoulder Right  Result Date: 12/25/2021 CLINICAL  DATA:  Right shoulder and neck pain since last night. No known injury. EXAM: RIGHT SHOULDER - 2+ VIEW COMPARISON:  None. FINDINGS: There is diffuse decreased bone mineralization. The glenohumeral joint is maintained. Moderate acromioclavicular joint space narrowing and peripheral osteophytosis. Moderate distal lateral subacromial spurring. No acute fracture is seen. No dislocation. The visualized portion of the right lung is unremarkable. IMPRESSION: Moderate right acromioclavicular osteoarthritis with moderate distal lateral subacromial spurring. Electronically Signed   By: Neita Garnet M.D.   On: 12/25/2021 08:56   CT Head Wo Contrast  Result Date: 12/24/2021 CLINICAL DATA:  Mental status change, unknown cause EXAM: CT HEAD WITHOUT CONTRAST TECHNIQUE: Contiguous axial images were obtained from the base of the skull through the vertex without intravenous contrast. RADIATION DOSE REDUCTION: This exam was performed according to the departmental dose-optimization program which includes automated exposure control, adjustment of the mA and/or kV according to patient size and/or use of iterative reconstruction technique. COMPARISON:  Remote head CT May of 20124 FINDINGS: Brain: Age related atrophy. No intracranial hemorrhage, mass effect, or midline shift. No hydrocephalus. The basilar cisterns are patent. Mild to moderate periventricular chronic small vessel ischemia. No evidence of territorial infarct or acute ischemia. No extra-axial or intracranial fluid collection. Vascular: Atherosclerosis of skullbase vasculature without hyperdense vessel or abnormal calcification. Skull: No fracture or focal lesion. Sinuses/Orbits: Bilateral cataract resection.  No acute findings. Other: None. IMPRESSION: 1. No acute intracranial abnormality. 2. Age related atrophy and chronic small vessel ischemia. Electronically Signed   By: Narda Rutherford M.D.   On: 12/24/2021 20:08   DG CHEST PORT 1 VIEW  Result Date:  12/11/2021 CLINICAL DATA:  Shortness of breath EXAM: PORTABLE CHEST 1 VIEW COMPARISON:  12/08/2021 FINDINGS: Remote left rib fractures. Midline trachea. Moderate cardiomegaly. Layering left pleural effusion is small and similar. No pneumothorax. Interstitial prominence persists. Left greater than right base airspace disease is not significantly changed. IMPRESSION: Given differences in technique, no significant change since 12/08/2021. Congestive heart failure with layering left pleural effusion. Persistent bibasilar airspace disease, most likely atelectasis. At the left lung base, pneumonia or aspiration cannot be excluded. Electronically Signed   By: Jeronimo Greaves M.D.   On: 12/11/2021 15:41   DG Chest Port 1 View  Result Date: 12/08/2021 CLINICAL DATA:  Shortness of breath EXAM: PORTABLE CHEST 1 VIEW COMPARISON:  None. FINDINGS: Bilateral reticulonodular interstitial thickening. Left lower lobe retrocardiac airspace disease. Small left pleural effusion. No pneumothorax. Mild cardiomegaly. No acute osseous abnormality. Multiple left posterior rib fractures. IMPRESSION: 1. Bilateral interstitial thickening which may reflect interstitial edema versus pneumonia including atypical viral etiologies. 2. Left lower lobe retrocardiac airspace disease which may reflect atelectasis versus pneumonia. Electronically Signed   By: Elige Ko M.D.   On: 12/08/2021 11:40   ECHOCARDIOGRAM COMPLETE  Result Date: 12/09/2021    ECHOCARDIOGRAM REPORT   Patient Name:   [name] [facility] Date of Exam: 12/09/2021 Medical Rec #:  [id number]         Height:       66.5 in Accession #:    [id number]        Weight:       207.9 lb Date of Birth:  May 10, 1931        BSA:          2.044 m Patient Age:    88 years          BP:           139/93 mmHg Patient Gender: F                 HR:           93 bpm. Exam Location:  Inpatient Procedure: 2D Echo, Cardiac Doppler and Color Doppler Indications:    Atrial Fibrillation I48.91  History:         Patient has prior history of Echocardiogram examinations, most                 recent 03/04/2016. Arrythmias:RBBB, Signs/Symptoms:Dyspnea; Risk                 Factors:Hypertension.  Sonographer:    Eulah Pont RDCS Referring Phys: [id number] RONDELL A SMITH IMPRESSIONS  1. Left ventricular ejection fraction, by estimation, is 45%. The left ventricle has mildly decreased function. The left ventricle demonstrates global hypokinesis. Left ventricular diastolic parameters are indeterminate.  2. Right ventricular systolic function is mildly reduced. The right ventricular size is normal. There is  mildly elevated pulmonary artery systolic pressure. The estimated right ventricular systolic pressure is 36.5 mmHg.  3. Left atrial size was mildly dilated.  4. The mitral valve is abnormal. Moderate mitral valve regurgitation. No evidence of mitral stenosis.  5. The aortic valve is tricuspid. There is moderate calcification of the aortic valve. Aortic valve regurgitation is mild. Aortic valve sclerosis/calcification is present, without any evidence of aortic stenosis.  6. Aortic dilatation noted. There is mild dilatation of the ascending aorta, measuring 40 mm.  7. The inferior vena cava is dilated in size with <50% respiratory variability, suggesting right atrial pressure of 15 mmHg.  8. There was a left pleural effusion.  9. The patient was in atrial fibrillation. FINDINGS  Left Ventricle: Left ventricular ejection fraction, by estimation, is 45%. The left ventricle has mildly decreased function. The left ventricle demonstrates global hypokinesis. The left ventricular internal cavity size was normal in size. There is no left ventricular hypertrophy. Left ventricular diastolic parameters are indeterminate. Right Ventricle: The right ventricular size is normal. No increase in right ventricular wall thickness. Right ventricular systolic function is mildly reduced. There is mildly elevated pulmonary artery systolic pressure. The  tricuspid regurgitant velocity  is 2.32 m/s, and with an assumed right atrial pressure of 15 mmHg, the estimated right ventricular systolic pressure is 36.5 mmHg. Left Atrium: Left atrial size was mildly dilated. Right Atrium: Right atrial size was normal in size. Pericardium: There was a left pleural effusion. There is no evidence of pericardial effusion. Mitral Valve: The mitral valve is abnormal. There is mild calcification of the mitral valve leaflet(s). Moderate mitral valve regurgitation. No evidence of mitral valve stenosis. Tricuspid Valve: The tricuspid valve is normal in structure. Tricuspid valve regurgitation is mild. Aortic Valve: The aortic valve is tricuspid. There is moderate calcification of the aortic valve. Aortic valve regurgitation is mild. Aortic regurgitation PHT measures 523 msec. Aortic valve sclerosis/calcification is present, without any evidence of aortic stenosis. Pulmonic Valve: The pulmonic valve was normal in structure. Pulmonic valve regurgitation is not visualized. Aorta: The aortic root is normal in size and structure and aortic dilatation noted. There is mild dilatation of the ascending aorta, measuring 40 mm. Venous: The inferior vena cava is dilated in size with less than 50% respiratory variability, suggesting right atrial pressure of 15 mmHg. IAS/Shunts: No atrial level shunt detected by color flow Doppler.  LEFT VENTRICLE PLAX 2D LVIDd:         4.80 cm LVIDs:         3.60 cm LV PW:         1.10 cm LV IVS:        1.00 cm LVOT diam:     2.20 cm LV SV:         48 LV SV Index:   23 LVOT Area:     3.80 cm  LV Volumes (MOD) LV vol d, MOD A2C: 98.7 ml LV vol d, MOD A4C: 78.6 ml LV vol s, MOD A2C: 53.7 ml LV vol s, MOD A4C: 47.2 ml LV SV MOD A2C:     45.0 ml LV SV MOD A4C:     78.6 ml LV SV MOD BP:      39.7 ml RIGHT VENTRICLE TAPSE (M-mode): 1.7 cm LEFT ATRIUM             Index        RIGHT ATRIUM           Index LA diam:  3.90 cm 1.91 cm/m   RA Area:     16.30 cm LA Vol  (A2C):   65.6 ml 32.09 ml/m  RA Volume:   37.70 ml  18.44 ml/m LA Vol (A4C):   63.4 ml 31.01 ml/m LA Biplane Vol: 67.5 ml 33.02 ml/m  AORTIC VALVE LVOT Vmax:         81.73 cm/s LVOT Vmean:        53.333 cm/s LVOT VTI:          0.126 m AI PHT:            523 msec AR Vena Contracta: 0.30 cm  AORTA Ao Root diam: 3.50 cm Ao Asc diam:  4.00 cm MR Peak grad:    93.3 mmHg    TRICUSPID VALVE MR Mean grad:    57.0 mmHg    TR Peak grad:   21.5 mmHg MR Vmax:         483.00 cm/s  TR Vmax:        232.00 cm/s MR Vmean:        354.0 cm/s MR PISA:         1.01 cm     SHUNTS MR PISA Eff ROA: 8 mm        Systemic VTI:  0.13 m MR PISA Radius:  0.40 cm      Systemic Diam: 2.20 cm Dalton McleanMD Electronically signed by Wilfred Lacy Signature Date/Time: 12/09/2021/7:57:33 PM    Final    ECHO TEE  Result Date: 12/11/2021    TRANSESOPHOGEAL ECHO REPORT   Patient Name:   [name] [facility] Date of Exam: 12/11/2021 Medical Rec #:  [id number]         Height:       66.5 in Accession #:    [id number]        Weight:       207.7 lb Date of Birth:  1931/07/18        BSA:          2.043 m Patient Age:    88 years          BP:           99/79 mmHg Patient Gender: F                 HR:           90 bpm. Exam Location:  Inpatient Procedure: 3D Echo, Transesophageal Echo, Cardiac Doppler and Color Doppler Indications:     I48.91* Unspeicified atrial fibrillation  History:         Patient has prior history of Echocardiogram examinations, most                  recent 12/09/2021. CHF, Abnormal ECG, Arrythmias:Atrial                  Fibrillation, Signs/Symptoms:Shortness of Breath and Dyspnea;                  Risk Factors:Hypertension.  Sonographer:     Sheralyn Boatman RDCS Referring Phys:  [id number] [facility] A Izora Ribas Diagnosing Phys: Riley Lam MD PROCEDURE: After discussion of the risks and benefits of a TEE, an informed consent was obtained from the patient. The transesophogeal probe was passed without difficulty through the esophogus of  the patient. Imaged were obtained with the patient in a left lateral decubitus position. Sedation performed by different physician. The patient was monitored while under deep sedation. Anesthestetic sedation was provided intravenously  by Anesthesiology: 265mg  of Propofol. The patient developed no complications during the procedure. A successful direct current cardioversion was performed at 200 joules with 2 attempts. IMPRESSIONS  1. Left ventricular ejection fraction, by estimation, is 45 to 50%. Left ventricular ejection fraction by 3D volume is 46 %. The left ventricle has mildly decreased function. The left ventricle demonstrates global hypokinesis. Left ventricular diastolic  function could not be evaluated.  2. Right ventricular systolic function is normal. The right ventricular size is normal.  3. Left atrial size was mildly dilated. No left atrial/left atrial appendage thrombus was detected.  4. Large pleural effusion in the left lateral region.  5. The mitral valve is grossly normal. Moderate mitral valve regurgitation. No evidence of mitral stenosis.  6. Tricuspid valve regurgitation is mild to moderate.  7. The aortic valve is tricuspid. Aortic valve regurgitation is mild to moderate. No aortic stenosis is present. Aortic valve mean gradient measures 3.0 mmHg.  8. There is mild (Grade II) plaque involving the descending aorta. Conclusion(s)/Recommendation(s): No LAA thrombus. Successful Cardioversion. FINDINGS  Left Ventricle: Left ventricular ejection fraction, by estimation, is 45 to 50%. Left ventricular ejection fraction by 3D volume is 46 %. The left ventricle has mildly decreased function. The left ventricle demonstrates global hypokinesis. The left ventricular internal cavity size was normal in size. Left ventricular diastolic function could not be evaluated. Right Ventricle: The right ventricular size is normal. Right vetricular wall thickness was not assessed. Right ventricular systolic function  is normal. Left Atrium: Left atrial size was mildly dilated. No left atrial/left atrial appendage thrombus was detected. Right Atrium: Right atrial size was normal in size. Pericardium: Trivial pericardial effusion is present. Mitral Valve: The mitral valve is grossly normal. Moderate mitral valve regurgitation. No evidence of mitral valve stenosis. Tricuspid Valve: The tricuspid valve is normal in structure. Tricuspid valve regurgitation is mild to moderate. No evidence of tricuspid stenosis. Aortic Valve: The aortic valve is tricuspid. Aortic valve regurgitation is mild to moderate. Aortic regurgitation PHT measures 723 msec. No aortic stenosis is present. Aortic valve mean gradient measures 3.0 mmHg. Aortic valve peak gradient measures 7.0 mmHg. Aortic valve area, by VTI measures 2.53 cm. Pulmonic Valve: The pulmonic valve was normal in structure. Pulmonic valve regurgitation is not visualized. Aorta: The aortic root and ascending aorta are structurally normal, with no evidence of dilitation. There is mild (Grade II) plaque involving the descending aorta. IAS/Shunts: No atrial level shunt detected by color flow Doppler. Additional Comments: There is a large pleural effusion in the left lateral region.  LEFT VENTRICLE PLAX 2D LVOT diam:     2.20 cm LV SV:         59 LV SV Index:   29              3D Volume EF LVOT Area:     3.80 cm        LV 3D EF:    Left                                             ventricul                                             ar  ejection                                             fraction                                             by 3D                                             volume is                                             46 %.                                 3D Volume EF                                LV 3D EF:    46.40 %                                LV 3D EDV:   85100.00                                             mm                                 LV 3D ESV:   45600.00                                             mm                                LV 3D SV:    39500.00                                             mm                                 3D Volume EF:                                3D EF:        46 % AORTIC VALVE AV Area (Vmax):    3.20 cm AV Area (Vmean):   3.35 cm  AV Area (VTI):     2.53 cm AV Vmax:           132.00 cm/s AV Vmean:          87.900 cm/s AV VTI:            0.234 m AV Peak Grad:      7.0 mmHg AV Mean Grad:      3.0 mmHg LVOT Vmax:         111.00 cm/s LVOT Vmean:        77.400 cm/s LVOT VTI:          0.156 m LVOT/AV VTI ratio: 0.67 AI PHT:            723 msec MR Peak grad:    72.6 mmHg MR Mean grad:    46.0 mmHg    SHUNTS MR Vmax:         426.00 cm/s  Systemic VTI:  0.16 m MR Vmean:        314.0 cm/s   Systemic Diam: 2.20 cm MR PISA:         1.57 cm MR PISA Eff ROA: 14 mm MR PISA Radius:  0.50 cm Riley Lam MD Electronically signed by Riley Lam MD Signature Date/Time: 12/11/2021/5:00:37 PM    Final     Microbiology: Results for orders placed or performed during the hospital encounter of 12/24/21  Resp Panel by RT-PCR (Flu A&B, Covid) Nasopharyngeal Swab     Status: None   Collection Time: 12/24/21  7:23 PM   Specimen: Nasopharyngeal Swab; Nasopharyngeal(NP) swabs in vial transport medium  Result Value Ref Range Status   SARS Coronavirus 2 by RT PCR NEGATIVE NEGATIVE Final    Comment: (NOTE) SARS-CoV-2 target nucleic acids are NOT DETECTED.  The SARS-CoV-2 RNA is generally detectable in upper respiratory specimens during the acute phase of infection. The lowest concentration of SARS-CoV-2 viral copies this assay can detect is 138 copies/mL. A negative result does not preclude SARS-Cov-2 infection and should not be used as the sole basis for treatment or other patient management decisions. A negative result may occur with  improper specimen collection/handling, submission of  specimen other than nasopharyngeal swab, presence of viral mutation(s) within the areas targeted by this assay, and inadequate number of viral copies(<138 copies/mL). A negative result must be combined with clinical observations, patient history, and epidemiological information. The expected result is Negative.  Fact Sheet for Patients:  BloggerCourse.com  Fact Sheet for Healthcare Providers:  SeriousBroker.it  This test is no t yet approved or cleared by the [place] FDA and  has been authorized for detection and/or diagnosis of SARS-CoV-2 by FDA under an Emergency Use Authorization (EUA). This EUA will remain  in effect (meaning this test can be used) for the duration of the COVID-19 declaration under Section 564(b)(1) of the Act, 21 U.S.C.section 360bbb-3(b)(1), unless the authorization is terminated  or revoked sooner.       Influenza A by PCR NEGATIVE NEGATIVE Final   Influenza B by PCR NEGATIVE NEGATIVE Final    Comment: (NOTE) The Xpert Xpress SARS-CoV-2/FLU/RSV plus assay is intended as an aid in the diagnosis of influenza from Nasopharyngeal swab specimens and should not be used as a sole basis for treatment. Nasal washings and aspirates are unacceptable for Xpert Xpress SARS-CoV-2/FLU/RSV testing.  Fact Sheet for Patients: BloggerCourse.com  Fact Sheet for Healthcare Providers: SeriousBroker.it  This test is not yet approved or cleared by the [place] and has been authorized  for detection and/or diagnosis of SARS-CoV-2 by FDA under an Emergency Use Authorization (EUA). This EUA will remain in effect (meaning this test can be used) for the duration of the COVID-19 declaration under Section 564(b)(1) of the Act, 21 U.S.C. section 360bbb-3(b)(1), unless the authorization is terminated or revoked.  Performed at [facility], [address], [place], [place] [address]     Labs: CBC: Recent Labs  Lab 12/19/21 0130 12/24/21 1739 12/25/21 0500  WBC 11.3* 7.0 9.6  NEUTROABS  --  4.7  --   HGB 13.1 12.9 11.6*  HCT 38.9 39.0 35.9*  MCV 81.2 81.1 83.7  PLT 333 326 313   Basic Metabolic Panel: Recent Labs  Lab 12/19/21 0130 12/19/21 1903 12/20/21 0107 12/20/21 1839 12/21/21 0203 12/22/21 0049 12/24/21 1739 12/25/21 0500  NA 121*   < > 126* 128* 127* 128* 130* 128*  K 4.1  --  4.2  --  4.3 4.3 4.2 4.4  CL 84*  --  89*  --  91* 93* 95* 97*  CO2 26  --  29  --  27 27 22  21*  GLUCOSE 103*  --  117*  --  116* 117* 102* 104*  BUN 22  --  18  --  21 22 24* 22  CREATININE 0.89  --  1.05*  --  0.99 1.17* 1.30* 1.04*  CALCIUM 8.8*  --  8.9  --  9.0 8.7* 9.7 8.8*  PHOS 3.5  --  4.2  --  3.9 4.3  --   --    < > = values in this interval not displayed.   Liver Function Tests: Recent Labs  Lab 12/20/21 0107 12/21/21 0203 12/22/21 0049 12/24/21 1739 12/25/21 0500  AST  --   --   --  103* 70*  ALT  --   --   --  126* 96*  ALKPHOS  --   --   --  89 80  BILITOT  --   --   --  0.5 0.3  PROT  --   --   --  7.4 6.3*  ALBUMIN 2.8* 2.8* 2.7* 4.0 3.2*   CBG: No results for input(s): GLUCAP in the last 168 hours.  Discharge time spent: greater than 30 minutes.  Signed: Lynden Oxford, MD [facility] 12/25/2021

## 2021-12-25 NOTE — Progress Notes (Signed)
Patient ambulated over 118f in the hallway with front wheel walker with this RN. Patient able to maintain good posture and navigate ambulation with no problems once using the walker. ?

## 2021-12-27 ENCOUNTER — Other Ambulatory Visit: Payer: Self-pay | Admitting: Internal Medicine

## 2021-12-27 DIAGNOSIS — E876 Hypokalemia: Secondary | ICD-10-CM | POA: Diagnosis not present

## 2021-12-27 DIAGNOSIS — I509 Heart failure, unspecified: Secondary | ICD-10-CM | POA: Diagnosis not present

## 2021-12-27 DIAGNOSIS — E785 Hyperlipidemia, unspecified: Secondary | ICD-10-CM | POA: Diagnosis not present

## 2021-12-27 DIAGNOSIS — R350 Frequency of micturition: Secondary | ICD-10-CM | POA: Diagnosis not present

## 2021-12-27 DIAGNOSIS — M1990 Unspecified osteoarthritis, unspecified site: Secondary | ICD-10-CM | POA: Diagnosis not present

## 2021-12-27 DIAGNOSIS — I110 Hypertensive heart disease with heart failure: Secondary | ICD-10-CM | POA: Diagnosis not present

## 2021-12-27 DIAGNOSIS — I080 Rheumatic disorders of both mitral and aortic valves: Secondary | ICD-10-CM | POA: Diagnosis not present

## 2021-12-27 DIAGNOSIS — I5043 Acute on chronic combined systolic (congestive) and diastolic (congestive) heart failure: Secondary | ICD-10-CM | POA: Diagnosis not present

## 2021-12-27 DIAGNOSIS — I4510 Unspecified right bundle-branch block: Secondary | ICD-10-CM | POA: Diagnosis not present

## 2021-12-27 DIAGNOSIS — L89322 Pressure ulcer of left buttock, stage 2: Secondary | ICD-10-CM | POA: Diagnosis not present

## 2021-12-27 DIAGNOSIS — E871 Hypo-osmolality and hyponatremia: Secondary | ICD-10-CM | POA: Diagnosis not present

## 2021-12-27 DIAGNOSIS — I4891 Unspecified atrial fibrillation: Secondary | ICD-10-CM | POA: Diagnosis not present

## 2021-12-27 DIAGNOSIS — K219 Gastro-esophageal reflux disease without esophagitis: Secondary | ICD-10-CM | POA: Diagnosis not present

## 2021-12-27 DIAGNOSIS — R32 Unspecified urinary incontinence: Secondary | ICD-10-CM | POA: Diagnosis not present

## 2021-12-27 DIAGNOSIS — R3915 Urgency of urination: Secondary | ICD-10-CM | POA: Diagnosis not present

## 2021-12-27 DIAGNOSIS — E669 Obesity, unspecified: Secondary | ICD-10-CM | POA: Diagnosis not present

## 2021-12-27 DIAGNOSIS — J189 Pneumonia, unspecified organism: Secondary | ICD-10-CM | POA: Diagnosis not present

## 2021-12-27 DIAGNOSIS — Z7901 Long term (current) use of anticoagulants: Secondary | ICD-10-CM | POA: Diagnosis not present

## 2021-12-27 DIAGNOSIS — D649 Anemia, unspecified: Secondary | ICD-10-CM | POA: Diagnosis not present

## 2021-12-27 DIAGNOSIS — E039 Hypothyroidism, unspecified: Secondary | ICD-10-CM | POA: Diagnosis not present

## 2021-12-27 DIAGNOSIS — Z6832 Body mass index (BMI) 32.0-32.9, adult: Secondary | ICD-10-CM | POA: Diagnosis not present

## 2021-12-27 LAB — URINE CULTURE: Culture: >=100000 — AB

## 2021-12-27 MED ORDER — CIPROFLOXACIN HCL 500 MG PO TABS: 500.0000 mg | ORAL_TABLET | Freq: Two times a day (BID) | ORAL | 0 refills | Status: DC

## 2021-12-27 NOTE — Progress Notes (Addendum)
[facility] ?TELEPHONE ENCOUNTER NOTE ? ?Patient: [name] JYL:[id number]   ?PCP: Merrilee Seashore, MD DOB: [birth year]   ?DOS: 12/27/2021    ? ?Urine culture grew pseudomonas pansensitive. Will change therapy from omnicef to cipro for a total of 3 days. ?Addendum: ?Daughter reported that she has been cutting the 40 mg lasix in half and using that, and pt has some increase cough today. I recommended to use the extra tablet of lasix to help with symptoms. ?Personally called pharmacy for a ensuring that they have received the order.  ? ?Author: ? ?Berle Mull, MD ?[facility] ?12/27/2021  ?If 7PM-7AM, please contact night-coverage ?To reach On-call, see [email or web address]   ?

## 2021-12-27 NOTE — Progress Notes (Addendum)
Received message from [name] [phone] ref#[id number] concerning a complaint about [place] orders. ?VM left with [facility] - chart reviewed, [place] orders placed on 12/25/21 for HHRN/PT/OT/aide. Also talked to Romie Jumper with [facility] agency. He stated that it was miscommunication and a home health nurse will see the patient today and home health PT/OT will see the patient on Monday 12/30/21. ? ?12/27/21- Received call back from [facility] with [facility]. All questions answered, chart reviewed. Case will be closed by [facility]. ? ?B Dominic Mahaney RN,MHA,BSN ?Transition of Care Supervisor ?[phone] ?

## 2021-12-30 ENCOUNTER — Telehealth (HOSPITAL_COMMUNITY): Payer: Self-pay | Admitting: *Deleted

## 2021-12-30 ENCOUNTER — Telehealth (HOSPITAL_COMMUNITY): Payer: Self-pay

## 2021-12-30 ENCOUNTER — Ambulatory Visit (HOSPITAL_COMMUNITY)
Admit: 2021-12-30 | Discharge: 2021-12-30 | Disposition: A | Discharge status: Home / Self Care | Payer: Medicare HMO | Attending: Cardiology | Admitting: Cardiology

## 2021-12-30 ENCOUNTER — Other Ambulatory Visit: Payer: Self-pay

## 2021-12-30 VITALS — BP 130/72 | HR 69 | Wt 195.0 lb

## 2021-12-30 DIAGNOSIS — E785 Hyperlipidemia, unspecified: Secondary | ICD-10-CM | POA: Insufficient documentation

## 2021-12-30 DIAGNOSIS — E1122 Type 2 diabetes mellitus with diabetic chronic kidney disease: Secondary | ICD-10-CM | POA: Insufficient documentation

## 2021-12-30 DIAGNOSIS — E7800 Pure hypercholesterolemia, unspecified: Secondary | ICD-10-CM | POA: Diagnosis not present

## 2021-12-30 DIAGNOSIS — N183 Chronic kidney disease, stage 3 unspecified: Secondary | ICD-10-CM | POA: Insufficient documentation

## 2021-12-30 DIAGNOSIS — Z79899 Other long term (current) drug therapy: Secondary | ICD-10-CM | POA: Diagnosis not present

## 2021-12-30 DIAGNOSIS — I5022 Chronic systolic (congestive) heart failure: Secondary | ICD-10-CM | POA: Diagnosis not present

## 2021-12-30 DIAGNOSIS — I340 Nonrheumatic mitral (valve) insufficiency: Secondary | ICD-10-CM | POA: Diagnosis not present

## 2021-12-30 DIAGNOSIS — Z7901 Long term (current) use of anticoagulants: Secondary | ICD-10-CM | POA: Diagnosis not present

## 2021-12-30 DIAGNOSIS — L89152 Pressure ulcer of sacral region, stage 2: Secondary | ICD-10-CM | POA: Diagnosis not present

## 2021-12-30 DIAGNOSIS — E039 Hypothyroidism, unspecified: Secondary | ICD-10-CM | POA: Diagnosis not present

## 2021-12-30 DIAGNOSIS — I10 Essential (primary) hypertension: Secondary | ICD-10-CM | POA: Diagnosis not present

## 2021-12-30 DIAGNOSIS — I4819 Other persistent atrial fibrillation: Secondary | ICD-10-CM | POA: Diagnosis not present

## 2021-12-30 DIAGNOSIS — G9340 Encephalopathy, unspecified: Secondary | ICD-10-CM | POA: Diagnosis not present

## 2021-12-30 DIAGNOSIS — R051 Acute cough: Secondary | ICD-10-CM | POA: Diagnosis not present

## 2021-12-30 DIAGNOSIS — R69 Illness, unspecified: Secondary | ICD-10-CM | POA: Diagnosis not present

## 2021-12-30 DIAGNOSIS — N390 Urinary tract infection, site not specified: Secondary | ICD-10-CM | POA: Diagnosis not present

## 2021-12-30 DIAGNOSIS — I130 Hypertensive heart and chronic kidney disease with heart failure and stage 1 through stage 4 chronic kidney disease, or unspecified chronic kidney disease: Secondary | ICD-10-CM | POA: Diagnosis not present

## 2021-12-30 DIAGNOSIS — Z09 Encounter for follow-up examination after completed treatment for conditions other than malignant neoplasm: Secondary | ICD-10-CM | POA: Diagnosis not present

## 2021-12-30 MED ORDER — AMIODARONE HCL 200 MG PO TABS: 200.0000 mg | ORAL_TABLET | Freq: Every day | ORAL | 1 refills | Status: DC

## 2021-12-30 NOTE — Telephone Encounter (Signed)
Heart Failure Nurse Navigator Progress Note  ? ?Confirmed appointment with [name] ( patient daughter) she will be here for 3/27 @ 10 am.  ? ?Earnestine Leys, BSN, RN ?Heart Failure Nurse Navigator ?[phone]  ?

## 2021-12-30 NOTE — Patient Instructions (Signed)
Thank you for your visit today. ? ?There has not been any changes to your medications. ? ?Please ask your General Practitioner to do a complete blood count (CBC) and a Basic Metabolic panel (BMET) blood work when you next see them. ? ?Thank you for allowing [place] to provider your heart failure care after your recent hospitalization. Please follow-up with Dr. [name] at [facility]. Her office should call you to arrange your appointment.  ? ?If you have any questions, issues, or concerns before your next appointment please call our office at [phone], opt. 2 and leave a message for the triage nurse. ? ?  ?

## 2021-12-30 NOTE — Progress Notes (Signed)
? ? ?HEART & VASCULAR TRANSITION OF CARE CONSULT NOTE  ? ? ? ?Referring Physician: Dr. Stanford Breed  ?Primary Care: Merrilee Seashore, MD ?Primary Cardiologist: Dr. Oval Linsey  ? ? ?HPI: ?Referred to clinic by Dr. Stanford Breed for heart failure consultation.  ? ?88 y/o female w/ history of HTN, HLD and hypothyroidism, recently admitted w/ dyspnea and diagnosed w/ new atrial fibrillation and systolic heart failure. Echo showed mildly reduced LVEF, 45%, (previously 50-55% on echo in 2017), RV mildly reduced, mildly elevated RVSP 37 mmHg, mod MR. Denied ischemic symptoms. HS trop 10>>5. She was diuresed w/ IV Lasix and placed on metoprolol for rate control + Eliquis for a/c. Underwent TEE/DCCV back to NS[facility] but reverted back to afib. Placed on PO amiodarone w/ plans to attempt repeat DCCV in 4-6 wks thereafter.  Discharged home on PO Lasix. D/c wt 199 lb. Referred to [facility] clinic.  ? ?Presents today for post hospital assessment. Here w/ daughter. In [place]. Wt stable at 195 lb. Breathing much improved. Denies resting dyspnea. NYHA class II-III, confounded by age and deconditioning. Denies CP.  ? ?EKG shows persistent atrial fibrillation, well rate controlled, 70  bpm. Reports full compliance w/ Eliquis. Denies abnormal bleeding.  ? ?BP well controlled, 130/70.  ? ?She reports that post discharge she went to an urgent care for dysuria and confusion and diagnosed w/ a UTI. UCx grew pseudomonas aeruginosa. Placed abx and just took last dose this morning. Has f/u w/ her PCP this afternoon. Of note, she is not on an SGLT2i. Symptoms improved. Back to baseline.  ? ?She also has wet cough and nasal congestion. Husband and daughter w/ same symptoms. Going to see PCP today and plans to get tested for COVID.  ? ?She is also being followed by home health. Has home health aid and PT.  ? ? ?Cardiac Testing  ?Left ventricular ejection fraction, by estimation, is 45%. The left ventricle has mildly ?decreased function. The left ventricle  demonstrates global hypokinesis. Left ?ventricular diastolic parameters are indeterminate. ?1. ?Right ventricular systolic function is mildly reduced. The right ventricular size is ?normal. There is mildly elevated pulmonary artery systolic pressure. The estimated ?right ventricular systolic pressure is 46.6 mmHg. ?2. ?3. Left atrial size was mildly dilated. ?The mitral valve is abnormal. Moderate mitral valve regurgitation. No evidence of ?mitral stenosis. ?4. ?The aortic valve is tricuspid. There is moderate calcification of the aortic valve. Aortic ?valve regurgitation is mild. Aortic valve sclerosis/calcification is present, without any ?evidence of aortic stenosis. ?5. ?Aortic dilatation noted. There is mild dilatation of the ascending aorta, measuring 40 ?mm. ?6. ?The inferior vena cava is dilated in size with <50% respiratory variability, suggesting ?right atrial pressure of 15 mmHg. ?7. ?8. There was a left pleural effusion. ?9. The patient was in atrial fibrillation. ? ?Review of Systems: [y] = yes, '[ ]'$  = no  ? ?General: Weight gain '[ ]'$ ; Weight loss '[ ]'$ ; Anorexia '[ ]'$ ; Fatigue '[ ]'$ ; Fever '[ ]'$ ; Chills '[ ]'$ ; Weakness '[ ]'$   ?Cardiac: Chest pain/pressure '[ ]'$ ; Resting SOB '[ ]'$ ; Exertional SOB '[ ]'$ ; Orthopnea '[ ]'$ ; Pedal Edema '[ ]'$ ; Palpitations '[ ]'$ ; Syncope '[ ]'$ ; Presyncope '[ ]'$ ; Paroxysmal nocturnal dyspnea'[ ]'$   ?Pulmonary: Cough Jazmín.Cullens ]; Wheezing'[ ]'$ ; Hemoptysis'[ ]'$ ; Sputum '[ ]'$ ; Snoring '[ ]'$   ?GI: Vomiting'[ ]'$ ; Dysphagia'[ ]'$ ; Melena'[ ]'$ ; Hematochezia '[ ]'$ ; Heartburn'[ ]'$ ; Abdominal pain '[ ]'$ ; Constipation '[ ]'$ ; Diarrhea '[ ]'$ ; BRBPR '[ ]'$   ?GU: Hematuria'[ ]'$ ; Dysuria '[ ]'$ ; Nocturia'[ ]'$   ?  Vascular: Pain in legs with walking '[ ]'$ ; Pain in feet with lying flat '[ ]'$ ; Non-healing sores '[ ]'$ ; Stroke '[ ]'$ ; TIA '[ ]'$ ; Slurred speech '[ ]'$ ;  ?Neuro: Headaches'[ ]'$ ; Vertigo'[ ]'$ ; Seizures'[ ]'$ ; Paresthesias'[ ]'$ ;Blurred vision '[ ]'$ ; Diplopia '[ ]'$ ; Vision changes '[ ]'$   ?Ortho/Skin: Arthritis '[ ]'$ ; Joint pain '[ ]'$ ; Muscle pain '[ ]'$ ; Joint swelling '[ ]'$ ; Back Pain '[ ]'$ ; Rash [  ]  ?Psych: Depression'[ ]'$ ; Anxiety'[ ]'$   ?Heme: Bleeding problems '[ ]'$ ; Clotting disorders '[ ]'$ ; Anemia '[ ]'$   ?Endocrine: Diabetes '[ ]'$ ; Thyroid dysfunction[ Y] ? ? ?Past Medical History:  ?Diagnosis Date  ? Dyspnea   ? diastolic dysfunction by echo 2012  ? GERD (gastroesophageal reflux disease)   ? Hypertension   ? Mild mitral and aortic regurgitation   ? Osteoarthritis   ? Right bundle branch block 2014  ? Thyroid disease   ? ? ?Current Outpatient Medications  ?Medication Sig Dispense Refill  ? amiodarone (PACERONE) 200 MG tablet Take 2 tablets (400 mg total) by mouth 2 (two) times daily for 3 days, THEN 1 tablet (200 mg total) daily. Take 2tabs twice a day for 6days then 1 tab daily. 50 tablet 1  ? apixaban (ELIQUIS) 5 MG TABS tablet Take 1 tablet (5 mg total) by mouth 2 (two) times daily. 60 tablet 0  ? furosemide (LASIX) 20 MG tablet Take 1 tablet (20 mg total) by mouth daily. And take 20 mg tablet extra for weight gain of 3lbs in 1 day 30 tablet 0  ? levothyroxine (SYNTHROID) 200 MCG tablet Take 1 tablet (200 mcg total) by mouth daily before breakfast. 30 tablet 0  ? LORazepam (ATIVAN) 0.5 MG tablet Take 1 tablet (0.5 mg total) by mouth daily as needed for anxiety or sleep. 5 tablet 0  ? metoprolol succinate (TOPROL-XL) 50 MG 24 hr tablet Take 1 tablet (50 mg total) by mouth daily. Take with or immediately following a meal. 30 tablet 0  ? omeprazole (PRILOSEC) 10 MG capsule Take 10 mg by mouth every evening.    ? potassium chloride SA (KLOR-CON M) 20 MEQ tablet Take 1 tablet (20 mEq total) by mouth daily. 30 tablet 0  ? sacubitril-valsartan (ENTRESTO) 24-26 MG Take 1 tablet by mouth 2 (two) times daily. 60 tablet 0  ? Calcium Carbonate-Vitamin D 500-125 MG-UNIT TABS Take 1 tablet by mouth every evening. (Patient not taking: Reported on 12/25/2021)    ? Cholecalciferol (VITAMIN D) 50 MCG (2000 UT) CAPS Take 2,000 Units by mouth daily. (Patient not taking: Reported on 12/25/2021)    ? Multiple Vitamin (MULTIVITAMIN WITH  MINERALS) TABS tablet Take 1 tablet by mouth every evening. (Patient not taking: Reported on 12/25/2021)    ? Multiple Vitamins-Minerals (OCUVITE EYE HEALTH FORMULA) CAPS Take 1 capsule by mouth every evening. (Patient not taking: Reported on 12/25/2021)    ? Omega-3 Fatty Acids (FISH OIL) 1200 MG CAPS Take by mouth. (Patient not taking: Reported on 12/25/2021)    ? Thiamine HCl (VITAMIN B-1) 250 MG tablet Take 125 mg by mouth every evening. (Patient not taking: Reported on 12/25/2021)    ? vitamin B-12 (CYANOCOBALAMIN) 1000 MCG tablet Take 1,000 mcg by mouth daily. (Patient not taking: Reported on 12/25/2021)    ? ?No current facility-administered medications for this encounter.  ? ? ?No Known Allergies ? ?  ?Social History  ? ?Socioeconomic History  ? Marital status: Married  ?  Spouse name: Marshelle Bilger  ? Number of  children: 1  ? Years of education: Not on file  ? Highest education level: High school graduate  ?Occupational History  ? Occupation: retired  ?Tobacco Use  ? Smoking status: Never  ? Smokeless tobacco: Never  ?Vaping Use  ? Vaping Use: Never used  ?Substance and Sexual Activity  ? Alcohol use: No  ? Drug use: No  ? Sexual activity: Not on file  ?Other Topics Concern  ? Not on file  ?Social History Narrative  ? Not on file  ? ?Social Determinants of Health  ? ?Financial Resource Strain: Low Risk   ? Difficulty of Paying Living Expenses: Not hard at all  ?Food Insecurity: No Food Insecurity  ? Worried About Charity fundraiser in the Last Year: Never true  ? Ran Out of Food in the Last Year: Never true  ?Transportation Needs: No Transportation Needs  ? Lack of Transportation (Medical): No  ? Lack of Transportation (Non-Medical): No  ?Physical Activity: Not on file  ?Stress: Not on file  ?Social Connections: Not on file  ?Intimate Partner Violence: Not on file  ? ? ?  ?Family History  ?Problem Relation Age of Onset  ? Heart attack Mother   ? ? ?Vitals:  ? 12/30/21 1002  ?BP: 130/72  ?Pulse: 69  ?SpO2: 98%   ?Weight: 88.5 kg (195 lb)  ? ? ?PHYSICAL EXAM: ?General:  elderly WM in [place], well appearing. No respiratory difficulty ?HEENT: normal ?Neck: supple. no JVD. Carotids 2+ bilat; no bruits. No lymphadenopathy or thry

## 2021-12-30 NOTE — Telephone Encounter (Signed)
error 

## 2021-12-31 DIAGNOSIS — R3915 Urgency of urination: Secondary | ICD-10-CM | POA: Diagnosis not present

## 2021-12-31 DIAGNOSIS — R32 Unspecified urinary incontinence: Secondary | ICD-10-CM | POA: Diagnosis not present

## 2021-12-31 DIAGNOSIS — M1990 Unspecified osteoarthritis, unspecified site: Secondary | ICD-10-CM | POA: Diagnosis not present

## 2021-12-31 DIAGNOSIS — E039 Hypothyroidism, unspecified: Secondary | ICD-10-CM | POA: Diagnosis not present

## 2021-12-31 DIAGNOSIS — I4510 Unspecified right bundle-branch block: Secondary | ICD-10-CM | POA: Diagnosis not present

## 2021-12-31 DIAGNOSIS — I4891 Unspecified atrial fibrillation: Secondary | ICD-10-CM | POA: Diagnosis not present

## 2021-12-31 DIAGNOSIS — I5043 Acute on chronic combined systolic (congestive) and diastolic (congestive) heart failure: Secondary | ICD-10-CM | POA: Diagnosis not present

## 2021-12-31 DIAGNOSIS — R350 Frequency of micturition: Secondary | ICD-10-CM | POA: Diagnosis not present

## 2021-12-31 DIAGNOSIS — K219 Gastro-esophageal reflux disease without esophagitis: Secondary | ICD-10-CM | POA: Diagnosis not present

## 2021-12-31 DIAGNOSIS — I110 Hypertensive heart disease with heart failure: Secondary | ICD-10-CM | POA: Diagnosis not present

## 2022-01-01 DIAGNOSIS — I4510 Unspecified right bundle-branch block: Secondary | ICD-10-CM | POA: Diagnosis not present

## 2022-01-01 DIAGNOSIS — M1990 Unspecified osteoarthritis, unspecified site: Secondary | ICD-10-CM | POA: Diagnosis not present

## 2022-01-01 DIAGNOSIS — I4891 Unspecified atrial fibrillation: Secondary | ICD-10-CM | POA: Diagnosis not present

## 2022-01-01 DIAGNOSIS — R350 Frequency of micturition: Secondary | ICD-10-CM | POA: Diagnosis not present

## 2022-01-01 DIAGNOSIS — R3915 Urgency of urination: Secondary | ICD-10-CM | POA: Diagnosis not present

## 2022-01-01 DIAGNOSIS — I110 Hypertensive heart disease with heart failure: Secondary | ICD-10-CM | POA: Diagnosis not present

## 2022-01-01 DIAGNOSIS — R32 Unspecified urinary incontinence: Secondary | ICD-10-CM | POA: Diagnosis not present

## 2022-01-01 DIAGNOSIS — E039 Hypothyroidism, unspecified: Secondary | ICD-10-CM | POA: Diagnosis not present

## 2022-01-01 DIAGNOSIS — I5043 Acute on chronic combined systolic (congestive) and diastolic (congestive) heart failure: Secondary | ICD-10-CM | POA: Diagnosis not present

## 2022-01-01 DIAGNOSIS — K219 Gastro-esophageal reflux disease without esophagitis: Secondary | ICD-10-CM | POA: Diagnosis not present

## 2022-01-02 ENCOUNTER — Telehealth: Payer: Self-pay | Admitting: Cardiovascular Disease

## 2022-01-02 DIAGNOSIS — R350 Frequency of micturition: Secondary | ICD-10-CM | POA: Diagnosis not present

## 2022-01-02 DIAGNOSIS — E039 Hypothyroidism, unspecified: Secondary | ICD-10-CM | POA: Diagnosis not present

## 2022-01-02 DIAGNOSIS — I4510 Unspecified right bundle-branch block: Secondary | ICD-10-CM | POA: Diagnosis not present

## 2022-01-02 DIAGNOSIS — R32 Unspecified urinary incontinence: Secondary | ICD-10-CM | POA: Diagnosis not present

## 2022-01-02 DIAGNOSIS — I4891 Unspecified atrial fibrillation: Secondary | ICD-10-CM | POA: Diagnosis not present

## 2022-01-02 DIAGNOSIS — K219 Gastro-esophageal reflux disease without esophagitis: Secondary | ICD-10-CM | POA: Diagnosis not present

## 2022-01-02 DIAGNOSIS — I110 Hypertensive heart disease with heart failure: Secondary | ICD-10-CM | POA: Diagnosis not present

## 2022-01-02 DIAGNOSIS — M1990 Unspecified osteoarthritis, unspecified site: Secondary | ICD-10-CM | POA: Diagnosis not present

## 2022-01-02 DIAGNOSIS — I5043 Acute on chronic combined systolic (congestive) and diastolic (congestive) heart failure: Secondary | ICD-10-CM | POA: Diagnosis not present

## 2022-01-02 DIAGNOSIS — R3915 Urgency of urination: Secondary | ICD-10-CM | POA: Diagnosis not present

## 2022-01-02 NOTE — Telephone Encounter (Signed)
Patient has never been seen in office by Dr Oval Linsey, only HF clinic ?Will forward to Heart Failure for review  ?

## 2022-01-02 NOTE — Telephone Encounter (Signed)
Pt c/o swelling: STAT is pt has developed SOB within 24 hours ? ?If swelling, where is the swelling located? Lower extremity ? ?How much weight have you gained and in what time span? N/A ? ?Have you gained 3 pounds in a day or 5 pounds in a week? Yes  ? ?Do you have a log of your daily weights (if so, list)? Yes  ? ?Are you currently taking a fluid pill? Yes  ? ?Are you currently SOB? Yes  ? ?Have you traveled recently? No   ?

## 2022-01-02 NOTE — Telephone Encounter (Signed)
Discussed with Overton Mam NP, recommendations below  ? ?Continue extra Lasix 20 mg daily until weight back down to 195 and move appointment to 1-2 week f/u ? ?Advised daughter and moved appointment to next week  ?

## 2022-01-03 DIAGNOSIS — E871 Hypo-osmolality and hyponatremia: Secondary | ICD-10-CM | POA: Diagnosis not present

## 2022-01-03 DIAGNOSIS — E039 Hypothyroidism, unspecified: Secondary | ICD-10-CM | POA: Diagnosis not present

## 2022-01-07 DIAGNOSIS — R32 Unspecified urinary incontinence: Secondary | ICD-10-CM | POA: Diagnosis not present

## 2022-01-07 DIAGNOSIS — R3915 Urgency of urination: Secondary | ICD-10-CM | POA: Diagnosis not present

## 2022-01-07 DIAGNOSIS — R350 Frequency of micturition: Secondary | ICD-10-CM | POA: Diagnosis not present

## 2022-01-07 DIAGNOSIS — I4510 Unspecified right bundle-branch block: Secondary | ICD-10-CM | POA: Diagnosis not present

## 2022-01-07 DIAGNOSIS — K219 Gastro-esophageal reflux disease without esophagitis: Secondary | ICD-10-CM | POA: Diagnosis not present

## 2022-01-07 DIAGNOSIS — M1990 Unspecified osteoarthritis, unspecified site: Secondary | ICD-10-CM | POA: Diagnosis not present

## 2022-01-07 DIAGNOSIS — I4891 Unspecified atrial fibrillation: Secondary | ICD-10-CM | POA: Diagnosis not present

## 2022-01-07 DIAGNOSIS — I110 Hypertensive heart disease with heart failure: Secondary | ICD-10-CM | POA: Diagnosis not present

## 2022-01-07 DIAGNOSIS — I5043 Acute on chronic combined systolic (congestive) and diastolic (congestive) heart failure: Secondary | ICD-10-CM | POA: Diagnosis not present

## 2022-01-07 DIAGNOSIS — E039 Hypothyroidism, unspecified: Secondary | ICD-10-CM | POA: Diagnosis not present

## 2022-01-08 ENCOUNTER — Ambulatory Visit (HOSPITAL_BASED_OUTPATIENT_CLINIC_OR_DEPARTMENT_OTHER): Payer: Medicare HMO | Admitting: Cardiovascular Disease

## 2022-01-08 ENCOUNTER — Encounter (HOSPITAL_BASED_OUTPATIENT_CLINIC_OR_DEPARTMENT_OTHER): Payer: Self-pay | Admitting: Cardiovascular Disease

## 2022-01-08 VITALS — BP 100/58 | HR 64 | Ht 66.5 in | Wt 195.9 lb

## 2022-01-08 DIAGNOSIS — N3000 Acute cystitis without hematuria: Secondary | ICD-10-CM | POA: Diagnosis not present

## 2022-01-08 DIAGNOSIS — I10 Essential (primary) hypertension: Secondary | ICD-10-CM

## 2022-01-08 DIAGNOSIS — R0602 Shortness of breath: Secondary | ICD-10-CM | POA: Diagnosis not present

## 2022-01-08 DIAGNOSIS — I480 Paroxysmal atrial fibrillation: Secondary | ICD-10-CM

## 2022-01-08 DIAGNOSIS — I5041 Acute combined systolic (congestive) and diastolic (congestive) heart failure: Secondary | ICD-10-CM | POA: Diagnosis not present

## 2022-01-08 MED ORDER — SACUBITRIL-VALSARTAN 24-26 MG PO TABS: ORAL_TABLET | ORAL | 5 refills | Status: DC

## 2022-01-08 NOTE — Patient Instructions (Addendum)
Medication Instructions:  ?DECREASE YOUR ENTRESTO TO 1/2 TABLET TWICE A DAY  ? ?INCREASE YOUR FUROSEMIDE TO 20 MG 2 DAILY FOR  2 DAYS  ? ?INCREASE YOUR POTASSIUM TO 20 MEQ 2 DAILY FOR 2 DAYS  ? ?*If you need a refill on your cardiac medications before your next appointment, please call your pharmacy* ? ? ?Lab Work: ?BMET/BNP/MAGNESIUM/URINALYSIS TODAY  ? ?If you have labs (blood work) drawn today and your tests are completely normal, you will receive your results only by: ?MyChart Message (if you have MyChart) OR ?A paper copy in the mail ?If you have any lab test that is abnormal or we need to change your treatment, we will call you to review the results. ? ?Testing/Procedures: ?NONE  ? ?Follow-Up: ?At [facility], you and your health needs are our priority.  As part of our continuing mission to provide you with exceptional heart care, we have created designated Provider Care Teams.  These Care Teams include your primary Cardiologist (physician) and Advanced Practice Providers (APPs -  Physician Assistants and Nurse Practitioners) who all work together to provide you with the care you need, when you need it. ? ?We recommend signing up for the patient portal called "MyChart".  Sign up information is provided on this After Visit Summary.  MyChart is used to connect with patients for Virtual Visits (Telemedicine).  Patients are able to view lab/test results, encounter notes, upcoming appointments, etc.  Non-urgent messages can be sent to your provider as well.   ?To learn more about what you can do with MyChart, go to [email or web address].   ? ?Your next appointment:   ?1 month(s) ? ?The format for your next appointment:   ?In Person ? ?Provider:   ?Overton Mam NP  ? ?Other Instructions ?WILL REACH OUT TO ISABEL, SOCIAL WORKER TO SEE IF ANY HELP IN HOME CAN BE PROVIDED  ? ?

## 2022-01-08 NOTE — Progress Notes (Signed)
?Cardiology Office Note:   ? ?Date:  01/09/2022  ? ?ID:  [name], DOB Apr 25, 1931, MRN [id number] ? ?PCP:  Barbara Seashore, MD ?  ?[name] ?Cardiologist:  None    ? ?Referring MD: Barbara Seashore, MD  ? ?No chief complaint on file. ? ? ?History of Present Illness:   ? ?[name] is a 88 y.o. female with a hx of hypertension, hyperlipidemia, paroxysmal atrial fibrillation, chronic systolic and diastolic heart failure here for evaluation. She was seen in the ED 12/08/2021 for onset A-fib. She presented with shortness of breath and volume overload. BNP was elevated to 453. Echo showed LVEF 45% with moderate mitral regurgitation.  She diuresed 12L. She underwent TEE/DCCV. She went back into atrial fibrillation and they planned for rate control. In the hospital, she was hyponatremic, fluoxetine was discontinued and her synthroid dose was adjusted. She followed up with Barbara Parma PA and was doing well. Prior to seeing [name], she was treated at urgent care for a UTI.  ? ?Today, she is accompanied by her daughter. ?She reports she is still feeling weak. Her daughter reports she is able to stand up and sit down by herself. She doesn't sleep good every night. She reports having a productive cough with clear sputum. She has used about 2 bottles of delsym. The cough keeps her up at night sometimes. Notes that the cough feels like it is only on the left side of her chest. Denies fever, chills and sneezing. ?She reports bilateral LE edema. She uses lasix. Her daughter reports urinary incontinence at night and she often wakes up with her bed and clothes wet. She adds she does not know when she urinates. ?She is feeling lightheaded at this visit. Her blood pressure is low. ?BP Readings from Last 3 Encounters:  ?01/08/22 (!) 100/58  ?12/30/21 130/72  ?12/25/21 129/72  ?  ?Her daughter reports that she is always itching on different parts of her body. She is not sure if it is due to  anxiety or if she has another UTI. ? ?She denies chest pain, palpitations, , headaches, syncope, orthopnea, PND.  ? ?Past Medical History:  ?Diagnosis Date  ? Dyspnea   ? diastolic dysfunction by echo 2012  ? GERD (gastroesophageal reflux disease)   ? Hypertension   ? Mild mitral and aortic regurgitation   ? Osteoarthritis   ? Right bundle branch block 2014  ? Thyroid disease   ? ? ?Past Surgical History:  ?Procedure Laterality Date  ? arm surgery    ? following an accident  ? CARDIOVERSION N/A 12/11/2021  ? Procedure: CARDIOVERSION;  Surgeon: Barbara Lean, MD;  Location: [facility];  Service: Cardiovascular;  Laterality: N/A;  ? KNEE SURGERY    ? Following an accident  ? TEE WITHOUT CARDIOVERSION N/A 12/11/2021  ? Procedure: TRANSESOPHAGEAL ECHOCARDIOGRAM (TEE);  Surgeon: Barbara Lean, MD;  Location: [place];  Service: Cardiovascular;  Laterality: N/A;  ? TONSILLECTOMY AND ADENOIDECTOMY    ? VAGINAL HYSTERECTOMY    ? ? ?Current Medications: ?Current Meds  ?Medication Sig  ? amiodarone (PACERONE) 200 MG tablet Take 1 tablet (200 mg total) by mouth daily. Take 2tabs twice a day for 6days then 1 tab daily  ? apixaban (ELIQUIS) 5 MG TABS tablet Take 1 tablet (5 mg total) by mouth 2 (two) times daily.  ? FLUoxetine (PROZAC) 40 MG capsule Take 1 capsule by mouth every morning.  ? levothyroxine (SYNTHROID) 200 MCG tablet Take 1 tablet (200  mcg total) by mouth daily before breakfast.  ? LORazepam (ATIVAN) 0.5 MG tablet Take 1 tablet (0.5 mg total) by mouth daily as needed for anxiety or sleep.  ? lovastatin (MEVACOR) 40 MG tablet Take 1 tablet by mouth every evening.  ? metoprolol succinate (TOPROL-XL) 50 MG 24 hr tablet Take 1 tablet (50 mg total) by mouth daily. Take with or immediately following a meal.  ? omeprazole (PRILOSEC) 10 MG capsule Take 10 mg by mouth every evening.  ? [DISCONTINUED] furosemide (LASIX) 20 MG tablet Take 1 tablet (20 mg total) by mouth daily. And take 20 mg tablet extra  for weight gain of 3lbs in 1 day  ? [DISCONTINUED] potassium chloride SA (KLOR-CON M) 20 MEQ tablet Take 1 tablet (20 mEq total) by mouth daily.  ? [DISCONTINUED] sacubitril-valsartan (ENTRESTO) 24-26 MG Take 1 tablet by mouth 2 (two) times daily. (Patient taking differently: Take 1 tablet by mouth as directed. TAKE 1/2 TABLET TWICE A DAY)  ?  ? ?Allergies:   Patient has no known allergies.  ? ?Social History  ? ?Socioeconomic History  ? Marital status: Married  ?  Spouse name: [name]  ? Number of children: 1  ? Years of education: Not on file  ? Highest education level: High school graduate  ?Occupational History  ? Occupation: retired  ?Tobacco Use  ? Smoking status: Never  ? Smokeless tobacco: Never  ?Vaping Use  ? Vaping Use: Never used  ?Substance and Sexual Activity  ? Alcohol use: No  ? Drug use: No  ? Sexual activity: Not on file  ?Other Topics Concern  ? Not on file  ?Social History Narrative  ? Not on file  ? ?Social Determinants of Health  ? ?Financial Resource Strain: Low Risk   ? Difficulty of Paying Living Expenses: Not hard at all  ?Food Insecurity: No Food Insecurity  ? Worried About Charity fundraiser in the Last Year: Never true  ? Ran Out of Food in the Last Year: Never true  ?Transportation Needs: No Transportation Needs  ? Lack of Transportation (Medical): No  ? Lack of Transportation (Non-Medical): No  ?Physical Activity: Not on file  ?Stress: Not on file  ?Social Connections: Not on file  ?  ? ?Family History: ?The patient's family history includes Heart attack in her mother. ? ?ROS:   ?Please see the history of present illness.   ?(+) cough ?(+) itching ?(+) urinary incontinence ?(+) bilateral LE edema ?(+) shortness of breath ?(+) fatigue  ? All other systems reviewed and are negative. ? ?EKGs/Labs/Other Studies Reviewed:   ? ?The following studies were reviewed today: ?ECHO 12/09/2021: ? ?1. Left ventricular ejection fraction, by estimation, is 45%. The left  ?ventricle has mildly  decreased function. The left ventricle demonstrates global hypokinesis. Left ventricular diastolic parameters are indeterminate.  ? 2. Right ventricular systolic function is mildly reduced. The right  ?ventricular size is normal. There is mildly elevated pulmonary artery systolic pressure. The estimated right ventricular systolic pressure is 37.9 mmHg.  ? 3. Left atrial size was mildly dilated.  ? 4. The mitral valve is abnormal. Moderate mitral valve regurgitation. No evidence of mitral stenosis.  ? 5. The aortic valve is tricuspid. There is moderate calcification of the aortic valve. Aortic valve regurgitation is mild. Aortic valve  ?sclerosis/calcification is present, without any evidence of aortic  ?stenosis.  ? 6. Aortic dilatation noted. There is mild dilatation of the ascending  ?aorta, measuring 40 mm.  ? 7.  The inferior vena cava is dilated in size with <50% respiratory  ?variability, suggesting right atrial pressure of 15 mmHg.  ? 8. There was a left pleural effusion.  ? 9. The patient was in atrial fibrillation.  ? ?ECHO TEE 12/11/2021: ? ?1. Left ventricular ejection fraction, by estimation, is 45 to 50%. Left ventricular ejection fraction by 3D volume is 46 %. The left ventricle has mildly decreased function. The left ventricle demonstrates global hypokinesis. Left ventricular diastolic  ? function could not be evaluated.  ? 2. Right ventricular systolic function is normal. The right ventricular size is normal.  ? 3. Left atrial size was mildly dilated. No left atrial/left atrial  ?appendage thrombus was detected.  ? 4. Large pleural effusion in the left lateral region.  ? 5. The mitral valve is grossly normal. Moderate mitral valve  ?regurgitation. No evidence of mitral stenosis.  ? 6. Tricuspid valve regurgitation is mild to moderate.  ? 7. The aortic valve is tricuspid. Aortic valve regurgitation is mild to  ?moderate. No aortic stenosis is present. Aortic valve mean gradient  ?measures 3.0 mmHg.  ?  8. There is mild (Grade II) plaque involving the descending aorta.  ? ?EKG:  EKG is  ordered today.  The ekg ordered today demonstrates a-fib rate- 64 bpm RBBB ? ?Recent Labs: ?12/08/2021: TSH 7.190 ?12/12/18

## 2022-01-08 NOTE — Assessment & Plan Note (Signed)
Blood pressure is running low.  Reduce Entresto to 1/2 tablet.  Continue metoprolol. ?

## 2022-01-08 NOTE — Assessment & Plan Note (Signed)
Patient has hypotension and dark urine.  Check a urinalysis today. ?

## 2022-01-08 NOTE — Assessment & Plan Note (Signed)
She remains in atrial fibrillation.  She converted back to atrial fibrillation after cardioversion very quickly.  She is now on amiodarone.  She remains in A-fib at follow-up, we will plan to schedule outpatient DCCV.  Continue Eliquis.  Continue metoprolol and amiodarone. ?

## 2022-01-08 NOTE — Assessment & Plan Note (Signed)
LVEF 45%.  She is volume overloaded and short of breath.  Check a basic metabolic panel and a BNP today.  She is going to work on getting some compression socks.  We will also up her Lasix to 40 mg daily for 2 days.  While she does that she will increase her potassium to 40 mEq for 2 days.  Continue metoprolol.  Her blood pressure is low when she is feeling lightheaded.  We will reduce her Entresto to 1/2 tablet, especially while she is getting more aggressive diuresis. ?

## 2022-01-09 ENCOUNTER — Telehealth (HOSPITAL_BASED_OUTPATIENT_CLINIC_OR_DEPARTMENT_OTHER): Payer: Self-pay | Admitting: Cardiovascular Disease

## 2022-01-09 ENCOUNTER — Telehealth: Payer: Self-pay | Admitting: Licensed Clinical Social Worker

## 2022-01-09 ENCOUNTER — Encounter (HOSPITAL_BASED_OUTPATIENT_CLINIC_OR_DEPARTMENT_OTHER): Payer: Self-pay | Admitting: Cardiovascular Disease

## 2022-01-09 DIAGNOSIS — R600 Localized edema: Secondary | ICD-10-CM | POA: Diagnosis not present

## 2022-01-09 DIAGNOSIS — M1990 Unspecified osteoarthritis, unspecified site: Secondary | ICD-10-CM | POA: Diagnosis not present

## 2022-01-09 DIAGNOSIS — R0989 Other specified symptoms and signs involving the circulatory and respiratory systems: Secondary | ICD-10-CM | POA: Diagnosis not present

## 2022-01-09 DIAGNOSIS — E871 Hypo-osmolality and hyponatremia: Secondary | ICD-10-CM | POA: Diagnosis not present

## 2022-01-09 DIAGNOSIS — I5043 Acute on chronic combined systolic (congestive) and diastolic (congestive) heart failure: Secondary | ICD-10-CM | POA: Diagnosis not present

## 2022-01-09 DIAGNOSIS — I4891 Unspecified atrial fibrillation: Secondary | ICD-10-CM | POA: Diagnosis not present

## 2022-01-09 DIAGNOSIS — K219 Gastro-esophageal reflux disease without esophagitis: Secondary | ICD-10-CM | POA: Diagnosis not present

## 2022-01-09 DIAGNOSIS — R32 Unspecified urinary incontinence: Secondary | ICD-10-CM | POA: Diagnosis not present

## 2022-01-09 DIAGNOSIS — I4510 Unspecified right bundle-branch block: Secondary | ICD-10-CM | POA: Diagnosis not present

## 2022-01-09 DIAGNOSIS — E039 Hypothyroidism, unspecified: Secondary | ICD-10-CM | POA: Diagnosis not present

## 2022-01-09 DIAGNOSIS — R3915 Urgency of urination: Secondary | ICD-10-CM | POA: Diagnosis not present

## 2022-01-09 DIAGNOSIS — R410 Disorientation, unspecified: Secondary | ICD-10-CM | POA: Diagnosis not present

## 2022-01-09 DIAGNOSIS — R350 Frequency of micturition: Secondary | ICD-10-CM | POA: Diagnosis not present

## 2022-01-09 DIAGNOSIS — I110 Hypertensive heart disease with heart failure: Secondary | ICD-10-CM | POA: Diagnosis not present

## 2022-01-09 LAB — BRAIN NATRIURETIC PEPTIDE: BNP: 810.1 pg/mL — ABNORMAL HIGH (ref 0.0–100.0)

## 2022-01-09 LAB — URINALYSIS
Bilirubin, UA: NEGATIVE
Glucose, UA: NEGATIVE
Ketones, UA: NEGATIVE
Nitrite, UA: NEGATIVE
Protein,UA: NEGATIVE
RBC, UA: NEGATIVE
Specific Gravity, UA: 1.015 (ref 1.005–1.030)
Urobilinogen, Ur: 1 mg/dL (ref 0.2–1.0)
pH, UA: 5.5 (ref 5.0–7.5)

## 2022-01-09 LAB — BASIC METABOLIC PANEL
BUN/Creatinine Ratio: 11 — ABNORMAL LOW (ref 12–28)
BUN: 12 mg/dL (ref 8–27)
CO2: 19 mmol/L — ABNORMAL LOW (ref 20–29)
Calcium: 9.2 mg/dL (ref 8.7–10.3)
Chloride: 97 mmol/L (ref 96–106)
Creatinine, Ser: 1.13 mg/dL — ABNORMAL HIGH (ref 0.57–1.00)
Glucose: 98 mg/dL (ref 70–99)
Potassium: 5.5 mmol/L — ABNORMAL HIGH (ref 3.5–5.2)
Sodium: 132 mmol/L — ABNORMAL LOW (ref 134–144)
eGFR: 47 mL/min/{1.73_m2} — ABNORMAL LOW (ref >59)

## 2022-01-09 LAB — MAGNESIUM: Magnesium: 1.8 mg/dL (ref 1.6–2.3)

## 2022-01-09 MED ORDER — POTASSIUM CHLORIDE CRYS ER 20 MEQ PO TBCR
20.0000 meq | EXTENDED_RELEASE_TABLET | Freq: Every day | ORAL | 3 refills | Status: DC
Start: 2022-01-09 — End: 2022-11-20

## 2022-01-09 MED ORDER — FUROSEMIDE 20 MG PO TABS: 20.0000 mg | ORAL_TABLET | Freq: Every day | ORAL | 3 refills | Status: DC

## 2022-01-09 NOTE — Telephone Encounter (Signed)
[name]: ? ?1. Name of Medication: tessalon pearls ? ?2. How are you currently taking this medication (dosage and times per day)? Patient took two pearls  ? ?3. Are you having a reaction (difficulty breathing--STAT)? Bad coughing spell ? ?4. What is your medication issue? Daughter states that the patient two tessalon pearls yesterday then about 30-60 minutes later the patient had a severe coughing spell and the daughter had to give the patient an emergency dose of albuterol  ? ? ?Daughter also had questions regarding the change in dose for the potassium and lasix ?

## 2022-01-09 NOTE — Telephone Encounter (Signed)
Patients daughter called in to ask questions about patient lasix and potassium dosing. Clarified with Alvina Filbert, RN. Patient should be taking '40mg'$  potassium for days and '40mg'$  lasix for 2 two days.  ? ?Patient also noted to be having coughing fits twice. Patients daughter advised to follow up with PCP and then daughter mentioned shortness of breath after coughing. Patient is seeing PCP today, but was advised that if condition worsens before then to take them to the ED.  ? ?Will also send refills as requested by the patients daughter.  ? ? ? ? ? ? ?"DECREASE YOUR ENTRESTO TO 1/2 TABLET TWICE A DAY  ?  ?INCREASE YOUR FUROSEMIDE TO 20 MG 2 DAILY FOR  2 DAYS  ?  ?INCREASE YOUR POTASSIUM TO 20 MEQ 2 DAILY FOR 2 DAYS"- per office visit note ?  ?

## 2022-01-09 NOTE — Telephone Encounter (Signed)
Attempted pt home number this afternoon to f/u on supports and get permission to speak with pt [name] (no current DPR on file). Jonita Albee at home number who took my name, title, and number and shared that pt daughter would call me back, she is currently at the hospital with pt spouse. Maia Breslow for assistance, I remain available.  ? ?Westley Hummer, MSW, LCSW ?Clinical Social Worker II ?[facility]/Vascular Care Navigation  ?[phone]- work cell phone (preferred) ?[phone]- desk phone ? ?

## 2022-01-10 ENCOUNTER — Telehealth: Payer: Self-pay | Admitting: Licensed Clinical Social Worker

## 2022-01-10 DIAGNOSIS — K219 Gastro-esophageal reflux disease without esophagitis: Secondary | ICD-10-CM | POA: Diagnosis not present

## 2022-01-10 DIAGNOSIS — I110 Hypertensive heart disease with heart failure: Secondary | ICD-10-CM | POA: Diagnosis not present

## 2022-01-10 DIAGNOSIS — M1990 Unspecified osteoarthritis, unspecified site: Secondary | ICD-10-CM | POA: Diagnosis not present

## 2022-01-10 DIAGNOSIS — R350 Frequency of micturition: Secondary | ICD-10-CM | POA: Diagnosis not present

## 2022-01-10 DIAGNOSIS — I5043 Acute on chronic combined systolic (congestive) and diastolic (congestive) heart failure: Secondary | ICD-10-CM | POA: Diagnosis not present

## 2022-01-10 DIAGNOSIS — R32 Unspecified urinary incontinence: Secondary | ICD-10-CM | POA: Diagnosis not present

## 2022-01-10 DIAGNOSIS — I4510 Unspecified right bundle-branch block: Secondary | ICD-10-CM | POA: Diagnosis not present

## 2022-01-10 DIAGNOSIS — R3915 Urgency of urination: Secondary | ICD-10-CM | POA: Diagnosis not present

## 2022-01-10 DIAGNOSIS — I4891 Unspecified atrial fibrillation: Secondary | ICD-10-CM | POA: Diagnosis not present

## 2022-01-10 DIAGNOSIS — E039 Hypothyroidism, unspecified: Secondary | ICD-10-CM | POA: Diagnosis not present

## 2022-01-10 NOTE — Telephone Encounter (Signed)
The following email was sent to [email or web address] per pt daughter request. ? ?Hi Melissa, ?Thank you so much for your time today. I know you have a lot on your plate (understatement) and that some of this is just adding to it.  ? ?As I mentioned even if you aren't sure if they qualify, I would recommend you speak with someone at the [facility] [phone]) to see if they are eligible for Medicaid. Most people assume they are not eligible but it is something worth looking into if you anticipate they will continue to need hands on/facility care. Medicare as you're aware does not pay for long term/custodial care of any kind. If eligible it can take 60-90 days to process. ? ?I have attached a private duty care agency list to this email, personal care agencies sometimes close if staffing becomes an issue so some agencies may no longer be providing services. Some of the major PCS companies in this area include [facility] and [facility]. I also have attached the [facility] flyer and made a referral to them. If you do not hear from them please let me know and I will f/u to ensure they have received it.  ?  ?If financially you feel it would be helpful/feasible to have a geriatric case manager, I have had several patients utilize [facility] and her small team with [facility]:  [place] [email or web address] [email or web address]) they specialize in case management and options planning with families that need to make these very decisions and can be very hands on or just a consult.  ?  ?Don't hesitate to let me know if you need any other guidance/information! ?  ?Michiel Cowboy, social work ? ?Westley Hummer, MSW, LCSW ?Clinical Social Worker II ?[facility] Heart/Vascular Care Navigation  ?[phone]- work cell phone (preferred) ?[phone]- desk phone ? ?

## 2022-01-10 NOTE — Progress Notes (Signed)
?Heart and Vascular Care Navigation ? ?01/10/2022 ? ?[name] ?1931/01/11 ?[id number] ? ?Reason for Referral:  ?Home care options ?Engaged with patient by telephone for initial visit for Heart and Vascular Care Coordination. ?                                                                                                  ?Assessment:           ?LCSW received a call back from pt daughter [name] [phone]). Introduced self, role, reason for call. She confirmed pt home address, usually lives with her husband  [name] but he is currently in the hospital. [name] assists pt and pt spouse as able. They have some in home help and have had [facility] coming in for [place] and RN. Pt daughter shares her spouse has cancer, she has a lot on her plate. Has called Development worker, community and some agencies but how found that since they do not have Medicaid that they would be facing a private pay situation. They do have some finances saved at this time. She is unsure how to proceed, feels overwhelmed by resources provided by [employer] and [employer] (time of making calls, f/u etc).  ? ?LCSW shared that Medicaid is what covers most individuals PCS at home, without it they would be charged for private pay as their [name] does not cover that assistance. Offered to send pt PCS list, cautioned that we cannot recommend particular PCS agencies and some may have closed over [place]. I also offered referral to [facility] given pt new afib and HF diagnoses. Shared they are not required to participate in program but it could be an additional supportive option. Pt daughter agreeable to both.  ? ?I also explained that some patients utilize local agencies such as [facility] which is a [employer]. It does cost but it may save them money and time calling/arranging things. Pt daughter interested in me also sending this. I will e-mail them to pt daughter at [email or web address] and text her so she has my  contact/when they are sent.            ? ?Pt daughter did express that having to manage all of this has caused her stress/she has had a hard time balancing it all. Provided her with verbal support and encouraged her to f/u if there is any f/u or additional resources she is interested in.             ? ?HRT/VAS Care Coordination   ? ? Patients Home Cardiology Office [facility]  ? Outpatient Care Team Social Worker  ? Social Worker Name: Margarito Liner [facility] [phone]  ? Living arrangements for the past 2 months Single Family Home  ? Lives with: Spouse  ? Patient Current Insurance Coverage Managed Medicare  ? Patient Has Concern With Paying Medical Bills No  ? Does Patient Have Prescription Coverage? Yes  ? Home Assistive Devices/Equipment Walker (specify type); Hearing aid; Shower chair with back  4 wheel walker  ? [place]  ? ?  ? ? ?  Social History:                                                                             ?SDOH Screenings  ? ?Alcohol Screen: Low Risk   ? Last Alcohol Screening Score (AUDIT): 0  ?Depression (PHQ2-9): Not on file  ?Financial Resource Strain: Low Risk   ? Difficulty of Paying Living Expenses: Not hard at all  ?Food Insecurity: No Food Insecurity  ? Worried About Charity fundraiser in the Last Year: Never true  ? Ran Out of Food in the Last Year: Never true  ?Housing: Low Risk   ? Last Housing Risk Score: 0  ?Physical Activity: Not on file  ?Social Connections: Not on file  ?Stress: Not on file  ?Tobacco Use: Low Risk   ? Smoking Tobacco Use: Never  ? Smokeless Tobacco Use: Never  ? Passive Exposure: Not on file  ?Transportation Needs: No Transportation Needs  ? Lack of Transportation (Medical): No  ? Lack of Transportation (Non-Medical): No  ? ? ?SDOH Interventions: ?Financial Resources:  DSS for financial assistance  ? ? ? ?Other Care Navigation Interventions:    ? ?Patient Referred to: [facility], PCS list, [facility]   ? ?Follow-up plan:   ?Emailed pt daughter resources and texted her a reminder. Both received. Referral faxed to [facility] at [phone], f/u sent to Reche Dixon, coordinator who shared they are closed today and Monday. She will f/u with me on Tuesday to ensure received. I remain available. Will f/u if I dont hear from [facility] next week.  ? ? ? ?

## 2022-01-14 ENCOUNTER — Telehealth: Payer: Self-pay | Admitting: Cardiovascular Disease

## 2022-01-14 DIAGNOSIS — R350 Frequency of micturition: Secondary | ICD-10-CM | POA: Diagnosis not present

## 2022-01-14 DIAGNOSIS — I4510 Unspecified right bundle-branch block: Secondary | ICD-10-CM | POA: Diagnosis not present

## 2022-01-14 DIAGNOSIS — L89152 Pressure ulcer of sacral region, stage 2: Secondary | ICD-10-CM | POA: Diagnosis not present

## 2022-01-14 DIAGNOSIS — I4891 Unspecified atrial fibrillation: Secondary | ICD-10-CM | POA: Diagnosis not present

## 2022-01-14 DIAGNOSIS — R269 Unspecified abnormalities of gait and mobility: Secondary | ICD-10-CM | POA: Diagnosis not present

## 2022-01-14 DIAGNOSIS — R32 Unspecified urinary incontinence: Secondary | ICD-10-CM | POA: Diagnosis not present

## 2022-01-14 DIAGNOSIS — M1990 Unspecified osteoarthritis, unspecified site: Secondary | ICD-10-CM | POA: Diagnosis not present

## 2022-01-14 DIAGNOSIS — K219 Gastro-esophageal reflux disease without esophagitis: Secondary | ICD-10-CM | POA: Diagnosis not present

## 2022-01-14 DIAGNOSIS — R3915 Urgency of urination: Secondary | ICD-10-CM | POA: Diagnosis not present

## 2022-01-14 DIAGNOSIS — E039 Hypothyroidism, unspecified: Secondary | ICD-10-CM | POA: Diagnosis not present

## 2022-01-14 DIAGNOSIS — I5043 Acute on chronic combined systolic (congestive) and diastolic (congestive) heart failure: Secondary | ICD-10-CM | POA: Diagnosis not present

## 2022-01-14 DIAGNOSIS — I110 Hypertensive heart disease with heart failure: Secondary | ICD-10-CM | POA: Diagnosis not present

## 2022-01-14 MED ORDER — BENZONATATE 100 MG PO CAPS: 100.0000 mg | ORAL_CAPSULE | Freq: Three times a day (TID) | ORAL | 0 refills | Status: DC | PRN

## 2022-01-14 NOTE — Telephone Encounter (Signed)
[name]: ? ?1. Name of Medication:  ?Albuterol ? ?2. How are you currently taking this medication (dosage and times per day)?  ? ?3. Are you having a reaction (difficulty breathing--STAT)?  ? ?4. What is your medication issue?  ? ? ?Patient's daughter is requesting to speak with Dr. Blenda Mounts nurse. She states she has concerns with patient's persistent cough. She states when she coughs a lot sometimes she becomes SOB. She states during recent admission the patient was prescribed Albuterol for emergency use. Patient's daughter would like to confirm whether or not Dr. Oval Linsey agrees with this. Also mentioned patient's PCP advised patient to take an antihistamine and Mucinex or a steroid. ?

## 2022-01-14 NOTE — Telephone Encounter (Signed)
Okay to use Albuterol PRN for shortness of breath.  ?Recommend Tessalon '100mg'$  TID PRN for cough (20 tablets - 0 refills).  ?Please inquire what her recent weights/BP are - Lasix recently increased and if still volume overloaded could be causing shortness of breath.  ? ?Loel Dubonnet, NP  ?

## 2022-01-14 NOTE — Telephone Encounter (Signed)
RN returned call to patients daughter, and provided the following recommendations. Patient is down to 191 pounds after increased lasix. Tessalon sent to preferred pharmacy  ? ? ?"Okay to use Albuterol PRN for shortness of breath.  ?Recommend Tessalon '100mg'$  TID PRN for cough (20 tablets - 0 refills).  ?Please inquire what her recent weights/BP are - Lasix recently increased and if still volume overloaded could be causing shortness of breath.  ?  ?Loel Dubonnet, NP " ?

## 2022-01-14 NOTE — Telephone Encounter (Signed)
Okay to advise patient to use Albuterol inhaler and then proceed to ED if does not resolve symptoms.  ?

## 2022-01-16 ENCOUNTER — Other Ambulatory Visit: Payer: Self-pay

## 2022-01-16 DIAGNOSIS — E039 Hypothyroidism, unspecified: Secondary | ICD-10-CM | POA: Diagnosis not present

## 2022-01-16 DIAGNOSIS — I5043 Acute on chronic combined systolic (congestive) and diastolic (congestive) heart failure: Secondary | ICD-10-CM | POA: Diagnosis not present

## 2022-01-16 DIAGNOSIS — I4510 Unspecified right bundle-branch block: Secondary | ICD-10-CM | POA: Diagnosis not present

## 2022-01-16 DIAGNOSIS — K219 Gastro-esophageal reflux disease without esophagitis: Secondary | ICD-10-CM | POA: Diagnosis not present

## 2022-01-16 DIAGNOSIS — R350 Frequency of micturition: Secondary | ICD-10-CM | POA: Diagnosis not present

## 2022-01-16 DIAGNOSIS — R3915 Urgency of urination: Secondary | ICD-10-CM | POA: Diagnosis not present

## 2022-01-16 DIAGNOSIS — I110 Hypertensive heart disease with heart failure: Secondary | ICD-10-CM | POA: Diagnosis not present

## 2022-01-16 DIAGNOSIS — M1990 Unspecified osteoarthritis, unspecified site: Secondary | ICD-10-CM | POA: Diagnosis not present

## 2022-01-16 DIAGNOSIS — I4891 Unspecified atrial fibrillation: Secondary | ICD-10-CM

## 2022-01-16 DIAGNOSIS — R32 Unspecified urinary incontinence: Secondary | ICD-10-CM | POA: Diagnosis not present

## 2022-01-16 MED ORDER — APIXABAN 5 MG PO TABS: 5.0000 mg | ORAL_TABLET | Freq: Two times a day (BID) | ORAL | 1 refills | Status: DC

## 2022-01-16 NOTE — Telephone Encounter (Signed)
Prescription refill request for Eliquis received. ?Indication: Afib  ?Last office visit:01/08/22 Oval Linsey) ?Scr: 1.13 (01/08/22) ?Age: 88 ?Weight: 88.9kg ? ?Appropriate dose and refill sent to requested pharmacy.  ?

## 2022-01-20 DIAGNOSIS — I110 Hypertensive heart disease with heart failure: Secondary | ICD-10-CM | POA: Diagnosis not present

## 2022-01-20 DIAGNOSIS — R3915 Urgency of urination: Secondary | ICD-10-CM | POA: Diagnosis not present

## 2022-01-20 DIAGNOSIS — I4891 Unspecified atrial fibrillation: Secondary | ICD-10-CM | POA: Diagnosis not present

## 2022-01-20 DIAGNOSIS — I4510 Unspecified right bundle-branch block: Secondary | ICD-10-CM | POA: Diagnosis not present

## 2022-01-20 DIAGNOSIS — E039 Hypothyroidism, unspecified: Secondary | ICD-10-CM | POA: Diagnosis not present

## 2022-01-20 DIAGNOSIS — M1990 Unspecified osteoarthritis, unspecified site: Secondary | ICD-10-CM | POA: Diagnosis not present

## 2022-01-20 DIAGNOSIS — R32 Unspecified urinary incontinence: Secondary | ICD-10-CM | POA: Diagnosis not present

## 2022-01-20 DIAGNOSIS — R350 Frequency of micturition: Secondary | ICD-10-CM | POA: Diagnosis not present

## 2022-01-20 DIAGNOSIS — K219 Gastro-esophageal reflux disease without esophagitis: Secondary | ICD-10-CM | POA: Diagnosis not present

## 2022-01-20 DIAGNOSIS — I5043 Acute on chronic combined systolic (congestive) and diastolic (congestive) heart failure: Secondary | ICD-10-CM | POA: Diagnosis not present

## 2022-01-21 ENCOUNTER — Telehealth (HOSPITAL_BASED_OUTPATIENT_CLINIC_OR_DEPARTMENT_OTHER): Payer: Self-pay | Admitting: *Deleted

## 2022-01-21 ENCOUNTER — Telehealth: Payer: Self-pay | Admitting: Licensed Clinical Social Worker

## 2022-01-21 DIAGNOSIS — R7989 Other specified abnormal findings of blood chemistry: Secondary | ICD-10-CM

## 2022-01-21 DIAGNOSIS — R0602 Shortness of breath: Secondary | ICD-10-CM

## 2022-01-21 DIAGNOSIS — Z5181 Encounter for therapeutic drug level monitoring: Secondary | ICD-10-CM

## 2022-01-21 DIAGNOSIS — E875 Hyperkalemia: Secondary | ICD-10-CM

## 2022-01-21 NOTE — Telephone Encounter (Signed)
-----   Message from [name], MD sent at 01/17/2022  9:04 AM EDT ----- ?No UTI.  Heart failure labs are elevated.  Potassium is high.  It is good that the lasix was increased.  Next time if she has to take extra lasix she can still just take 20 mEq of potassium instead of 40 mEq. ?

## 2022-01-21 NOTE — Telephone Encounter (Signed)
Recommend BNP, BMP, CBC for labs. CBC will let us assess for any significant infection.  ? ?We sent Tessalon last week to use PRN for cough, any improvement? If not, recommend follow up with PCP.  ? ?Any recent weights or BP would be helpful to determine whether volume status increased. As of last week she had returned to weight of 191 lbs which was her baseline. If more than 2 lbs above dry weight, recommend extra tablet of Lasix until returns to dry weight of 191 lbs.  ? ?Loel Dubonnet, NP  ?

## 2022-01-21 NOTE — Telephone Encounter (Signed)
LCSW made f/u call to Reche Dixon w/ [facility] to f/u on referral. Her team was checking on referral details last week. Message left requesting call back. ? ?Westley Hummer, MSW, LCSW ?Clinical Social Worker II ?[facility] Heart/Vascular Care Navigation  ?[phone]- work cell phone (preferred) ?[phone]- desk phone ? ? ?

## 2022-01-21 NOTE — Telephone Encounter (Signed)
Patient having lot of nonproductive cough, weight stable, some swelling. K+level was prior to increased Lasix and Potassium intake  ?Discussed with [place] Mam NP will have patient get repeat labs this week ?Advised daughter, verbalized understanding  ?

## 2022-01-22 ENCOUNTER — Telehealth: Payer: Self-pay | Admitting: Cardiovascular Disease

## 2022-01-22 DIAGNOSIS — R059 Cough, unspecified: Secondary | ICD-10-CM | POA: Diagnosis not present

## 2022-01-22 DIAGNOSIS — R7989 Other specified abnormal findings of blood chemistry: Secondary | ICD-10-CM | POA: Diagnosis not present

## 2022-01-22 DIAGNOSIS — R32 Unspecified urinary incontinence: Secondary | ICD-10-CM | POA: Diagnosis not present

## 2022-01-22 DIAGNOSIS — M1990 Unspecified osteoarthritis, unspecified site: Secondary | ICD-10-CM | POA: Diagnosis not present

## 2022-01-22 DIAGNOSIS — E871 Hypo-osmolality and hyponatremia: Secondary | ICD-10-CM | POA: Diagnosis not present

## 2022-01-22 DIAGNOSIS — I5043 Acute on chronic combined systolic (congestive) and diastolic (congestive) heart failure: Secondary | ICD-10-CM | POA: Diagnosis not present

## 2022-01-22 DIAGNOSIS — I110 Hypertensive heart disease with heart failure: Secondary | ICD-10-CM | POA: Diagnosis not present

## 2022-01-22 DIAGNOSIS — E875 Hyperkalemia: Secondary | ICD-10-CM | POA: Diagnosis not present

## 2022-01-22 DIAGNOSIS — K219 Gastro-esophageal reflux disease without esophagitis: Secondary | ICD-10-CM | POA: Diagnosis not present

## 2022-01-22 DIAGNOSIS — Z5181 Encounter for therapeutic drug level monitoring: Secondary | ICD-10-CM | POA: Diagnosis not present

## 2022-01-22 DIAGNOSIS — I10 Essential (primary) hypertension: Secondary | ICD-10-CM | POA: Diagnosis not present

## 2022-01-22 DIAGNOSIS — R7303 Prediabetes: Secondary | ICD-10-CM | POA: Diagnosis not present

## 2022-01-22 DIAGNOSIS — I4510 Unspecified right bundle-branch block: Secondary | ICD-10-CM | POA: Diagnosis not present

## 2022-01-22 DIAGNOSIS — R350 Frequency of micturition: Secondary | ICD-10-CM | POA: Diagnosis not present

## 2022-01-22 DIAGNOSIS — R3915 Urgency of urination: Secondary | ICD-10-CM | POA: Diagnosis not present

## 2022-01-22 DIAGNOSIS — I4891 Unspecified atrial fibrillation: Secondary | ICD-10-CM | POA: Diagnosis not present

## 2022-01-22 DIAGNOSIS — E039 Hypothyroidism, unspecified: Secondary | ICD-10-CM | POA: Diagnosis not present

## 2022-01-22 NOTE — Telephone Encounter (Signed)
RN returned call to patient's daughter and advised her of recommendations provided by Coletta Memos, NP of getting a humidifier and using saline nasal rinses. Patient is to follow up with PCP this afternoon.  ?

## 2022-01-22 NOTE — Telephone Encounter (Signed)
Patient's daughter states the patient has been having nose bleeds for at least the past few weeks. She states they occur almost every day. ?

## 2022-01-23 DIAGNOSIS — I4510 Unspecified right bundle-branch block: Secondary | ICD-10-CM | POA: Diagnosis not present

## 2022-01-23 DIAGNOSIS — I5043 Acute on chronic combined systolic (congestive) and diastolic (congestive) heart failure: Secondary | ICD-10-CM | POA: Diagnosis not present

## 2022-01-23 DIAGNOSIS — K219 Gastro-esophageal reflux disease without esophagitis: Secondary | ICD-10-CM | POA: Diagnosis not present

## 2022-01-23 DIAGNOSIS — R350 Frequency of micturition: Secondary | ICD-10-CM | POA: Diagnosis not present

## 2022-01-23 DIAGNOSIS — E039 Hypothyroidism, unspecified: Secondary | ICD-10-CM | POA: Diagnosis not present

## 2022-01-23 DIAGNOSIS — R32 Unspecified urinary incontinence: Secondary | ICD-10-CM | POA: Diagnosis not present

## 2022-01-23 DIAGNOSIS — I4891 Unspecified atrial fibrillation: Secondary | ICD-10-CM | POA: Diagnosis not present

## 2022-01-23 DIAGNOSIS — M1990 Unspecified osteoarthritis, unspecified site: Secondary | ICD-10-CM | POA: Diagnosis not present

## 2022-01-23 DIAGNOSIS — R3915 Urgency of urination: Secondary | ICD-10-CM | POA: Diagnosis not present

## 2022-01-23 DIAGNOSIS — I110 Hypertensive heart disease with heart failure: Secondary | ICD-10-CM | POA: Diagnosis not present

## 2022-01-23 LAB — BASIC METABOLIC PANEL
BUN/Creatinine Ratio: 12 (ref 12–28)
BUN: 13 mg/dL (ref 8–27)
CO2: 22 mmol/L (ref 20–29)
Calcium: 8.9 mg/dL (ref 8.7–10.3)
Chloride: 97 mmol/L (ref 96–106)
Creatinine, Ser: 1.06 mg/dL — ABNORMAL HIGH (ref 0.57–1.00)
Glucose: 93 mg/dL (ref 70–99)
Potassium: 4.9 mmol/L (ref 3.5–5.2)
Sodium: 133 mmol/L — ABNORMAL LOW (ref 134–144)
eGFR: 51 mL/min/{1.73_m2} — ABNORMAL LOW (ref >59)

## 2022-01-23 LAB — BRAIN NATRIURETIC PEPTIDE: BNP: 699.9 pg/mL — ABNORMAL HIGH (ref 0.0–100.0)

## 2022-01-23 NOTE — Telephone Encounter (Signed)
Received f/u from [facility] on 4/19. Pt daughter has been contacted for [facility] and requested program information be mailed to her so she can review. Pt has PCP at this time.  ? ?I remain available. Appreciate [facility]'s involvement to see if it would be a good fit for pt.  ? ?Westley Hummer, MSW, LCSW ?Clinical Social Worker II ?[facility]/Vascular Care Navigation  ?[phone]- work cell phone (preferred) ?[phone]- desk phone ? ?

## 2022-01-23 NOTE — Telephone Encounter (Signed)
Labs reported today  ?

## 2022-01-25 ENCOUNTER — Other Ambulatory Visit: Payer: Self-pay | Admitting: Family

## 2022-01-28 ENCOUNTER — Other Ambulatory Visit: Payer: Self-pay | Admitting: Family

## 2022-01-28 DIAGNOSIS — E785 Hyperlipidemia, unspecified: Secondary | ICD-10-CM | POA: Diagnosis not present

## 2022-01-28 DIAGNOSIS — E876 Hypokalemia: Secondary | ICD-10-CM | POA: Diagnosis not present

## 2022-01-28 DIAGNOSIS — I5043 Acute on chronic combined systolic (congestive) and diastolic (congestive) heart failure: Secondary | ICD-10-CM | POA: Diagnosis not present

## 2022-01-28 DIAGNOSIS — R3915 Urgency of urination: Secondary | ICD-10-CM | POA: Diagnosis not present

## 2022-01-28 DIAGNOSIS — K219 Gastro-esophageal reflux disease without esophagitis: Secondary | ICD-10-CM | POA: Diagnosis not present

## 2022-01-28 DIAGNOSIS — D649 Anemia, unspecified: Secondary | ICD-10-CM | POA: Diagnosis not present

## 2022-01-28 DIAGNOSIS — Z6832 Body mass index (BMI) 32.0-32.9, adult: Secondary | ICD-10-CM | POA: Diagnosis not present

## 2022-01-28 DIAGNOSIS — L89322 Pressure ulcer of left buttock, stage 2: Secondary | ICD-10-CM | POA: Diagnosis not present

## 2022-01-28 DIAGNOSIS — J189 Pneumonia, unspecified organism: Secondary | ICD-10-CM | POA: Diagnosis not present

## 2022-01-28 DIAGNOSIS — R32 Unspecified urinary incontinence: Secondary | ICD-10-CM | POA: Diagnosis not present

## 2022-01-28 DIAGNOSIS — Z7901 Long term (current) use of anticoagulants: Secondary | ICD-10-CM | POA: Diagnosis not present

## 2022-01-28 DIAGNOSIS — I4510 Unspecified right bundle-branch block: Secondary | ICD-10-CM | POA: Diagnosis not present

## 2022-01-28 DIAGNOSIS — I110 Hypertensive heart disease with heart failure: Secondary | ICD-10-CM | POA: Diagnosis not present

## 2022-01-28 DIAGNOSIS — R350 Frequency of micturition: Secondary | ICD-10-CM | POA: Diagnosis not present

## 2022-01-28 DIAGNOSIS — I4891 Unspecified atrial fibrillation: Secondary | ICD-10-CM | POA: Diagnosis not present

## 2022-01-28 DIAGNOSIS — E669 Obesity, unspecified: Secondary | ICD-10-CM | POA: Diagnosis not present

## 2022-01-28 DIAGNOSIS — M1990 Unspecified osteoarthritis, unspecified site: Secondary | ICD-10-CM | POA: Diagnosis not present

## 2022-01-28 DIAGNOSIS — E871 Hypo-osmolality and hyponatremia: Secondary | ICD-10-CM | POA: Diagnosis not present

## 2022-01-28 DIAGNOSIS — I080 Rheumatic disorders of both mitral and aortic valves: Secondary | ICD-10-CM | POA: Diagnosis not present

## 2022-01-28 DIAGNOSIS — E039 Hypothyroidism, unspecified: Secondary | ICD-10-CM | POA: Diagnosis not present

## 2022-01-29 ENCOUNTER — Telehealth: Payer: Self-pay | Admitting: Cardiovascular Disease

## 2022-01-29 DIAGNOSIS — I110 Hypertensive heart disease with heart failure: Secondary | ICD-10-CM | POA: Diagnosis not present

## 2022-01-29 DIAGNOSIS — E871 Hypo-osmolality and hyponatremia: Secondary | ICD-10-CM | POA: Diagnosis not present

## 2022-01-29 DIAGNOSIS — I4891 Unspecified atrial fibrillation: Secondary | ICD-10-CM | POA: Diagnosis not present

## 2022-01-29 DIAGNOSIS — L89322 Pressure ulcer of left buttock, stage 2: Secondary | ICD-10-CM | POA: Diagnosis not present

## 2022-01-29 DIAGNOSIS — E876 Hypokalemia: Secondary | ICD-10-CM | POA: Diagnosis not present

## 2022-01-29 DIAGNOSIS — Z7901 Long term (current) use of anticoagulants: Secondary | ICD-10-CM | POA: Diagnosis not present

## 2022-01-29 DIAGNOSIS — I4510 Unspecified right bundle-branch block: Secondary | ICD-10-CM | POA: Diagnosis not present

## 2022-01-29 DIAGNOSIS — R3915 Urgency of urination: Secondary | ICD-10-CM | POA: Diagnosis not present

## 2022-01-29 DIAGNOSIS — E039 Hypothyroidism, unspecified: Secondary | ICD-10-CM | POA: Diagnosis not present

## 2022-01-29 DIAGNOSIS — I5043 Acute on chronic combined systolic (congestive) and diastolic (congestive) heart failure: Secondary | ICD-10-CM | POA: Diagnosis not present

## 2022-01-29 DIAGNOSIS — J189 Pneumonia, unspecified organism: Secondary | ICD-10-CM | POA: Diagnosis not present

## 2022-01-29 DIAGNOSIS — E785 Hyperlipidemia, unspecified: Secondary | ICD-10-CM | POA: Diagnosis not present

## 2022-01-29 DIAGNOSIS — R350 Frequency of micturition: Secondary | ICD-10-CM | POA: Diagnosis not present

## 2022-01-29 DIAGNOSIS — E669 Obesity, unspecified: Secondary | ICD-10-CM | POA: Diagnosis not present

## 2022-01-29 DIAGNOSIS — D649 Anemia, unspecified: Secondary | ICD-10-CM | POA: Diagnosis not present

## 2022-01-29 DIAGNOSIS — M1990 Unspecified osteoarthritis, unspecified site: Secondary | ICD-10-CM | POA: Diagnosis not present

## 2022-01-29 DIAGNOSIS — Z6832 Body mass index (BMI) 32.0-32.9, adult: Secondary | ICD-10-CM | POA: Diagnosis not present

## 2022-01-29 DIAGNOSIS — I080 Rheumatic disorders of both mitral and aortic valves: Secondary | ICD-10-CM | POA: Diagnosis not present

## 2022-01-29 DIAGNOSIS — K219 Gastro-esophageal reflux disease without esophagitis: Secondary | ICD-10-CM | POA: Diagnosis not present

## 2022-01-29 DIAGNOSIS — R32 Unspecified urinary incontinence: Secondary | ICD-10-CM | POA: Diagnosis not present

## 2022-01-29 NOTE — Telephone Encounter (Signed)
Patient's daughter called stating her mother has a really bad cough that has not gone away.  She has tried Mucinex, Zyrtec, Desylm, and the tessalon perles. Nothing seems to be helping her.  The cough is really bad that 3-4 times through-out the day and night she will cough so hard it's to the point that it's hard for her to catch her breathe. Daughter wants to know if there is something else she can try.  ?

## 2022-01-29 NOTE — Telephone Encounter (Signed)
Please advise 

## 2022-01-30 NOTE — Telephone Encounter (Signed)
Spoke with daughter regarding mother ?Wakes up with headache, no blood pressure taken at that time. When blood pressure taken by aides/nurse always good ?Patient get Mucinex 600 mg twice a day, Zyrtec 24 hour daily, Tessalon Perles during day, and Desylm at night but still has bad cough  ?No more swelling than usual, denies shortness of breath  ?Discussed with [place] Mam NP and she suggested call PCP  ? ?Advised daughter, verbalized understanding  ? ?

## 2022-02-03 DIAGNOSIS — R3915 Urgency of urination: Secondary | ICD-10-CM | POA: Diagnosis not present

## 2022-02-03 DIAGNOSIS — M1990 Unspecified osteoarthritis, unspecified site: Secondary | ICD-10-CM | POA: Diagnosis not present

## 2022-02-03 DIAGNOSIS — R32 Unspecified urinary incontinence: Secondary | ICD-10-CM | POA: Diagnosis not present

## 2022-02-03 DIAGNOSIS — E039 Hypothyroidism, unspecified: Secondary | ICD-10-CM | POA: Diagnosis not present

## 2022-02-03 DIAGNOSIS — E785 Hyperlipidemia, unspecified: Secondary | ICD-10-CM | POA: Diagnosis not present

## 2022-02-03 DIAGNOSIS — I4510 Unspecified right bundle-branch block: Secondary | ICD-10-CM | POA: Diagnosis not present

## 2022-02-03 DIAGNOSIS — E669 Obesity, unspecified: Secondary | ICD-10-CM | POA: Diagnosis not present

## 2022-02-03 DIAGNOSIS — E871 Hypo-osmolality and hyponatremia: Secondary | ICD-10-CM | POA: Diagnosis not present

## 2022-02-03 DIAGNOSIS — L89322 Pressure ulcer of left buttock, stage 2: Secondary | ICD-10-CM | POA: Diagnosis not present

## 2022-02-03 DIAGNOSIS — I4891 Unspecified atrial fibrillation: Secondary | ICD-10-CM | POA: Diagnosis not present

## 2022-02-03 DIAGNOSIS — I5043 Acute on chronic combined systolic (congestive) and diastolic (congestive) heart failure: Secondary | ICD-10-CM | POA: Diagnosis not present

## 2022-02-03 DIAGNOSIS — D649 Anemia, unspecified: Secondary | ICD-10-CM | POA: Diagnosis not present

## 2022-02-03 DIAGNOSIS — Z6832 Body mass index (BMI) 32.0-32.9, adult: Secondary | ICD-10-CM | POA: Diagnosis not present

## 2022-02-03 DIAGNOSIS — I110 Hypertensive heart disease with heart failure: Secondary | ICD-10-CM | POA: Diagnosis not present

## 2022-02-03 DIAGNOSIS — J189 Pneumonia, unspecified organism: Secondary | ICD-10-CM | POA: Diagnosis not present

## 2022-02-03 DIAGNOSIS — Z7901 Long term (current) use of anticoagulants: Secondary | ICD-10-CM | POA: Diagnosis not present

## 2022-02-03 DIAGNOSIS — I080 Rheumatic disorders of both mitral and aortic valves: Secondary | ICD-10-CM | POA: Diagnosis not present

## 2022-02-03 DIAGNOSIS — K219 Gastro-esophageal reflux disease without esophagitis: Secondary | ICD-10-CM | POA: Diagnosis not present

## 2022-02-03 DIAGNOSIS — E876 Hypokalemia: Secondary | ICD-10-CM | POA: Diagnosis not present

## 2022-02-03 DIAGNOSIS — R350 Frequency of micturition: Secondary | ICD-10-CM | POA: Diagnosis not present

## 2022-02-04 DIAGNOSIS — J189 Pneumonia, unspecified organism: Secondary | ICD-10-CM | POA: Diagnosis not present

## 2022-02-04 DIAGNOSIS — Z6832 Body mass index (BMI) 32.0-32.9, adult: Secondary | ICD-10-CM | POA: Diagnosis not present

## 2022-02-04 DIAGNOSIS — Z7901 Long term (current) use of anticoagulants: Secondary | ICD-10-CM | POA: Diagnosis not present

## 2022-02-04 DIAGNOSIS — R3915 Urgency of urination: Secondary | ICD-10-CM | POA: Diagnosis not present

## 2022-02-04 DIAGNOSIS — E876 Hypokalemia: Secondary | ICD-10-CM | POA: Diagnosis not present

## 2022-02-04 DIAGNOSIS — I110 Hypertensive heart disease with heart failure: Secondary | ICD-10-CM | POA: Diagnosis not present

## 2022-02-04 DIAGNOSIS — E871 Hypo-osmolality and hyponatremia: Secondary | ICD-10-CM | POA: Diagnosis not present

## 2022-02-04 DIAGNOSIS — I5043 Acute on chronic combined systolic (congestive) and diastolic (congestive) heart failure: Secondary | ICD-10-CM | POA: Diagnosis not present

## 2022-02-04 DIAGNOSIS — R350 Frequency of micturition: Secondary | ICD-10-CM | POA: Diagnosis not present

## 2022-02-04 DIAGNOSIS — E669 Obesity, unspecified: Secondary | ICD-10-CM | POA: Diagnosis not present

## 2022-02-04 DIAGNOSIS — D649 Anemia, unspecified: Secondary | ICD-10-CM | POA: Diagnosis not present

## 2022-02-04 DIAGNOSIS — I080 Rheumatic disorders of both mitral and aortic valves: Secondary | ICD-10-CM | POA: Diagnosis not present

## 2022-02-04 DIAGNOSIS — L89322 Pressure ulcer of left buttock, stage 2: Secondary | ICD-10-CM | POA: Diagnosis not present

## 2022-02-04 DIAGNOSIS — I4891 Unspecified atrial fibrillation: Secondary | ICD-10-CM | POA: Diagnosis not present

## 2022-02-04 DIAGNOSIS — K219 Gastro-esophageal reflux disease without esophagitis: Secondary | ICD-10-CM | POA: Diagnosis not present

## 2022-02-04 DIAGNOSIS — I4510 Unspecified right bundle-branch block: Secondary | ICD-10-CM | POA: Diagnosis not present

## 2022-02-04 DIAGNOSIS — M1990 Unspecified osteoarthritis, unspecified site: Secondary | ICD-10-CM | POA: Diagnosis not present

## 2022-02-04 DIAGNOSIS — R32 Unspecified urinary incontinence: Secondary | ICD-10-CM | POA: Diagnosis not present

## 2022-02-04 DIAGNOSIS — E039 Hypothyroidism, unspecified: Secondary | ICD-10-CM | POA: Diagnosis not present

## 2022-02-04 DIAGNOSIS — E785 Hyperlipidemia, unspecified: Secondary | ICD-10-CM | POA: Diagnosis not present

## 2022-02-10 DIAGNOSIS — E039 Hypothyroidism, unspecified: Secondary | ICD-10-CM | POA: Diagnosis not present

## 2022-02-10 DIAGNOSIS — L89322 Pressure ulcer of left buttock, stage 2: Secondary | ICD-10-CM | POA: Diagnosis not present

## 2022-02-10 DIAGNOSIS — I4891 Unspecified atrial fibrillation: Secondary | ICD-10-CM | POA: Diagnosis not present

## 2022-02-10 DIAGNOSIS — D649 Anemia, unspecified: Secondary | ICD-10-CM | POA: Diagnosis not present

## 2022-02-10 DIAGNOSIS — E785 Hyperlipidemia, unspecified: Secondary | ICD-10-CM | POA: Diagnosis not present

## 2022-02-10 DIAGNOSIS — R3915 Urgency of urination: Secondary | ICD-10-CM | POA: Diagnosis not present

## 2022-02-10 DIAGNOSIS — I4510 Unspecified right bundle-branch block: Secondary | ICD-10-CM | POA: Diagnosis not present

## 2022-02-10 DIAGNOSIS — E876 Hypokalemia: Secondary | ICD-10-CM | POA: Diagnosis not present

## 2022-02-10 DIAGNOSIS — J189 Pneumonia, unspecified organism: Secondary | ICD-10-CM | POA: Diagnosis not present

## 2022-02-10 DIAGNOSIS — R32 Unspecified urinary incontinence: Secondary | ICD-10-CM | POA: Diagnosis not present

## 2022-02-10 DIAGNOSIS — Z7901 Long term (current) use of anticoagulants: Secondary | ICD-10-CM | POA: Diagnosis not present

## 2022-02-10 DIAGNOSIS — I5043 Acute on chronic combined systolic (congestive) and diastolic (congestive) heart failure: Secondary | ICD-10-CM | POA: Diagnosis not present

## 2022-02-10 DIAGNOSIS — I080 Rheumatic disorders of both mitral and aortic valves: Secondary | ICD-10-CM | POA: Diagnosis not present

## 2022-02-10 DIAGNOSIS — E669 Obesity, unspecified: Secondary | ICD-10-CM | POA: Diagnosis not present

## 2022-02-10 DIAGNOSIS — K219 Gastro-esophageal reflux disease without esophagitis: Secondary | ICD-10-CM | POA: Diagnosis not present

## 2022-02-10 DIAGNOSIS — M1990 Unspecified osteoarthritis, unspecified site: Secondary | ICD-10-CM | POA: Diagnosis not present

## 2022-02-10 DIAGNOSIS — Z6832 Body mass index (BMI) 32.0-32.9, adult: Secondary | ICD-10-CM | POA: Diagnosis not present

## 2022-02-10 DIAGNOSIS — I110 Hypertensive heart disease with heart failure: Secondary | ICD-10-CM | POA: Diagnosis not present

## 2022-02-10 DIAGNOSIS — E871 Hypo-osmolality and hyponatremia: Secondary | ICD-10-CM | POA: Diagnosis not present

## 2022-02-10 DIAGNOSIS — R350 Frequency of micturition: Secondary | ICD-10-CM | POA: Diagnosis not present

## 2022-02-11 ENCOUNTER — Ambulatory Visit (HOSPITAL_BASED_OUTPATIENT_CLINIC_OR_DEPARTMENT_OTHER): Payer: Medicare HMO | Admitting: Cardiovascular Disease

## 2022-02-11 DIAGNOSIS — E039 Hypothyroidism, unspecified: Secondary | ICD-10-CM | POA: Diagnosis not present

## 2022-02-11 DIAGNOSIS — E669 Obesity, unspecified: Secondary | ICD-10-CM | POA: Diagnosis not present

## 2022-02-11 DIAGNOSIS — J189 Pneumonia, unspecified organism: Secondary | ICD-10-CM | POA: Diagnosis not present

## 2022-02-11 DIAGNOSIS — I4510 Unspecified right bundle-branch block: Secondary | ICD-10-CM | POA: Diagnosis not present

## 2022-02-11 DIAGNOSIS — L89322 Pressure ulcer of left buttock, stage 2: Secondary | ICD-10-CM | POA: Diagnosis not present

## 2022-02-11 DIAGNOSIS — M1990 Unspecified osteoarthritis, unspecified site: Secondary | ICD-10-CM | POA: Diagnosis not present

## 2022-02-11 DIAGNOSIS — R3915 Urgency of urination: Secondary | ICD-10-CM | POA: Diagnosis not present

## 2022-02-11 DIAGNOSIS — E871 Hypo-osmolality and hyponatremia: Secondary | ICD-10-CM | POA: Diagnosis not present

## 2022-02-11 DIAGNOSIS — Z7901 Long term (current) use of anticoagulants: Secondary | ICD-10-CM | POA: Diagnosis not present

## 2022-02-11 DIAGNOSIS — K219 Gastro-esophageal reflux disease without esophagitis: Secondary | ICD-10-CM | POA: Diagnosis not present

## 2022-02-11 DIAGNOSIS — R32 Unspecified urinary incontinence: Secondary | ICD-10-CM | POA: Diagnosis not present

## 2022-02-11 DIAGNOSIS — E876 Hypokalemia: Secondary | ICD-10-CM | POA: Diagnosis not present

## 2022-02-11 DIAGNOSIS — I080 Rheumatic disorders of both mitral and aortic valves: Secondary | ICD-10-CM | POA: Diagnosis not present

## 2022-02-11 DIAGNOSIS — E785 Hyperlipidemia, unspecified: Secondary | ICD-10-CM | POA: Diagnosis not present

## 2022-02-11 DIAGNOSIS — I4891 Unspecified atrial fibrillation: Secondary | ICD-10-CM | POA: Diagnosis not present

## 2022-02-11 DIAGNOSIS — I5043 Acute on chronic combined systolic (congestive) and diastolic (congestive) heart failure: Secondary | ICD-10-CM | POA: Diagnosis not present

## 2022-02-11 DIAGNOSIS — R350 Frequency of micturition: Secondary | ICD-10-CM | POA: Diagnosis not present

## 2022-02-11 DIAGNOSIS — I110 Hypertensive heart disease with heart failure: Secondary | ICD-10-CM | POA: Diagnosis not present

## 2022-02-11 DIAGNOSIS — Z6832 Body mass index (BMI) 32.0-32.9, adult: Secondary | ICD-10-CM | POA: Diagnosis not present

## 2022-02-11 DIAGNOSIS — D649 Anemia, unspecified: Secondary | ICD-10-CM | POA: Diagnosis not present

## 2022-02-17 ENCOUNTER — Telehealth (HOSPITAL_BASED_OUTPATIENT_CLINIC_OR_DEPARTMENT_OTHER): Payer: Self-pay | Admitting: Cardiovascular Disease

## 2022-02-17 DIAGNOSIS — R3915 Urgency of urination: Secondary | ICD-10-CM | POA: Diagnosis not present

## 2022-02-17 DIAGNOSIS — I5043 Acute on chronic combined systolic (congestive) and diastolic (congestive) heart failure: Secondary | ICD-10-CM | POA: Diagnosis not present

## 2022-02-17 DIAGNOSIS — E871 Hypo-osmolality and hyponatremia: Secondary | ICD-10-CM | POA: Diagnosis not present

## 2022-02-17 DIAGNOSIS — L89322 Pressure ulcer of left buttock, stage 2: Secondary | ICD-10-CM | POA: Diagnosis not present

## 2022-02-17 DIAGNOSIS — R350 Frequency of micturition: Secondary | ICD-10-CM | POA: Diagnosis not present

## 2022-02-17 DIAGNOSIS — E669 Obesity, unspecified: Secondary | ICD-10-CM | POA: Diagnosis not present

## 2022-02-17 DIAGNOSIS — I080 Rheumatic disorders of both mitral and aortic valves: Secondary | ICD-10-CM | POA: Diagnosis not present

## 2022-02-17 DIAGNOSIS — E039 Hypothyroidism, unspecified: Secondary | ICD-10-CM | POA: Diagnosis not present

## 2022-02-17 DIAGNOSIS — R32 Unspecified urinary incontinence: Secondary | ICD-10-CM | POA: Diagnosis not present

## 2022-02-17 DIAGNOSIS — E785 Hyperlipidemia, unspecified: Secondary | ICD-10-CM | POA: Diagnosis not present

## 2022-02-17 DIAGNOSIS — I110 Hypertensive heart disease with heart failure: Secondary | ICD-10-CM | POA: Diagnosis not present

## 2022-02-17 DIAGNOSIS — K219 Gastro-esophageal reflux disease without esophagitis: Secondary | ICD-10-CM | POA: Diagnosis not present

## 2022-02-17 DIAGNOSIS — J189 Pneumonia, unspecified organism: Secondary | ICD-10-CM | POA: Diagnosis not present

## 2022-02-17 DIAGNOSIS — M1990 Unspecified osteoarthritis, unspecified site: Secondary | ICD-10-CM | POA: Diagnosis not present

## 2022-02-17 DIAGNOSIS — I4891 Unspecified atrial fibrillation: Secondary | ICD-10-CM | POA: Diagnosis not present

## 2022-02-17 DIAGNOSIS — E876 Hypokalemia: Secondary | ICD-10-CM | POA: Diagnosis not present

## 2022-02-17 DIAGNOSIS — D649 Anemia, unspecified: Secondary | ICD-10-CM | POA: Diagnosis not present

## 2022-02-17 DIAGNOSIS — Z7901 Long term (current) use of anticoagulants: Secondary | ICD-10-CM | POA: Diagnosis not present

## 2022-02-17 DIAGNOSIS — Z6832 Body mass index (BMI) 32.0-32.9, adult: Secondary | ICD-10-CM | POA: Diagnosis not present

## 2022-02-17 DIAGNOSIS — I4510 Unspecified right bundle-branch block: Secondary | ICD-10-CM | POA: Diagnosis not present

## 2022-02-17 NOTE — Telephone Encounter (Signed)
Pt c/o Shortness Of Breath: STAT if SOB developed within the last 24 hours or pt is noticeably SOB on the phone ? ?1. Are you currently SOB (can you hear that pt is SOB on the phone)? no ? ?2. How long have you been experiencing SOB? Since wt increased 02/12/22 ? ?3. Are you SOB when sitting or when up moving around? Only when she wakes up in the morning  ? ?4. Are you currently experiencing any other symptoms? Swelling in legs and ankles ? ?Pt c/o swelling: STAT is pt has developed SOB within 24 hours ? ?If swelling, where is the swelling located? Feet and ankles  ? ?How much weight have you gained and in what time span?  ? ?Have you gained 3 pounds in a day or 5 pounds in a week? 3 lbs overnight from 02/12/22-02/13/22 ? ?Do you have a log of your daily weights (if so, list)?  ? 02/12/22: 197 ? 02/13/22: 200.6 ?02/14/22: 199 ? ?Are you currently taking a fluid pill? yes ? ?Are you currently SOB? Upon waking up ? ?Have you traveled recently? No ? ?Daughter of the patient called. The patient has had some SOB and has been using an inhaler. The patient's Husband is on Hospice care but has a nebulizer. The daughter wanted to know if the patient would benefit from using her Husband's Nebulizer ?

## 2022-02-17 NOTE — Telephone Encounter (Signed)
Patient information printed and given to Alvina Filbert, LPN and Dr. Oval Linsey in clinic, also routed to their inbox.  ?

## 2022-02-18 ENCOUNTER — Telehealth: Payer: Self-pay | Admitting: Cardiovascular Disease

## 2022-02-18 DIAGNOSIS — R0602 Shortness of breath: Secondary | ICD-10-CM

## 2022-02-18 DIAGNOSIS — R600 Localized edema: Secondary | ICD-10-CM

## 2022-02-18 NOTE — Telephone Encounter (Signed)
Pt's daughter calling back to f/u from yesterday's call regarding pt having SOB. Daughter states that she never received a call back from anyone. Please advise ?

## 2022-02-18 NOTE — Telephone Encounter (Signed)
Rn returned call to patient's daughter ( ok per DPR)  ? ? ?No shortness of breath today- but had a slight swimmyheaded feeling, Daughter states she has been "playing around with her lasix" currently takes '20mg'$  daily, increased her to '40mg'$  for two days and then alternated '40mg'$  and '20mg'$  for a couple days of lasix. Patient does endorse that the swelling seems to be a little better today.  ? ?Patient's daughter states that her father is in home hospice and they have a nebulizer machine in home that her mother could potentially use. RN discouraged the use of someone else's medication. Patient does have an albuterol inhaler for shortness of breath. Reviewed with patient's daughter that she may use inhaler as prescribed and if it did not help that she would need to be evaluated in ED.  ? ? ?Routing to Dr. Oval Linsey and Alvina Filbert for update to make advisement  ?

## 2022-02-19 DIAGNOSIS — K219 Gastro-esophageal reflux disease without esophagitis: Secondary | ICD-10-CM | POA: Diagnosis not present

## 2022-02-19 DIAGNOSIS — R059 Cough, unspecified: Secondary | ICD-10-CM | POA: Diagnosis not present

## 2022-02-19 DIAGNOSIS — I4891 Unspecified atrial fibrillation: Secondary | ICD-10-CM | POA: Diagnosis not present

## 2022-02-19 DIAGNOSIS — E669 Obesity, unspecified: Secondary | ICD-10-CM | POA: Diagnosis not present

## 2022-02-19 DIAGNOSIS — E785 Hyperlipidemia, unspecified: Secondary | ICD-10-CM | POA: Diagnosis not present

## 2022-02-19 DIAGNOSIS — R32 Unspecified urinary incontinence: Secondary | ICD-10-CM | POA: Diagnosis not present

## 2022-02-19 DIAGNOSIS — R600 Localized edema: Secondary | ICD-10-CM | POA: Diagnosis not present

## 2022-02-19 DIAGNOSIS — M1990 Unspecified osteoarthritis, unspecified site: Secondary | ICD-10-CM | POA: Diagnosis not present

## 2022-02-19 DIAGNOSIS — Z6832 Body mass index (BMI) 32.0-32.9, adult: Secondary | ICD-10-CM | POA: Diagnosis not present

## 2022-02-19 DIAGNOSIS — I10 Essential (primary) hypertension: Secondary | ICD-10-CM | POA: Diagnosis not present

## 2022-02-19 DIAGNOSIS — D6869 Other thrombophilia: Secondary | ICD-10-CM | POA: Diagnosis not present

## 2022-02-19 DIAGNOSIS — G4700 Insomnia, unspecified: Secondary | ICD-10-CM | POA: Diagnosis not present

## 2022-02-20 DIAGNOSIS — Z7901 Long term (current) use of anticoagulants: Secondary | ICD-10-CM | POA: Diagnosis not present

## 2022-02-20 DIAGNOSIS — J189 Pneumonia, unspecified organism: Secondary | ICD-10-CM | POA: Diagnosis not present

## 2022-02-20 DIAGNOSIS — L89322 Pressure ulcer of left buttock, stage 2: Secondary | ICD-10-CM | POA: Diagnosis not present

## 2022-02-20 DIAGNOSIS — I4891 Unspecified atrial fibrillation: Secondary | ICD-10-CM | POA: Diagnosis not present

## 2022-02-20 DIAGNOSIS — E669 Obesity, unspecified: Secondary | ICD-10-CM | POA: Diagnosis not present

## 2022-02-20 DIAGNOSIS — R3915 Urgency of urination: Secondary | ICD-10-CM | POA: Diagnosis not present

## 2022-02-20 DIAGNOSIS — I5043 Acute on chronic combined systolic (congestive) and diastolic (congestive) heart failure: Secondary | ICD-10-CM | POA: Diagnosis not present

## 2022-02-20 DIAGNOSIS — I4510 Unspecified right bundle-branch block: Secondary | ICD-10-CM | POA: Diagnosis not present

## 2022-02-20 DIAGNOSIS — E871 Hypo-osmolality and hyponatremia: Secondary | ICD-10-CM | POA: Diagnosis not present

## 2022-02-20 DIAGNOSIS — R350 Frequency of micturition: Secondary | ICD-10-CM | POA: Diagnosis not present

## 2022-02-20 DIAGNOSIS — I110 Hypertensive heart disease with heart failure: Secondary | ICD-10-CM | POA: Diagnosis not present

## 2022-02-20 DIAGNOSIS — I080 Rheumatic disorders of both mitral and aortic valves: Secondary | ICD-10-CM | POA: Diagnosis not present

## 2022-02-20 DIAGNOSIS — R32 Unspecified urinary incontinence: Secondary | ICD-10-CM | POA: Diagnosis not present

## 2022-02-20 DIAGNOSIS — E785 Hyperlipidemia, unspecified: Secondary | ICD-10-CM | POA: Diagnosis not present

## 2022-02-20 DIAGNOSIS — M1990 Unspecified osteoarthritis, unspecified site: Secondary | ICD-10-CM | POA: Diagnosis not present

## 2022-02-20 DIAGNOSIS — K219 Gastro-esophageal reflux disease without esophagitis: Secondary | ICD-10-CM | POA: Diagnosis not present

## 2022-02-20 DIAGNOSIS — Z6832 Body mass index (BMI) 32.0-32.9, adult: Secondary | ICD-10-CM | POA: Diagnosis not present

## 2022-02-20 DIAGNOSIS — D649 Anemia, unspecified: Secondary | ICD-10-CM | POA: Diagnosis not present

## 2022-02-20 DIAGNOSIS — E876 Hypokalemia: Secondary | ICD-10-CM | POA: Diagnosis not present

## 2022-02-20 DIAGNOSIS — E039 Hypothyroidism, unspecified: Secondary | ICD-10-CM | POA: Diagnosis not present

## 2022-02-21 DIAGNOSIS — M1990 Unspecified osteoarthritis, unspecified site: Secondary | ICD-10-CM | POA: Diagnosis not present

## 2022-02-21 DIAGNOSIS — R32 Unspecified urinary incontinence: Secondary | ICD-10-CM | POA: Diagnosis not present

## 2022-02-21 DIAGNOSIS — I080 Rheumatic disorders of both mitral and aortic valves: Secondary | ICD-10-CM | POA: Diagnosis not present

## 2022-02-21 DIAGNOSIS — K219 Gastro-esophageal reflux disease without esophagitis: Secondary | ICD-10-CM | POA: Diagnosis not present

## 2022-02-21 DIAGNOSIS — E871 Hypo-osmolality and hyponatremia: Secondary | ICD-10-CM | POA: Diagnosis not present

## 2022-02-21 DIAGNOSIS — L89322 Pressure ulcer of left buttock, stage 2: Secondary | ICD-10-CM | POA: Diagnosis not present

## 2022-02-21 DIAGNOSIS — Z6832 Body mass index (BMI) 32.0-32.9, adult: Secondary | ICD-10-CM | POA: Diagnosis not present

## 2022-02-21 DIAGNOSIS — R3915 Urgency of urination: Secondary | ICD-10-CM | POA: Diagnosis not present

## 2022-02-21 DIAGNOSIS — I4510 Unspecified right bundle-branch block: Secondary | ICD-10-CM | POA: Diagnosis not present

## 2022-02-21 DIAGNOSIS — J189 Pneumonia, unspecified organism: Secondary | ICD-10-CM | POA: Diagnosis not present

## 2022-02-21 DIAGNOSIS — I110 Hypertensive heart disease with heart failure: Secondary | ICD-10-CM | POA: Diagnosis not present

## 2022-02-21 DIAGNOSIS — I4891 Unspecified atrial fibrillation: Secondary | ICD-10-CM | POA: Diagnosis not present

## 2022-02-21 DIAGNOSIS — I5043 Acute on chronic combined systolic (congestive) and diastolic (congestive) heart failure: Secondary | ICD-10-CM | POA: Diagnosis not present

## 2022-02-21 DIAGNOSIS — E669 Obesity, unspecified: Secondary | ICD-10-CM | POA: Diagnosis not present

## 2022-02-21 DIAGNOSIS — E039 Hypothyroidism, unspecified: Secondary | ICD-10-CM | POA: Diagnosis not present

## 2022-02-21 DIAGNOSIS — R350 Frequency of micturition: Secondary | ICD-10-CM | POA: Diagnosis not present

## 2022-02-21 DIAGNOSIS — Z7901 Long term (current) use of anticoagulants: Secondary | ICD-10-CM | POA: Diagnosis not present

## 2022-02-21 DIAGNOSIS — E876 Hypokalemia: Secondary | ICD-10-CM | POA: Diagnosis not present

## 2022-02-21 DIAGNOSIS — E785 Hyperlipidemia, unspecified: Secondary | ICD-10-CM | POA: Diagnosis not present

## 2022-02-21 DIAGNOSIS — D649 Anemia, unspecified: Secondary | ICD-10-CM | POA: Diagnosis not present

## 2022-02-24 ENCOUNTER — Ambulatory Visit (HOSPITAL_BASED_OUTPATIENT_CLINIC_OR_DEPARTMENT_OTHER): Payer: Medicare HMO | Admitting: Family

## 2022-02-24 NOTE — Telephone Encounter (Signed)
RN returned call to patients daughter, who expresses great concern that it took a week to get a response. Daughter states that her father passed away on 01/09/2023. Daughter also states that they have labs with PCP tomorrow and a follow up with PCP in one week. They have been doing increased lasix dosing self regulated for a week or so and not seeing much improvement. Patient is scheduled for an appointment today at 3:10pm with Laurann Montana, NP. Routing this to [place] as an FYI for today's appointment.      "If she is still swelling then try increasing the lasix to 40 mg x3 days.  Check BMP and BNp this week.  If we have APP slot that would be great.   TR  "This can't be managed with MyChart messages.  Suggest at least a video visit if not an office assessment.   TCR "

## 2022-02-24 NOTE — Telephone Encounter (Signed)
Pt last seen 01/08/22 by Dr. Oval Linsey. Since that time there have been 10 phone encounters with various concerns such as swelling, shortness of breath, cough. Unfortunately her husband passed away last week so she understandably rescheduled her appointment today. Per last phone encounter her daughter had been self adjusting her Lasix. She is prescribed '20mg'$  daily.   Will ask RN to call patient to inquire:  What does of Lasix patient has taken last 3 days Is shortness of breath worse, the same, or better Is swelling bilateral? To the ankle or the calf? Any recent BP or weight readings?  Will ask RN to offer virtual visit with APP sooner than 6/8 if patient wishes  Barbara Dubonnet, NP

## 2022-02-24 NOTE — Telephone Encounter (Signed)
   Melissa called back, she r/s pt's appt because pt doesn't want to come in today due to pt's spouse passed away recently. She wanted to speak with Daphene Jaeger again regarding pt's medications

## 2022-02-24 NOTE — Addendum Note (Signed)
Addended by: Gerald Stabs on: 02/24/2022 03:56 PM   Modules accepted: Orders

## 2022-02-24 NOTE — Telephone Encounter (Signed)
RN returned call to patients daughter, who expresses great concern that it took a week to get a response. Daughter states that her father passed away on 2023/01/14. Daughter also states that they have labs with PCP tomorrow and a follow up with PCP in one week. They have been doing increased lasix dosing self regulated for a week or so and not seeing much improvement. Patient is scheduled for an appointment today at 3:10pm with Laurann Montana, NP. Routing this to [place] as an FYI for today's appointment.          "If she is still swelling then try increasing the lasix to 40 mg x3 days.  Check BMP and BNp this week.  If we have APP slot that would be great.   TR   "This can't be managed with MyChart messages.  Suggest at least a video visit if not an office assessment.   TCR "

## 2022-02-24 NOTE — Telephone Encounter (Signed)
RN returned call to [facility], the following recommendations. Labs tomorrow [facility].    Per Laurann Montana, NP ok to order BMP, BNP and fax to [facility] to get with patient labs tomorrow.   "Pt last seen 01/08/22 by Dr. Oval Linsey. Since that time there have been 10 phone encounters with various concerns such as swelling, shortness of breath, cough. Unfortunately her husband passed away last week so she understandably rescheduled her appointment today. Per last phone encounter her daughter had been self adjusting her Lasix. She is prescribed '20mg'$  daily.    Will ask RN to call patient to inquire:  What does of Lasix patient has taken last 3 days? Patient '40mg'$  QD  Is shortness of breath worse, the same, or better? Patient states better, but daughter says she can hear her wheezing in her sleep, patient still sleeping in her lift chair  Is swelling bilateral? To the ankle or the calf? Swelling in both legs, up to the calf  Any recent BP or weight readings? Weight  5/10-197 5/11-200.6- increased lasix to 40  5/12- 199- lasix '40mg'$  5/13-199.2- lasix '20mg'$    5/14-199.6- lasix '40mg'$   5/15-198.2- lasix '20mg'$   Last few days- 199-200- taking '40mg'$  lasix    Will ask RN to offer virtual visit with APP sooner than 6/8 if patient wishes   Loel Dubonnet, NP"    Labs ordered and faxed to [facility] and updates routed to Laurann Montana, NP

## 2022-02-24 NOTE — Telephone Encounter (Signed)
Noted. Patient has virtual visit tomorrow and concerns will be addressed at that time. Based on virtual visit will discuss lifestyle changes for heart failure, possible adjustment of diuretic (consider '40mg'$  x 3 days vs transition to torsemide)  Loel Dubonnet, NP

## 2022-02-25 ENCOUNTER — Ambulatory Visit (INDEPENDENT_AMBULATORY_CARE_PROVIDER_SITE_OTHER): Payer: Medicare HMO | Admitting: Family

## 2022-02-25 ENCOUNTER — Encounter (HOSPITAL_BASED_OUTPATIENT_CLINIC_OR_DEPARTMENT_OTHER): Payer: Self-pay | Admitting: Family

## 2022-02-25 ENCOUNTER — Telehealth (HOSPITAL_BASED_OUTPATIENT_CLINIC_OR_DEPARTMENT_OTHER): Payer: Self-pay

## 2022-02-25 VITALS — HR 60 | Wt 197.4 lb

## 2022-02-25 DIAGNOSIS — D6859 Other primary thrombophilia: Secondary | ICD-10-CM | POA: Diagnosis not present

## 2022-02-25 DIAGNOSIS — I480 Paroxysmal atrial fibrillation: Secondary | ICD-10-CM

## 2022-02-25 DIAGNOSIS — R600 Localized edema: Secondary | ICD-10-CM

## 2022-02-25 DIAGNOSIS — E7800 Pure hypercholesterolemia, unspecified: Secondary | ICD-10-CM | POA: Diagnosis not present

## 2022-02-25 DIAGNOSIS — E871 Hypo-osmolality and hyponatremia: Secondary | ICD-10-CM

## 2022-02-25 DIAGNOSIS — I5022 Chronic systolic (congestive) heart failure: Secondary | ICD-10-CM | POA: Diagnosis not present

## 2022-02-25 DIAGNOSIS — R0602 Shortness of breath: Secondary | ICD-10-CM | POA: Diagnosis not present

## 2022-02-25 DIAGNOSIS — R609 Edema, unspecified: Secondary | ICD-10-CM | POA: Diagnosis not present

## 2022-02-25 DIAGNOSIS — G9340 Encephalopathy, unspecified: Secondary | ICD-10-CM | POA: Diagnosis not present

## 2022-02-25 DIAGNOSIS — E039 Hypothyroidism, unspecified: Secondary | ICD-10-CM | POA: Diagnosis not present

## 2022-02-25 MED ORDER — FUROSEMIDE 20 MG PO TABS: 40.0000 mg | ORAL_TABLET | Freq: Every day | ORAL | 3 refills | Status: DC

## 2022-02-25 MED ORDER — SACUBITRIL-VALSARTAN 24-26 MG PO TABS: ORAL_TABLET | ORAL | 5 refills | Status: DC

## 2022-02-25 NOTE — Progress Notes (Signed)
Virtual Visit via Telephone Note   Because of [name]'s co-morbid illnesses, she is at least at moderate risk for complications without adequate follow up.  This format is felt to be most appropriate for this patient at this time.  The patient did not have access to video technology/had technical difficulties with video requiring transitioning to audio format only (telephone).  All issues noted in this document were discussed and addressed.  No physical exam could be performed with this format.  Please refer to the patient's chart for her consent to telehealth for [facility].    Date:  02/25/2022   ID:  [name], DOB 14-Feb-1931, MRN [id number] The patient was identified using 2 identifiers.  Patient Location: Home Provider Location: Office/Clinic   PCP:  Merrilee Seashore, MD   [facility] Providers Cardiologist:  None { Click to update primary MD,subspecialty MD or APP then REFRESH:1}    Evaluation Performed:  Follow-Up Visit  Chief Complaint:  Edema  History of Present Illness:    [name] is a 88 y.o. female with ***  Swelling in her ankles up to her mid calf. Feel tight and swollen. No change in color or temperature.   She has compression socks but does not wear regularly. Keeps her feet up regularly.   Weight in clinic visit 195  The last two nights Not getting up to go at night.   Drinks cup of coffee in the morning, water during the day. Drinking less than 2 L of fluid per day.   01/22/22 [facility] BNP 699.9, creatinine 1.06, GFR 51, Na 133.   Past Medical History:  Diagnosis Date   Dyspnea    diastolic dysfunction by echo 2012   GERD (gastroesophageal reflux disease)    Hypertension    Mild mitral and aortic regurgitation    Osteoarthritis    Right bundle branch block 2014   Thyroid disease    Past Surgical History:  Procedure Laterality Date   arm surgery     following an accident   CARDIOVERSION N/A 12/11/2021    Procedure: CARDIOVERSION;  Surgeon: Werner Lean, MD;  Location: [place];  Service: Cardiovascular;  Laterality: N/A;   KNEE SURGERY     Following an accident   TEE WITHOUT CARDIOVERSION N/A 12/11/2021   Procedure: TRANSESOPHAGEAL ECHOCARDIOGRAM (TEE);  Surgeon: Werner Lean, MD;  Location: [facility];  Service: Cardiovascular;  Laterality: N/A;   TONSILLECTOMY AND ADENOIDECTOMY     VAGINAL HYSTERECTOMY       Current Meds  Medication Sig   albuterol (VENTOLIN HFA) 108 (90 Base) MCG/ACT inhaler SMARTSIG:1 Puff(s) By Mouth Every 4 Hours PRN   amiodarone (PACERONE) 200 MG tablet Take 1 tablet (200 mg total) by mouth daily. Take 2tabs twice a day for 6days then 1 tab daily   apixaban (ELIQUIS) 5 MG TABS tablet Take 1 tablet (5 mg total) by mouth 2 (two) times daily.   benzonatate (TESSALON PERLES) 100 MG capsule Take 1 capsule (100 mg total) by mouth 3 (three) times daily as needed for cough.   furosemide (LASIX) 40 MG tablet Take 40 mg by mouth daily.   levothyroxine (SYNTHROID) 200 MCG tablet Take 1 tablet (200 mcg total) by mouth daily before breakfast.   LORazepam (ATIVAN) 0.5 MG tablet Take 1 tablet (0.5 mg total) by mouth daily as needed for anxiety or sleep.   lovastatin (MEVACOR) 40 MG tablet Take 1 tablet by mouth every evening.   metoprolol succinate (  TOPROL-XL) 50 MG 24 hr tablet Take 1 tablet (50 mg total) by mouth daily. Take with or immediately following a meal.   omeprazole (PRILOSEC) 10 MG capsule Take 10 mg by mouth every evening.   potassium chloride SA (KLOR-CON M) 20 MEQ tablet Take 1 tablet (20 mEq total) by mouth daily.   sacubitril-valsartan (ENTRESTO) 24-26 MG TAKE 1/2 TABLET BY MOUTH TWICE A DAY     Allergies:   Patient has no known allergies.   Social History   Tobacco Use   Smoking status: Never   Smokeless tobacco: Never  Vaping Use   Vaping Use: Never used  Substance Use Topics   Alcohol use: No   Drug use: No     Family  Hx: The patient's family history includes Heart attack in her mother.  ROS:   Please see the history of present illness.     All other systems reviewed and are negative.   Prior CV studies:   The following studies were reviewed today:  ***  Labs/Other Tests and Data Reviewed:    EKG:  No ECG reviewed.  Recent Labs: 12/08/2021: TSH 7.190 12/25/2021: ALT 96; Hemoglobin 11.6; Platelets 313 01/08/2022: Magnesium 1.8 01/22/2022: BNP 699.9; BUN 13; Creatinine, Ser 1.06; Potassium 4.9; Sodium 133   Recent Lipid Panel No results found for: CHOL, TRIG, HDL, CHOLHDL, LDLCALC, LDLDIRECT  Wt Readings from Last 3 Encounters:  02/25/22 197 lb 6.4 oz (89.5 kg)  01/08/22 195 lb 14.4 oz (88.9 kg)  12/30/21 195 lb (88.5 kg)     Risk Assessment/Calculations:    CHA2DS2-VASc Score = 5   This indicates a 7.2% annual risk of stroke. The patient's score is based upon: CHF History: 1 HTN History: 1 Diabetes History: 0 Stroke History: 0 Vascular Disease History: 0 Age Score: 2 Gender Score: 1     Objective:    Vital Signs:  Pulse 60   Wt 197 lb 6.4 oz (89.5 kg)   SpO2 96%   BMI 31.38 kg/m    VITAL SIGNS:  reviewed  ASSESSMENT & PLAN:    ***  {Are you ordering a CV Procedure (e.g. stress test, cath, DCCV, TEE, etc)?   Press F2        :[id number]}  Time:   Today, I have spent 25 minutes with the patient with telehealth technology discussing the above problems.    Medication Adjustments/Labs and Tests Ordered: Current medicines are reviewed at length with the patient today.  Concerns regarding medicines are outlined above.   Tests Ordered: No orders of the defined types were placed in this encounter.   Medication Changes: No orders of the defined types were placed in this encounter.   Follow Up:  In Person in 2 week(s)  Signed, Loel Dubonnet, NP  02/25/2022 4:33 PM    [facility]

## 2022-02-25 NOTE — Telephone Encounter (Signed)
RN called patients PCP office to see which labs were drawn today, no answer at the lab, left message asking the lab to call us back

## 2022-02-25 NOTE — Patient Instructions (Addendum)
Medication Instructions:  Your physician has recommended you make the following change in your medication:   INCREASE Entresto to one 24-'26mg'$  BID  CONTINUE Lasix '40mg'$  daily  *If you need a refill on your cardiac medications before your next appointment, please call your pharmacy*   Lab Work: None ordered today.   Testing/Procedures: None ordered today.    Follow-Up: At [facility], you and your health needs are our priority.  As part of our continuing mission to provide you with exceptional heart care, we have created designated Provider Care Teams.  These Care Teams include your primary Cardiologist (physician) and Advanced Practice Providers (APPs -  Physician Assistants and Nurse Practitioners) who all work together to provide you with the care you need, when you need it.  We recommend signing up for the patient portal called "MyChart".  Sign up information is provided on this After Visit Summary.  MyChart is used to connect with patients for Virtual Visits (Telemedicine).  Patients are able to view lab/test results, encounter notes, upcoming appointments, etc.  Non-urgent messages can be sent to your provider as well.   To learn more about what you can do with MyChart, go to [email or web address].    Your next appointment:   June 8th, 2023 with Loel Dubonnet, NP at 1:30 PM    Other Instructions  Heart Healthy Diet Recommendations: A low-salt diet is recommended. Meats should be grilled, baked, or boiled. Avoid fried foods. Focus on lean protein sources like fish or chicken with vegetables and fruits. The American Heart Association is a Microbiologist!  American Heart Association Diet and Lifeystyle Recommendations  Restrict to less than 2 Liters of fluid intake per day.  Exercise recommendations: The American Heart Association recommends 150 minutes of moderate intensity exercise weekly. Try 30 minutes of moderate intensity exercise 4-5 times per week. This could  include walking, jogging, or swimming.  To prevent or reduce lower extremity swelling: Eat a low salt diet. Salt makes the body hold onto extra fluid which causes swelling. Sit with legs elevated. For example, in the recliner or on an [place].  Wear knee-high compression stockings during the daytime. Ones labeled 15-20 mmHg provide good compression.

## 2022-02-26 NOTE — Telephone Encounter (Signed)
No answer to fax or message left, 2nd call attempt no answer, LM

## 2022-02-26 NOTE — Telephone Encounter (Signed)
[facility] lab returned call with the lab information- Patient had LP, TSH, CMP, UA, BNP    This is the number to call [phone] to request labs be faxed to [place], LM requesting labs be faxed to [place]

## 2022-02-27 ENCOUNTER — Encounter (HOSPITAL_BASED_OUTPATIENT_CLINIC_OR_DEPARTMENT_OTHER): Payer: Self-pay | Admitting: Family

## 2022-02-28 ENCOUNTER — Telehealth (HOSPITAL_BASED_OUTPATIENT_CLINIC_OR_DEPARTMENT_OTHER): Payer: Self-pay | Admitting: Family

## 2022-02-28 NOTE — Telephone Encounter (Signed)
  Labs via PCP 02/25/2022 ALT 22, AST 18, alkaline phosphatase 103 Creatinine 1.17, K4.6, NA 141, GFR 41 Total cholesterol 157, HDL 62, LDL 82, triglycerides 64 proBNP 4007 (normal less than 451) TSH 13.3, free T41.62 01/09/2022 WBC 5.7, hemoglobin 12, hematocrit 35.8, creatinine 1.15, GFR 42  Called Miss Goldberg to check in. Spoke with daughter. Tells me her weight has been coming down - weight today 196.8 lbs and yesterday 196.4 lbs. Her breathing has been fine. Swelling is better. Right leg calf still larger than the left leg. Blood pressure 135/73. PCP started Keflex for UTI and provided PRN Fluconazole for yeast infection.   Continue Lasix '40mg'$  QD. May take additional '20mg'$  PRN weight gain 2 lbs overnight or 5 lbs in one week. Continue Entresto 24-'26mg'$  BID.   Loel Dubonnet, NP

## 2022-03-04 ENCOUNTER — Encounter (HOSPITAL_BASED_OUTPATIENT_CLINIC_OR_DEPARTMENT_OTHER): Payer: Self-pay

## 2022-03-04 ENCOUNTER — Other Ambulatory Visit: Payer: Self-pay

## 2022-03-04 DIAGNOSIS — E039 Hypothyroidism, unspecified: Secondary | ICD-10-CM | POA: Diagnosis not present

## 2022-03-04 DIAGNOSIS — I10 Essential (primary) hypertension: Secondary | ICD-10-CM | POA: Diagnosis not present

## 2022-03-04 DIAGNOSIS — Z23 Encounter for immunization: Secondary | ICD-10-CM | POA: Diagnosis not present

## 2022-03-04 DIAGNOSIS — R7303 Prediabetes: Secondary | ICD-10-CM | POA: Diagnosis not present

## 2022-03-04 DIAGNOSIS — E871 Hypo-osmolality and hyponatremia: Secondary | ICD-10-CM | POA: Diagnosis not present

## 2022-03-04 MED ORDER — AMIODARONE HCL 200 MG PO TABS: 200.0000 mg | ORAL_TABLET | Freq: Every day | ORAL | 3 refills | Status: DC

## 2022-03-05 ENCOUNTER — Telehealth (HOSPITAL_BASED_OUTPATIENT_CLINIC_OR_DEPARTMENT_OTHER): Payer: Self-pay | Admitting: Cardiovascular Disease

## 2022-03-05 NOTE — Telephone Encounter (Signed)
Entresto dosage changed after appointment, with follow up call on 5/26, RN called and got no answer, left VM explaining.

## 2022-03-05 NOTE — Telephone Encounter (Signed)
*  STAT* If patient is at the pharmacy, call can be transferred to refill team.   1. Which medications need to be refilled? (please list name of each medication and dose if known)    2. Which pharmacy/location (including street and city if local pharmacy) is medication to be sent to? [place], [place] - [address]  3. Do they need a 30 day or 90 day supply? 90 with refills

## 2022-03-05 NOTE — Telephone Encounter (Signed)
[name]:  1. Name of Medication: sacubitril-valsartan (ENTRESTO) 24-26 MG  2. How are you currently taking this medication (dosage and times per day)? Patient taking one full tablet twice daily   3. Are you having a reaction (difficulty breathing--STAT)? no  4. What is your medication issue? Daughter of the patient states that the paperwork mailed to the patient following the virtual visit denotes that the patient should take one tablet daily but it was discussed during the virtual visit for the patient to take two full tablets daily. The change in dosage of the Furosemide was reflected but not this medication

## 2022-03-06 MED ORDER — AMIODARONE HCL 200 MG PO TABS: 200.0000 mg | ORAL_TABLET | Freq: Every day | ORAL | 2 refills | Status: DC

## 2022-03-06 NOTE — Telephone Encounter (Signed)
Rx(s) sent to pharmacy electronically.  

## 2022-03-13 ENCOUNTER — Encounter (HOSPITAL_BASED_OUTPATIENT_CLINIC_OR_DEPARTMENT_OTHER): Payer: Self-pay | Admitting: Family

## 2022-03-13 ENCOUNTER — Ambulatory Visit (HOSPITAL_BASED_OUTPATIENT_CLINIC_OR_DEPARTMENT_OTHER): Payer: Medicare HMO | Admitting: Family

## 2022-03-13 VITALS — BP 126/72 | HR 69 | Ht 65.0 in | Wt 199.6 lb

## 2022-03-13 DIAGNOSIS — I10 Essential (primary) hypertension: Secondary | ICD-10-CM | POA: Diagnosis not present

## 2022-03-13 DIAGNOSIS — I5022 Chronic systolic (congestive) heart failure: Secondary | ICD-10-CM

## 2022-03-13 DIAGNOSIS — E782 Mixed hyperlipidemia: Secondary | ICD-10-CM | POA: Diagnosis not present

## 2022-03-13 DIAGNOSIS — D6859 Other primary thrombophilia: Secondary | ICD-10-CM | POA: Diagnosis not present

## 2022-03-13 DIAGNOSIS — I480 Paroxysmal atrial fibrillation: Secondary | ICD-10-CM | POA: Diagnosis not present

## 2022-03-13 MED ORDER — AMIODARONE HCL 200 MG PO TABS: 200.0000 mg | ORAL_TABLET | Freq: Every day | ORAL | 2 refills | Status: DC

## 2022-03-13 MED ORDER — FUROSEMIDE 20 MG PO TABS: ORAL_TABLET | ORAL | 3 refills | Status: DC

## 2022-03-13 NOTE — Progress Notes (Signed)
Office Visit    Patient Name: [name] Date of Encounter: 03/13/2022  PCP:  Merrilee Seashore, Kay  Cardiologist:  [name], MD  Advanced Practice Provider:  No care team member to display Electrophysiologist:  None      Chief Complaint    [name] is a 88 y.o. female with a hx of hypertension, hyperlipidemia, RBBB, paroxysmal atrial fibrillation, chronic systolic and diastolic heart failure.   presents today for follow-up for lower extremity edema  Past Medical History    Past Medical History:  Diagnosis Date   Dyspnea    diastolic dysfunction by echo 2012   GERD (gastroesophageal reflux disease)    Hypertension    Mild mitral and aortic regurgitation    Osteoarthritis    Right bundle branch block 2014   Thyroid disease    Past Surgical History:  Procedure Laterality Date   arm surgery     following an accident   CARDIOVERSION N/A 12/11/2021   Procedure: CARDIOVERSION;  Surgeon: Werner Lean, MD;  Location: [facility];  Service: Cardiovascular;  Laterality: N/A;   KNEE SURGERY     Following an accident   TEE WITHOUT CARDIOVERSION N/A 12/11/2021   Procedure: TRANSESOPHAGEAL ECHOCARDIOGRAM (TEE);  Surgeon: Werner Lean, MD;  Location: [facility];  Service: Cardiovascular;  Laterality: N/A;   TONSILLECTOMY AND ADENOIDECTOMY     VAGINAL HYSTERECTOMY      Allergies  Allergies  Allergen Reactions   Fluoxetine     Other reaction(s): hyponatremia   Serotonin Reuptake Inhibitors (Ssris)     Other reaction(s): hyponatremia    History of Present Illness    [name] is a 88 y.o. female with a hx of hypertension, hyperlipidemia, RBBB, paroxysmal atrial fibrillation, chronic systolic and diastolic heart failure. Last seen 02/25/2022 via virtual visit.  Seen in the ED 12/08/2021 for new onset atrial fibrillation after presenting with shortness of breath and volume overload.  BNP  elevated 453.  Echo with LVEF 45% with moderate MR.  She was diuresed 12 L.  Underwent TEE/DCCV with a quick return to atrial fibrillation with plan for rate control.  Fluoxetine discontinued in the hospital due to hyponatremia and Synthroid dose adjusted.   Last seen 01/08/2022 by Dr. Oval Linsey.  She noted volume overload and her dose of Lasix was increased to 40 mg daily for 2 days. Entresto was reduced to half tablet twice daily to prevent hypotension.  Since that time there have been multiple phone conversations due to concern for shortness of breath, edema, cough.  For cough she was encouraged to discuss with her PCP and provided short course of Tessalon Perles.  Her Lasix dose has been intermittently adjusted.  Seen via phone visit 02/25/22 recommended to increase Entresto to 24-26 mg twice daily and continue Lasix 40 mg daily.  Labs at PCP 02/25/2022 revealed creatinine 1.17, GFR 41, proBNP 4007, normal liver enzymes.  Of note visit was virtual due to recent passing of her husband and condolences offered.  Presents today for follow-up with her daughter.  Still with occasional cough which improved some with Mucinex and expectorant such as Delsym or Tessalon.  Had to reschedule her appointment with pulmonology but plans to reschedule.  Does note coughing spells have decreased since improvement in volume status.  She does note occasional itching that radiates to different sites with no noted rash and encouraged to trial Zyrtec.  Reports no shortness of breath at rest  or exertional dyspnea stable at baseline.  Her lower extremity edema has been improved and she only has trace edema in her feet at this time.  She reports no lightheadedness suggestive of hypotension.  No orthopnea, PND EKGs/Labs/Other Studies Reviewed:   The following studies were reviewed today:  ECHO 12/09/2021:   1. Left ventricular ejection fraction, by estimation, is 45%. The left  ventricle has mildly decreased function. The left  ventricle demonstrates global hypokinesis. Left ventricular diastolic parameters are indeterminate.   2. Right ventricular systolic function is mildly reduced. The right  ventricular size is normal. There is mildly elevated pulmonary artery systolic pressure. The estimated right ventricular systolic pressure is 81.8 mmHg.   3. Left atrial size was mildly dilated.   4. The mitral valve is abnormal. Moderate mitral valve regurgitation. No evidence of mitral stenosis.   5. The aortic valve is tricuspid. There is moderate calcification of the aortic valve. Aortic valve regurgitation is mild. Aortic valve  sclerosis/calcification is present, without any evidence of aortic  stenosis.   6. Aortic dilatation noted. There is mild dilatation of the ascending  aorta, measuring 40 mm.   7. The inferior vena cava is dilated in size with <50% respiratory  variability, suggesting right atrial pressure of 15 mmHg.   8. There was a left pleural effusion.   9. The patient was in atrial fibrillation.    ECHO TEE 12/11/2021:   1. Left ventricular ejection fraction, by estimation, is 45 to 50%. Left ventricular ejection fraction by 3D volume is 46 %. The left ventricle has mildly decreased function. The left ventricle demonstrates global hypokinesis. Left ventricular diastolic   function could not be evaluated.   2. Right ventricular systolic function is normal. The right ventricular size is normal.   3. Left atrial size was mildly dilated. No left atrial/left atrial  appendage thrombus was detected.   4. Large pleural effusion in the left lateral region.   5. The mitral valve is grossly normal. Moderate mitral valve  regurgitation. No evidence of mitral stenosis.   6. Tricuspid valve regurgitation is mild to moderate.   7. The aortic valve is tricuspid. Aortic valve regurgitation is mild to  moderate. No aortic stenosis is present. Aortic valve mean gradient  measures 3.0 mmHg.   8. There is mild (Grade II)  plaque involving the descending aorta.   EKG:  EKG performed today reveals rate controlled atrial fibrillation 78bpm with RBBB.   Recent Labs: 12/08/2021: TSH 7.190 12/25/2021: ALT 96; Hemoglobin 11.6; Platelets 313 01/08/2022: Magnesium 1.8 01/22/2022: BNP 699.9; BUN 13; Creatinine, Ser 1.06; Potassium 4.9; Sodium 133  Recent Lipid Panel No results found for: "CHOL", "TRIG", "HDL", "CHOLHDL", "VLDL", "LDLCALC", "LDLDIRECT"  Risk Assessment/Calculations:   CHA2DS2-VASc Score = 5   This indicates a 7.2% annual risk of stroke. The patient's score is based upon: CHF History: 1 HTN History: 1 Diabetes History: 0 Stroke History: 0 Vascular Disease History: 0 Age Score: 2 Gender Score: 1    Home Medications   Current Meds  Medication Sig   albuterol (VENTOLIN HFA) 108 (90 Base) MCG/ACT inhaler SMARTSIG:1 Puff(s) By Mouth Every 4 Hours PRN   amiodarone (PACERONE) 200 MG tablet Take 1 tablet (200 mg total) by mouth daily. Take 2tabs twice a day for 6days then 1 tab daily   apixaban (ELIQUIS) 5 MG TABS tablet Take 1 tablet (5 mg total) by mouth 2 (two) times daily.   benzonatate (TESSALON PERLES) 100 MG capsule Take 1 capsule (100  mg total) by mouth 3 (three) times daily as needed for cough.   furosemide (LASIX) 20 MG tablet Take 2 tablets (40 mg total) by mouth daily.   levothyroxine (SYNTHROID) 200 MCG tablet Take 1 tablet (200 mcg total) by mouth daily before breakfast.   LORazepam (ATIVAN) 0.5 MG tablet Take 1 tablet (0.5 mg total) by mouth daily as needed for anxiety or sleep.   lovastatin (MEVACOR) 40 MG tablet Take 1 tablet by mouth every evening.   metoprolol succinate (TOPROL-XL) 50 MG 24 hr tablet Take 1 tablet (50 mg total) by mouth daily. Take with or immediately following a meal.   omeprazole (PRILOSEC) 10 MG capsule Take 10 mg by mouth every evening.   potassium chloride SA (KLOR-CON M) 20 MEQ tablet Take 1 tablet (20 mEq total) by mouth daily.   sacubitril-valsartan  (ENTRESTO) 24-26 MG TAKE 1/2 TABLET BY MOUTH TWICE A DAY     Review of Systems    All other systems reviewed and are otherwise negative except as noted above.  Physical Exam    VS:  BP 126/72   Pulse 69   Ht '5\' 5"'$  (1.651 m)   Wt 199 lb 9.6 oz (90.5 kg)   BMI 33.22 kg/m  , BMI Body mass index is 33.22 kg/m.  Wt Readings from Last 3 Encounters:  03/13/22 199 lb 9.6 oz (90.5 kg)  02/25/22 197 lb 6.4 oz (89.5 kg)  01/08/22 195 lb 14.4 oz (88.9 kg)     GEN: Well nourished, well developed, in no acute distress. HEENT: normal. Neck: Supple, no JVD, carotid bruits, or masses. Cardiac: RRR, no murmurs, rubs, or gallops. No clubbing, cyanosis, edema.  Radials/PT 2+ and equal bilaterally.  Respiratory:  Respirations regular and unlabored, clear to auscultation bilaterally. GI: Soft, nontender, nondistended. MS: No deformity or atrophy. Skin: Warm and dry, no rash. Neuro:  Strength and sensation are intact. Psych: Normal affect.  Assessment & Plan    HFrEF - LVEF 45%.  Volume status improved with Entresto 24-26 mg twice daily.  Continue Lasix 40 mg daily with additional 20 mg as needed for weight gain of 2 pounds overnight or 5 pounds in 1 week.   Low sodium diet, fluid restriction <2L, and daily weights encouraged. Educated to contact our office for weight gain of 2 lbs overnight or 5 lbs in one week.  Prefers not to add additional medication at this time but spironolactone could be considered in the future.  Did have recent UTI with significant confusion so would hesitate to use SGLT2.    Hyponatremia -Restrict to <2L fluid intake.  Resolved by most recent labs 02/25/2022 available under media.  HLD - Continue Lovastatin per PCP.   Hypothyroidism - Continue to follow with PCP.   PAF / Hypercoagulabe state - Amiodarone initiated due to quick return of atrial fib after cardoiversion. Consider repeat DCCV at follow up.  She and her daughter would prefer not to schedule today and wait  until she has continued to recover from her recent volume overload and due to the recent loss of her husband.  CHA2DS2-VASc Score = 5 [CHF History: 1, HTN History: 1, Diabetes History: 0, Stroke History: 0, Vascular Disease History: 0, Age Score: 2, Gender Score: 1].  Therefore, the patient's annual risk of stroke is 7.2 %.    Continue ELiquis '5mg'$  BID, denies bleeding complications.   HTN - Goal <130/80. BP well controlled. Continue current antihypertensive regimen.    Disposition: Follow up  in 4  to 6 weeks  with [name], MD or APP.  Signed, Loel Dubonnet, NP 03/13/2022, 1:46 PM [facility]

## 2022-03-13 NOTE — Patient Instructions (Addendum)
Medication Instructions:  Your physician has recommended you make the following change in your medication:   CONTINUE Furosemide (Lasix) '40mg'$  daily  May take additional tablet as needed for weight gain of 2 pounds overnight or 5 pounds in one week  ADD Zyrtec one '10mg'$  tablet daily for itching   *If you need a refill on your cardiac medications before your next appointment, please call your pharmacy*  Lab Work: None ordered today.   Testing/Procedures: None ordered today.   Follow-Up: At [facility], you and your health needs are our priority.  As part of our continuing mission to provide you with exceptional heart care, we have created designated Provider Care Teams.  These Care Teams include your primary Cardiologist (physician) and Advanced Practice Providers (APPs -  Physician Assistants and Nurse Practitioners) who all work together to provide you with the care you need, when you need it.  We recommend signing up for the patient portal called "MyChart".  Sign up information is provided on this After Visit Summary.  MyChart is used to connect with patients for Virtual Visits (Telemedicine).  Patients are able to view lab/test results, encounter notes, upcoming appointments, etc.  Non-urgent messages can be sent to your provider as well.   To learn more about what you can do with MyChart, go to [email or web address].    Your next appointment:   4-6 week(s)  The format for your next appointment:   In Person  Provider:   [name], MD or Loel Dubonnet, NP     Other Instructions  To prevent or reduce lower extremity swelling: Eat a low salt diet. Salt makes the body hold onto extra fluid which causes swelling. Sit with legs elevated. For example, in the recliner or on an [place].  Wear knee-high compression stockings during the daytime. Ones labeled 15-20 mmHg provide good compression.   Heart Healthy Diet Recommendations: A low-salt diet is recommended. Meats  should be grilled, baked, or boiled. Avoid fried foods. Focus on lean protein sources like fish or chicken with vegetables and fruits. The American Heart Association is a Microbiologist!  American Heart Association Diet and Lifeystyle Recommendations   Exercise recommendations: The American Heart Association recommends 150 minutes of moderate intensity exercise weekly. Try 30 minutes of moderate intensity exercise 4-5 times per week. This could include walking, jogging, or swimming.

## 2022-03-14 ENCOUNTER — Encounter (HOSPITAL_BASED_OUTPATIENT_CLINIC_OR_DEPARTMENT_OTHER): Payer: Self-pay | Admitting: Family

## 2022-04-02 ENCOUNTER — Other Ambulatory Visit: Payer: Self-pay | Admitting: Cardiology

## 2022-04-02 ENCOUNTER — Telehealth: Payer: Self-pay | Admitting: Cardiology

## 2022-04-02 MED ORDER — SACUBITRIL-VALSARTAN 24-26 MG PO TABS: ORAL_TABLET | ORAL | 5 refills | Status: DC

## 2022-04-02 NOTE — Telephone Encounter (Signed)
Pt's daughter called and they were completely out of entresto.  Reassured they could miss one dose.  I did send new script in for whole pill BID.  Triage could you make sure they were able to obtain the entresto?  Thanks Aneth Schlagel

## 2022-04-03 MED ORDER — SACUBITRIL-VALSARTAN 24-26 MG PO TABS: ORAL_TABLET | ORAL | 5 refills | Status: DC

## 2022-04-03 NOTE — Telephone Encounter (Signed)
Rx was done "no print" sent new x to [facility].

## 2022-04-07 ENCOUNTER — Telehealth (HOSPITAL_BASED_OUTPATIENT_CLINIC_OR_DEPARTMENT_OTHER): Payer: Self-pay | Admitting: Cardiovascular Disease

## 2022-04-07 NOTE — Telephone Encounter (Signed)
Pt c/o Shortness Of Breath: STAT if SOB developed within the last 24 hours or pt is noticeably SOB on the phone  1. Are you currently SOB (can you hear that pt is SOB on the phone)?  Slightly  2. How long have you been experiencing SOB?  Started about 8am  3. Are you SOB when sitting or when up moving around?   Both   4. Are you currently experiencing any other symptoms?   No   Daughter stated that she gave her mother an albuterol treatment which usually takes the SOB away but this did not work today.  Daughter notes mother has been anxious due to recent loss of husband.

## 2022-04-07 NOTE — Telephone Encounter (Signed)
RN returned call to patient's daughter, She endorses shortness of breath that start this morning and has given her the Albertuol inhaler, but has not gotten any better. Patient states that it has not gotten any worse.   BP  119/65. Weight up less than a pound from yesterday.    Advised patient's daughter that since she is still short of breath the patient needs to be evaluated in the ED.   Patient is scheduled to be seen next week with Laurann Montana, NP

## 2022-04-17 ENCOUNTER — Ambulatory Visit (HOSPITAL_BASED_OUTPATIENT_CLINIC_OR_DEPARTMENT_OTHER): Payer: Medicare HMO | Admitting: Family

## 2022-04-17 ENCOUNTER — Encounter (HOSPITAL_BASED_OUTPATIENT_CLINIC_OR_DEPARTMENT_OTHER): Payer: Self-pay | Admitting: Family

## 2022-04-17 VITALS — BP 110/54 | HR 71 | Ht 65.0 in | Wt 198.0 lb

## 2022-04-17 DIAGNOSIS — D6859 Other primary thrombophilia: Secondary | ICD-10-CM | POA: Diagnosis not present

## 2022-04-17 DIAGNOSIS — I4819 Other persistent atrial fibrillation: Secondary | ICD-10-CM | POA: Diagnosis not present

## 2022-04-17 DIAGNOSIS — E782 Mixed hyperlipidemia: Secondary | ICD-10-CM | POA: Diagnosis not present

## 2022-04-17 DIAGNOSIS — I5022 Chronic systolic (congestive) heart failure: Secondary | ICD-10-CM

## 2022-04-17 DIAGNOSIS — I10 Essential (primary) hypertension: Secondary | ICD-10-CM

## 2022-04-17 NOTE — Progress Notes (Signed)
Office Visit    Patient Name: [name] Date of Encounter: 04/17/2022  PCP:  Merrilee Seashore, [facility]  Cardiologist:  [name], MD  Advanced Practice Provider:  No care team member to display Electrophysiologist:  None      Chief Complaint    [name] is a 88 y.o. female with a hx of hypertension, hyperlipidemia, RBBB, paroxysmal atrial fibrillation, chronic systolic and diastolic heart failure. Presents today for follow-up for atrial fibrillation.   Past Medical History    Past Medical History:  Diagnosis Date   Dyspnea    diastolic dysfunction by echo 2012   GERD (gastroesophageal reflux disease)    Hypertension    Mild mitral and aortic regurgitation    Osteoarthritis    Right bundle branch block 2014   Thyroid disease    Past Surgical History:  Procedure Laterality Date   arm surgery     following an accident   CARDIOVERSION N/A 12/11/2021   Procedure: CARDIOVERSION;  Surgeon: Werner Lean, MD;  Location: [facility];  Service: Cardiovascular;  Laterality: N/A;   KNEE SURGERY     Following an accident   TEE WITHOUT CARDIOVERSION N/A 12/11/2021   Procedure: TRANSESOPHAGEAL ECHOCARDIOGRAM (TEE);  Surgeon: Werner Lean, MD;  Location: [facility];  Service: Cardiovascular;  Laterality: N/A;   TONSILLECTOMY AND ADENOIDECTOMY     VAGINAL HYSTERECTOMY      Allergies  Allergies  Allergen Reactions   Fluoxetine     Other reaction(s): hyponatremia   Serotonin Reuptake Inhibitors (Ssris)     Other reaction(s): hyponatremia    History of Present Illness    Melisssa CLYDA [name] is a 88 y.o. female with a hx of hypertension, hyperlipidemia, RBBB, paroxysmal atrial fibrillation, chronic systolic and diastolic heart failure. Last seen 03/13/22.   Seen in the ED 12/08/2021 for new onset atrial fibrillation after presenting with shortness of breath and volume overload.  BNP elevated 453.  Echo  with LVEF 45% with moderate MR.  She was diuresed 12 L.  Underwent TEE/DCCV with a quick return to atrial fibrillation with plan for rate control.  Fluoxetine discontinued in the hospital due to hyponatremia and Synthroid dose adjusted.   Last seen 01/08/2022 by Dr. Oval Linsey.  She noted volume overload and her dose of Lasix was increased to 40 mg daily for 2 days. Entresto was reduced to half tablet twice daily to prevent hypotension.  Since that time there have been multiple phone conversations due to concern for shortness of breath, edema, cough.  For cough she was encouraged to discuss with her PCP and provided short course of Tessalon Perles.  Her Lasix dose has been intermittently adjusted.  Seen via phone visit 02/25/22 recommended to increase Entresto to 24-26 mg twice daily and continue Lasix 40 mg daily.  Labs at PCP 02/25/2022 revealed creatinine 1.17, GFR 41, proBNP 4007, normal liver enzymes.  Of note visit was virtual due to recent passing of her husband and condolences offered.  At follow up 03/13/22 still with some dyspnea, cough. Did not feel she was strong enough for cardioversion and wished to wait.   Presents today for follow up and to discuss DCCV. Cough, dyspnea improved. Notes shortness of breath first thing in the morning which improves after her albuterol. Has not yet gotten to see pulmonology (had to be rescheduled consult/PFT due to her husband's passing). Denies chest pain, pressure, tightness. Still with fatigue which we discussed could be  related to her atrial fibrillation.  EKGs/Labs/Other Studies Reviewed:   The following studies were reviewed today:  ECHO 12/09/2021:   1. Left ventricular ejection fraction, by estimation, is 45%. The left  ventricle has mildly decreased function. The left ventricle demonstrates global hypokinesis. Left ventricular diastolic parameters are indeterminate.   2. Right ventricular systolic function is mildly reduced. The right  ventricular  size is normal. There is mildly elevated pulmonary artery systolic pressure. The estimated right ventricular systolic pressure is 29.2 mmHg.   3. Left atrial size was mildly dilated.   4. The mitral valve is abnormal. Moderate mitral valve regurgitation. No evidence of mitral stenosis.   5. The aortic valve is tricuspid. There is moderate calcification of the aortic valve. Aortic valve regurgitation is mild. Aortic valve  sclerosis/calcification is present, without any evidence of aortic  stenosis.   6. Aortic dilatation noted. There is mild dilatation of the ascending  aorta, measuring 40 mm.   7. The inferior vena cava is dilated in size with <50% respiratory  variability, suggesting right atrial pressure of 15 mmHg.   8. There was a left pleural effusion.   9. The patient was in atrial fibrillation.    ECHO TEE 12/11/2021:   1. Left ventricular ejection fraction, by estimation, is 45 to 50%. Left ventricular ejection fraction by 3D volume is 46 %. The left ventricle has mildly decreased function. The left ventricle demonstrates global hypokinesis. Left ventricular diastolic   function could not be evaluated.   2. Right ventricular systolic function is normal. The right ventricular size is normal.   3. Left atrial size was mildly dilated. No left atrial/left atrial  appendage thrombus was detected.   4. Large pleural effusion in the left lateral region.   5. The mitral valve is grossly normal. Moderate mitral valve  regurgitation. No evidence of mitral stenosis.   6. Tricuspid valve regurgitation is mild to moderate.   7. The aortic valve is tricuspid. Aortic valve regurgitation is mild to  moderate. No aortic stenosis is present. Aortic valve mean gradient  measures 3.0 mmHg.   8. There is mild (Grade II) plaque involving the descending aorta.   EKG:  EKG performed today reveals rate controlled atrial fibrillation 66bpm with RBBB.   Recent Labs: 12/08/2021: TSH 7.190 12/25/2021: ALT  96; Hemoglobin 11.6; Platelets 313 01/08/2022: Magnesium 1.8 01/22/2022: BNP 699.9; BUN 13; Creatinine, Ser 1.06; Potassium 4.9; Sodium 133  Recent Lipid Panel No results found for: "CHOL", "TRIG", "HDL", "CHOLHDL", "VLDL", "LDLCALC", "LDLDIRECT"  Risk Assessment/Calculations:   CHA2DS2-VASc Score = 5   This indicates a 7.2% annual risk of stroke. The patient's score is based upon: CHF History: 1 HTN History: 1 Diabetes History: 0 Stroke History: 0 Vascular Disease History: 0 Age Score: 2 Gender Score: 1    Home Medications   Current Meds  Medication Sig   albuterol (VENTOLIN HFA) 108 (90 Base) MCG/ACT inhaler SMARTSIG:1 Puff(s) By Mouth Every 4 Hours PRN   amiodarone (PACERONE) 200 MG tablet Take 1 tablet (200 mg total) by mouth daily.   apixaban (ELIQUIS) 5 MG TABS tablet Take 1 tablet (5 mg total) by mouth 2 (two) times daily.   benzonatate (TESSALON PERLES) 100 MG capsule Take 1 capsule (100 mg total) by mouth 3 (three) times daily as needed for cough.   furosemide (LASIX) 20 MG tablet Take '40mg'$  (2 tablets) daily. May take additional '20mg'$  (1 tablet) as needed for weight gain of 2 pounds overnight or 5 pounds in  one week   levothyroxine (SYNTHROID) 200 MCG tablet Take 1 tablet (200 mcg total) by mouth daily before breakfast.   LORazepam (ATIVAN) 0.5 MG tablet Take 1 tablet (0.5 mg total) by mouth daily as needed for anxiety or sleep.   lovastatin (MEVACOR) 40 MG tablet Take 1 tablet by mouth every evening.   metoprolol succinate (TOPROL-XL) 50 MG 24 hr tablet Take 1 tablet (50 mg total) by mouth daily. Take with or immediately following a meal.   omeprazole (PRILOSEC) 10 MG capsule Take 10 mg by mouth every evening.   potassium chloride SA (KLOR-CON M) 20 MEQ tablet Take 1 tablet (20 mEq total) by mouth daily.   sacubitril-valsartan (ENTRESTO) 24-26 MG TAKE 1 TABLET BY MOUTH TWICE A DAY     Review of Systems    All other systems reviewed and are otherwise negative except as  noted above.  Physical Exam    VS:  BP (!) 110/54   Pulse 71   Ht '5\' 5"'$  (1.651 m)   Wt 198 lb (89.8 kg)   BMI 32.95 kg/m  , BMI Body mass index is 32.95 kg/m.  Wt Readings from Last 3 Encounters:  04/17/22 198 lb (89.8 kg)  03/13/22 199 lb 9.6 oz (90.5 kg)  02/25/22 197 lb 6.4 oz (89.5 kg)    GEN: Well nourished, well developed, in no acute distress. HEENT: normal. Neck: Supple, no JVD, carotid bruits, or masses. Cardiac: IRIR, no murmurs, rubs, or gallops. No clubbing, cyanosis, edema.  Radials/PT 2+ and equal bilaterally.  Respiratory:  Respirations regular and unlabored, clear to auscultation bilaterally. GI: Soft, nontender, nondistended. MS: No deformity or atrophy. Skin: Warm and dry, no rash. Neuro:  Strength and sensation are intact. Psych: Normal affect.  Assessment & Plan    HFrEF - LVEF 45%. Continue Entresto, Lasix.  Low sodium diet, fluid restriction <2L, and daily weights encouraged. Educated to contact our office for weight gain of 2 lbs overnight or 5 lbs in one week.  Prefers not to add additional medication at this time but spironolactone could be considered in the future.  Did have recent UTI with significant confusion so would hesitate to use SGLT2.    HLD - Continue Lovastatin per PCP.   Hypothyroidism - Continue to follow with PCP.   PAF / Hypercoagulabe state - Amiodarone initiated due to quick return of atrial fib after cardoiversion 12/2021.CHA2DS2-VASc Score = 5 [CHF History: 1, HTN History: 1, Diabetes History: 0, Stroke History: 0, Vascular Disease History: 0, Age Score: 2, Gender Score: 1].  Therefore, the patient's annual risk of stroke is 7.2 %.    Continue ELiquis '5mg'$  BID, denies bleeding complications. PLan for cardioversion. No signs of amiodarone toxicity.   Shared Decision Making/Informed Consent The risks (stroke, cardiac arrhythmias rarely resulting in the need for a temporary or permanent pacemaker, skin irritation or burns and  complications associated with conscious sedation including aspiration, arrhythmia, respiratory failure and death), benefits (restoration of normal sinus rhythm) and alternatives of a direct current cardioversion were explained in detail to Ms. Monje and she agrees to proceed.     HTN - Goal <130/80. BP well controlled. Continue current antihypertensive regimen.    Disposition: Follow up virtual visit one week prior to cardioversion with [name], MD or APP.  This will allow for HPI within 30 days of cardioversion. They prefer to wait til August due to scheduling conflicts.   Signed, Loel Dubonnet, NP 04/17/2022, 3:15 PM [facility]

## 2022-04-17 NOTE — Patient Instructions (Addendum)
Medication Instructions:  Continue your current medications.   Make sure to not miss any doses of Eliquis.   Loel Dubonnet, NPl will reach out to pharmacy team about your vitamin E.   *If you need a refill on your cardiac medications before your next appointment, please call your pharmacy*   Lab Work: Your physician recommends that you return for lab work one week prior to cardioversion August 10th: BMP, CBC  If you have labs (blood work) drawn today and your tests are completely normal, you will receive your results only by: [place] (if you have MyChart) OR A paper copy in the mail If you have any lab test that is abnormal or we need to change your treatment, we will call you to review the results.   Testing/Procedures: Your physician has recommended that you have a Cardioversion (DCCV). Electrical Cardioversion uses a jolt of electricity to your heart either through paddles or wired patches attached to your chest. This is a controlled, usually prescheduled, procedure. Defibrillation is done under light anesthesia in the hospital, and you usually go home the day of the procedure. This is done to get your heart back into a normal rhythm. You are not awake for the procedure. Please see the instruction sheet given to you today.   Follow-Up: At [facility], you and your health needs are our priority.  As part of our continuing mission to provide you with exceptional heart care, we have created designated Provider Care Teams.  These Care Teams include your primary Cardiologist (physician) and Advanced Practice Providers (APPs -  Physician Assistants and Nurse Practitioners) who all work together to provide you with the care you need, when you need it.  We recommend signing up for the patient portal called "MyChart".  Sign up information is provided on this After Visit Summary.  MyChart is used to connect with patients for Virtual Visits (Telemedicine).  Patients are able to view  lab/test results, encounter notes, upcoming appointments, etc.  Non-urgent messages can be sent to your provider as well.   To learn more about what you can do with MyChart, go to [email or web address].    Your next appointment:    Telephone visit one week prior to cardioversion with Loel Dubonnet, NP  AND  2-3 weeks after cardioversion with Dr. Oval Linsey or Loel Dubonnet, NP   Other Instructions You are scheduled for a Cardioversion on August 17th  with Dr. Oval Linsey.  Please arrive at the [facility] (Main Entrance A) at [facility]: [address] [place], [place] [address] at 10:30 am.  DIET: Nothing to eat or drink after midnight except a sip of water with medications (see medication instructions below)  FYI: For your safety, and to allow [place] to monitor your vital signs accurately during the surgery/procedure we request that   if you have artificial nails, gel coating, SNS etc. Please have those removed prior to your surgery/procedure. Not having the nail coverings /polish removed may result in cancellation or delay of your surgery/procedure.   Medication Instructions: Hold lasix morning of the procedure  Continue your anticoagulant: ELIQUIS  You will need to continue your anticoagulant after your procedure until you  are told by your  Provider that it is safe to stop   Labs: August 10th   You must have a responsible person to drive you home and stay in the waiting area during your procedure. Failure to do so could result in cancellation.  Bring your insurance cards.  *  Special Note: Every effort is made to have your procedure done on time. Occasionally there are emergencies that occur at the hospital that may cause delays. Please be patient if a delay does occur.

## 2022-04-18 ENCOUNTER — Encounter (HOSPITAL_BASED_OUTPATIENT_CLINIC_OR_DEPARTMENT_OTHER): Payer: Self-pay | Admitting: Family

## 2022-05-14 ENCOUNTER — Encounter (HOSPITAL_BASED_OUTPATIENT_CLINIC_OR_DEPARTMENT_OTHER): Payer: Self-pay | Admitting: Family

## 2022-05-14 ENCOUNTER — Ambulatory Visit (INDEPENDENT_AMBULATORY_CARE_PROVIDER_SITE_OTHER): Payer: Medicare HMO | Admitting: Family

## 2022-05-14 VITALS — BP 112/65 | HR 66 | Ht 65.0 in | Wt 198.4 lb

## 2022-05-14 DIAGNOSIS — I4819 Other persistent atrial fibrillation: Secondary | ICD-10-CM

## 2022-05-14 DIAGNOSIS — D6859 Other primary thrombophilia: Secondary | ICD-10-CM | POA: Diagnosis not present

## 2022-05-14 NOTE — H&P (View-Only) (Signed)
Virtual Visit via Telephone Note   Because of [name]'s co-morbid illnesses, she is at least at moderate risk for complications without adequate follow up.  This format is felt to be most appropriate for this patient at this time.  The patient did not have access to video technology/had technical difficulties with video requiring transitioning to audio format only (telephone).  All issues noted in this document were discussed and addressed.  No physical exam could be performed with this format.  Please refer to the patient's chart for her consent to telehealth for [facility].   Date:  05/14/2022   ID:  [name], DOB 06/23/1931, MRN [id number] The patient was identified using 2 identifiers.  Patient Location: Home Provider Location: Office/Clinic  PCP:  Merrilee Seashore, [place] Providers Cardiologist:  [name], MD     Evaluation Performed:  Follow-Up Visit  Chief Complaint:  Discuss cardioversion  History of Present Illness:    [name] is a 88 y.o. female with hypertension, hyperlipidemia, RBBB, paroxysmal atrial fibrillation, chronic systolic and diastolic heart failure. Last seen 04/17/22.    Seen in the ED 12/08/2021 for new onset atrial fibrillation after presenting with shortness of breath and volume overload.  BNP elevated 453.  Echo with LVEF 45% with moderate MR.  She was diuresed 12 L.  Underwent TEE/DCCV with a quick return to atrial fibrillation with plan for rate control.  Fluoxetine discontinued in the hospital due to hyponatremia and Synthroid dose adjusted.   Clinic 01/08/2022  Lasix was increased to 40 mg daily for 2 days. Entresto was reduced to half tablet twice daily to prevent hypotension.  Since that time there have been multiple phone conversations due to concern for shortness of breath, edema, cough.  For cough she was encouraged to discuss with her PCP and provided short course of Tessalon Perles.  Her  Lasix dose has been intermittently adjusted.   Seen via phone visit 02/25/22 recommended to increase Entresto to 24-26 mg twice daily and continue Lasix 40 mg daily.  Labs at PCP 02/25/2022 revealed creatinine 1.17, GFR 41, proBNP 4007, normal liver enzymes.  Of note visit was virtual due to recent passing of her husband and condolences offered.   At follow up 03/13/22 still with some dyspnea, cough. Did not feel she was strong enough for cardioversion and wished to wait. At follow up 04/17/22 she was agreeable to cardioversion but wished to wait until August.    Presents today for follow up and to discuss DCCV. No chest pain. Stable exertional dyspnea. Still with shortness of breath first thing in the morning that improves with albuterol. Encouraged to schedule her pulmonology consult which had to be cancelled due to her husbands passing. Did have dental work earlier today with some diarrhea due to pre-dental work antibiotics.   Past Medical History:  Diagnosis Date   Dyspnea    diastolic dysfunction by echo 2012   GERD (gastroesophageal reflux disease)    Hypertension    Mild mitral and aortic regurgitation    Osteoarthritis    Right bundle branch block 2014   Thyroid disease    Past Surgical History:  Procedure Laterality Date   arm surgery     following an accident   CARDIOVERSION N/A 12/11/2021   Procedure: CARDIOVERSION;  Surgeon: Werner Lean, MD;  Location: [facility];  Service: Cardiovascular;  Laterality: N/A;   KNEE SURGERY     Following an accident  TEE WITHOUT CARDIOVERSION N/A 12/11/2021   Procedure: TRANSESOPHAGEAL ECHOCARDIOGRAM (TEE);  Surgeon: Werner Lean, MD;  Location: [facility];  Service: Cardiovascular;  Laterality: N/A;   TONSILLECTOMY AND ADENOIDECTOMY     VAGINAL HYSTERECTOMY       Current Meds  Medication Sig   albuterol (VENTOLIN HFA) 108 (90 Base) MCG/ACT inhaler SMARTSIG:1 Puff(s) By Mouth Every 4 Hours PRN   amiodarone (PACERONE)  200 MG tablet Take 1 tablet (200 mg total) by mouth daily.   apixaban (ELIQUIS) 5 MG TABS tablet Take 1 tablet (5 mg total) by mouth 2 (two) times daily.   benzonatate (TESSALON PERLES) 100 MG capsule Take 1 capsule (100 mg total) by mouth 3 (three) times daily as needed for cough.   furosemide (LASIX) 20 MG tablet Take '40mg'$  (2 tablets) daily. May take additional '20mg'$  (1 tablet) as needed for weight gain of 2 pounds overnight or 5 pounds in one week   levothyroxine (SYNTHROID) 200 MCG tablet Take 1 tablet (200 mcg total) by mouth daily before breakfast.   LORazepam (ATIVAN) 0.5 MG tablet Take 1 tablet (0.5 mg total) by mouth daily as needed for anxiety or sleep.   lovastatin (MEVACOR) 40 MG tablet Take 1 tablet by mouth every evening.   metoprolol succinate (TOPROL-XL) 50 MG 24 hr tablet Take 1 tablet (50 mg total) by mouth daily. Take with or immediately following a meal.   omeprazole (PRILOSEC) 10 MG capsule Take 10 mg by mouth every evening.   potassium chloride SA (KLOR-CON M) 20 MEQ tablet Take 1 tablet (20 mEq total) by mouth daily.   sacubitril-valsartan (ENTRESTO) 24-26 MG TAKE 1 TABLET BY MOUTH TWICE A DAY     Allergies:   Fluoxetine and Serotonin reuptake inhibitors (ssris)   Social History   Tobacco Use   Smoking status: Never   Smokeless tobacco: Never  Vaping Use   Vaping Use: Never used  Substance Use Topics   Alcohol use: No   Drug use: No     Family Hx: The patient's family history includes Heart attack in her mother.  ROS:   Please see the history of present illness.     All other systems reviewed and are negative.   Prior CV studies:   The following studies were reviewed today:  ECHO 12/09/2021:   1. Left ventricular ejection fraction, by estimation, is 45%. The left  ventricle has mildly decreased function. The left ventricle demonstrates global hypokinesis. Left ventricular diastolic parameters are indeterminate.   2. Right ventricular systolic function  is mildly reduced. The right  ventricular size is normal. There is mildly elevated pulmonary artery systolic pressure. The estimated right ventricular systolic pressure is 62.3 mmHg.   3. Left atrial size was mildly dilated.   4. The mitral valve is abnormal. Moderate mitral valve regurgitation. No evidence of mitral stenosis.   5. The aortic valve is tricuspid. There is moderate calcification of the aortic valve. Aortic valve regurgitation is mild. Aortic valve  sclerosis/calcification is present, without any evidence of aortic  stenosis.   6. Aortic dilatation noted. There is mild dilatation of the ascending  aorta, measuring 40 mm.   7. The inferior vena cava is dilated in size with <50% respiratory  variability, suggesting right atrial pressure of 15 mmHg.   8. There was a left pleural effusion.   9. The patient was in atrial fibrillation.    ECHO TEE 12/11/2021:   1. Left ventricular ejection fraction, by estimation, is 45 to 50%.  Left ventricular ejection fraction by 3D volume is 46 %. The left ventricle has mildly decreased function. The left ventricle demonstrates global hypokinesis. Left ventricular diastolic   function could not be evaluated.   2. Right ventricular systolic function is normal. The right ventricular size is normal.   3. Left atrial size was mildly dilated. No left atrial/left atrial  appendage thrombus was detected.   4. Large pleural effusion in the left lateral region.   5. The mitral valve is grossly normal. Moderate mitral valve  regurgitation. No evidence of mitral stenosis.   6. Tricuspid valve regurgitation is mild to moderate.   7. The aortic valve is tricuspid. Aortic valve regurgitation is mild to  moderate. No aortic stenosis is present. Aortic valve mean gradient  measures 3.0 mmHg.   8. There is mild (Grade II) plaque involving the descending aorta.   Labs/Other Tests and Data Reviewed:    EKG:  An ECG dated 04/18/22 was personally reviewed today  and demonstrated:  rate controlled atrial fibrillation 66bpm with stable RBBB  Recent Labs: 12/08/2021: TSH 7.190 12/25/2021: ALT 96; Hemoglobin 11.6; Platelets 313 01/08/2022: Magnesium 1.8 01/22/2022: BNP 699.9; BUN 13; Creatinine, Ser 1.06; Potassium 4.9; Sodium 133   Recent Lipid Panel No results found for: "CHOL", "TRIG", "HDL", "CHOLHDL", "LDLCALC", "LDLDIRECT"  Wt Readings from Last 3 Encounters:  05/14/22 198 lb 6.4 oz (90 kg)  04/17/22 198 lb (89.8 kg)  03/13/22 199 lb 9.6 oz (90.5 kg)    Risk Assessment/Calculations:    CHA2DS2-VASc Score = 5   This indicates a 7.2% annual risk of stroke. The patient's score is based upon: CHF History: 1 HTN History: 1 Diabetes History: 0 Stroke History: 0 Vascular Disease History: 0 Age Score: 2 Gender Score: 1      Objective:    Vital Signs:  BP 112/65   Pulse 66   Ht '5\' 5"'$  (1.651 m)   Wt 198 lb 6.4 oz (90 kg)   BMI 33.02 kg/m    VITAL SIGNS:  reviewed  ASSESSMENT & PLAN:    PAF /hypercoagulable state -  Amiodarone initiated due to quick return of atrial fib after cardoiversion 12/2021.CHA2DS2-VASc Score = 5 [CHF History: 1, HTN History: 1, Diabetes History: 0, Stroke History: 0, Vascular Disease History: 0, Age Score: 2, Gender Score: 1].  Therefore, the patient's annual risk of stroke is 7.2 %.    Continue ELiquis '5mg'$  BID, denies bleeding complications. No missed doses.  Plan for cardioversion. No signs of amiodarone toxicity.   HFrEF -weight stable. LVEF 45%. Continue GDMT Entresto, Lasix.  Low sodium diet, fluid restriction <2L, and daily weights encouraged. Educated to contact our office for weight gain of 2 lbs overnight or 5 lbs in one week.  Prefers not to add additional medication at this time but spironolactone could be considered in the future.  Did have recent UTI with significant confusion so would hesitate to use SGLT2.    HLD -continue lovastatin per PCP  Hypothyroidism - Continue to follow with PCP.   HTn - BP  well controlled. Continue current antihypertensive regimen.        Shared Decision Making/Informed Consent The risks (stroke, cardiac arrhythmias rarely resulting in the need for a temporary or permanent pacemaker, skin irritation or burns and complications associated with conscious sedation including aspiration, arrhythmia, respiratory failure and death), benefits (restoration of normal sinus rhythm) and alternatives of a direct current cardioversion were explained in detail to Ms. Kegel and she agrees to proceed.  Time:   Today, I have spent 13 minutes with the patient with telehealth technology discussing the above problems.    Medication Adjustments/Labs and Tests Ordered: Current medicines are reviewed at length with the patient today.  Concerns regarding medicines are outlined above.   Tests Ordered: No orders of the defined types were placed in this encounter.  Medication Changes: No orders of the defined types were placed in this encounter.  Follow Up:  In Person  as scheduled  Signed, Loel Dubonnet, NP  05/14/2022 3:25 PM    [place]

## 2022-05-14 NOTE — Patient Instructions (Addendum)
Medication Instructions:  Continue your current medications.   *If you need a refill on your cardiac medications before your next appointment, please call your pharmacy*   Lab Work: Labs 05/15/22 as already scheduled.    Testing/Procedures: Proceed with cardioversion as scheduled 05/22/22.   Follow-Up: At [facility], you and your health needs are our priority.  As part of our continuing mission to provide you with exceptional heart care, we have created designated Provider Care Teams.  These Care Teams include your primary Cardiologist (physician) and Advanced Practice Providers (APPs -  Physician Assistants and Nurse Practitioners) who all work together to provide you with the care you need, when you need it.  We recommend signing up for the patient portal called "MyChart".  Sign up information is provided on this After Visit Summary.  MyChart is used to connect with patients for Virtual Visits (Telemedicine).  Patients are able to view lab/test results, encounter notes, upcoming appointments, etc.  Non-urgent messages can be sent to your provider as well.   To learn more about what you can do with MyChart, go to [email or web address].    Your next appointment:   As scheduled   Other Instructions  You are scheduled for a Cardioversion on August 17th with Dr. Oval Linsey. Please arrive at the [facility] (Main Entrance A) at [facility]: [address] [place], [place] [address] at 10:30 am.  DIET: Nothing to eat or drink after midnight except a sip of water with medications (see medication instructions below)  FYI: For your safety, and to allow [place] to monitor your vital signs accurately during the surgery/procedure we request that if you have artificial nails, gel coating, SNS etc. Please have those removed prior to your surgery/procedure. Not having the nail coverings /polish removed may result in cancellation or delay of your surgery/procedure.  Medication  Instructions: Hold lasix morning of the procedure Continue your anticoagulant: ELIQUIS You will need to continue your anticoagulant after your procedure until you are told by your Provider that it is safe to stop  Labs: August 10th  You must have a responsible person to drive you home and stay in the waiting area during your procedure.  Failure to do so could result in cancellation. Bring your insurance cards.  *Special Note: Every effort is made to have your procedure done on time. Occasionally there are emergencies that occur at the hospital that may cause delays. Please be patient if a delay does occur.

## 2022-05-14 NOTE — Progress Notes (Signed)
Virtual Visit via Telephone Note   Because of Itsel S Dougal's co-morbid illnesses, she is at least at moderate risk for complications without adequate follow up.  This format is felt to be most appropriate for this patient at this time.  The patient did not have access to video technology/had technical difficulties with video requiring transitioning to audio format only (telephone).  All issues noted in this document were discussed and addressed.  No physical exam could be performed with this format.  Please refer to the patient's chart for her consent to telehealth for [facility].   Date:  05/14/2022   ID:  [name], DOB 03/10/1931, MRN [id number] The patient was identified using 2 identifiers.  Patient Location: Home Provider Location: Office/Clinic  PCP:  Merrilee Seashore, [place] Providers Cardiologist:  [name], MD     Evaluation Performed:  Follow-Up Visit  Chief Complaint:  Discuss cardioversion  History of Present Illness:    [name] is a 88 y.o. female with hypertension, hyperlipidemia, RBBB, paroxysmal atrial fibrillation, chronic systolic and diastolic heart failure. Last seen 04/17/22.    Seen in the ED 12/08/2021 for new onset atrial fibrillation after presenting with shortness of breath and volume overload.  BNP elevated 453.  Echo with LVEF 45% with moderate MR.  She was diuresed 12 L.  Underwent TEE/DCCV with a quick return to atrial fibrillation with plan for rate control.  Fluoxetine discontinued in the hospital due to hyponatremia and Synthroid dose adjusted.   Clinic 01/08/2022  Lasix was increased to 40 mg daily for 2 days. Entresto was reduced to half tablet twice daily to prevent hypotension.  Since that time there have been multiple phone conversations due to concern for shortness of breath, edema, cough.  For cough she was encouraged to discuss with her PCP and provided short course of Tessalon Perles.  Her  Lasix dose has been intermittently adjusted.   Seen via phone visit 02/25/22 recommended to increase Entresto to 24-26 mg twice daily and continue Lasix 40 mg daily.  Labs at PCP 02/25/2022 revealed creatinine 1.17, GFR 41, proBNP 4007, normal liver enzymes.  Of note visit was virtual due to recent passing of her husband and condolences offered.   At follow up 03/13/22 still with some dyspnea, cough. Did not feel she was strong enough for cardioversion and wished to wait. At follow up 04/17/22 she was agreeable to cardioversion but wished to wait until August.    Presents today for follow up and to discuss DCCV. No chest pain. Stable exertional dyspnea. Still with shortness of breath first thing in the morning that improves with albuterol. Encouraged to schedule her pulmonology consult which had to be cancelled due to her husbands passing. Did have dental work earlier today with some diarrhea due to pre-dental work antibiotics.   Past Medical History:  Diagnosis Date   Dyspnea    diastolic dysfunction by echo 2012   GERD (gastroesophageal reflux disease)    Hypertension    Mild mitral and aortic regurgitation    Osteoarthritis    Right bundle branch block 2014   Thyroid disease    Past Surgical History:  Procedure Laterality Date   arm surgery     following an accident   CARDIOVERSION N/A 12/11/2021   Procedure: CARDIOVERSION;  Surgeon: Werner Lean, MD;  Location: [facility];  Service: Cardiovascular;  Laterality: N/A;   KNEE SURGERY     Following an accident  TEE WITHOUT CARDIOVERSION N/A 12/11/2021   Procedure: TRANSESOPHAGEAL ECHOCARDIOGRAM (TEE);  Surgeon: Werner Lean, MD;  Location: [facility];  Service: Cardiovascular;  Laterality: N/A;   TONSILLECTOMY AND ADENOIDECTOMY     VAGINAL HYSTERECTOMY       Current Meds  Medication Sig   albuterol (VENTOLIN HFA) 108 (90 Base) MCG/ACT inhaler SMARTSIG:1 Puff(s) By Mouth Every 4 Hours PRN   amiodarone (PACERONE)  200 MG tablet Take 1 tablet (200 mg total) by mouth daily.   apixaban (ELIQUIS) 5 MG TABS tablet Take 1 tablet (5 mg total) by mouth 2 (two) times daily.   benzonatate (TESSALON PERLES) 100 MG capsule Take 1 capsule (100 mg total) by mouth 3 (three) times daily as needed for cough.   furosemide (LASIX) 20 MG tablet Take '40mg'$  (2 tablets) daily. May take additional '20mg'$  (1 tablet) as needed for weight gain of 2 pounds overnight or 5 pounds in one week   levothyroxine (SYNTHROID) 200 MCG tablet Take 1 tablet (200 mcg total) by mouth daily before breakfast.   LORazepam (ATIVAN) 0.5 MG tablet Take 1 tablet (0.5 mg total) by mouth daily as needed for anxiety or sleep.   lovastatin (MEVACOR) 40 MG tablet Take 1 tablet by mouth every evening.   metoprolol succinate (TOPROL-XL) 50 MG 24 hr tablet Take 1 tablet (50 mg total) by mouth daily. Take with or immediately following a meal.   omeprazole (PRILOSEC) 10 MG capsule Take 10 mg by mouth every evening.   potassium chloride SA (KLOR-CON M) 20 MEQ tablet Take 1 tablet (20 mEq total) by mouth daily.   sacubitril-valsartan (ENTRESTO) 24-26 MG TAKE 1 TABLET BY MOUTH TWICE A DAY     Allergies:   Fluoxetine and Serotonin reuptake inhibitors (ssris)   Social History   Tobacco Use   Smoking status: Never   Smokeless tobacco: Never  Vaping Use   Vaping Use: Never used  Substance Use Topics   Alcohol use: No   Drug use: No     Family Hx: The patient's family history includes Heart attack in her mother.  ROS:   Please see the history of present illness.     All other systems reviewed and are negative.   Prior CV studies:   The following studies were reviewed today:  ECHO 12/09/2021:   1. Left ventricular ejection fraction, by estimation, is 45%. The left  ventricle has mildly decreased function. The left ventricle demonstrates global hypokinesis. Left ventricular diastolic parameters are indeterminate.   2. Right ventricular systolic function  is mildly reduced. The right  ventricular size is normal. There is mildly elevated pulmonary artery systolic pressure. The estimated right ventricular systolic pressure is 76.1 mmHg.   3. Left atrial size was mildly dilated.   4. The mitral valve is abnormal. Moderate mitral valve regurgitation. No evidence of mitral stenosis.   5. The aortic valve is tricuspid. There is moderate calcification of the aortic valve. Aortic valve regurgitation is mild. Aortic valve  sclerosis/calcification is present, without any evidence of aortic  stenosis.   6. Aortic dilatation noted. There is mild dilatation of the ascending  aorta, measuring 40 mm.   7. The inferior vena cava is dilated in size with <50% respiratory  variability, suggesting right atrial pressure of 15 mmHg.   8. There was a left pleural effusion.   9. The patient was in atrial fibrillation.    ECHO TEE 12/11/2021:   1. Left ventricular ejection fraction, by estimation, is 45 to 50%.  Left ventricular ejection fraction by 3D volume is 46 %. The left ventricle has mildly decreased function. The left ventricle demonstrates global hypokinesis. Left ventricular diastolic   function could not be evaluated.   2. Right ventricular systolic function is normal. The right ventricular size is normal.   3. Left atrial size was mildly dilated. No left atrial/left atrial  appendage thrombus was detected.   4. Large pleural effusion in the left lateral region.   5. The mitral valve is grossly normal. Moderate mitral valve  regurgitation. No evidence of mitral stenosis.   6. Tricuspid valve regurgitation is mild to moderate.   7. The aortic valve is tricuspid. Aortic valve regurgitation is mild to  moderate. No aortic stenosis is present. Aortic valve mean gradient  measures 3.0 mmHg.   8. There is mild (Grade II) plaque involving the descending aorta.   Labs/Other Tests and Data Reviewed:    EKG:  An ECG dated 04/18/22 was personally reviewed today  and demonstrated:  rate controlled atrial fibrillation 66bpm with stable RBBB  Recent Labs: 12/08/2021: TSH 7.190 12/25/2021: ALT 96; Hemoglobin 11.6; Platelets 313 01/08/2022: Magnesium 1.8 01/22/2022: BNP 699.9; BUN 13; Creatinine, Ser 1.06; Potassium 4.9; Sodium 133   Recent Lipid Panel No results found for: "CHOL", "TRIG", "HDL", "CHOLHDL", "LDLCALC", "LDLDIRECT"  Wt Readings from Last 3 Encounters:  05/14/22 198 lb 6.4 oz (90 kg)  04/17/22 198 lb (89.8 kg)  03/13/22 199 lb 9.6 oz (90.5 kg)    Risk Assessment/Calculations:    CHA2DS2-VASc Score = 5   This indicates a 7.2% annual risk of stroke. The patient's score is based upon: CHF History: 1 HTN History: 1 Diabetes History: 0 Stroke History: 0 Vascular Disease History: 0 Age Score: 2 Gender Score: 1      Objective:    Vital Signs:  BP 112/65   Pulse 66   Ht '5\' 5"'$  (1.651 m)   Wt 198 lb 6.4 oz (90 kg)   BMI 33.02 kg/m    VITAL SIGNS:  reviewed  ASSESSMENT & PLAN:    PAF /hypercoagulable state -  Amiodarone initiated due to quick return of atrial fib after cardoiversion 12/2021.CHA2DS2-VASc Score = 5 [CHF History: 1, HTN History: 1, Diabetes History: 0, Stroke History: 0, Vascular Disease History: 0, Age Score: 2, Gender Score: 1].  Therefore, the patient's annual risk of stroke is 7.2 %.    Continue ELiquis '5mg'$  BID, denies bleeding complications. No missed doses.  Plan for cardioversion. No signs of amiodarone toxicity.   HFrEF -weight stable. LVEF 45%. Continue GDMT Entresto, Lasix.  Low sodium diet, fluid restriction <2L, and daily weights encouraged. Educated to contact our office for weight gain of 2 lbs overnight or 5 lbs in one week.  Prefers not to add additional medication at this time but spironolactone could be considered in the future.  Did have recent UTI with significant confusion so would hesitate to use SGLT2.    HLD -continue lovastatin per PCP  Hypothyroidism - Continue to follow with PCP.   HTn - BP  well controlled. Continue current antihypertensive regimen.        Shared Decision Making/Informed Consent The risks (stroke, cardiac arrhythmias rarely resulting in the need for a temporary or permanent pacemaker, skin irritation or burns and complications associated with conscious sedation including aspiration, arrhythmia, respiratory failure and death), benefits (restoration of normal sinus rhythm) and alternatives of a direct current cardioversion were explained in detail to Ms. Nuncio and she agrees to proceed.  Time:   Today, I have spent 13 minutes with the patient with telehealth technology discussing the above problems.    Medication Adjustments/Labs and Tests Ordered: Current medicines are reviewed at length with the patient today.  Concerns regarding medicines are outlined above.   Tests Ordered: No orders of the defined types were placed in this encounter.  Medication Changes: No orders of the defined types were placed in this encounter.  Follow Up:  In Person  as scheduled  Signed, Loel Dubonnet, NP  05/14/2022 3:25 PM    [place]

## 2022-05-15 ENCOUNTER — Encounter (HOSPITAL_COMMUNITY): Payer: Self-pay | Admitting: Cardiovascular Disease

## 2022-05-15 DIAGNOSIS — I4819 Other persistent atrial fibrillation: Secondary | ICD-10-CM | POA: Diagnosis not present

## 2022-05-15 DIAGNOSIS — D6859 Other primary thrombophilia: Secondary | ICD-10-CM | POA: Diagnosis not present

## 2022-05-16 ENCOUNTER — Encounter (HOSPITAL_BASED_OUTPATIENT_CLINIC_OR_DEPARTMENT_OTHER): Payer: Self-pay | Admitting: Family

## 2022-05-16 ENCOUNTER — Telehealth (HOSPITAL_BASED_OUTPATIENT_CLINIC_OR_DEPARTMENT_OTHER): Payer: Self-pay

## 2022-05-16 LAB — BASIC METABOLIC PANEL
BUN/Creatinine Ratio: 17 (ref 12–28)
BUN: 21 mg/dL (ref 8–27)
CO2: 19 mmol/L — ABNORMAL LOW (ref 20–29)
Calcium: 9.2 mg/dL (ref 8.7–10.3)
Chloride: 103 mmol/L (ref 96–106)
Creatinine, Ser: 1.25 mg/dL — ABNORMAL HIGH (ref 0.57–1.00)
Glucose: 101 mg/dL — ABNORMAL HIGH (ref 70–99)
Potassium: 4.7 mmol/L (ref 3.5–5.2)
Sodium: 140 mmol/L (ref 134–144)
eGFR: 41 mL/min/{1.73_m2} — ABNORMAL LOW (ref >59)

## 2022-05-16 LAB — CBC
Hematocrit: 42 % (ref 34.0–46.6)
Hemoglobin: 13 g/dL (ref 11.1–15.9)
MCH: 26.5 pg — ABNORMAL LOW (ref 26.6–33.0)
MCHC: 31 g/dL — ABNORMAL LOW (ref 31.5–35.7)
MCV: 86 fL (ref 79–97)
Platelets: 216 10*3/uL (ref 150–450)
RBC: 4.91 x10E6/uL (ref 3.77–5.28)
RDW: 13.8 % (ref 11.7–15.4)
WBC: 6.2 10*3/uL (ref 3.4–10.8)

## 2022-05-16 NOTE — Telephone Encounter (Addendum)
RN called [name] (DPR on file) to provide the following information,    ----- Message from Loel Dubonnet, NP sent at 05/16/2022  8:44 AM EDT ----- Stable kidney function. Normal electrolytes. CBC with no evidence of anemia nor infection.  Proceed with cardioversion as scheduled.  Patient and daughter very appreciative of call and good news. Verbalized understanding.

## 2022-05-22 ENCOUNTER — Observation Stay (HOSPITAL_COMMUNITY): Payer: Medicare HMO

## 2022-05-22 ENCOUNTER — Ambulatory Visit (HOSPITAL_BASED_OUTPATIENT_CLINIC_OR_DEPARTMENT_OTHER): Payer: Medicare HMO | Admitting: Certified Registered"

## 2022-05-22 ENCOUNTER — Emergency Department (HOSPITAL_COMMUNITY): Payer: Medicare HMO

## 2022-05-22 ENCOUNTER — Encounter (HOSPITAL_COMMUNITY): Payer: Self-pay | Admitting: Internal Medicine

## 2022-05-22 ENCOUNTER — Encounter (HOSPITAL_COMMUNITY): Payer: Self-pay | Admitting: Cardiovascular Disease

## 2022-05-22 ENCOUNTER — Inpatient Hospital Stay (HOSPITAL_COMMUNITY)
Admission: EM | Admit: 2022-05-22 | Discharge: 2022-05-28 | DRG: 242 | Disposition: A | Discharge status: Home Health | Payer: Medicare HMO | Attending: Internal Medicine | Admitting: Internal Medicine

## 2022-05-22 ENCOUNTER — Ambulatory Visit (HOSPITAL_BASED_OUTPATIENT_CLINIC_OR_DEPARTMENT_OTHER)
Admission: RE | Admit: 2022-05-22 | Discharge: 2022-05-22 | Disposition: A | Discharge status: Home / Self Care | Payer: Medicare HMO | Source: Home / Self Care | Attending: Cardiovascular Disease | Admitting: Cardiovascular Disease

## 2022-05-22 ENCOUNTER — Ambulatory Visit (HOSPITAL_COMMUNITY): Payer: Medicare HMO | Admitting: Certified Registered"

## 2022-05-22 ENCOUNTER — Encounter (HOSPITAL_COMMUNITY)
Admission: RE | Disposition: A | Discharge status: Home / Self Care | Payer: Self-pay | Source: Home / Self Care | Attending: Cardiovascular Disease

## 2022-05-22 ENCOUNTER — Other Ambulatory Visit: Payer: Self-pay

## 2022-05-22 DIAGNOSIS — N179 Acute kidney failure, unspecified: Secondary | ICD-10-CM | POA: Diagnosis present

## 2022-05-22 DIAGNOSIS — I110 Hypertensive heart disease with heart failure: Secondary | ICD-10-CM | POA: Insufficient documentation

## 2022-05-22 DIAGNOSIS — I495 Sick sinus syndrome: Secondary | ICD-10-CM | POA: Diagnosis not present

## 2022-05-22 DIAGNOSIS — Z743 Need for continuous supervision: Secondary | ICD-10-CM | POA: Diagnosis not present

## 2022-05-22 DIAGNOSIS — I5022 Chronic systolic (congestive) heart failure: Secondary | ICD-10-CM | POA: Insufficient documentation

## 2022-05-22 DIAGNOSIS — K219 Gastro-esophageal reflux disease without esophagitis: Secondary | ICD-10-CM | POA: Insufficient documentation

## 2022-05-22 DIAGNOSIS — G9341 Metabolic encephalopathy: Secondary | ICD-10-CM | POA: Diagnosis not present

## 2022-05-22 DIAGNOSIS — I4819 Other persistent atrial fibrillation: Secondary | ICD-10-CM | POA: Diagnosis not present

## 2022-05-22 DIAGNOSIS — I509 Heart failure, unspecified: Secondary | ICD-10-CM

## 2022-05-22 DIAGNOSIS — D649 Anemia, unspecified: Secondary | ICD-10-CM | POA: Insufficient documentation

## 2022-05-22 DIAGNOSIS — R7989 Other specified abnormal findings of blood chemistry: Secondary | ICD-10-CM | POA: Diagnosis not present

## 2022-05-22 DIAGNOSIS — D759 Disease of blood and blood-forming organs, unspecified: Secondary | ICD-10-CM | POA: Insufficient documentation

## 2022-05-22 DIAGNOSIS — Z888 Allergy status to other drugs, medicaments and biological substances status: Secondary | ICD-10-CM

## 2022-05-22 DIAGNOSIS — Z79899 Other long term (current) drug therapy: Secondary | ICD-10-CM | POA: Insufficient documentation

## 2022-05-22 DIAGNOSIS — I080 Rheumatic disorders of both mitral and aortic valves: Secondary | ICD-10-CM | POA: Diagnosis present

## 2022-05-22 DIAGNOSIS — J9811 Atelectasis: Secondary | ICD-10-CM | POA: Diagnosis not present

## 2022-05-22 DIAGNOSIS — N1832 Chronic kidney disease, stage 3b: Secondary | ICD-10-CM | POA: Diagnosis not present

## 2022-05-22 DIAGNOSIS — Z20822 Contact with and (suspected) exposure to covid-19: Secondary | ICD-10-CM | POA: Diagnosis present

## 2022-05-22 DIAGNOSIS — Z66 Do not resuscitate: Secondary | ICD-10-CM | POA: Diagnosis not present

## 2022-05-22 DIAGNOSIS — E785 Hyperlipidemia, unspecified: Secondary | ICD-10-CM | POA: Insufficient documentation

## 2022-05-22 DIAGNOSIS — Z87440 Personal history of urinary (tract) infections: Secondary | ICD-10-CM | POA: Insufficient documentation

## 2022-05-22 DIAGNOSIS — I959 Hypotension, unspecified: Secondary | ICD-10-CM | POA: Diagnosis present

## 2022-05-22 DIAGNOSIS — R42 Dizziness and giddiness: Secondary | ICD-10-CM | POA: Diagnosis not present

## 2022-05-22 DIAGNOSIS — I77810 Thoracic aortic ectasia: Secondary | ICD-10-CM | POA: Diagnosis not present

## 2022-05-22 DIAGNOSIS — E039 Hypothyroidism, unspecified: Secondary | ICD-10-CM | POA: Insufficient documentation

## 2022-05-22 DIAGNOSIS — B961 Klebsiella pneumoniae [K. pneumoniae] as the cause of diseases classified elsewhere: Secondary | ICD-10-CM | POA: Diagnosis present

## 2022-05-22 DIAGNOSIS — E871 Hypo-osmolality and hyponatremia: Secondary | ICD-10-CM | POA: Diagnosis not present

## 2022-05-22 DIAGNOSIS — M1990 Unspecified osteoarthritis, unspecified site: Secondary | ICD-10-CM | POA: Diagnosis present

## 2022-05-22 DIAGNOSIS — Z7901 Long term (current) use of anticoagulants: Secondary | ICD-10-CM | POA: Insufficient documentation

## 2022-05-22 DIAGNOSIS — I4891 Unspecified atrial fibrillation: Secondary | ICD-10-CM

## 2022-05-22 DIAGNOSIS — D638 Anemia in other chronic diseases classified elsewhere: Secondary | ICD-10-CM | POA: Diagnosis not present

## 2022-05-22 DIAGNOSIS — N390 Urinary tract infection, site not specified: Secondary | ICD-10-CM | POA: Diagnosis present

## 2022-05-22 DIAGNOSIS — I5023 Acute on chronic systolic (congestive) heart failure: Secondary | ICD-10-CM | POA: Diagnosis not present

## 2022-05-22 DIAGNOSIS — I130 Hypertensive heart and chronic kidney disease with heart failure and stage 1 through stage 4 chronic kidney disease, or unspecified chronic kidney disease: Secondary | ICD-10-CM | POA: Diagnosis not present

## 2022-05-22 DIAGNOSIS — I480 Paroxysmal atrial fibrillation: Secondary | ICD-10-CM | POA: Insufficient documentation

## 2022-05-22 DIAGNOSIS — Z6833 Body mass index (BMI) 33.0-33.9, adult: Secondary | ICD-10-CM

## 2022-05-22 DIAGNOSIS — R0602 Shortness of breath: Secondary | ICD-10-CM | POA: Diagnosis not present

## 2022-05-22 DIAGNOSIS — E669 Obesity, unspecified: Secondary | ICD-10-CM | POA: Diagnosis present

## 2022-05-22 DIAGNOSIS — Z79890 Hormone replacement therapy (postmenopausal): Secondary | ICD-10-CM

## 2022-05-22 DIAGNOSIS — R001 Bradycardia, unspecified: Secondary | ICD-10-CM | POA: Diagnosis not present

## 2022-05-22 DIAGNOSIS — I442 Atrioventricular block, complete: Secondary | ICD-10-CM | POA: Diagnosis present

## 2022-05-22 DIAGNOSIS — R55 Syncope and collapse: Secondary | ICD-10-CM | POA: Diagnosis not present

## 2022-05-22 DIAGNOSIS — Z90710 Acquired absence of both cervix and uterus: Secondary | ICD-10-CM

## 2022-05-22 DIAGNOSIS — Z8249 Family history of ischemic heart disease and other diseases of the circulatory system: Secondary | ICD-10-CM

## 2022-05-22 HISTORY — PX: CARDIOVERSION: SHX1299

## 2022-05-22 LAB — I-STAT CHEM 8, ED
BUN: 28 mg/dL — ABNORMAL HIGH (ref 8–23)
Calcium, Ion: 1.09 mmol/L — ABNORMAL LOW (ref 1.15–1.40)
Chloride: 104 mmol/L (ref 98–111)
Creatinine, Ser: 1.7 mg/dL — ABNORMAL HIGH (ref 0.44–1.00)
Glucose, Bld: 91 mg/dL (ref 70–99)
HCT: 39 % (ref 36.0–46.0)
Hemoglobin: 13.3 g/dL (ref 12.0–15.0)
Potassium: 4.1 mmol/L (ref 3.5–5.1)
Sodium: 138 mmol/L (ref 135–145)
TCO2: 22 mmol/L (ref 22–32)

## 2022-05-22 LAB — CBC WITH DIFFERENTIAL/PLATELET
Abs Immature Granulocytes: 0.02 10*3/uL (ref 0.00–0.07)
Basophils Absolute: 0.1 10*3/uL (ref 0.0–0.1)
Basophils Relative: 1 %
Eosinophils Absolute: 0.2 10*3/uL (ref 0.0–0.5)
Eosinophils Relative: 2 %
HCT: 40.2 % (ref 36.0–46.0)
Hemoglobin: 12.4 g/dL (ref 12.0–15.0)
Immature Granulocytes: 0 %
Lymphocytes Relative: 29 %
Lymphs Abs: 2 10*3/uL (ref 0.7–4.0)
MCH: 27.1 pg (ref 26.0–34.0)
MCHC: 30.8 g/dL (ref 30.0–36.0)
MCV: 88 fL (ref 80.0–100.0)
Monocytes Absolute: 0.6 10*3/uL (ref 0.1–1.0)
Monocytes Relative: 9 %
Neutro Abs: 3.9 10*3/uL (ref 1.7–7.7)
Neutrophils Relative %: 59 %
Platelets: 208 10*3/uL (ref 150–400)
RBC: 4.57 MIL/uL (ref 3.87–5.11)
RDW: 14.5 % (ref 11.5–15.5)
WBC: 6.8 10*3/uL (ref 4.0–10.5)
nRBC: 0 % (ref 0.0–0.2)

## 2022-05-22 LAB — RESP PANEL BY RT-PCR (FLU A&B, COVID) ARPGX2
Influenza A by PCR: NEGATIVE
Influenza B by PCR: NEGATIVE
SARS Coronavirus 2 by RT PCR: NEGATIVE

## 2022-05-22 LAB — COMPREHENSIVE METABOLIC PANEL
ALT: 43 U/L (ref 0–44)
AST: 51 U/L — ABNORMAL HIGH (ref 15–41)
Albumin: 3.6 g/dL (ref 3.5–5.0)
Alkaline Phosphatase: 92 U/L (ref 38–126)
Anion gap: 10 (ref 5–15)
BUN: 25 mg/dL — ABNORMAL HIGH (ref 8–23)
CO2: 21 mmol/L — ABNORMAL LOW (ref 22–32)
Calcium: 9 mg/dL (ref 8.9–10.3)
Chloride: 108 mmol/L (ref 98–111)
Creatinine, Ser: 1.68 mg/dL — ABNORMAL HIGH (ref 0.44–1.00)
GFR, Estimated: 29 mL/min — ABNORMAL LOW (ref >60)
Glucose, Bld: 99 mg/dL (ref 70–99)
Potassium: 4.2 mmol/L (ref 3.5–5.1)
Sodium: 139 mmol/L (ref 135–145)
Total Bilirubin: 0.4 mg/dL (ref 0.3–1.2)
Total Protein: 6.3 g/dL — ABNORMAL LOW (ref 6.5–8.1)

## 2022-05-22 LAB — BRAIN NATRIURETIC PEPTIDE: B Natriuretic Peptide: 335.8 pg/mL — ABNORMAL HIGH (ref 0.0–100.0)

## 2022-05-22 LAB — TROPONIN I (HIGH SENSITIVITY)
Troponin I (High Sensitivity): 6 ng/L (ref <18)
Troponin I (High Sensitivity): 9 ng/L (ref <18)

## 2022-05-22 LAB — GLUCOSE, CAPILLARY: Glucose-Capillary: 103 mg/dL — ABNORMAL HIGH (ref 70–99)

## 2022-05-22 LAB — MAGNESIUM: Magnesium: 2 mg/dL (ref 1.7–2.4)

## 2022-05-22 SURGERY — CARDIOVERSION
Anesthesia: General

## 2022-05-22 MED ORDER — PROPOFOL 10 MG/ML IV BOLUS
INTRAVENOUS | Status: DC | PRN
  Administered 2022-05-22: 50 mg via INTRAVENOUS
  Administered 2022-05-22: 10 mg via INTRAVENOUS

## 2022-05-22 MED ORDER — LORAZEPAM 2 MG/ML IJ SOLN
0.2500 mg | Freq: Once | INTRAMUSCULAR | Status: AC
  Administered 2022-05-23: 0.25 mg via INTRAVENOUS
  Filled 2022-05-22: qty 1

## 2022-05-22 MED ORDER — ATROPINE SULFATE 1 MG/10ML IJ SOSY
PREFILLED_SYRINGE | INTRAMUSCULAR | Status: AC
  Filled 2022-05-22: qty 10

## 2022-05-22 MED ORDER — METOPROLOL SUCCINATE ER 50 MG PO TB24: 25.0000 mg | ORAL_TABLET | Freq: Every day | ORAL | 0 refills | Status: DC

## 2022-05-22 MED ORDER — LEVOTHYROXINE SODIUM 100 MCG PO TABS
200.0000 ug | ORAL_TABLET | Freq: Every day | ORAL | Status: DC
  Administered 2022-05-23: 200 ug via ORAL
  Filled 2022-05-22: qty 2

## 2022-05-22 MED ORDER — LIDOCAINE 2% (20 MG/ML) 5 ML SYRINGE
INTRAMUSCULAR | Status: DC | PRN
  Administered 2022-05-22: 100 mg via INTRAVENOUS

## 2022-05-22 MED ORDER — APIXABAN 5 MG PO TABS
5.0000 mg | ORAL_TABLET | Freq: Two times a day (BID) | ORAL | Status: DC
Start: 2022-05-22 — End: 2022-05-23
  Administered 2022-05-23: 5 mg via ORAL
  Filled 2022-05-22 (×2): qty 1

## 2022-05-22 MED ORDER — MORPHINE SULFATE (PF) 4 MG/ML IV SOLN
4.0000 mg | Freq: Once | INTRAVENOUS | Status: AC
  Administered 2022-05-22: 4 mg via INTRAVENOUS
  Filled 2022-05-22: qty 1

## 2022-05-22 MED ORDER — SODIUM CHLORIDE 0.9 % IV SOLN: INTRAVENOUS | Status: DC

## 2022-05-22 MED ORDER — ONDANSETRON HCL 4 MG/2ML IJ SOLN
4.0000 mg | Freq: Once | INTRAMUSCULAR | Status: AC
Start: 2022-05-22 — End: 2022-05-22
  Administered 2022-05-22: 4 mg via INTRAVENOUS
  Filled 2022-05-22: qty 2

## 2022-05-22 MED ORDER — ATROPINE SULFATE 1 MG/ML IV SOLN
0.4000 mg | Freq: Once | INTRAVENOUS | Status: AC
  Administered 2022-05-22: 0.5 mg via INTRAVENOUS

## 2022-05-22 NOTE — ED Triage Notes (Signed)
Pt BIB [facility] from home d/t bradycardia. Pt was discharged earlier today following cardioversion for A Fib. Pt then began to feel weak, faint, and SOB. EMS found pt found HR 28, irregular. Given 1 mg atropine with improvement. Second dose 1 mg atropine given HR 30s. EMS BP 120/53

## 2022-05-22 NOTE — ED Notes (Signed)
ED TO INPATIENT HANDOFF REPORT  ED Nurse Name and Phone #: Shirlee Limerick [phone]  S Name/Age/Gender [name] 88 y.o. female Room/Bed: RESUSC/RESUSC  Code Status   Code Status: Full Code  Home/SNF/Other Home Patient oriented to: self, place, time, and situation Is this baseline? Yes   Triage Complete: Triage complete  Chief Complaint Bradycardia [R00.1]  Triage Note Pt BIB [facility] from home d/t bradycardia. Pt was discharged earlier today following cardioversion for A Fib. Pt then began to feel weak, faint, and SOB. EMS found pt found HR 28, irregular. Given 1 mg atropine with improvement. Second dose 1 mg atropine given HR 30s. EMS BP 120/53     Allergies Allergies  Allergen Reactions   Fluoxetine     Other reaction(s): hyponatremia   Serotonin Reuptake Inhibitors (Ssris)     Other reaction(s): hyponatremia    Level of Care/Admitting Diagnosis ED Disposition     ED Disposition  Admit   Condition  --   Comment  Hospital Area: [facility] [100100]  Level of Care: Progressive [102]  Admit to Progressive based on following criteria: CARDIOVASCULAR & THORACIC of moderate stability with acute coronary syndrome symptoms/low risk myocardial infarction/hypertensive urgency/arrhythmias/heart failure potentially compromising stability and stable post cardiovascular intervention patients.  May place patient in observation at [facility] or [facility] if equivalent level of care is available:: No  Covid Evaluation: Asymptomatic - no recent exposure (last 10 days) testing not required  Diagnosis: Bradycardia [222979]  Admitting Physician: [name] [[id number]]  Attending Physician: [name] [phone]          B Medical/Surgery History Past Medical History:  Diagnosis Date   Dyspnea    diastolic dysfunction by echo 2012   GERD (gastroesophageal reflux disease)    Hypertension    Mild mitral and aortic regurgitation    Osteoarthritis     Right bundle branch block 2014   Thyroid disease    Past Surgical History:  Procedure Laterality Date   arm surgery     following an accident   CARDIOVERSION N/A 12/11/2021   Procedure: CARDIOVERSION;  Surgeon: Werner Lean, MD;  Location: [facility];  Service: Cardiovascular;  Laterality: N/A;   KNEE SURGERY     Following an accident   TEE WITHOUT CARDIOVERSION N/A 12/11/2021   Procedure: TRANSESOPHAGEAL ECHOCARDIOGRAM (TEE);  Surgeon: Werner Lean, MD;  Location: [facility];  Service: Cardiovascular;  Laterality: N/A;   TONSILLECTOMY AND ADENOIDECTOMY     VAGINAL HYSTERECTOMY       A IV Location/Drains/Wounds Patient Lines/Drains/Airways Status     Active Line/Drains/Airways     Name Placement date Placement time Site Days   Peripheral IV 05/22/22 20 G 1" Right Forearm 05/22/22  2007  Forearm  less than 1   External Urinary Catheter 12/24/21  1805  --  149   Pressure Injury 12/18/21 Sacrum Mid Stage 2 -  Partial thickness loss of dermis presenting as a shallow open injury with a red, pink wound bed without slough. prior moisture associated skin damage now open 12/18/21  1900  -- 155            Intake/Output Last 24 hours No intake or output data in the 24 hours ending 05/22/22 2221  Labs/Imaging Results for orders placed or performed during the hospital encounter of 05/22/22 (from the past 48 hour(s))  Comprehensive metabolic panel     Status: Abnormal   Collection Time: 05/22/22  8:10 PM  Result Value  Ref Range   Sodium 139 135 - 145 mmol/L   Potassium 4.2 3.5 - 5.1 mmol/L   Chloride 108 98 - 111 mmol/L   CO2 21 (L) 22 - 32 mmol/L   Glucose, Bld 99 70 - 99 mg/dL    Comment: Glucose reference range applies only to samples taken after fasting for at least 8 hours.   BUN 25 (H) 8 - 23 mg/dL   Creatinine, Ser 1.68 (H) 0.44 - 1.00 mg/dL   Calcium 9.0 8.9 - 10.3 mg/dL   Total Protein 6.3 (L) 6.5 - 8.1 g/dL   Albumin 3.6 3.5 - 5.0 g/dL   AST  51 (H) 15 - 41 U/L   ALT 43 0 - 44 U/L   Alkaline Phosphatase 92 38 - 126 U/L   Total Bilirubin 0.4 0.3 - 1.2 mg/dL   GFR, Estimated 29 (L) >60 mL/min    Comment: (NOTE) Calculated using the CKD-EPI Creatinine Equation (2021)    Anion gap 10 5 - 15    Comment: Performed at [facility], [place] [address] [place], [place] [address]  CBC with Differential     Status: None   Collection Time: 05/22/22  8:10 PM  Result Value Ref Range   WBC 6.8 4.0 - 10.5 K/uL   RBC 4.57 3.87 - 5.11 MIL/uL   Hemoglobin 12.4 12.0 - 15.0 g/dL   HCT 40.2 36.0 - 46.0 %   MCV 88.0 80.0 - 100.0 fL   MCH 27.1 26.0 - 34.0 pg   MCHC 30.8 30.0 - 36.0 g/dL   RDW 14.5 11.5 - 15.5 %   Platelets 208 150 - 400 K/uL   nRBC 0.0 0.0 - 0.2 %   Neutrophils Relative % 59 %   Neutro Abs 3.9 1.7 - 7.7 K/uL   Lymphocytes Relative 29 %   Lymphs Abs 2.0 0.7 - 4.0 K/uL   Monocytes Relative 9 %   Monocytes Absolute 0.6 0.1 - 1.0 K/uL   Eosinophils Relative 2 %   Eosinophils Absolute 0.2 0.0 - 0.5 K/uL   Basophils Relative 1 %   Basophils Absolute 0.1 0.0 - 0.1 K/uL   Immature Granulocytes 0 %   Abs Immature Granulocytes 0.02 0.00 - 0.07 K/uL    Comment: Performed at [facility], [address], [place], [place] [address]  Troponin I (High Sensitivity)     Status: None   Collection Time: 05/22/22  8:10 PM  Result Value Ref Range   Troponin I (High Sensitivity) 6 <18 ng/L    Comment: (NOTE) Elevated high sensitivity troponin I (hsTnI) values and significant  changes across serial measurements may suggest ACS but many other  chronic and acute conditions are known to elevate hsTnI results.  Refer to the "Links" section for chest pain algorithms and additional  guidance. Performed at [facility], [place] [address], [place], [place] [address]   Brain natriuretic peptide     Status: Abnormal   Collection Time: 05/22/22  8:11 PM  Result Value Ref Range   B Natriuretic Peptide 335.8 (H) 0.0 - 100.0 pg/mL     Comment: Performed at [facility] [address] [place], [place] [address]  Resp Panel by RT-PCR (Flu A&B, Covid) Anterior Nasal Swab     Status: None   Collection Time: 05/22/22  8:11 PM   Specimen: Anterior Nasal Swab  Result Value Ref Range   SARS Coronavirus 2 by RT PCR NEGATIVE NEGATIVE  Comment: (NOTE) SARS-CoV-2 target nucleic acids are NOT DETECTED.  The SARS-CoV-2 RNA is generally detectable in upper respiratory specimens during the acute phase of infection. The lowest concentration of SARS-CoV-2 viral copies this assay can detect is 138 copies/mL. A negative result does not preclude SARS-Cov-2 infection and should not be used as the sole basis for treatment or other patient management decisions. A negative result may occur with  improper specimen collection/handling, submission of specimen other than nasopharyngeal swab, presence of viral mutation(s) within the areas targeted by this assay, and inadequate number of viral copies(<138 copies/mL). A negative result must be combined with clinical observations, patient history, and epidemiological information. The expected result is Negative.  Fact Sheet for Patients:  EntrepreneurPulse.com.au  Fact Sheet for Healthcare Providers:  IncredibleEmployment.be  This test is no t yet approved or cleared by the [place] FDA and  has been authorized for detection and/or diagnosis of SARS-CoV-2 by FDA under an Emergency Use Authorization (EUA). This EUA will remain  in effect (meaning this test can be used) for the duration of the COVID-19 declaration under Section 564(b)(1) of the Act, 21 U.S.C.section 360bbb-3(b)(1), unless the authorization is terminated  or revoked sooner.       Influenza A by PCR NEGATIVE NEGATIVE   Influenza B by PCR NEGATIVE NEGATIVE    Comment: (NOTE) The Xpert Xpress SARS-CoV-2/FLU/RSV plus assay is intended as an aid in the diagnosis of influenza from  Nasopharyngeal swab specimens and should not be used as a sole basis for treatment. Nasal washings and aspirates are unacceptable for Xpert Xpress SARS-CoV-2/FLU/RSV testing.  Fact Sheet for Patients: EntrepreneurPulse.com.au  Fact Sheet for Healthcare Providers: IncredibleEmployment.be  This test is not yet approved or cleared by the [place] FDA and has been authorized for detection and/or diagnosis of SARS-CoV-2 by FDA under an Emergency Use Authorization (EUA). This EUA will remain in effect (meaning this test can be used) for the duration of the COVID-19 declaration under Section 564(b)(1) of the Act, 21 U.S.C. section 360bbb-3(b)(1), unless the authorization is terminated or revoked.  Performed at [facility], [place] [address] [place], [place] [address]   I-stat chem 8, ED     Status: Abnormal   Collection Time: 05/22/22  8:27 PM  Result Value Ref Range   Sodium 138 135 - 145 mmol/L   Potassium 4.1 3.5 - 5.1 mmol/L   Chloride 104 98 - 111 mmol/L   BUN 28 (H) 8 - 23 mg/dL   Creatinine, Ser 1.70 (H) 0.44 - 1.00 mg/dL   Glucose, Bld 91 70 - 99 mg/dL    Comment: Glucose reference range applies only to samples taken after fasting for at least 8 hours.   Calcium, Ion 1.09 (L) 1.15 - 1.40 mmol/L   TCO2 22 22 - 32 mmol/L   Hemoglobin 13.3 12.0 - 15.0 g/dL   HCT 39.0 36.0 - 46.0 %   DG Chest Port 1 View  Result Date: 05/22/2022 CLINICAL DATA:  Bradycardia and shortness of breath EXAM: PORTABLE CHEST 1 VIEW COMPARISON:  Chest x-ray dated December 28, 2021 FINDINGS: Patient is rotated to the right. Cardiac and mediastinal contours are unchanged when accounting for exam technique. Elevation of the right hemidiaphragm. Mild basilar opacities, likely due to atelectasis. No large pleural effusion or evidence of pneumothorax. Old left-sided rib fractures. IMPRESSION: Mild basilar opacities, likely due to atelectasis. Electronically Signed   By:  Yetta Glassman M.D.   On: 05/22/2022 20:29    Pending Labs  Unresulted Labs (From admission, onward)     Start     Ordered   05/23/22 0500  Comprehensive metabolic panel  Tomorrow morning,   R        05/22/22 2219   05/23/22 0500  CBC  Tomorrow morning,   R        05/22/22 2219   05/22/22 2220  Magnesium  Once,   R        05/22/22 2219   05/22/22 2220  TSH  Once,   R        05/22/22 2219   05/22/22 2141  Procalcitonin - Baseline  ONCE - URGENT,   URGENT        05/22/22 2141            Vitals/Pain Today's Vitals   05/22/22 2045 05/22/22 2100 05/22/22 2130 05/22/22 2151  BP: (!) 134/57 135/71 (!) 135/90 (!) 145/88  Pulse: 69 70 68 71  Resp: (!) '22 18 13 13  '$ Temp:      TempSrc:      SpO2: 98% 99% (!) 88% 92%  PainSc:        Isolation Precautions No active isolations  Medications Medications  atropine 1 MG/10ML injection (  Not Given 05/22/22 2008)  levothyroxine (SYNTHROID) tablet 200 mcg (has no administration in time range)  apixaban (ELIQUIS) tablet 5 mg (has no administration in time range)  atropine injection 0.4 mg (0.5 mg Intravenous Given 05/22/22 2008)  ondansetron (ZOFRAN) injection 4 mg (4 mg Intravenous Given 05/22/22 2016)  morphine (PF) 4 MG/ML injection 4 mg (4 mg Intravenous Given 05/22/22 2153)    Mobility non-ambulatory     Focused Assessments Cardiac Assessment Handoff:  Cardiac Rhythm: Normal sinus rhythm No results found for: "CKTOTAL", "CKMB", "CKMBINDEX", "TROPONINI" Lab Results  Component Value Date   DDIMER 0.80 (H) 12/08/2021   Does the Patient currently have chest pain? No    R Recommendations: See Admitting Provider Note  Report given to:   Additional Notes:

## 2022-05-22 NOTE — CV Procedure (Signed)
Electrical Cardioversion Procedure Note [name] [id number] Sep 15, 1931  Procedure: Electrical Cardioversion Indications:  Atrial Fibrillation  Procedure Details Consent: Risks of procedure as well as the alternatives and risks of each were explained to the (patient/caregiver).  Consent for procedure obtained. Time Out: Verified patient identification, verified procedure, site/side was marked, verified correct patient position, special equipment/implants available, medications/allergies/relevent history reviewed, required imaging and test results available.  Performed  Patient placed on cardiac monitor, pulse oximetry, supplemental oxygen as necessary.  Sedation given:  propofol Pacer pads placed anterior and posterior chest.  Cardioverted 1 time(s).  Cardioverted at 150J.  Evaluation Findings: Post procedure EKG shows: NSR Complications: None Patient did tolerate procedure well.   [name], MD 05/22/2022, 11:09 AM

## 2022-05-22 NOTE — ED Provider Notes (Addendum)
[place] EMERGENCY DEPARTMENT Provider Note  CSN: [id number] Arrival date & time: 05/22/22 1954  Chief Complaint(s) Bradycardia  HPI [name] is a 88 y.o. female with history of atrial fibrillation, CHF, hyperlipidemia presenting to the emergency department with low heart rate.  Patient recently admitted to the hospital for A-fib .  Had cardioversion today, and was discharged home.  Patient reports that she felt okay after the procedure, on arrival to home had weakness, nausea, short of breath.  EMS arrived and patient was bradycardic to the 20s.  She got 2 1 mg doses of atropine was taken to the emergency department.  The patient denies any chest pain, fevers, chills, abdominal pain.  She reports chronic cough, which is unchanged.  No productive cough.  No leg swelling.   Past Medical History Past Medical History:  Diagnosis Date   Dyspnea    diastolic dysfunction by echo 2012   GERD (gastroesophageal reflux disease)    Hypertension    Mild mitral and aortic regurgitation    Osteoarthritis    Right bundle branch block 2014   Thyroid disease    Patient Active Problem List   Diagnosis Date Noted   Bradycardia 05/22/2022   Persistent atrial fibrillation (HCC)    Acute encephalopathy 12/25/2021   PAF (paroxysmal atrial fibrillation) ([place]) 12/25/2021   HLD (hyperlipidemia) 12/25/2021   UTI (urinary tract infection) 12/24/2021   Class 1 obesity 12/11/2021   Community acquired pneumonia    Acute combined systolic and diastolic heart failure ([place])    Acute CHF (congestive heart failure) ([place]) 12/09/2021   Atrial fibrillation with RVR (HCC) 12/09/2021   Respiratory distress 12/08/2021   Normocytic anemia 12/08/2021   Respiratory distress secondary to acute exacerbation of congestive heart failure ([place]) 12/08/2021   Hyponatremia 12/08/2021   Hypothyroidism 12/08/2021   Essential hypertension 12/08/2021   Home Medication(s) Prior to Admission  medications   Medication Sig Start Date End Date Taking? Authorizing Provider  acetaminophen (TYLENOL) 500 MG tablet Take 1,000 mg by mouth every 6 (six) hours as needed for mild pain.    [provider]  albuterol (VENTOLIN HFA) 108 (90 Base) MCG/ACT inhaler Inhale 1 puff into the lungs every 6 (six) hours as needed for wheezing or shortness of breath. 01/14/22   [provider]  amiodarone (PACERONE) 200 MG tablet Take 1 tablet (200 mg total) by mouth daily. 03/13/22   Loel Dubonnet, NP  apixaban (ELIQUIS) 5 MG TABS tablet Take 1 tablet (5 mg total) by mouth 2 (two) times daily. 01/16/22   [name], MD  benzonatate (TESSALON PERLES) 100 MG capsule Take 1 capsule (100 mg total) by mouth 3 (three) times daily as needed for cough. 01/14/22   Loel Dubonnet, NP  carboxymethylcellulose (REFRESH PLUS) 0.5 % SOLN Place 1 drop into both eyes daily as needed (dry eyes).    [provider]  Cholecalciferol (VITAMIN D) 50 MCG (2000 UT) CAPS Take 2,000 Units by mouth daily.    [provider]  cyanocobalamin (VITAMIN B12) 1000 MCG tablet Take 2,000 mcg by mouth daily.    [provider]  furosemide (LASIX) 20 MG tablet Take '40mg'$  (2 tablets) daily. May take additional '20mg'$  (1 tablet) as needed for weight gain of 2 pounds overnight or 5 pounds in one week 03/13/22   Loel Dubonnet, NP  hydrocortisone cream 1 % Apply 1 Application topically 2 (two) times daily as needed for itching.    [provider]  ketoconazole (NIZORAL) 2 % cream Apply 1 Application topically 2 (two) times daily as needed for rash. 03/04/22   [provider]  levothyroxine (SYNTHROID) 200 MCG tablet Take 1 tablet (200 mcg total) by mouth daily before breakfast. 12/22/21   Domenic Polite, MD  loratadine (CLARITIN) 10 MG tablet Take 10 mg by mouth daily as needed for allergies.    [provider]  LORazepam (ATIVAN) 0.5 MG tablet Take 1 tablet (0.5 mg total) by  mouth daily as needed for anxiety or sleep. 12/25/21   Lavina Hamman, MD  lovastatin (MEVACOR) 40 MG tablet Take 40 mg by mouth every evening.    [provider]  metoprolol succinate (TOPROL-XL) 50 MG 24 hr tablet Take 0.5 tablets (25 mg total) by mouth daily. Take with or immediately following a meal. 05/22/22   [name], MD  naproxen (NAPROSYN) 500 MG tablet Take 500 mg by mouth daily as needed for mild pain.    [provider]  omeprazole (PRILOSEC) 10 MG capsule Take 10 mg by mouth every evening. 10/16/21   [provider]  potassium chloride SA (KLOR-CON M) 20 MEQ tablet Take 1 tablet (20 mEq total) by mouth daily. 01/09/22   [name], MD  sacubitril-valsartan (ENTRESTO) 24-26 MG TAKE 1 TABLET BY MOUTH TWICE A DAY Patient taking differently: Take 1 tablet by mouth 2 (two) times daily. 04/03/22   Isaiah Serge, NP  thiamine (VITAMIN B-1) 50 MG tablet Take 25 mg by mouth daily.    [provider]                                                                                                                                    Past Surgical History Past Surgical History:  Procedure Laterality Date   arm surgery     following an accident   CARDIOVERSION N/A 12/11/2021   Procedure: CARDIOVERSION;  Surgeon: Werner Lean, MD;  Location: [facility];  Service: Cardiovascular;  Laterality: N/A;   KNEE SURGERY     Following an accident   TEE WITHOUT CARDIOVERSION N/A 12/11/2021   Procedure: TRANSESOPHAGEAL ECHOCARDIOGRAM (TEE);  Surgeon: Werner Lean, MD;  Location: [facility] ENDOSCOPY;  Service: Cardiovascular;  Laterality: N/A;   TONSILLECTOMY AND ADENOIDECTOMY     VAGINAL HYSTERECTOMY     Family History Family History  Problem Relation Age of Onset   Heart attack Mother     Social History Social History   Tobacco Use   Smoking status: Never   Smokeless tobacco: Never  Vaping Use   Vaping Use: Never used  Substance Use  Topics   Alcohol use: No   Drug use: No   Allergies Fluoxetine and Serotonin reuptake inhibitors (ssris)  Review of Systems Review of Systems  All other systems reviewed and are negative.   Physical Exam Vital Signs  I have reviewed the triage vital signs BP (!) 140/83  Pulse 67   Temp (!) 97.4 F (36.3 C) (Oral)   Resp 18   SpO2 100%  Physical Exam Vitals and nursing note reviewed.  Constitutional:      General: She is in acute distress.     Appearance: She is well-developed.  HENT:     Head: Normocephalic and atraumatic.     Mouth/Throat:     Mouth: Mucous membranes are moist.  Eyes:     Pupils: Pupils are equal, round, and reactive to light.  Cardiovascular:     Rate and Rhythm: Regular rhythm. Bradycardia present.     Heart sounds: No murmur heard. Pulmonary:     Effort: Pulmonary effort is normal. No respiratory distress.     Breath sounds: Normal breath sounds.  Abdominal:     General: Abdomen is flat.     Palpations: Abdomen is soft.     Tenderness: There is no abdominal tenderness.  Musculoskeletal:        General: No tenderness.     Right lower leg: No edema.     Left lower leg: No edema.  Skin:    General: Skin is warm and dry.  Neurological:     General: No focal deficit present.     Mental Status: She is alert. Mental status is at baseline.  Psychiatric:        Mood and Affect: Mood normal.        Behavior: Behavior normal.     ED Results and Treatments Labs (all labs ordered are listed, but only abnormal results are displayed) Labs Reviewed  COMPREHENSIVE METABOLIC PANEL - Abnormal; Notable for the following components:      Result Value   CO2 21 (*)    BUN 25 (*)    Creatinine, Ser 1.68 (*)    Total Protein 6.3 (*)    AST 51 (*)    GFR, Estimated 29 (*)    All other components within normal limits  BRAIN NATRIURETIC PEPTIDE - Abnormal; Notable for the following components:   B Natriuretic Peptide 335.8 (*)    All other components  within normal limits  I-STAT CHEM 8, ED - Abnormal; Notable for the following components:   BUN 28 (*)    Creatinine, Ser 1.70 (*)    Calcium, Ion 1.09 (*)    All other components within normal limits  RESP PANEL BY RT-PCR (FLU A&B, COVID) ARPGX2  CBC WITH DIFFERENTIAL/PLATELET  MAGNESIUM  PROCALCITONIN  TSH  COMPREHENSIVE METABOLIC PANEL  CBC  TROPONIN I (HIGH SENSITIVITY)  TROPONIN I (HIGH SENSITIVITY)                                                                                                                          Radiology DG Chest Port 1 View  Result Date: 05/22/2022 CLINICAL DATA:  Bradycardia and shortness of breath EXAM: PORTABLE CHEST 1 VIEW COMPARISON:  Chest x-ray dated December 28, 2021 FINDINGS: Patient is rotated to the right. Cardiac and  mediastinal contours are unchanged when accounting for exam technique. Elevation of the right hemidiaphragm. Mild basilar opacities, likely due to atelectasis. No large pleural effusion or evidence of pneumothorax. Old left-sided rib fractures. IMPRESSION: Mild basilar opacities, likely due to atelectasis. Electronically Signed   By: Yetta Glassman M.D.   On: 05/22/2022 20:29    Pertinent labs & imaging results that were available during my care of the patient were reviewed by me and considered in my medical decision making (see MDM for details).  Medications Ordered in ED Medications  atropine 1 MG/10ML injection (  Not Given 05/22/22 2008)  levothyroxine (SYNTHROID) tablet 200 mcg (has no administration in time range)  apixaban (ELIQUIS) tablet 5 mg (has no administration in time range)  atropine injection 0.4 mg (0.5 mg Intravenous Given 05/22/22 2008)  ondansetron (ZOFRAN) injection 4 mg (4 mg Intravenous Given 05/22/22 2016)  morphine (PF) 4 MG/ML injection 4 mg (4 mg Intravenous Given 05/22/22 2153)                                                                                                                                      Procedures .Critical Care  Performed by: Cristie Hem, MD Authorized by: Cristie Hem, MD   Critical care provider statement:    Critical care time (minutes):  30   Critical care was necessary to treat or prevent imminent or life-threatening deterioration of the following conditions:  Cardiac failure, circulatory failure and shock   Critical care was time spent personally by me on the following activities:  Development of treatment plan with patient or surrogate, discussions with consultants, evaluation of patient's response to treatment, examination of patient, ordering and review of laboratory studies, ordering and review of radiographic studies, ordering and performing treatments and interventions, pulse oximetry, re-evaluation of patient's condition and review of old charts   Care discussed with: admitting provider   .1-3 Lead EKG Interpretation  Performed by: Cristie Hem, MD Authorized by: Cristie Hem, MD     Interpretation: abnormal     ECG rate:  35   ECG rate assessment: bradycardic     Rhythm: other rhythm     Ectopy: none     Conduction: abnormal     Abnormal conduction: 3rd degree AV block     (including critical care time)  Medical Decision Making / ED Course   MDM:  88 year old female presenting to the emergency department with bradycardia.  On arrival, patient was bradycardic on the monitor, appeared to be complete heart block.  Patient received 1 additional 0.5 mg dose of atropine, following this, her heart rate returned to normal by the time EKG was obtained which demonstrated to sinus rhythm.  Unclear cause of patient's symptoms.  Doubt ACS, no chest pain.  EKG nonischemic, troponin negative.  Notable for AKI, possibly due to bradycardia.  BNP slightly elevated although lower than previously.  Patient  may have mild CHF exacerbation but unclear without lower extremity edema.  Patient never witnessed to be hypotensive.  Given  bradycardia, recent admission, discussed with the hospitalist who agrees with admission for observation.  Chest x-ray notable for bibasilar atelectasis, low concern for pneumonia without any change in patient's chronic cough, will obtain procalcitonin to further evaluate for infection.  Elevated white blood cell count to raise concern for infection.  Also afebrile.  Clinical Course as of 05/22/22 2249  Thu May 22, 2022  2158 Discussed with hospitalist, who will admit the patient for abnormal HR.  [WS]    Clinical Course User Index [WS] Cristie Hem, MD     Additional history obtained: -Additional history obtained from daughter, EMS -External records from outside source obtained and reviewed including: Chart review including previous notes, labs, imaging, consultation notes   Lab Tests: -I ordered, reviewed, and interpreted labs.   The pertinent results include:   Labs Reviewed  COMPREHENSIVE METABOLIC PANEL - Abnormal; Notable for the following components:      Result Value   CO2 21 (*)    BUN 25 (*)    Creatinine, Ser 1.68 (*)    Total Protein 6.3 (*)    AST 51 (*)    GFR, Estimated 29 (*)    All other components within normal limits  BRAIN NATRIURETIC PEPTIDE - Abnormal; Notable for the following components:   B Natriuretic Peptide 335.8 (*)    All other components within normal limits  I-STAT CHEM 8, ED - Abnormal; Notable for the following components:   BUN 28 (*)    Creatinine, Ser 1.70 (*)    Calcium, Ion 1.09 (*)    All other components within normal limits  RESP PANEL BY RT-PCR (FLU A&B, COVID) ARPGX2  CBC WITH DIFFERENTIAL/PLATELET  MAGNESIUM  PROCALCITONIN  TSH  COMPREHENSIVE METABOLIC PANEL  CBC  TROPONIN I (HIGH SENSITIVITY)  TROPONIN I (HIGH SENSITIVITY)      EKG   EKG Interpretation  Date/Time:  Thursday May 22 2022 20:12:54 EDT Ventricular Rate:  82 PR Interval:  227 QRS Duration: 150 QT Interval:  432 QTC Calculation: 505 R  Axis:   38 Text Interpretation: Sinus rhythm Prolonged PR interval Right bundle branch block Confirmed by Garnette Gunner [phone]) on 05/22/2022 8:16:53 PM         Imaging Studies ordered: I ordered imaging studies including chest x-ray I independently visualized and interpreted imaging. I agree with the radiologist interpretation   Medicines ordered and prescription drug management: Meds ordered this encounter  Medications   atropine injection 0.4 mg   atropine 1 MG/10ML injection    Eliseo Squires, [place]: cabinet override   ondansetron (ZOFRAN) injection 4 mg   morphine (PF) 4 MG/ML injection 4 mg   levothyroxine (SYNTHROID) tablet 200 mcg   apixaban (ELIQUIS) tablet 5 mg    -I have reviewed the patients home medicines and have made adjustments as needed    Consultations Obtained: I requested consultation with the hospitalist,  and discussed lab and imaging findings as well as pertinent plan - they recommend: [place] for further monitoring   Cardiac Monitoring: The patient was maintained on a cardiac monitor.  I personally viewed and interpreted the cardiac monitored which showed an underlying rhythm of: Bradycardia, sinus bradycardia versus complete heart block  Social Determinants of Health:  Factors impacting patients care include: Lives with daughter   Reevaluation: After the interventions noted above, I reevaluated the patient and found that they have :improved  Co morbidities that complicate the patient evaluation  Past Medical History:  Diagnosis Date   Dyspnea    diastolic dysfunction by echo 2012   GERD (gastroesophageal reflux disease)    Hypertension    Mild mitral and aortic regurgitation    Osteoarthritis    Right bundle branch block 2014   Thyroid disease       Dispostion: Admit     Final Clinical Impression(s) / ED Diagnoses Final diagnoses:  Symptomatic bradycardia  Elevated brain natriuretic peptide (BNP) level  Atelectasis      This chart was dictated using voice recognition software.  Despite best efforts to proofread,  errors can occur which can change the documentation meaning.    Cristie Hem, MD 05/22/22 2248    Cristie Hem, MD 05/22/22 2248    Cristie Hem, MD 05/22/22 2249

## 2022-05-22 NOTE — Discharge Instructions (Addendum)
Reduce metoprolol succinate to '25mg'$  daily instead of '50mg'$  daily.  Electrical Cardioversion Electrical cardioversion is the delivery of a jolt of electricity to restore a normal rhythm to the heart. A rhythm that is too fast or is not regular keeps the heart from pumping well. In this procedure, sticky patches or metal paddles are placed on the chest to deliver electricity to the heart from a device. This procedure may be done in an emergency if: There is low or no blood pressure as a result of the heart rhythm. Normal rhythm must be restored as fast as possible to protect the brain and heart from further damage. It may save a life. This may also be a scheduled procedure for irregular or fast heart rhythms that are not immediately life-threatening.  What can I expect after the procedure? Your blood pressure, heart rate, breathing rate, and blood oxygen level will be monitored until you leave the hospital or clinic. Your heart rhythm will be watched to make sure it does not change. You may have some redness on the skin where the shocks were given. Over the counter cortizone cream may be helpful.  Follow these instructions at home: Do not drive for 24 hours if you were given a sedative during your procedure. Take over-the-counter and prescription medicines only as told by your health care provider. Ask your health care provider how to check your pulse. Check it often. Rest for 48 hours after the procedure or as told by your health care provider. Avoid or limit your caffeine use as told by your health care provider. Keep all follow-up visits as told by your health care provider. This is important. Contact a health care provider if: You feel like your heart is beating too quickly or your pulse is not regular. You have a serious muscle cramp that does not go away. Get help right away if: You have discomfort in your chest. You are dizzy or you feel faint. You have trouble breathing or you are  short of breath. Your speech is slurred. You have trouble moving an arm or leg on one side of your body. Your fingers or toes turn cold or blue. Summary Electrical cardioversion is the delivery of a jolt of electricity to restore a normal rhythm to the heart. This procedure may be done right away in an emergency or may be a scheduled procedure if the condition is not an emergency. Generally, this is a safe procedure. After the procedure, check your pulse often as told by your health care provider. This information is not intended to replace advice given to you by your health care provider. Make sure you discuss any questions you have with your health care provider. Document Revised: 04/25/2019 Document Reviewed: 04/25/2019 Elsevier Patient Education  [place].

## 2022-05-22 NOTE — ED Notes (Signed)
Pt on zoll pads

## 2022-05-22 NOTE — ED Notes (Signed)
Verbal order MD [place] for additional 0.5 atropine IV.

## 2022-05-22 NOTE — Transfer of Care (Signed)
Immediate Anesthesia Transfer of Care Note  Patient: [name]  Procedure(s) Performed: CARDIOVERSION  Patient Location: Endoscopy Unit  Anesthesia Type:General  Level of Consciousness: drowsy  Airway & Oxygen Therapy: Patient Spontanous Breathing  Post-op Assessment: Report given to RN and Post -op Vital signs reviewed and stable  Post vital signs: Reviewed and stable  Last Vitals:  Vitals Value Taken Time  BP    Temp    Pulse    Resp    SpO2      Last Pain:  Vitals:   05/22/22 1021  TempSrc: Temporal  PainSc: 0-No pain         Complications: No notable events documented.

## 2022-05-22 NOTE — ED Notes (Signed)
Pt placed on 2L o2 due ot spo2 saturation 87% RA

## 2022-05-22 NOTE — ED Notes (Signed)
XR at bedside

## 2022-05-22 NOTE — Anesthesia Preprocedure Evaluation (Addendum)
Anesthesia Evaluation  Patient identified by MRN, date of birth, ID band Patient awake    Reviewed: Allergy & Precautions, H&P , NPO status , Patient's Chart, lab work & pertinent test results, reviewed documented beta blocker date and time   Airway Mallampati: III  TM Distance: >3 FB Neck ROM: Full    Dental no notable dental hx. (+) Teeth Intact, Dental Advisory Given   Pulmonary shortness of breath, pneumonia,    Pulmonary exam normal breath sounds clear to auscultation       Cardiovascular hypertension, Pt. on medications and Pt. on home beta blockers +CHF  + dysrhythmias Atrial Fibrillation + Valvular Problems/Murmurs AI and MR  Rhythm:Irregular Rate:Tachycardia  Echo 12/11/2021 1. Left ventricular ejection fraction, by estimation, is 45 to 50%. Left ventricular ejection fraction by 3D volume is 46 %. The left ventricle has mildly decreased function. The left ventricle demonstrates global hypokinesis. Left ventricular diastolic function could not be evaluated.  2. Right ventricular systolic function is normal. The right ventricular size is normal.  3. Left atrial size was mildly dilated. No left atrial/left atrial  appendage thrombus was detected.  4. Large pleural effusion in the left lateral region.  5. The mitral valve is grossly normal. Moderate mitral valve regurgitation. No evidence of mitral stenosis.  6. Tricuspid valve regurgitation is mild to moderate.  7. The aortic valve is tricuspid. Aortic valve regurgitation is mild to moderate. No aortic stenosis is present. Aortic valve mean gradient measures 3.0 mmHg.  8. There is mild (Grade II) plaque involving the descending aorta.    Neuro/Psych negative neurological ROS  negative psych ROS   GI/Hepatic Neg liver ROS, GERD  Medicated,  Endo/Other  Hypothyroidism   Renal/GU negative Renal ROS  negative genitourinary   Musculoskeletal  (+) Arthritis ,  Osteoarthritis,    Abdominal   Peds  Hematology  (+) Blood dyscrasia, anemia ,   Anesthesia Other Findings   Reproductive/Obstetrics negative OB ROS                            Anesthesia Physical  Anesthesia Plan  ASA: 3  Anesthesia Plan: General   Post-op Pain Management: Minimal or no pain anticipated   Induction: Intravenous  PONV Risk Score and Plan: 3 and Propofol infusion, Treatment may vary due to age or medical condition and TIVA  Airway Management Planned: Mask  Additional Equipment:   Intra-op Plan:   Post-operative Plan:   Informed Consent: I have reviewed the patients History and Physical, chart, labs and discussed the procedure including the risks, benefits and alternatives for the proposed anesthesia with the patient or authorized representative who has indicated his/her understanding and acceptance.     Dental advisory given  Plan Discussed with: CRNA  Anesthesia Plan Comments:        Anesthesia Quick Evaluation

## 2022-05-22 NOTE — Anesthesia Procedure Notes (Signed)
Procedure Name: General with mask airway Date/Time: 05/22/2022 11:12 AM  Performed by: Imagene Riches, CRNAPre-anesthesia Checklist: Patient identified, Emergency Drugs available, Suction available, Patient being monitored and Timeout performed Patient Re-evaluated:Patient Re-evaluated prior to induction Oxygen Delivery Method: Ambu bag Preoxygenation: Pre-oxygenation with 100% oxygen

## 2022-05-22 NOTE — H&P (Addendum)
History and Physical    [name]:[id number] DOB: 1931/10/04 DOA: 05/22/2022  PCP: Merrilee Seashore, MD  Patient coming from: Home.  Chief Complaint: Bradycardia hypotension.  HPI: [name] is a 88 y.o. female with 3 of chronic systolic CHF, A-fib, hypothyroidism had cardioversion this morning and went home.  After reaching home patient became dizzy weak and when patient's family checked on her she had low blood pressure and low pulses.  EMS was called.  EMS noted that patient was bradycardic with heart rate in the 30s.  On the way to the ER patient received 2 doses of IV atropine 1 mg each.  ED Course: In the ER patient was found to be bradycardic and as per the ER physician patient briefly went into complete heart block was given 0.5 mg of IV atropine following which patient's heart rate and blood pressure improved and patient is being admitted.  Patient also complained of some right groin pain for which morphine was given at the time of my exam patient appeared confused.  MRI brain was unremarkable UA is pending.  Patient admitted for further observation.  Review of Systems: As per HPI, rest all negative.   Past Medical History:  Diagnosis Date   Dyspnea    diastolic dysfunction by echo 2012   GERD (gastroesophageal reflux disease)    Hypertension    Mild mitral and aortic regurgitation    Osteoarthritis    Right bundle branch block 2014   Thyroid disease     Past Surgical History:  Procedure Laterality Date   arm surgery     following an accident   CARDIOVERSION N/A 12/11/2021   Procedure: CARDIOVERSION;  Surgeon: Werner Lean, MD;  Location: [place];  Service: Cardiovascular;  Laterality: N/A;   KNEE SURGERY     Following an accident   TEE WITHOUT CARDIOVERSION N/A 12/11/2021   Procedure: TRANSESOPHAGEAL ECHOCARDIOGRAM (TEE);  Surgeon: Werner Lean, MD;  Location[facility] ENDOSCOPY;  Service: Cardiovascular;  Laterality: N/A;    TONSILLECTOMY AND ADENOIDECTOMY     VAGINAL HYSTERECTOMY       reports that she has never smoked. She has never used smokeless tobacco. She reports that she does not drink alcohol and does not use drugs.  Allergies  Allergen Reactions   Fluoxetine     Other reaction(s): hyponatremia   Serotonin Reuptake Inhibitors (Ssris)     Other reaction(s): hyponatremia    Family History  Problem Relation Age of Onset   Heart attack Mother     Prior to Admission medications   Medication Sig Start Date End Date Taking? Authorizing Provider  acetaminophen (TYLENOL) 500 MG tablet Take 1,000 mg by mouth every 6 (six) hours as needed for mild pain.    [provider]  albuterol (VENTOLIN HFA) 108 (90 Base) MCG/ACT inhaler Inhale 1 puff into the lungs every 6 (six) hours as needed for wheezing or shortness of breath. 01/14/22   [provider]  amiodarone (PACERONE) 200 MG tablet Take 1 tablet (200 mg total) by mouth daily. 03/13/22   Loel Dubonnet, NP  apixaban (ELIQUIS) 5 MG TABS tablet Take 1 tablet (5 mg total) by mouth 2 (two) times daily. 01/16/22   [name], MD  benzonatate (TESSALON PERLES) 100 MG capsule Take 1 capsule (100 mg total) by mouth 3 (three) times daily as needed for cough. 01/14/22   Loel Dubonnet, NP  carboxymethylcellulose (REFRESH PLUS) 0.5 % SOLN Place 1 drop into both eyes daily  as needed (dry eyes).    [provider]  Cholecalciferol (VITAMIN D) 50 MCG (2000 UT) CAPS Take 2,000 Units by mouth daily.    [provider]  cyanocobalamin (VITAMIN B12) 1000 MCG tablet Take 2,000 mcg by mouth daily.    [provider]  furosemide (LASIX) 20 MG tablet Take '40mg'$  (2 tablets) daily. May take additional '20mg'$  (1 tablet) as needed for weight gain of 2 pounds overnight or 5 pounds in one week 03/13/22   Loel Dubonnet, NP  hydrocortisone cream 1 % Apply 1 Application topically 2 (two) times daily as needed for itching.    [provider]  ketoconazole (NIZORAL) 2 % cream Apply 1 Application topically 2 (two) times daily as needed for rash. 03/04/22   [provider]  levothyroxine (SYNTHROID) 200 MCG tablet Take 1 tablet (200 mcg total) by mouth daily before breakfast. 12/22/21   Domenic Polite, MD  loratadine (CLARITIN) 10 MG tablet Take 10 mg by mouth daily as needed for allergies.    [provider]  LORazepam (ATIVAN) 0.5 MG tablet Take 1 tablet (0.5 mg total) by mouth daily as needed for anxiety or sleep. 12/25/21   Lavina Hamman, MD  lovastatin (MEVACOR) 40 MG tablet Take 40 mg by mouth every evening.    [provider]  metoprolol succinate (TOPROL-XL) 50 MG 24 hr tablet Take 0.5 tablets (25 mg total) by mouth daily. Take with or immediately following a meal. 05/22/22   [name], MD  naproxen (NAPROSYN) 500 MG tablet Take 500 mg by mouth daily as needed for mild pain.    [provider]  omeprazole (PRILOSEC) 10 MG capsule Take 10 mg by mouth every evening. 10/16/21   [provider]  potassium chloride SA (KLOR-CON M) 20 MEQ tablet Take 1 tablet (20 mEq total) by mouth daily. 01/09/22   [name], MD  sacubitril-valsartan (ENTRESTO) 24-26 MG TAKE 1 TABLET BY MOUTH TWICE A DAY Patient taking differently: Take 1 tablet by mouth 2 (two) times daily. 04/03/22   Isaiah Serge, NP  thiamine (VITAMIN B-1) 50 MG tablet Take 25 mg by mouth daily.    [provider]    Physical Exam: Constitutional: Moderately built and nourished. Vitals:   05/22/22 2045 05/22/22 2100 05/22/22 2130 05/22/22 2151  BP: (!) 134/57 135/71 (!) 135/90 (!) 145/88  Pulse: 69 70 68 71  Resp: (!) '22 18 13 13  '$ Temp:      TempSrc:      SpO2: 98% 99% (!) 88% 92%   Eyes: Anicteric no pallor. ENMT: No discharge from the ears eyes nose and mouth. Neck: No mass felt.  No neck rigidity. Respiratory: No rhonchi or crepitations. Cardiovascular: S1 S2 heard. Abdomen: Soft  nontender bowel sound present. Musculoskeletal: No edema. Skin: No rash. Neurologic: Patient appears confused oriented to name and place moving all extremities. Psychiatric: Appears confused.   Labs on Admission: I have personally reviewed following labs and imaging studies  CBC: Recent Labs  Lab 05/22/22 2010 05/22/22 2027  WBC 6.8  --   NEUTROABS 3.9  --   HGB 12.4 13.3  HCT 40.2 39.0  MCV 88.0  --   PLT 208  --    Basic Metabolic Panel: Recent Labs  Lab 05/22/22 2010 05/22/22 2027  NA 139 138  K 4.2 4.1  CL 108 104  CO2 21*  --   GLUCOSE 99 91  BUN 25* 28*  CREATININE 1.68* 1.70*  CALCIUM 9.0  --    GFR: Estimated Creatinine Clearance: 26 mL/min (A) (by C-G formula based on SCr of 1.7 mg/dL (H)). Liver Function Tests: Recent Labs  Lab 05/22/22 2010  AST 51*  ALT 43  ALKPHOS 92  BILITOT 0.4  PROT 6.3*  ALBUMIN 3.6   No results for input(s): "LIPASE", "AMYLASE" in the last 168 hours. No results for input(s): "AMMONIA" in the last 168 hours. Coagulation Profile: No results for input(s): "INR", "PROTIME" in the last 168 hours. Cardiac Enzymes: No results for input(s): "CKTOTAL", "CKMB", "CKMBINDEX", "TROPONINI" in the last 168 hours. BNP (last 3 results) No results for input(s): "PROBNP" in the last 8760 hours. HbA1C: No results for input(s): "HGBA1C" in the last 72 hours. CBG: No results for input(s): "GLUCAP" in the last 168 hours. Lipid Profile: No results for input(s): "CHOL", "HDL", "LDLCALC", "TRIG", "CHOLHDL", "LDLDIRECT" in the last 72 hours. Thyroid Function Tests: No results for input(s): "TSH", "T4TOTAL", "FREET4", "T3FREE", "THYROIDAB" in the last 72 hours. Anemia Panel: No results for input(s): "VITAMINB12", "FOLATE", "FERRITIN", "TIBC", "IRON", "RETICCTPCT" in the last 72 hours. Urine analysis:    Component Value Date/Time   COLORURINE YELLOW 12/24/2021 1910   APPEARANCEUR Clear 01/08/2022 1554   LABSPEC 1.007 12/24/2021 1910    PHURINE 7.0 12/24/2021 1910   GLUCOSEU Negative 01/08/2022 1554   HGBUR MODERATE (A) 12/24/2021 1910   BILIRUBINUR Negative 01/08/2022 1554   KETONESUR NEGATIVE 12/24/2021 1910   PROTEINUR Negative 01/08/2022 1554   PROTEINUR 30 (A) 12/24/2021 1910   NITRITE Negative 01/08/2022 1554   NITRITE NEGATIVE 12/24/2021 1910   LEUKOCYTESUR 2+ (A) 01/08/2022 1554   LEUKOCYTESUR LARGE (A) 12/24/2021 1910   Sepsis Labs: '@LABRCNTIP'$ (procalcitonin:4,lacticidven:4) ) Recent Results (from the past 240 hour(s))  Resp Panel by RT-PCR (Flu A&B, Covid) Anterior Nasal Swab     Status: None   Collection Time: 05/22/22  8:11 PM   Specimen: Anterior Nasal Swab  Result Value Ref Range Status   SARS Coronavirus 2 by RT PCR NEGATIVE NEGATIVE Final    Comment: (NOTE) SARS-CoV-2 target nucleic acids are NOT DETECTED.  The SARS-CoV-2 RNA is generally detectable in upper respiratory specimens during the acute phase of infection. The lowest concentration of SARS-CoV-2 viral copies this assay can detect is 138 copies/mL. A negative result does not preclude SARS-Cov-2 infection and should not be used as the sole basis for treatment or other patient management decisions. A negative result may occur with  improper specimen collection/handling, submission of specimen other than nasopharyngeal swab, presence of viral mutation(s) within the areas targeted by this assay, and inadequate number of viral copies(<138 copies/mL). A negative result must be combined with clinical observations, patient history, and epidemiological information. The expected result is Negative.  Fact Sheet for Patients:  EntrepreneurPulse.com.au  Fact Sheet for Healthcare Providers:  IncredibleEmployment.be  This test is no t yet approved or cleared by the [place] FDA and  has been authorized for detection and/or diagnosis of SARS-CoV-2 by FDA under an Emergency Use Authorization (EUA). This EUA  will remain  in effect (meaning this test can be used) for the duration of the COVID-19 declaration under Section 564(b)(1) of the Act, 21 U.S.C.section 360bbb-3(b)(1), unless the authorization is terminated  or revoked sooner.       Influenza A by PCR NEGATIVE NEGATIVE Final   Influenza B by PCR NEGATIVE NEGATIVE Final    Comment: (NOTE) The Xpert Xpress SARS-CoV-2/FLU/RSV plus assay is intended as an aid in the diagnosis of influenza from  Nasopharyngeal swab specimens and should not be used as a sole basis for treatment. Nasal washings and aspirates are unacceptable for Xpert Xpress SARS-CoV-2/FLU/RSV testing.  Fact Sheet for Patients: EntrepreneurPulse.com.au  Fact Sheet for Healthcare Providers: IncredibleEmployment.be  This test is not yet approved or cleared by the [place] FDA and has been authorized for detection and/or diagnosis of SARS-CoV-2 by FDA under an Emergency Use Authorization (EUA). This EUA will remain in effect (meaning this test can be used) for the duration of the COVID-19 declaration under Section 564(b)(1) of the Act, 21 U.S.C. section 360bbb-3(b)(1), unless the authorization is terminated or revoked.  Performed at [facility], [place] [address], [place], [place] [address]      Radiological Exams on Admission: DG Chest Port 1 View  Result Date: 05/22/2022 CLINICAL DATA:  Bradycardia and shortness of breath EXAM: PORTABLE CHEST 1 VIEW COMPARISON:  Chest x-ray dated December 28, 2021 FINDINGS: Patient is rotated to the right. Cardiac and mediastinal contours are unchanged when accounting for exam technique. Elevation of the right hemidiaphragm. Mild basilar opacities, likely due to atelectasis. No large pleural effusion or evidence of pneumothorax. Old left-sided rib fractures. IMPRESSION: Mild basilar opacities, likely due to atelectasis. Electronically Signed   By: Yetta Glassman M.D.   On: 05/22/2022 20:29     EKG: Independently reviewed.  Normal sinus rhythm RBBB.  Assessment/Plan Principal Problem:   Bradycardia Active Problems:   Hypothyroidism   PAF (paroxysmal atrial fibrillation) (HCC)    Bradycardia and hypotension -patient had a cardioversion this morning.  Per ER physician patient briefly was in complete heart block though we do not have any rhythm strips.  Patient has totally received 2.5 mg of IV atropine presently in sinus rhythm blood pressure more than 824 systolic.  For now I am going to hold off patient's amiodarone beta-blockers and antihypertensives.  Gently hydrate and closely monitor.  Check TSH and cardiac markers. Acute encephalopathy could be from the pain medication patient received.  MRI brain unremarkable UA is pending.  Continue to monitor. Acute renal failure -likely from hypotension and patient being on ARB.  Holding [place] for now gently hydrating.  Closely monitor since patient previously has had hyponatremia. Hypothyroidism on Synthroid -check TSH given the bradycardia. History of systolic CHF presently receiving gentle hydration holding Entresto and beta-blockers due to hypotension. A-fib on Eliquis underwent cardioversion this morning.  Holding beta-blockers due to bradycardia at presentation.   DVT prophylaxis: Eliquis. Code Status: Full code. Family Communication: Patient's daughter. Disposition Plan: Home. Consults called: We will need to notify cardiology. Admission status: Observation.   [name] MD [facility] Pager [phone].  If 7PM-7AM, please contact night-coverage [email or web address] Password [email or web address]  05/22/2022, 10:20 PM

## 2022-05-23 ENCOUNTER — Observation Stay (HOSPITAL_COMMUNITY): Payer: Medicare HMO

## 2022-05-23 DIAGNOSIS — N183 Chronic kidney disease, stage 3 unspecified: Secondary | ICD-10-CM

## 2022-05-23 DIAGNOSIS — N189 Chronic kidney disease, unspecified: Secondary | ICD-10-CM | POA: Diagnosis not present

## 2022-05-23 DIAGNOSIS — G9341 Metabolic encephalopathy: Secondary | ICD-10-CM | POA: Diagnosis not present

## 2022-05-23 DIAGNOSIS — E785 Hyperlipidemia, unspecified: Secondary | ICD-10-CM | POA: Diagnosis not present

## 2022-05-23 DIAGNOSIS — Z90710 Acquired absence of both cervix and uterus: Secondary | ICD-10-CM | POA: Diagnosis not present

## 2022-05-23 DIAGNOSIS — I4819 Other persistent atrial fibrillation: Secondary | ICD-10-CM

## 2022-05-23 DIAGNOSIS — I080 Rheumatic disorders of both mitral and aortic valves: Secondary | ICD-10-CM | POA: Diagnosis not present

## 2022-05-23 DIAGNOSIS — I442 Atrioventricular block, complete: Secondary | ICD-10-CM | POA: Diagnosis not present

## 2022-05-23 DIAGNOSIS — M1990 Unspecified osteoarthritis, unspecified site: Secondary | ICD-10-CM | POA: Diagnosis not present

## 2022-05-23 DIAGNOSIS — N179 Acute kidney failure, unspecified: Secondary | ICD-10-CM | POA: Diagnosis not present

## 2022-05-23 DIAGNOSIS — E039 Hypothyroidism, unspecified: Secondary | ICD-10-CM | POA: Diagnosis not present

## 2022-05-23 DIAGNOSIS — R102 Pelvic and perineal pain: Secondary | ICD-10-CM | POA: Diagnosis not present

## 2022-05-23 DIAGNOSIS — I959 Hypotension, unspecified: Secondary | ICD-10-CM | POA: Diagnosis not present

## 2022-05-23 DIAGNOSIS — I495 Sick sinus syndrome: Secondary | ICD-10-CM | POA: Diagnosis not present

## 2022-05-23 DIAGNOSIS — J9811 Atelectasis: Secondary | ICD-10-CM | POA: Diagnosis not present

## 2022-05-23 DIAGNOSIS — I480 Paroxysmal atrial fibrillation: Secondary | ICD-10-CM | POA: Diagnosis not present

## 2022-05-23 DIAGNOSIS — N390 Urinary tract infection, site not specified: Secondary | ICD-10-CM | POA: Diagnosis not present

## 2022-05-23 DIAGNOSIS — B961 Klebsiella pneumoniae [K. pneumoniae] as the cause of diseases classified elsewhere: Secondary | ICD-10-CM | POA: Diagnosis not present

## 2022-05-23 DIAGNOSIS — R001 Bradycardia, unspecified: Secondary | ICD-10-CM | POA: Diagnosis not present

## 2022-05-23 DIAGNOSIS — E669 Obesity, unspecified: Secondary | ICD-10-CM | POA: Diagnosis not present

## 2022-05-23 DIAGNOSIS — R29818 Other symptoms and signs involving the nervous system: Secondary | ICD-10-CM | POA: Diagnosis not present

## 2022-05-23 DIAGNOSIS — E871 Hypo-osmolality and hyponatremia: Secondary | ICD-10-CM | POA: Diagnosis not present

## 2022-05-23 DIAGNOSIS — J90 Pleural effusion, not elsewhere classified: Secondary | ICD-10-CM | POA: Diagnosis not present

## 2022-05-23 DIAGNOSIS — Z8249 Family history of ischemic heart disease and other diseases of the circulatory system: Secondary | ICD-10-CM | POA: Diagnosis not present

## 2022-05-23 DIAGNOSIS — I130 Hypertensive heart and chronic kidney disease with heart failure and stage 1 through stage 4 chronic kidney disease, or unspecified chronic kidney disease: Secondary | ICD-10-CM | POA: Diagnosis not present

## 2022-05-23 DIAGNOSIS — Z66 Do not resuscitate: Secondary | ICD-10-CM | POA: Diagnosis not present

## 2022-05-23 DIAGNOSIS — K219 Gastro-esophageal reflux disease without esophagitis: Secondary | ICD-10-CM | POA: Diagnosis not present

## 2022-05-23 DIAGNOSIS — I5023 Acute on chronic systolic (congestive) heart failure: Secondary | ICD-10-CM | POA: Diagnosis not present

## 2022-05-23 DIAGNOSIS — Z20822 Contact with and (suspected) exposure to covid-19: Secondary | ICD-10-CM | POA: Diagnosis not present

## 2022-05-23 DIAGNOSIS — I77810 Thoracic aortic ectasia: Secondary | ICD-10-CM | POA: Diagnosis not present

## 2022-05-23 DIAGNOSIS — Z888 Allergy status to other drugs, medicaments and biological substances status: Secondary | ICD-10-CM | POA: Diagnosis not present

## 2022-05-23 DIAGNOSIS — N3000 Acute cystitis without hematuria: Secondary | ICD-10-CM | POA: Diagnosis not present

## 2022-05-23 DIAGNOSIS — N1832 Chronic kidney disease, stage 3b: Secondary | ICD-10-CM | POA: Diagnosis not present

## 2022-05-23 LAB — GLUCOSE, CAPILLARY
Glucose-Capillary: 87 mg/dL (ref 70–99)
Glucose-Capillary: 87 mg/dL (ref 70–99)
Glucose-Capillary: 118 mg/dL — ABNORMAL HIGH (ref 70–99)

## 2022-05-23 LAB — COMPREHENSIVE METABOLIC PANEL
ALT: 38 U/L (ref 0–44)
AST: 41 U/L (ref 15–41)
Albumin: 3.5 g/dL (ref 3.5–5.0)
Alkaline Phosphatase: 85 U/L (ref 38–126)
Anion gap: 10 (ref 5–15)
BUN: 25 mg/dL — ABNORMAL HIGH (ref 8–23)
CO2: 22 mmol/L (ref 22–32)
Calcium: 8.7 mg/dL — ABNORMAL LOW (ref 8.9–10.3)
Chloride: 100 mmol/L (ref 98–111)
Creatinine, Ser: 1.76 mg/dL — ABNORMAL HIGH (ref 0.44–1.00)
GFR, Estimated: 27 mL/min — ABNORMAL LOW (ref >60)
Glucose, Bld: 103 mg/dL — ABNORMAL HIGH (ref 70–99)
Potassium: 4.4 mmol/L (ref 3.5–5.1)
Sodium: 132 mmol/L — ABNORMAL LOW (ref 135–145)
Total Bilirubin: 0.5 mg/dL (ref 0.3–1.2)
Total Protein: 6.4 g/dL — ABNORMAL LOW (ref 6.5–8.1)

## 2022-05-23 LAB — URINALYSIS, ROUTINE W REFLEX MICROSCOPIC
Bilirubin Urine: NEGATIVE
Glucose, UA: NEGATIVE mg/dL
Ketones, ur: NEGATIVE mg/dL
Nitrite: NEGATIVE
Protein, ur: 100 mg/dL — AB
Specific Gravity, Urine: 1.015 (ref 1.005–1.030)
WBC, UA: >50 WBC/hpf — ABNORMAL HIGH (ref 0–5)
pH: 5 (ref 5.0–8.0)

## 2022-05-23 LAB — CBC
HCT: 37.9 % (ref 36.0–46.0)
Hemoglobin: 12.4 g/dL (ref 12.0–15.0)
MCH: 27.7 pg (ref 26.0–34.0)
MCHC: 32.7 g/dL (ref 30.0–36.0)
MCV: 84.8 fL (ref 80.0–100.0)
Platelets: 175 10*3/uL (ref 150–400)
RBC: 4.47 MIL/uL (ref 3.87–5.11)
RDW: 14.4 % (ref 11.5–15.5)
WBC: 7.5 10*3/uL (ref 4.0–10.5)
nRBC: 0 % (ref 0.0–0.2)

## 2022-05-23 LAB — PROCALCITONIN: Procalcitonin: <0.1 ng/mL

## 2022-05-23 LAB — TSH: TSH: 13.968 u[IU]/mL — ABNORMAL HIGH (ref 0.350–4.500)

## 2022-05-23 LAB — T4, FREE: Free T4: 1.4 ng/dL — ABNORMAL HIGH (ref 0.61–1.12)

## 2022-05-23 MED ORDER — PRAVASTATIN SODIUM 40 MG PO TABS
40.0000 mg | ORAL_TABLET | Freq: Every day | ORAL | Status: DC
Start: 2022-05-23 — End: 2022-05-28
  Administered 2022-05-23 – 2022-05-27 (×5): 40 mg via ORAL
  Filled 2022-05-23 (×5): qty 1

## 2022-05-23 MED ORDER — LORAZEPAM 2 MG/ML IJ SOLN
0.5000 mg | Freq: Once | INTRAMUSCULAR | Status: AC
Start: 2022-05-23 — End: 2022-05-23
  Administered 2022-05-23: 0.5 mg via INTRAVENOUS
  Filled 2022-05-23: qty 1

## 2022-05-23 MED ORDER — APIXABAN 2.5 MG PO TABS
2.5000 mg | ORAL_TABLET | Freq: Two times a day (BID) | ORAL | Status: DC
  Administered 2022-05-23 – 2022-05-26 (×6): 2.5 mg via ORAL
  Filled 2022-05-23 (×6): qty 1

## 2022-05-23 MED ORDER — LEVOTHYROXINE SODIUM 75 MCG PO TABS
225.0000 ug | ORAL_TABLET | Freq: Every day | ORAL | Status: DC
  Administered 2022-05-24 – 2022-05-28 (×5): 225 ug via ORAL
  Filled 2022-05-23 (×5): qty 3

## 2022-05-23 MED ORDER — SODIUM CHLORIDE 0.9 % IV SOLN
1.0000 g | INTRAVENOUS | Status: DC
  Administered 2022-05-23 – 2022-05-25 (×3): 1 g via INTRAVENOUS
  Filled 2022-05-23 (×3): qty 10

## 2022-05-23 NOTE — Progress Notes (Signed)
Bladder scan complete 65m

## 2022-05-23 NOTE — Progress Notes (Signed)
  Transition of Care (TOC) Screening Note   Patient Details  Name: [name] Date of Birth: Oct 11, 1930   Transition of Care [facility]) CM/SW Contact:    Bethann Berkshire, [place] Phone Number: 05/23/2022, 10:09 AM    Transition of Care Department [facility]) has reviewed patient and no TOC needs have been identified at this time. We will continue to monitor patient advancement through interdisciplinary progression rounds. If new patient transition needs arise, please place a TOC consult.

## 2022-05-23 NOTE — Progress Notes (Addendum)
PROGRESS NOTE    [name]  NWG:[id number] DOB: [birth year] DOA: 05/22/2022 PCP: Merrilee Seashore, MD  88/F with history of chronic systolic CHF, A-fib, hypothyroidism, mild memory loss, obesity, hypothyroidism underwent DC cardioversion yesterday morning, subsequently went home took a nap, woke up was extremely dizzy and weak her pulse was in the 30s and blood pressure in the 90s range, EMS was called she was noted to be bradycardic in the 30s, treated with 2 doses of atropine.  Per EDP some mention of complete heart block was given another dose of IV atropine, and admitted overnight. -Received 2 doses of Ativan and morphine-for some groin discomfort, noted to be more confused, EDP obtained MRI brain which was unremarkable, UA pending   Subjective: Confusion overnight, picking at things, BP stable, heart rate in the 50s  Assessment and Plan:  Bradycardia and hypotension -Following cardioversion 8/17 -Metoprolol and amiodarone on hold, could resume amiodarone, will discuss with cards -Discontinue IV fluids, BP stable now  Paroxysmal atrial fibrillation -Continue Eliquis, cardioversion yesterday, in sinus rhythm now -Beta-blocker on hold  Encephalopathy, delirium -In the background of mild memory deficits, likely secondary to bradycardia hypotension and benzo/morphine received overnight -Also had an MRI brain in the ED yesterday which was unremarkable, UA is pending -Avoid sedating meds -Monitor clinically  Chronic systolic CHF -EF 57%, clinically appears euvolemic, DC IV fluids, holding Entresto and beta-blocker today  Mild AKI -Likely secondary to hypotension, holding Entresto today  Hypothyroidism -On Synthroid, TSH is significantly elevated, per daughter patient was taking Synthroid along with a.m. meds for a while until she she changed this a month ago, will increase dose and recommend recheck TSH in 6 weeks   DVT prophylaxis: Apixaban Code Status: Full  code Family Communication: Discussed with daughter at bedside Disposition Plan: Home pending improvement in mental status  Consultants: Cards   Procedures:   Antimicrobials:    Objective: Vitals:   05/23/22 0500 05/23/22 0800 05/23/22 0830 05/23/22 0900  BP: (!) 147/110  (!) 141/56   Pulse: 61 (!) 55 (!) 55 (!) 55  Resp: 18 17 (!) 25 15  Temp: 97.8 F (36.6 C)  97.8 F (36.6 C)   TempSrc: Oral  Oral   SpO2: 99% 98% 99% 97%  Weight:      Height:        Intake/Output Summary (Last 24 hours) at 05/23/2022 1011 Last data filed at 05/23/2022 0900 Gross per 24 hour  Intake 139.63 ml  Output 50 ml  Net 89.63 ml   Filed Weights   05/23/22 0016 05/23/22 0036  Weight: 93 kg 93.1 kg    Examination:  General exam: Obese elderly female sitting up in bed, somnolent but arousable, oriented to self and place only, cognitive deficits noted, picking at oxygen tubing CVS: S1-S2, regular rhythm Lungs: Decreased breath sounds at the bases otherwise clear Abdomen: Soft, obese, nontender, bowel sounds present Extremities: No edema Neuro: Moves all extremities, no localizing signs Skin: No rashes Psychiatry:  Mood & affect appropriate.     Data Reviewed:   CBC: Recent Labs  Lab 05/22/22 2010 05/22/22 2027 05/23/22 0155  WBC 6.8  --  7.5  NEUTROABS 3.9  --   --   HGB 12.4 13.3 12.4  HCT 40.2 39.0 37.9  MCV 88.0  --  84.8  PLT 208  --  846   Basic Metabolic Panel: Recent Labs  Lab 05/22/22 2010 05/22/22 2027 05/22/22 2201 05/23/22 0155  NA 139 138  --  132*  K 4.2 4.1  --  4.4  CL 108 104  --  100  CO2 21*  --   --  22  GLUCOSE 99 91  --  103*  BUN 25* 28*  --  25*  CREATININE 1.68* 1.70*  --  1.76*  CALCIUM 9.0  --   --  8.7*  MG  --   --  2.0  --    GFR: Estimated Creatinine Clearance: 25.4 mL/min (A) (by C-G formula based on SCr of 1.76 mg/dL (H)). Liver Function Tests: Recent Labs  Lab 05/22/22 2010 05/23/22 0155  AST 51* 41  ALT 43 38  ALKPHOS 92  85  BILITOT 0.4 0.5  PROT 6.3* 6.4*  ALBUMIN 3.6 3.5   No results for input(s): "LIPASE", "AMYLASE" in the last 168 hours. No results for input(s): "AMMONIA" in the last 168 hours. Coagulation Profile: No results for input(s): "INR", "PROTIME" in the last 168 hours. Cardiac Enzymes: No results for input(s): "CKTOTAL", "CKMB", "CKMBINDEX", "TROPONINI" in the last 168 hours. BNP (last 3 results) No results for input(s): "PROBNP" in the last 8760 hours. HbA1C: No results for input(s): "HGBA1C" in the last 72 hours. CBG: Recent Labs  Lab 05/22/22 2352 05/23/22 0606  GLUCAP 103* 87   Lipid Profile: No results for input(s): "CHOL", "HDL", "LDLCALC", "TRIG", "CHOLHDL", "LDLDIRECT" in the last 72 hours. Thyroid Function Tests: Recent Labs    05/23/22 0155  TSH 13.968*   Anemia Panel: No results for input(s): "VITAMINB12", "FOLATE", "FERRITIN", "TIBC", "IRON", "RETICCTPCT" in the last 72 hours. Urine analysis:    Component Value Date/Time   COLORURINE YELLOW 12/24/2021 1910   APPEARANCEUR Clear 01/08/2022 1554   LABSPEC 1.007 12/24/2021 1910   PHURINE 7.0 12/24/2021 1910   GLUCOSEU Negative 01/08/2022 1554   HGBUR MODERATE (A) 12/24/2021 1910   BILIRUBINUR Negative 01/08/2022 1554   KETONESUR NEGATIVE 12/24/2021 1910   PROTEINUR Negative 01/08/2022 1554   PROTEINUR 30 (A) 12/24/2021 1910   NITRITE Negative 01/08/2022 1554   NITRITE NEGATIVE 12/24/2021 1910   LEUKOCYTESUR 2+ (A) 01/08/2022 1554   LEUKOCYTESUR LARGE (A) 12/24/2021 1910   Sepsis Labs: '@LABRCNTIP'$ (procalcitonin:4,lacticidven:4)  ) Recent Results (from the past 240 hour(s))  Resp Panel by RT-PCR (Flu A&B, Covid) Anterior Nasal Swab     Status: None   Collection Time: 05/22/22  8:11 PM   Specimen: Anterior Nasal Swab  Result Value Ref Range Status   SARS Coronavirus 2 by RT PCR NEGATIVE NEGATIVE Final    Comment: (NOTE) SARS-CoV-2 target nucleic acids are NOT DETECTED.  The SARS-CoV-2 RNA is generally  detectable in upper respiratory specimens during the acute phase of infection. The lowest concentration of SARS-CoV-2 viral copies this assay can detect is 138 copies/mL. A negative result does not preclude SARS-Cov-2 infection and should not be used as the sole basis for treatment or other patient management decisions. A negative result may occur with  improper specimen collection/handling, submission of specimen other than nasopharyngeal swab, presence of viral mutation(s) within the areas targeted by this assay, and inadequate number of viral copies(<138 copies/mL). A negative result must be combined with clinical observations, patient history, and epidemiological information. The expected result is Negative.  Fact Sheet for Patients:  EntrepreneurPulse.com.au  Fact Sheet for Healthcare Providers:  IncredibleEmployment.be  This test is no t yet approved or cleared by the [place] FDA and  has been authorized for detection and/or diagnosis of SARS-CoV-2 by FDA under an Emergency Use Authorization (EUA). This EUA will remain  in effect (meaning this test can be used) for the duration of the COVID-19 declaration under Section 564(b)(1) of the Act, 21 U.S.C.section 360bbb-3(b)(1), unless the authorization is terminated  or revoked sooner.       Influenza A by PCR NEGATIVE NEGATIVE Final   Influenza B by PCR NEGATIVE NEGATIVE Final    Comment: (NOTE) The Xpert Xpress SARS-CoV-2/FLU/RSV plus assay is intended as an aid in the diagnosis of influenza from Nasopharyngeal swab specimens and should not be used as a sole basis for treatment. Nasal washings and aspirates are unacceptable for Xpert Xpress SARS-CoV-2/FLU/RSV testing.  Fact Sheet for Patients: EntrepreneurPulse.com.au  Fact Sheet for Healthcare Providers: IncredibleEmployment.be  This test is not yet approved or cleared by the [place] FDA  and has been authorized for detection and/or diagnosis of SARS-CoV-2 by FDA under an Emergency Use Authorization (EUA). This EUA will remain in effect (meaning this test can be used) for the duration of the COVID-19 declaration under Section 564(b)(1) of the Act, 21 U.S.C. section 360bbb-3(b)(1), unless the authorization is terminated or revoked.  Performed at [facility], [place] [address] [place], [place] [address]      Radiology Studies: DG Pelvis Portable  Result Date: 05/23/2022 CLINICAL DATA:  88 year old female with confusion and pain. EXAM: PORTABLE PELVIS 1-2 VIEWS COMPARISON:  Fluoroscopic right hip injection 09/11/2010. FINDINGS: Portable AP supine view at 0630 hours. Femoral heads are normally located. Pelvis appears intact. SI joints and symphysis within normal limits. Grossly intact proximal femurs. Partially visible lumbar disc and endplate degeneration. No acute osseous abnormality identified. Visible bowel gas pattern is within normal limits. IMPRESSION: No acute osseous abnormality identified about the pelvis. If there is lateralizing hip pain recommend dedicated hip series. Electronically Signed   By: Genevie Ann M.D.   On: 05/23/2022 07:08   MR BRAIN WO CONTRAST  Result Date: 05/23/2022 CLINICAL DATA:  Acute neurologic deficit EXAM: MRI HEAD WITHOUT CONTRAST TECHNIQUE: Multiplanar, multiecho pulse sequences of the brain and surrounding structures were obtained without intravenous contrast. COMPARISON:  None Available. FINDINGS: Brain: No acute infarct, mass effect or extra-axial collection. No acute or chronic hemorrhage. Normal white matter signal. Generalized volume loss. 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 Midline structures are normal. Vascular: Major flow voids are preserved. Skull and upper cervical spine: Normal calvarium and skull base. Visualized upper cervical spine and soft tissues are normal. Sinuses/Orbits:No paranasal sinus fluid levels or advanced  mucosal thickening. No mastoid or middle ear effusion. Normal orbits. IMPRESSION: 1. No acute intracranial abnormality. 2. 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 Electronically Signed   By: Ulyses Jarred M.D.   On: 05/23/2022 02:02   DG Chest Port 1 View  Result Date: 05/22/2022 CLINICAL DATA:  Bradycardia and shortness of breath EXAM: PORTABLE CHEST 1 VIEW COMPARISON:  Chest x-ray dated December 28, 2021 FINDINGS: Patient is rotated to the right. Cardiac and mediastinal contours are unchanged when accounting for exam technique. Elevation of the right hemidiaphragm. Mild basilar opacities, likely due to atelectasis. No large pleural effusion or evidence of pneumothorax. Old left-sided rib fractures. IMPRESSION: Mild basilar opacities, likely due to atelectasis. Electronically Signed   By: Yetta Glassman M.D.   On: 05/22/2022 20:29     Scheduled Meds:  apixaban  5 mg Oral BID   levothyroxine  200 mcg Oral Q0600   Continuous Infusions:   LOS: 0 days    Time spent: 56mn    PDomenic Polite MD [facility]  05/23/2022, 10:11 AM

## 2022-05-23 NOTE — Anesthesia Postprocedure Evaluation (Signed)
Anesthesia Post Note  Patient: [name]  Procedure(s) Performed: CARDIOVERSION     Patient location during evaluation: PACU Anesthesia Type: General Level of consciousness: sedated and patient cooperative Pain management: pain level controlled Vital Signs Assessment: post-procedure vital signs reviewed and stable Respiratory status: spontaneous breathing Cardiovascular status: stable Anesthetic complications: no   No notable events documented.  Last Vitals:  Vitals:   05/22/22 1120 05/22/22 1130  BP: 102/81 128/67  Pulse: (!) 47 (!) 50  Resp: 14 11  Temp:    SpO2: 96% 96%    Last Pain:  Vitals:   05/22/22 1130  TempSrc:   PainSc: 0-No pain                 Nolon Nations

## 2022-05-23 NOTE — Consult Note (Addendum)
Cardiology Consultation:   Patient ID: [name] MRN: [id number]; DOB: Feb 15, 1931  Admit date: 05/22/2022 Date of Consult: 05/23/2022  PCP:  Merrilee Seashore, MD   [facility] Providers Cardiologist:  [name], MD   Patient Profile:   [name] is a 88 y.o. 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 Broadus John.  History of Present Illness:   [name] is a 88 year old female with the above history who is followed by Dr. Oval Linsey.  Patient was admitted in 12/2021 with new onset atrial fibrillation and CHF after presenting with shortness of breath and volume overload.  Echo showed LVEF of 45% with moderate MR.  She was diuresed 12 L and then underwent TEE/DCCV with quick return to atrial fibrillation.  Initial plan following this was for rate control and she was started on Amiodarone and GDMT for CHF.  Since then, there have been multiple phone conversations and visit due to concern for shortness of breath, edema, and cough. She was last seen by Laurann Montana, NP, on 05/16/2022 at which time patient reported stable exertional dyspnea as well as shortness of breath first thing in the morning that improves with albuterol. She denied any chest pain. She was still in atrial fibrillation and decision was made to proceed with repeat DCCV.    Patient presented for DCCV on 05/22/2022 and was successfully cardioverted to normal sinus rhythm.  She initially tolerated procedure well but later that evening started to have intermittent dizziness and daughter noted heart rate/BP was low.  EMS was called and she was found to be bradycardic with irregular rhythm and heart rate of 28 bpm with initial BP reportedly of 90/60.  She  was given '1mg'$  of Atropine with improvement and then required a second dose of Atropine given heart rates in the 30s. Upon arrival to the ED,  EKG shows like sinus bradycardia, rate 42 bpm, with what looks like second-degree type I AV block (slightly prolonged PR that are dropped beat). Per ED provider, patient reportedly briefly went into complete heart block and was given another 0.'5mg'$  of Atropine and HR and BP improved (I do not see any evidence of complete heart block on telemetry).  High-sensitivity troponin negative x2.  BNP elevated at 335.  Chest x-ray showed mild basilar opacities likely due to atelectasis. CBC 6.8, Hgb 12.4, Plts 208. Na 139, K 4.2, Glucose 99, BUN 25, Cr 1.68 (baseline around 1.1 to 1.2). Procalcitonin negative.  Brain MRI ordered as patient seemed confused and showed no acute abnormalities. Patient was admitted and home Amiodarone, Toprol-XL, and Entresto were held. Cardiology consulted for further evaluation.   At the time of this evaluation, patient is resting comfortably in no acute distress. She is currently in sinus bradycardia with rates in the 50s and BP is stable.  Patient states she actually was feeling dizzy on the car ride home from her DCCV yesterday but today initially thought it was just due to not having anything to eat or much to drink since the night before.  However, he continued to have intermittent dizziness off and on throughout the day.  Daughter checked her vital signs and noticed that both her BP and heart rate were low heart rate dropped into the 57Q and systolic BP would be in the  90s.  This is when family decided to call 911.  Otherwise, she was feeling at her baseline yesterday after DCCV.  She has chronic shortness of breath with activity as well as shortness of breath first thing in the morning that improves with albuterol but this is a chronic issue and unchanged from her baseline.  Does not sound like she has any real orthopnea or PND.  She has  intermittent lower extremity swelling but this is currently well controlled.  She denies any chest pain.  No syncope with event yesterday.  She denies any recent fevers or illnesses.  She does have a chronic cough which she has had since before her admission in 12/2021.  She also has chronic nasal drip but this is stable.  She has chronic diarrhea to be correlated to certain foods that she eats but no other GI symptoms.  No abnormal bleeding in urine or stools.  Of note, she is still confused and does not answer all questions appropriately.  Daughter is concerned that she has a UTI.  Urinalysis today showed large leukocytes, greater than 50 WBC, and many impact to area but nitrate negative.  Urine culture is pending but she has been started on Rocephin.  Past Medical History:  Diagnosis Date   Dyspnea    diastolic dysfunction by echo 2012   GERD (gastroesophageal reflux disease)    Hypertension    Mild mitral and aortic regurgitation    Osteoarthritis    Right bundle branch block 2014   Thyroid disease     Past Surgical History:  Procedure Laterality Date   arm surgery     following an accident   CARDIOVERSION N/A 12/11/2021   Procedure: CARDIOVERSION;  Surgeon: Werner Lean, MD;  Location: [facility];  Service: Cardiovascular;  Laterality: N/A;   KNEE SURGERY     Following an accident   TEE WITHOUT CARDIOVERSION N/A 12/11/2021   Procedure: TRANSESOPHAGEAL ECHOCARDIOGRAM (TEE);  Surgeon: Werner Lean, MD;  Location: [facility] ENDOSCOPY;  Service: Cardiovascular;  Laterality: N/A;   TONSILLECTOMY AND ADENOIDECTOMY     VAGINAL HYSTERECTOMY       Home Medications:  Prior to Admission medications   Medication Sig Start Date End Date Taking? Authorizing Provider  acetaminophen (TYLENOL) 500 MG tablet Take 1,000 mg by mouth every 6 (six) hours as needed for mild pain.   Yes [provider]  albuterol (VENTOLIN HFA) 108 (90 Base) MCG/ACT inhaler Inhale 1 puff into the  lungs every 6 (six) hours as needed for wheezing or shortness of breath. 01/14/22  Yes [provider]  amiodarone (PACERONE) 200 MG tablet Take 1 tablet (200 mg total) by mouth daily. 03/13/22  Yes Loel Dubonnet, NP  apixaban (ELIQUIS) 5 MG TABS tablet Take 1 tablet (5 mg total) by mouth 2 (two) times daily. 01/16/22  Yes [name], MD  benzonatate (TESSALON PERLES) 100 MG capsule Take 1 capsule (100 mg total) by mouth 3 (three) times daily as needed for cough. 01/14/22  Yes Loel Dubonnet, NP  Cholecalciferol (VITAMIN D) 50 MCG (2000 UT) CAPS Take 2,000 Units by mouth daily.   Yes [provider]  cyanocobalamin (VITAMIN B12) 1000 MCG tablet Take 2,000 mcg by mouth daily.   Yes [provider]  furosemide (LASIX) 20 MG tablet Take '40mg'$  (2 tablets) daily. May take additional '20mg'$  (1 tablet) as needed for weight gain of 2 pounds overnight or 5 pounds in one week 03/13/22  Yes Loel Dubonnet, NP  hydrocortisone cream 1 % Apply 1 Application topically 2 (two) times daily as needed for itching.   Yes [provider]  ketoconazole (NIZORAL) 2 % cream Apply 1 Application topically 2 (two) times daily as needed for rash. 03/04/22  Yes [provider]  levothyroxine (SYNTHROID) 200 MCG tablet Take 1 tablet (200 mcg total) by mouth daily before breakfast. 12/22/21  Yes Domenic Polite, MD  loratadine (CLARITIN) 10 MG tablet Take 10 mg by mouth daily as needed for allergies.   Yes [provider]  LORazepam (ATIVAN) 0.5 MG tablet Take 1 tablet (0.5 mg total) by mouth daily as needed for anxiety or sleep. 12/25/21  Yes Lavina Hamman, MD  lovastatin (MEVACOR) 40 MG tablet Take 40 mg by mouth every evening.   Yes [provider]  metoprolol succinate (TOPROL-XL) 50 MG 24 hr tablet Take 0.5 tablets (25 mg total) by mouth daily. Take with or immediately following a meal. 05/22/22  Yes [name], MD  omeprazole (PRILOSEC) 10 MG capsule  Take 10 mg by mouth every evening. 10/16/21  Yes [provider]  potassium chloride SA (KLOR-CON M) 20 MEQ tablet Take 1 tablet (20 mEq total) by mouth daily. 01/09/22  Yes [name], MD  sacubitril-valsartan (ENTRESTO) 24-26 MG TAKE 1 TABLET BY MOUTH TWICE A DAY Patient taking differently: Take 1 tablet by mouth 2 (two) times daily. 04/03/22  Yes Isaiah Serge, NP  thiamine (VITAMIN B-1) 50 MG tablet Take 25 mg by mouth daily.   Yes [provider]  carboxymethylcellulose (REFRESH PLUS) 0.5 % SOLN Place 1 drop into both eyes daily as needed (dry eyes).    [provider]    Inpatient Medications: Scheduled Meds:  apixaban  2.5 mg Oral BID   [START ON 05/24/2022] levothyroxine  225 mcg Oral Q0600   pravastatin  40 mg Oral q1800   Continuous Infusions:  cefTRIAXone (ROCEPHIN)  IV 1 g (05/23/22 1310)   PRN Meds:   Allergies:    Allergies  Allergen Reactions   Fluoxetine     Other reaction(s): hyponatremia   Serotonin Reuptake Inhibitors (Ssris)     Other reaction(s): hyponatremia    Social History:   Social History   Socioeconomic History   Marital status: Married    Spouse name: Bennye Nix   Number of children: 1   Years of education: Not on file   Highest education level: High school graduate  Occupational History   Occupation: retired  Tobacco Use   Smoking status: Never   Smokeless tobacco: Never  Vaping Use   Vaping Use: Never used  Substance and Sexual Activity   Alcohol use: No   Drug use: No   Sexual activity: Not on file  Other Topics Concern   Not on file  Social History Narrative   Not on file   Social Determinants of Health   Financial Resource Strain: Low Risk  (12/16/2021)   Overall Financial Resource Strain (CARDIA)    Difficulty of Paying Living Expenses: Not hard at all  Food Insecurity: No Food Insecurity (12/16/2021)   Hunger Vital Sign    Worried About Running Out of Food in the Last Year: Never true     [place] in the Last Year: Never true  Transportation Needs: No Transportation Needs (12/16/2021)   PRAPARE - Hydrologist (Medical): No    Lack of Transportation (Non-Medical): No  Physical Activity: Not on file  Stress: Not on  file  Social Connections: Not on file  Intimate Partner Violence: Not on file    Family History:   Family History  Problem Relation Age of Onset   Heart attack Mother      ROS:  Please see the history of present illness.  Review of Systems  Constitutional:  Negative for chills and fever.  HENT:  Positive for congestion (nasal drip).   Respiratory:  Positive for cough and shortness of breath.   Cardiovascular:  Positive for leg swelling (intermittent). Negative for chest pain, orthopnea and PND.  Gastrointestinal:  Positive for diarrhea. Negative for blood in stool, melena, nausea and vomiting.  Genitourinary:  Negative for hematuria.  Musculoskeletal:  Negative for myalgias.  Neurological:  Positive for dizziness. Negative for loss of consciousness.  Endo/Heme/Allergies:  Does not bruise/bleed easily.  Psychiatric/Behavioral:  Negative for substance abuse.        Confusion   Physical Exam/Data:   Vitals:   05/23/22 0500 05/23/22 0800 05/23/22 0830 05/23/22 0900  BP: (!) 147/110  (!) 141/56   Pulse: 61 (!) 55 (!) 55 (!) 55  Resp: 18 17 (!) 25 15  Temp: 97.8 F (36.6 C)  97.8 F (36.6 C)   TempSrc: Oral  Oral   SpO2: 99% 98% 99% 97%  Weight:      Height:        Intake/Output Summary (Last 24 hours) at 05/23/2022 1610 Last data filed at 05/23/2022 0900 Gross per 24 hour  Intake 139.63 ml  Output 50 ml  Net 89.63 ml      05/23/2022   12:36 AM 05/23/2022   12:16 AM 05/22/2022   10:21 AM  Last 3 Weights  Weight (lbs) 205 lb 4 oz 205 lb 0.4 oz 200 lb 6.4 oz  Weight (kg) 93.1 kg 93 kg 90.901 kg     Body mass index is 33.13 kg/m.  General: 88 y.o. Caucasian female resting comfortably in no acute  distress. HEENT: Normocephalic and atraumatic. Sclera clear.  Neck: Supple. No JVD. Heart: Bradycardic with normal rhythm.  No murmurs, gallops, or rubs. Radial pulses 2+ and equal bilaterally. Lungs: No increased work of breathing. Clear to ausculation bilaterally. No wheezes, rhonchi, or rales.  Abdomen: Soft, non-distended, and non-tender to palpation. Extremities: No lower extremity edema.    Skin: Warm and dry. Neuro: Alert and oriented x3. No focal deficits. Psych: Normal affect. Some confusion.  EKG:  The EKG was personally reviewed and demonstrates:  Sinus bradycardia, rate 42 bpm, with known RBBB and possible 2nd degree type 1 AV block (Wenckebach). Telemetry:  Telemetry was personally reviewed and demonstrates:  Sinus rhythm with rates as low as the 30s but currently in the 50s.  Relevant CV Studies:  Echocardiogram 12/09/2021: Impressions: 1. Left ventricular ejection fraction, by estimation, is 45%. The left  ventricle has mildly decreased function. The left ventricle demonstrates  global hypokinesis. Left ventricular diastolic parameters are  indeterminate.   2. Right ventricular systolic function is mildly reduced. The right  ventricular size is normal. There is mildly elevated pulmonary artery  systolic pressure. The estimated right ventricular systolic pressure is  12.2 mmHg.   3. Left atrial size was mildly dilated.   4. The mitral valve is abnormal. Moderate mitral valve regurgitation. No  evidence of mitral stenosis.   5. The aortic valve is tricuspid. There is moderate calcification of the  aortic valve. Aortic valve regurgitation is mild. Aortic valve  sclerosis/calcification is present, without any evidence of aortic  stenosis.  6. Aortic dilatation noted. There is mild dilatation of the ascending  aorta, measuring 40 mm.   7. The inferior vena cava is dilated in size with <50% respiratory  variability, suggesting right atrial pressure of 15 mmHg.   8. There  was a left pleural effusion.   9. The patient was in atrial fibrillation.   Laboratory Data:  High Sensitivity Troponin:   Recent Labs  Lab 05/22/22 2010 05/22/22 2201  TROPONINIHS 6 9     Chemistry Recent Labs  Lab 05/22/22 2010 05/22/22 2027 05/22/22 2201 05/23/22 0155  NA 139 138  --  132*  K 4.2 4.1  --  4.4  CL 108 104  --  100  CO2 21*  --   --  22  GLUCOSE 99 91  --  103*  BUN 25* 28*  --  25*  CREATININE 1.68* 1.70*  --  1.76*  CALCIUM 9.0  --   --  8.7*  MG  --   --  2.0  --   GFRNONAA 29*  --   --  27*  ANIONGAP 10  --   --  10    Recent Labs  Lab 05/22/22 2010 05/23/22 0155  PROT 6.3* 6.4*  ALBUMIN 3.6 3.5  AST 51* 41  ALT 43 38  ALKPHOS 92 85  BILITOT 0.4 0.5   Lipids No results for input(s): "CHOL", "TRIG", "HDL", "LABVLDL", "LDLCALC", "CHOLHDL" in the last 168 hours.  Hematology Recent Labs  Lab 05/22/22 2010 05/22/22 2027 05/23/22 0155  WBC 6.8  --  7.5  RBC 4.57  --  4.47  HGB 12.4 13.3 12.4  HCT 40.2 39.0 37.9  MCV 88.0  --  84.8  MCH 27.1  --  27.7  MCHC 30.8  --  32.7  RDW 14.5  --  14.4  PLT 208  --  175   Thyroid  Recent Labs  Lab 05/23/22 0155  TSH 13.968*    BNP Recent Labs  Lab 05/22/22 2011  BNP 335.8*    DDimer No results for input(s): "DDIMER" in the last 168 hours.   Radiology/Studies:  DG Pelvis Portable  Result Date: 05/23/2022 CLINICAL DATA:  88 year old female with confusion and pain. EXAM: PORTABLE PELVIS 1-2 VIEWS COMPARISON:  Fluoroscopic right hip injection 09/11/2010. FINDINGS: Portable AP supine view at 0630 hours. Femoral heads are normally located. Pelvis appears intact. SI joints and symphysis within normal limits. Grossly intact proximal femurs. Partially visible lumbar disc and endplate degeneration. No acute osseous abnormality identified. Visible bowel gas pattern is within normal limits. IMPRESSION: No acute osseous abnormality identified about the pelvis. If there is lateralizing hip pain  recommend dedicated hip series. Electronically Signed   By: Genevie Ann M.D.   On: 05/23/2022 07:08   MR BRAIN WO CONTRAST  Result Date: 05/23/2022 CLINICAL DATA:  Acute neurologic deficit EXAM: MRI HEAD WITHOUT CONTRAST TECHNIQUE: Multiplanar, multiecho pulse sequences of the brain and surrounding structures were obtained without intravenous contrast. COMPARISON:  None Available. FINDINGS: Brain: No acute infarct, mass effect or extra-axial collection. No acute or chronic hemorrhage. Normal white matter signal. Generalized volume loss. 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 Midline structures are normal. Vascular: Major flow voids are preserved. Skull and upper cervical spine: Normal calvarium and skull base. Visualized upper cervical spine and soft tissues are normal. Sinuses/Orbits:No paranasal sinus fluid levels or advanced mucosal thickening. No mastoid or middle ear effusion. Normal orbits. IMPRESSION: 1. No acute intracranial abnormality. 2. 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 Electronically Signed   By: Ulyses Jarred M.D.   On: 05/23/2022 02:02   DG Chest Port 1 View  Result Date: 05/22/2022 CLINICAL DATA:  Bradycardia and shortness of breath EXAM: PORTABLE CHEST 1 VIEW COMPARISON:  Chest x-ray dated December 28, 2021 FINDINGS: Patient is rotated to the right. Cardiac and mediastinal contours are unchanged when accounting for exam technique. Elevation of the right hemidiaphragm. Mild basilar opacities, likely due to atelectasis. No large pleural effusion or evidence of pneumothorax. Old left-sided rib fractures. IMPRESSION: Mild basilar opacities, likely due to atelectasis. Electronically Signed   By: Yetta Glassman M.D.   On: 05/22/2022 20:29     Assessment and Plan:   Sinus Bradycardia  Hypotension Patient underwent successful DCCV yesterday and then started having intermittent dizziness and was found to have a very low BP and heart rate.  Heart rate was  reportedly 28 bpm and BP 90/60 in the field. She required 2 doses of atropine before arrival to the ED and then another dose upon arrival due to concern for complete heart block.  I do not see any evidence of complete heart block on telemetry or EKG.  Initial EKG shows sinus bradycardia, rate 42 bpm, with what looks like second-degree type I AV block (slightly prolonged PR that are dropped beat). Will review with MD. High-sensitivity troponin negative x2.  Potassium and magnesium normal.  TSH elevated at 13.9.  Home Amiodarone, Toprol-XL, and Entresto were all held. - Heart rate is improving.  She is currently in sinus bradycardia with rates in the 50s.  BP has also stabilized. - Would remain off Toprol-XL given significant bradycardia. - Continue to hold Entresto as well until renal function improves. - TSH very elevated. Patient does have a history of hypothyroidism. Will order free T4 and free T3 but will defer management of this to primary team.  Persistent Atrial Fibrillation S/p failed TEE/DCCV in 12/2021 with quick return of atrial fibrillation. Started on Amiodarone and underwent repeat DCCV on 05/22/2022. - Maintaining sinus rhythm.  - Will discuss whether to restart or remain off Amiodarone with MD. - Will stop Toprol-XL given bradycardia. - Continue chronic anticoagulation with Eliquis. On '5mg'$  twice daily at home but given current renal function she actually missed criteria for reduced dosing. Will decrease her to 2.'5mg'$  twice daily.  However, if renal function improves will need to adjust dose.  Chronic CHF with Mildly Reduced EF Echo in 12/2021 showed LVEF of 45% with global hypokinesis, normal RV with mildly reduced RV function, moderate MR, and mild AI. BNP 335 this admission. Chest x-ray shows mild basilar opacities likely due to atelectasis. - She does have mild crackles in basis but otherwise does not appear significantly volume overload. Possible atelectasis. - I would consider  restarting PO Lasix '40mg'$  daily given mild crackles on exam and mildly elevated BNP. Will discuss with MD. - Will continue to hold Entresto 24-'26mg'$  twice daily given AKI. - Will stop Toprol-XL '25mg'$  given bradycardia. - Continue to monitor volume status closely. - Of note, family did express concern about the cost of Entresto (currently in donut hole). May need to switch to Losartan if cost is going to be an issue.  Hyperlipidemia - OK to restart home Lovastatin '40mg'$  daily (Lovastatin not on formulary here so will start Pravastatin instead).  Acute on Chronic CKD Stage III Creatinine 1.68 on admission and up to 1.76 today. Baseline around 1.0 to 1.3. Suspect due reduced renal perfusion due to  bradycardia and hypotension. - Will continue to hold [facility] for now. - Continue to monitor closely.  Otherwise, per primary team: - Acute encephalopathy - Hypothyroidism with elevated TSH - Possible UTI  Risk Assessment/Risk Scores:    New York Heart Association (NYHA) Functional Class NYHA Class II-III  CHA2DS2-VASc Score = 5  This indicates a 7.2% annual risk of stroke. The patient's score is based upon: CHF History: 1 HTN History: 1 Diabetes History: 0 Stroke History: 0 Vascular Disease History: 0 Age Score: 2 Gender Score: 1   For questions or updates, please contact [place] Please consult [email or web address] for contact info under    Signed, Darreld Mclean, PA-C  05/23/2022 4:10 PM  History and all data above reviewed.  Patient examined.  I agree with the findings as above.    She is status post cardioversion.  She had bradycardia as described.  At the time of discharge she was said to be in normal sinus rhythm.  At the time of her discharge from the cardioversion she was told only to take 25 mg of Toprol-XL.  She had been on 50.  It looks like her post cardioversion EKG did demonstrate sinus bradycardia with first-degree AV block with occasional dropped sinus beats.  She said  she was tired when she left the hospital but she had not eaten anything.  She has been up early.  However, she seemed to get worse throughout the evening when she checked her heart rate.  EMS was called and she was managed as above.  Today she has a heart rate sinus in the 60s with first-degree AV block.  She actually feels much better.  She is not having any chest pressure.  She is not having any presyncope or syncope.  She had some mild cough but this has been chronic.  She is not having any new shortness of breath the patient exam reveals COR: Regular rate and rhythm,  Lungs: Few coarse crackles at the bases,  Abd: Positive bowel sounds no rebound, guarding, Ext 2+ pulses, no edema.  All available labs, radiology testing, previous records reviewed. Agree with documented assessment and plan.  Bradycardia: This was post cardioversion and she was on beta-blockers.  She taken her Toprol 63 yesterday.  She currently in sinus rhythm and without the beta-blocker her heart rate has come up and she feels better.  I am going to restart the amiodarone.  She is on Eliquis.  We will of course avoid restarting the beta-blocker.  AKI: The patient does have some rise in her creatinine.  We are holding her Entresto.  I will hold off on diuretics and she is not overtly volume overloaded even though the BNP is slightly elevated.  She is being treated for UTI.  Jeneen Rinks Shameika Speelman  5:10 PM  05/23/2022

## 2022-05-23 NOTE — Plan of Care (Signed)
  Problem: Safety: Goal: Ability to remain free from injury will improve Outcome: Progressing   

## 2022-05-23 NOTE — Plan of Care (Signed)
  Problem: Clinical Measurements: Goal: Diagnostic test results will improve Outcome: Progressing   Problem: Activity: Goal: Risk for activity intolerance will decrease Outcome: Progressing   Problem: Coping: Goal: Level of anxiety will decrease Outcome: Progressing   Problem: Safety: Goal: Ability to remain free from injury will improve Outcome: Progressing

## 2022-05-24 ENCOUNTER — Encounter (HOSPITAL_COMMUNITY): Payer: Self-pay | Admitting: Cardiovascular Disease

## 2022-05-24 DIAGNOSIS — E039 Hypothyroidism, unspecified: Secondary | ICD-10-CM | POA: Diagnosis not present

## 2022-05-24 DIAGNOSIS — R001 Bradycardia, unspecified: Secondary | ICD-10-CM | POA: Diagnosis not present

## 2022-05-24 DIAGNOSIS — I480 Paroxysmal atrial fibrillation: Secondary | ICD-10-CM

## 2022-05-24 LAB — CBC
HCT: 34.6 % — ABNORMAL LOW (ref 36.0–46.0)
Hemoglobin: 11 g/dL — ABNORMAL LOW (ref 12.0–15.0)
MCH: 27.2 pg (ref 26.0–34.0)
MCHC: 31.8 g/dL (ref 30.0–36.0)
MCV: 85.4 fL (ref 80.0–100.0)
Platelets: 163 10*3/uL (ref 150–400)
RBC: 4.05 MIL/uL (ref 3.87–5.11)
RDW: 14.4 % (ref 11.5–15.5)
WBC: 6.9 10*3/uL (ref 4.0–10.5)
nRBC: 0 % (ref 0.0–0.2)

## 2022-05-24 LAB — T3, FREE: T3, Free: 1.3 pg/mL — ABNORMAL LOW (ref 2.0–4.4)

## 2022-05-24 LAB — COMPREHENSIVE METABOLIC PANEL
ALT: 25 U/L (ref 0–44)
AST: 20 U/L (ref 15–41)
Albumin: 3.1 g/dL — ABNORMAL LOW (ref 3.5–5.0)
Alkaline Phosphatase: 71 U/L (ref 38–126)
Anion gap: 7 (ref 5–15)
BUN: 29 mg/dL — ABNORMAL HIGH (ref 8–23)
CO2: 24 mmol/L (ref 22–32)
Calcium: 8.6 mg/dL — ABNORMAL LOW (ref 8.9–10.3)
Chloride: 106 mmol/L (ref 98–111)
Creatinine, Ser: 1.76 mg/dL — ABNORMAL HIGH (ref 0.44–1.00)
GFR, Estimated: 27 mL/min — ABNORMAL LOW (ref >60)
Glucose, Bld: 100 mg/dL — ABNORMAL HIGH (ref 70–99)
Potassium: 4 mmol/L (ref 3.5–5.1)
Sodium: 137 mmol/L (ref 135–145)
Total Bilirubin: 0.6 mg/dL (ref 0.3–1.2)
Total Protein: 5.7 g/dL — ABNORMAL LOW (ref 6.5–8.1)

## 2022-05-24 MED ORDER — FUROSEMIDE 10 MG/ML IJ SOLN
40.0000 mg | Freq: Every day | INTRAMUSCULAR | Status: DC
  Administered 2022-05-24 – 2022-05-26 (×3): 40 mg via INTRAVENOUS
  Filled 2022-05-24 (×3): qty 4

## 2022-05-24 MED ORDER — SODIUM CHLORIDE 0.9 % IV BOLUS
500.0000 mL | Freq: Once | INTRAVENOUS | Status: AC
  Administered 2022-05-24: 500 mL via INTRAVENOUS

## 2022-05-24 MED ORDER — ATROPINE SULFATE 1 MG/10ML IJ SOSY
PREFILLED_SYRINGE | INTRAMUSCULAR | Status: AC
  Filled 2022-05-24: qty 10

## 2022-05-24 MED ORDER — ATROPINE SULFATE 1 MG/10ML IJ SOSY
1.0000 mg | PREFILLED_SYRINGE | Freq: Once | INTRAMUSCULAR | Status: AC
  Administered 2022-05-24: 1 mg via INTRAVENOUS

## 2022-05-24 MED ORDER — ONDANSETRON HCL 4 MG/2ML IJ SOLN: 4.0000 mg | Freq: Four times a day (QID) | INTRAMUSCULAR | Status: DC | PRN

## 2022-05-24 MED ORDER — ACETAMINOPHEN 325 MG PO TABS
650.0000 mg | ORAL_TABLET | Freq: Four times a day (QID) | ORAL | Status: DC | PRN
  Administered 2022-05-26 – 2022-05-28 (×5): 650 mg via ORAL
  Filled 2022-05-24 (×7): qty 2

## 2022-05-24 NOTE — Progress Notes (Signed)
PROGRESS NOTE    [name]  OTL:[id number] DOB: Mar 15, 1931 DOA: 05/22/2022 PCP: Merrilee Seashore, MD  88/F with history of chronic systolic CHF, A-fib, hypothyroidism, mild memory loss, obesity, hypothyroidism underwent DC cardioversion yesterday morning, subsequently went home took a nap, woke up was extremely dizzy and weak her pulse was in the 30s and blood pressure in the 90s range, EMS was called she was noted to be bradycardic in the 30s, treated with 2 doses of atropine.  Per EDP some mention of complete heart block was given another dose of IV atropine, and admitted overnight. -Received 2 doses of Ativan and morphine-for some groin discomfort, noted to be more confused, EDP obtained MRI brain which was unremarkable, UA abnormal, started antibiotics for UTI   Subjective: -Feels better today, mild dizziness, and some dyspnea  Assessment and Plan:  Bradycardia and hypotension -Following cardioversion 8/17 -Metoprolol and amiodarone on hold, hopefully restart amiodarone soon -PE stable now  Paroxysmal atrial fibrillation -Continue Eliquis, cardioversion as outpatient 8/17, in sinus rhythm now -Beta-blocker on hold, see above  Encephalopathy, delirium UTI -UA abnormal, started IV ceftriaxone yesterday, follow-up urine cultures, had MRI brain on admission which was unremarkable, mental status improved -Avoid sedating meds -Improving, increase activity, PT eval  Acute on chronic systolic CHF -EF 97%, appears volume overloaded -Add IV Lasix today, holding Entresto and beta-blocker  -BMP in a.m.  Mild AKI -Likely secondary to hypotension, holding Entresto  Hypothyroidism -Likely worsened in the setting of amiodarone use -On Synthroid, TSH is significantly elevated, dose increased, recommend recheck TSH in 6 weeks   DVT prophylaxis: Apixaban Code Status: Full code Family Communication: No family at bedside, discussed with daughter yesterday Disposition Plan:  Home likely 48 hours  Consultants: Cards   Procedures:   Antimicrobials:    Objective: Vitals:   05/23/22 2021 05/24/22 0027 05/24/22 0536 05/24/22 0735  BP: (!) 111/54 (!) 107/51 (!) 134/53 105/72  Pulse: (!) 55 (!) 55 (!) 51 (!) 51  Resp: '18 14 18 18  '$ Temp: 98.1 F (36.7 C) 98.1 F (36.7 C) 98.1 F (36.7 C) 97.8 F (36.6 C)  TempSrc: Oral Oral Oral Oral  SpO2: 90% 92% 98% 97%  Weight:  93.5 kg    Height:        Intake/Output Summary (Last 24 hours) at 05/24/2022 1303 Last data filed at 05/24/2022 1100 Gross per 24 hour  Intake 750.5 ml  Output 550 ml  Net 200.5 ml   Filed Weights   05/23/22 0016 05/23/22 0036 05/24/22 0027  Weight: 93 kg 93.1 kg 93.5 kg    Examination:  General exam: Obese chronically ill female sitting up in bed, awake alert oriented x3 today, CVS: S1-S2, regular rhythm Lungs: Few basilar rales Abdomen: Soft, nontender bowel sounds present Extremities: No edema  Neuro: Moves all extremities, no localizing signs Skin: No rashes Psychiatry:  Mood & affect appropriate.     Data Reviewed:   CBC: Recent Labs  Lab 05/22/22 2010 05/22/22 2027 05/23/22 0155 05/24/22 0344  WBC 6.8  --  7.5 6.9  NEUTROABS 3.9  --   --   --   HGB 12.4 13.3 12.4 11.0*  HCT 40.2 39.0 37.9 34.6*  MCV 88.0  --  84.8 85.4  PLT 208  --  175 416   Basic Metabolic Panel: Recent Labs  Lab 05/22/22 2010 05/22/22 2027 05/22/22 2201 05/23/22 0155 05/24/22 0344  NA 139 138  --  132* 137  K 4.2 4.1  --  4.4  4.0  CL 108 104  --  100 106  CO2 21*  --   --  22 24  GLUCOSE 99 91  --  103* 100*  BUN 25* 28*  --  25* 29*  CREATININE 1.68* 1.70*  --  1.76* 1.76*  CALCIUM 9.0  --   --  8.7* 8.6*  MG  --   --  2.0  --   --    GFR: Estimated Creatinine Clearance: 25.5 mL/min (A) (by C-G formula based on SCr of 1.76 mg/dL (H)). Liver Function Tests: Recent Labs  Lab 05/22/22 2010 05/23/22 0155 05/24/22 0344  AST 51* 41 20  ALT 43 38 25  ALKPHOS 92 85 71   BILITOT 0.4 0.5 0.6  PROT 6.3* 6.4* 5.7*  ALBUMIN 3.6 3.5 3.1*   No results for input(s): "LIPASE", "AMYLASE" in the last 168 hours. No results for input(s): "AMMONIA" in the last 168 hours. Coagulation Profile: No results for input(s): "INR", "PROTIME" in the last 168 hours. Cardiac Enzymes: No results for input(s): "CKTOTAL", "CKMB", "CKMBINDEX", "TROPONINI" in the last 168 hours. BNP (last 3 results) No results for input(s): "PROBNP" in the last 8760 hours. HbA1C: No results for input(s): "HGBA1C" in the last 72 hours. CBG: Recent Labs  Lab 05/22/22 2352 05/23/22 0606 05/23/22 1122 05/23/22 2113  GLUCAP 103* 87 87 118*   Lipid Profile: No results for input(s): "CHOL", "HDL", "LDLCALC", "TRIG", "CHOLHDL", "LDLDIRECT" in the last 72 hours. Thyroid Function Tests: Recent Labs    05/23/22 0155 05/23/22 1704  TSH 13.968*  --   FREET4  --  1.40*  T3FREE  --  1.3*   Anemia Panel: No results for input(s): "VITAMINB12", "FOLATE", "FERRITIN", "TIBC", "IRON", "RETICCTPCT" in the last 72 hours. Urine analysis:    Component Value Date/Time   COLORURINE YELLOW 05/23/2022 0922   APPEARANCEUR CLOUDY (A) 05/23/2022 0922   APPEARANCEUR Clear 01/08/2022 1554   LABSPEC 1.015 05/23/2022 0922   PHURINE 5.0 05/23/2022 0922   GLUCOSEU NEGATIVE 05/23/2022 0922   HGBUR SMALL (A) 05/23/2022 0922   BILIRUBINUR NEGATIVE 05/23/2022 0922   BILIRUBINUR Negative 01/08/2022 [place] 05/23/2022 0922   PROTEINUR 100 (A) 05/23/2022 0922   NITRITE NEGATIVE 05/23/2022 0922   LEUKOCYTESUR LARGE (A) 05/23/2022 0922   Sepsis Labs: '@LABRCNTIP'$ (procalcitonin:4,lacticidven:4)  ) Recent Results (from the past 240 hour(s))  Resp Panel by RT-PCR (Flu A&B, Covid) Anterior Nasal Swab     Status: None   Collection Time: 05/22/22  8:11 PM   Specimen: Anterior Nasal Swab  Result Value Ref Range Status   SARS Coronavirus 2 by RT PCR NEGATIVE NEGATIVE Final    Comment: (NOTE) SARS-CoV-2  target nucleic acids are NOT DETECTED.  The SARS-CoV-2 RNA is generally detectable in upper respiratory specimens during the acute phase of infection. The lowest concentration of SARS-CoV-2 viral copies this assay can detect is 138 copies/mL. A negative result does not preclude SARS-Cov-2 infection and should not be used as the sole basis for treatment or other patient management decisions. A negative result may occur with  improper specimen collection/handling, submission of specimen other than nasopharyngeal swab, presence of viral mutation(s) within the areas targeted by this assay, and inadequate number of viral copies(<138 copies/mL). A negative result must be combined with clinical observations, patient history, and epidemiological information. The expected result is Negative.  Fact Sheet for Patients:  EntrepreneurPulse.com.au  Fact Sheet for Healthcare Providers:  IncredibleEmployment.be  This test is no t yet approved or cleared by  the [employer] and  has been authorized for detection and/or diagnosis of SARS-CoV-2 by FDA under an Emergency Use Authorization (EUA). This EUA will remain  in effect (meaning this test can be used) for the duration of the COVID-19 declaration under Section 564(b)(1) of the Act, 21 U.S.C.section 360bbb-3(b)(1), unless the authorization is terminated  or revoked sooner.       Influenza A by PCR NEGATIVE NEGATIVE Final   Influenza B by PCR NEGATIVE NEGATIVE Final    Comment: (NOTE) The Xpert Xpress SARS-CoV-2/FLU/RSV plus assay is intended as an aid in the diagnosis of influenza from Nasopharyngeal swab specimens and should not be used as a sole basis for treatment. Nasal washings and aspirates are unacceptable for Xpert Xpress SARS-CoV-2/FLU/RSV testing.  Fact Sheet for Patients: EntrepreneurPulse.com.au  Fact Sheet for Healthcare  Providers: IncredibleEmployment.be  This test is not yet approved or cleared by the [place] FDA and has been authorized for detection and/or diagnosis of SARS-CoV-2 by FDA under an Emergency Use Authorization (EUA). This EUA will remain in effect (meaning this test can be used) for the duration of the COVID-19 declaration under Section 564(b)(1) of the Act, 21 U.S.C. section 360bbb-3(b)(1), unless the authorization is terminated or revoked.  Performed at [facility], [place] [address] [place], [place] [address]   Urine Culture     Status: Abnormal (Preliminary result)   Collection Time: 05/23/22  8:49 AM   Specimen: Urine, Clean Catch  Result Value Ref Range Status   Specimen Description URINE, CLEAN CATCH  Final   Special Requests   Final    NONE Performed at [facility], [place] [address] [place], [place] [address]    Culture >=100,000 COLONIES/mL KLEBSIELLA PNEUMONIAE (A)  Final   Report Status PENDING  Incomplete     Radiology Studies: DG Pelvis Portable  Result Date: 05/23/2022 CLINICAL DATA:  88 year old female with confusion and pain. EXAM: PORTABLE PELVIS 1-2 VIEWS COMPARISON:  Fluoroscopic right hip injection 09/11/2010. FINDINGS: Portable AP supine view at 0630 hours. Femoral heads are normally located. Pelvis appears intact. SI joints and symphysis within normal limits. Grossly intact proximal femurs. Partially visible lumbar disc and endplate degeneration. No acute osseous abnormality identified. Visible bowel gas pattern is within normal limits. IMPRESSION: No acute osseous abnormality identified about the pelvis. If there is lateralizing hip pain recommend dedicated hip series. Electronically Signed   By: Genevie Ann M.D.   On: 05/23/2022 07:08   MR BRAIN WO CONTRAST  Result Date: 05/23/2022 CLINICAL DATA:  Acute neurologic deficit EXAM: MRI HEAD WITHOUT CONTRAST TECHNIQUE: Multiplanar, multiecho pulse sequences of the brain and  surrounding structures were obtained without intravenous contrast. COMPARISON:  None Available. FINDINGS: Brain: No acute infarct, mass effect or extra-axial collection. No acute or chronic hemorrhage. Normal white matter signal. Generalized volume loss. 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 Midline structures are normal. Vascular: Major flow voids are preserved. Skull and upper cervical spine: Normal calvarium and skull base. Visualized upper cervical spine and soft tissues are normal. Sinuses/Orbits:No paranasal sinus fluid levels or advanced mucosal thickening. No mastoid or middle ear effusion. Normal orbits. IMPRESSION: 1. No acute intracranial abnormality. 2. 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 Electronically Signed   By: Ulyses Jarred M.D.   On: 05/23/2022 02:02   DG Chest Port 1 View  Result Date: 05/22/2022 CLINICAL DATA:  Bradycardia and shortness of breath EXAM: PORTABLE CHEST 1 VIEW COMPARISON:  Chest x-ray dated December 28, 2021 FINDINGS: Patient is rotated to the right. Cardiac and mediastinal contours are unchanged when accounting for exam technique. Elevation of the right hemidiaphragm. Mild basilar opacities, likely due to atelectasis. No large pleural effusion or evidence of pneumothorax. Old left-sided rib fractures. IMPRESSION: Mild basilar opacities, likely due to atelectasis. Electronically Signed   By: Yetta Glassman M.D.   On: 05/22/2022 20:29     Scheduled Meds:  apixaban  2.5 mg Oral BID   furosemide  40 mg Intravenous Daily   levothyroxine  225 mcg Oral Q0600   pravastatin  40 mg Oral q1800   Continuous Infusions:  cefTRIAXone (ROCEPHIN)  IV 1 g (05/24/22 1302)     LOS: 1 day    Time spent: 24mn  PDomenic Polite MD [facility]   05/24/2022, 1:03 PM

## 2022-05-24 NOTE — Significant Event (Signed)
Rapid Response Event Note   Reason for Call :  Bradycardia  Initial Focused Assessment:  Pt lying in bed with eyes open, in no visible distress. Pt is alert and oriented, denies chest/SOB/dizziness. She says when her HR is low she doesn't realize it. During exam, HR dropped to 30s. Pt was asymptomatic and HR did sustain for <2 minutes.   HR-68, SBP-140s, RR-18, SpO2-97% on RA  Interventions:  Done PTA RRT: Atropine at bedside Pt attached to zoll  Plan of Care:  Atropine if pt has sustained bradycardia and is symptomatic(nauseous, CP/SOB, dizziness, hypotension, etc.). Pt attached to zoll in case ext pacing is needed(emergent situation). Continue to monitor pt closely. Call RRT if further assistance needed.   Event Summary:   MD Notified: Ongoing communicate with cards MD done by bedside RN Call [place], Sherree Shankman Anderson, RN

## 2022-05-24 NOTE — Progress Notes (Signed)
CCMD notified this RN that patient HR dropping and staying in 20s-30s. Patient assessed and is awake and talking no apparent distress. BP also soft with map below 54. MD and rapid notified. MD order for atropine and bolus . PADS placed on patient and atropine given. HR improved to 73 and repeat BP 102/42 with MAP 61.

## 2022-05-24 NOTE — Evaluation (Signed)
Physical Therapy Evaluation Patient Details Name: [name] MRN: [id number] DOB: 06-23-1931 Today's Date: 05/24/2022  History of Present Illness  88 y/o female presented to ED on 05/22/22 with bradycardia (HR 30s) and hypotension. Recent cardioversion early on 8/17. Received atropine and admitted also with UTI. PMH: CHF, Afib, mild memory loss, HTN  Clinical Impression  Patient admitted with the above. PTA, patient lives with daughter and was independent using rollator mobility and performing ADLs. Patient presents with weakness, impaired balance, and decreased activity tolerance. Patient required min-modA for bed mobility and minA to stand from elevated surface. Patient easily fatigued in standing and sitting within 15 seconds. Patient will benefit from skilled PT services during acute stay to address listed deficits. Recommend HHPT at discharge to maximize functional independence and endurance.      Recommendations for follow up therapy are one component of a multi-disciplinary discharge planning process, led by the attending physician.  Recommendations may be updated based on patient status, additional functional criteria and insurance authorization.  Follow Up Recommendations Home health PT      Assistance Recommended at Discharge Frequent or constant Supervision/Assistance  Patient can return home with the following  A lot of help with walking and/or transfers;A little help with bathing/dressing/bathroom;Assistance with cooking/housework;Assist for transportation;Help with stairs or ramp for entrance;Direct supervision/assist for medications management;Direct supervision/assist for financial management    Equipment Recommendations None recommended by PT  Recommendations for Other Services       Functional Status Assessment Patient has had a recent decline in their functional status and demonstrates the ability to make significant improvements in function in a reasonable and  predictable amount of time.     Precautions / Restrictions Precautions Precautions: Fall Precaution Comments: watch HR and O2 Restrictions Weight Bearing Restrictions: No      Mobility  Bed Mobility Overal bed mobility: Needs Assistance Bed Mobility: Supine to Sit, Sit to Supine     Supine to sit: Mod assist Sit to supine: Min assist   General bed mobility comments: modA for trunk elevation and repositioning hips towards EOB. Patient reports dizziness initially upon sitting up but quickly dissipates. Assist for LE management back into bed    Transfers Overall transfer level: Needs assistance Equipment used: Rollator (4 wheels) Transfers: Sit to/from Stand Sit to Stand: Min assist, From elevated surface           General transfer comment: assist to boost into standing. unable to stand >15 seconds prior to needing to sit. Able to laterally scoot towards HOB with min guard    Ambulation/Gait               General Gait Details: deferred due to fatigue  Stairs            Wheelchair Mobility    Modified Rankin (Stroke Patients Only)       Balance Overall balance assessment: Needs assistance Sitting-balance support: No upper extremity supported, Feet supported Sitting balance-Leahy Scale: Fair     Standing balance support: Bilateral upper extremity supported, Reliant on assistive device for balance Standing balance-Leahy Scale: Poor Standing balance comment: reliant on UE support                             Pertinent Vitals/Pain Pain Assessment Pain Assessment: No/denies pain    Home Living Family/patient expects to be discharged to:: Private residence Living Arrangements: Children Available Help at Discharge: Family Type of Home: House Home Access:  Stairs to enter   [employer] of Steps: 1   Home Layout: One level Home Equipment: Rollator (4 wheels);BSC/3in1;Grab bars - tub/shower Additional Comments: daughter  currently living with patient. Husband died 3 months ago    Prior Function Prior Level of Function : Independent/Modified Independent             Mobility Comments: ambulates with rollator ADLs Comments: independent with ADLs. Daughter performs cooking, Social research officer, government.     Journalist, newspaper        Extremity/Trunk Assessment   Upper Extremity Assessment Upper Extremity Assessment: Defer to OT evaluation    Lower Extremity Assessment Lower Extremity Assessment: Generalized weakness    Cervical / Trunk Assessment Cervical / Trunk Assessment: Kyphotic  Communication   Communication: HOH  Cognition Arousal/Alertness: Awake/alert Behavior During Therapy: WFL for tasks assessed/performed Overall Cognitive Status: History of cognitive impairments - at baseline                                 General Comments: hx of memory loss        General Comments General comments (skin integrity, edema, etc.): VSS on RA    Exercises     Assessment/Plan    PT Assessment Patient needs continued PT services  PT Problem List Decreased strength;Decreased activity tolerance;Decreased balance;Decreased mobility;Decreased knowledge of precautions;Cardiopulmonary status limiting activity       PT Treatment Interventions DME instruction;Gait training;Functional mobility training;Therapeutic activities;Therapeutic exercise;Balance training;Patient/family education    PT Goals (Current goals can be found in the Care Plan section)  Acute Rehab PT Goals Patient Stated Goal: to go home PT Goal Formulation: With patient/family Time For Goal Achievement: 06/07/22 Potential to Achieve Goals: Fair    Frequency Min 3X/week     Co-evaluation               AM-PAC PT "6 Clicks" Mobility  Outcome Measure Help needed turning from your back to your side while in a flat bed without using bedrails?: A Lot Help needed moving from lying on your back to sitting on the side of a flat bed  without using bedrails?: A Lot Help needed moving to and from a bed to a chair (including a wheelchair)?: A Little Help needed standing up from a chair using your arms (e.g., wheelchair or bedside chair)?: A Little Help needed to walk in hospital room?: A Lot Help needed climbing 3-5 steps with a railing? : A Lot 6 Click Score: 14    End of Session Equipment Utilized During Treatment: Gait belt Activity Tolerance: Patient limited by fatigue Patient left: in bed;with call bell/phone within reach;with family/visitor present Nurse Communication: Mobility status PT Visit Diagnosis: Unsteadiness on feet (R26.81);Muscle weakness (generalized) (M62.81);Difficulty in walking, not elsewhere classified (R26.2)    Time: 5364-6803 PT Time Calculation (min) (ACUTE ONLY): 34 min   Charges:   PT Evaluation $PT Eval Low Complexity: 1 Low PT Treatments $Therapeutic Activity: 8-22 mins        Di Jasmer A. Gilford Rile PT, DPT Acute Rehabilitation Services Office [phone]   Linna Hoff 05/24/2022, 1:31 PM

## 2022-05-24 NOTE — Progress Notes (Signed)
Progress Note  Patient Name: [name] Date of Encounter: 05/24/2022  [facility] Cardiologist: [name], MD    Subjective   88 year old female with a history of persistent atrial fibrillation, chronic combined CHF, right bundle branch block, hypertension, hyperlipidemia, hypothyroidism.  We are asked to see her for bradycardia and hypotension following the recent cardioversion at the request of Dr. Broadus John.  Heart rate is improved with discontinuation of metoprolol and amiodarone.  She is currently on Eliquis 2.5 mg a day and Lasix 40 mg IV a day, Synthroid 225 mcg a day.  The plan is to eventually restart amiodarone as tolerated.  HR remains slow Has dizziness when sitting up  PT/OT consult has been ordered    Inpatient Medications    Scheduled Meds:  apixaban  2.5 mg Oral BID   furosemide  40 mg Intravenous Daily   levothyroxine  225 mcg Oral Q0600   pravastatin  40 mg Oral q1800   Continuous Infusions:  cefTRIAXone (ROCEPHIN)  IV 1 g (05/23/22 1310)   PRN Meds: acetaminophen, ondansetron (ZOFRAN) IV   Vital Signs    Vitals:   05/23/22 2021 05/24/22 0027 05/24/22 0536 05/24/22 0735  BP: (!) 111/54 (!) 107/51 (!) 134/53 105/72  Pulse: (!) 55 (!) 55 (!) 51 (!) 51  Resp: '18 14 18 18  '$ Temp: 98.1 F (36.7 C) 98.1 F (36.7 C) 98.1 F (36.7 C) 97.8 F (36.6 C)  TempSrc: Oral Oral Oral Oral  SpO2: 90% 92% 98% 97%  Weight:  93.5 kg    Height:        Intake/Output Summary (Last 24 hours) at 05/24/2022 1040 Last data filed at 05/24/2022 0830 Gross per 24 hour  Intake 510.5 ml  Output 550 ml  Net -39.5 ml      05/24/2022   12:27 AM 05/23/2022   12:36 AM 05/23/2022   12:16 AM  Last 3 Weights  Weight (lbs) 206 lb 2.1 oz 205 lb 4 oz 205 lb 0.4 oz  Weight (kg) 93.5 kg 93.1 kg 93 kg      Telemetry    NSR , bradycardic - Personally Reviewed  ECG     - Personally Reviewed  Physical Exam   GEN: elderly female,   NAD No acute distress.    Neck: No JVD Cardiac: RRR, no murmurs, rubs, or gallops.  Occasional premature beat. Respiratory: Clear to auscultation bilaterally. GI: Soft, nontender, non-distended  MS: No edema; No deformity. Neuro:  Nonfocal  Psych: Normal affect   Labs    High Sensitivity Troponin:   Recent Labs  Lab 05/22/22 2010 05/22/22 2201  TROPONINIHS 6 9     Chemistry Recent Labs  Lab 05/22/22 2010 05/22/22 2027 05/22/22 2201 05/23/22 0155 05/24/22 0344  NA 139 138  --  132* 137  K 4.2 4.1  --  4.4 4.0  CL 108 104  --  100 106  CO2 21*  --   --  22 24  GLUCOSE 99 91  --  103* 100*  BUN 25* 28*  --  25* 29*  CREATININE 1.68* 1.70*  --  1.76* 1.76*  CALCIUM 9.0  --   --  8.7* 8.6*  MG  --   --  2.0  --   --   PROT 6.3*  --   --  6.4* 5.7*  ALBUMIN 3.6  --   --  3.5 3.1*  AST 51*  --   --  41 20  ALT 43  --   --  38 25  ALKPHOS 92  --   --  85 71  BILITOT 0.4  --   --  0.5 0.6  GFRNONAA 29*  --   --  27* 27*  ANIONGAP 10  --   --  10 7    Lipids No results for input(s): "CHOL", "TRIG", "HDL", "LABVLDL", "LDLCALC", "CHOLHDL" in the last 168 hours.  Hematology Recent Labs  Lab 05/22/22 2010 05/22/22 2027 05/23/22 0155 05/24/22 0344  WBC 6.8  --  7.5 6.9  RBC 4.57  --  4.47 4.05  HGB 12.4 13.3 12.4 11.0*  HCT 40.2 39.0 37.9 34.6*  MCV 88.0  --  84.8 85.4  MCH 27.1  --  27.7 27.2  MCHC 30.8  --  32.7 31.8  RDW 14.5  --  14.4 14.4  PLT 208  --  175 163   Thyroid  Recent Labs  Lab 05/23/22 0155 05/23/22 1704  TSH 13.968*  --   FREET4  --  1.40*    BNP Recent Labs  Lab 05/22/22 2011  BNP 335.8*    DDimer No results for input(s): "DDIMER" in the last 168 hours.   Radiology    DG Pelvis Portable  Result Date: 05/23/2022 CLINICAL DATA:  88 year old female with confusion and pain. EXAM: PORTABLE PELVIS 1-2 VIEWS COMPARISON:  Fluoroscopic right hip injection 09/11/2010. FINDINGS: Portable AP supine view at 0630 hours. Femoral heads are normally located. Pelvis appears  intact. SI joints and symphysis within normal limits. Grossly intact proximal femurs. Partially visible lumbar disc and endplate degeneration. No acute osseous abnormality identified. Visible bowel gas pattern is within normal limits. IMPRESSION: No acute osseous abnormality identified about the pelvis. If there is lateralizing hip pain recommend dedicated hip series. Electronically Signed   By: Genevie Ann M.D.   On: 05/23/2022 07:08   MR BRAIN WO CONTRAST  Result Date: 05/23/2022 CLINICAL DATA:  Acute neurologic deficit EXAM: MRI HEAD WITHOUT CONTRAST TECHNIQUE: Multiplanar, multiecho pulse sequences of the brain and surrounding structures were obtained without intravenous contrast. COMPARISON:  None Available. FINDINGS: Brain: No acute infarct, mass effect or extra-axial collection. No acute or chronic hemorrhage. Normal white matter signal. Generalized volume loss. 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 Midline structures are normal. Vascular: Major flow voids are preserved. Skull and upper cervical spine: Normal calvarium and skull base. Visualized upper cervical spine and soft tissues are normal. Sinuses/Orbits:No paranasal sinus fluid levels or advanced mucosal thickening. No mastoid or middle ear effusion. Normal orbits. IMPRESSION: 1. No acute intracranial abnormality. 2. 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 Electronically Signed   By: Ulyses Jarred M.D.   On: 05/23/2022 02:02   DG Chest Port 1 View  Result Date: 05/22/2022 CLINICAL DATA:  Bradycardia and shortness of breath EXAM: PORTABLE CHEST 1 VIEW COMPARISON:  Chest x-ray dated December 28, 2021 FINDINGS: Patient is rotated to the right. Cardiac and mediastinal contours are unchanged when accounting for exam technique. Elevation of the right hemidiaphragm. Mild basilar opacities, likely due to atelectasis. No large pleural effusion or evidence of pneumothorax. Old left-sided rib fractures. IMPRESSION: Mild basilar  opacities, likely due to atelectasis. Electronically Signed   By: Yetta Glassman M.D.   On: 05/22/2022 20:29    Cardiac Studies    Patient Profile     88 y.o. female    Assessment & Plan     Atrial fibrillation: She is status post successful cardioversion.  She has had bradycardia following that cardioversion. Heart rate  is still a bit slow-40s to 50s.  We will continue to allow amiodarone to wash out of her system.  I anticipate restarting at in the next several days. Continue Eliquis at current dose. We will hold metoprolol.  2.  Deconditioning: Agree with PT and OT consult.  3.  Hypothyroidism: May possibly have been worsened with amiodarone therapy.  Further plans per Dr. Broadus John And team.        For questions or updates, please contact [place] Please consult [email or web address] for contact info under        Signed, Mertie Moores, MD  05/24/2022, 10:40 AM

## 2022-05-24 NOTE — Progress Notes (Signed)
Pt. Had repeat episode of symptomatic bradycardia. On call for [facility] and cardiology made aware. Rapid RN notified. Care ongoing. Family at bedside.

## 2022-05-25 DIAGNOSIS — R001 Bradycardia, unspecified: Secondary | ICD-10-CM | POA: Diagnosis not present

## 2022-05-25 DIAGNOSIS — I480 Paroxysmal atrial fibrillation: Secondary | ICD-10-CM | POA: Diagnosis not present

## 2022-05-25 DIAGNOSIS — E039 Hypothyroidism, unspecified: Secondary | ICD-10-CM | POA: Diagnosis not present

## 2022-05-25 LAB — CBC
HCT: 33.8 % — ABNORMAL LOW (ref 36.0–46.0)
Hemoglobin: 11 g/dL — ABNORMAL LOW (ref 12.0–15.0)
MCH: 27.5 pg (ref 26.0–34.0)
MCHC: 32.5 g/dL (ref 30.0–36.0)
MCV: 84.5 fL (ref 80.0–100.0)
Platelets: 176 10*3/uL (ref 150–400)
RBC: 4 MIL/uL (ref 3.87–5.11)
RDW: 14.4 % (ref 11.5–15.5)
WBC: 7.5 10*3/uL (ref 4.0–10.5)
nRBC: 0 % (ref 0.0–0.2)

## 2022-05-25 LAB — BASIC METABOLIC PANEL
Anion gap: 7 (ref 5–15)
BUN: 25 mg/dL — ABNORMAL HIGH (ref 8–23)
CO2: 22 mmol/L (ref 22–32)
Calcium: 8.5 mg/dL — ABNORMAL LOW (ref 8.9–10.3)
Chloride: 102 mmol/L (ref 98–111)
Creatinine, Ser: 1.43 mg/dL — ABNORMAL HIGH (ref 0.44–1.00)
GFR, Estimated: 35 mL/min — ABNORMAL LOW (ref >60)
Glucose, Bld: 100 mg/dL — ABNORMAL HIGH (ref 70–99)
Potassium: 3.9 mmol/L (ref 3.5–5.1)
Sodium: 131 mmol/L — ABNORMAL LOW (ref 135–145)

## 2022-05-25 LAB — URINE CULTURE: Culture: >=100000 — AB

## 2022-05-25 MED ORDER — POTASSIUM CHLORIDE CRYS ER 20 MEQ PO TBCR
40.0000 meq | EXTENDED_RELEASE_TABLET | Freq: Once | ORAL | Status: AC
  Administered 2022-05-25: 40 meq via ORAL
  Filled 2022-05-25: qty 2

## 2022-05-25 MED ORDER — CAMPHOR-MENTHOL 0.5-0.5 % EX LOTN
TOPICAL_LOTION | CUTANEOUS | Status: DC | PRN
  Filled 2022-05-25: qty 222

## 2022-05-25 NOTE — Evaluation (Signed)
Occupational Therapy Evaluation Patient Details Name: [name] MRN: [id number] DOB: 12-01-1930 Today's Date: 05/25/2022   History of Present Illness 88 y/o female presented to ED on 05/22/22 with bradycardia (HR 30s) and hypotension. Recent cardioversion early on 8/17. Received atropine and admitted also with UTI. PMH: CHF, Afib, mild memory loss, HTN   Clinical Impression   This 88 yo female admitted with above presents to acute OT with PLOF of being totally Mod I with basic ADLs from rollator standpoint and daughter living with her and doing IADLs since pt's husband passed away 3 months ago. She currently is setup/S-min A for basic ADLs and mobility with rollator. She will continue to benefit from acute OT with follow up [place] at discharge. We will continue to follow.      Recommendations for follow up therapy are one component of a multi-disciplinary discharge planning process, led by the attending physician.  Recommendations may be updated based on patient status, additional functional criteria and insurance authorization.   Follow Up Recommendations  Home health OT    Assistance Recommended at Discharge Frequent or constant Supervision/Assistance  Patient can return home with the following A little help with walking and/or transfers;A little help with bathing/dressing/bathroom;Assistance with cooking/housework;Help with stairs or ramp for entrance;Assist for transportation;Direct supervision/assist for financial management;Direct supervision/assist for medications management    Functional Status Assessment  Patient has had a recent decline in their functional status and demonstrates the ability to make significant improvements in function in a reasonable and predictable amount of time.  Equipment Recommendations  None recommended by OT       Precautions / Restrictions Precautions Precautions: Fall Precaution Comments: watch HR and O2 Restrictions Weight Bearing  Restrictions: No      Mobility Bed Mobility Overal bed mobility: Needs Assistance Bed Mobility: Supine to Sit     Supine to sit: Min assist, HOB elevated     General bed mobility comments: A for trunk    Transfers Overall transfer level: Needs assistance Equipment used: Rollator (4 wheels) Transfers: Sit to/from Stand Sit to Stand: Min assist           General transfer comment: assist to boost into standing, able to ambulate with rollator to bathroom without any dizziness. BP and HR stable      Balance Overall balance assessment: Needs assistance Sitting-balance support: No upper extremity supported, Feet supported Sitting balance-Leahy Scale: Good     Standing balance support: Bilateral upper extremity supported, Reliant on assistive device for balance Standing balance-Leahy Scale: Poor                             ADL either performed or assessed with clinical judgement   ADL Overall ADL's : Needs assistance/impaired Eating/Feeding: Independent;Sitting   Grooming: Set up;Sitting   Upper Body Bathing: Set up;Sitting   Lower Body Bathing: Minimal assistance;Sit to/from stand   Upper Body Dressing : Set up;Sitting   Lower Body Dressing: Sit to/from stand;Minimal assistance   Toilet Transfer: Minimal assistance;Ambulation;Rollator (4 wheels);Grab bars;Comfort height toilet   Toileting- Clothing Manipulation and Hygiene: Minimal assistance;Sit to/from stand               Vision Baseline Vision/History: 1 Wears glasses Ability to See in Adequate Light: 0 Adequate Patient Visual Report: No change from baseline                 Hand Dominance Right   Extremity/Trunk Assessment Upper Extremity  Assessment Upper Extremity Assessment: Generalized weakness           Communication Communication Communication: HOH   Cognition Arousal/Alertness: Awake/alert Behavior During Therapy: WFL for tasks assessed/performed Overall Cognitive  Status: History of cognitive impairments - at baseline                                 General Comments: hx of memory loss                Home Living Family/patient expects to be discharged to:: Private residence Living Arrangements: Children Available Help at Discharge: Family Type of Home: House Home Access: Stairs to enter Technical brewer of Steps: 1   Home Layout: One level     Bathroom Shower/Tub: Chief Strategy Officer: Rollator (4 wheels);BSC/3in1;Grab bars - tub/shower   Additional Comments: daughter currently living with patient. Husband died 3 months ago      Prior Functioning/Environment Prior Level of Function : Independent/Modified Independent             Mobility Comments: ambulates with rollator ADLs Comments: independent with ADLs. Daughter performs cooking, Social research officer, government.        OT Problem List: Decreased activity tolerance;Impaired balance (sitting and/or standing)      OT Treatment/Interventions: Self-care/ADL training;DME and/or AE instruction;Patient/family education;Balance training    OT Goals(Current goals can be found in the care plan section) Acute Rehab OT Goals Patient Stated Goal: to not have to take that emergency heart medication again--it made me so jittery and feeling crazy OT Goal Formulation: With patient Time For Goal Achievement: 06/08/22 Potential to Achieve Goals: Good  OT Frequency: Min 2X/week       AM-PAC OT "6 Clicks" Daily Activity     Outcome Measure Help from another person eating meals?: None Help from another person taking care of personal grooming?: A Little Help from another person toileting, which includes using toliet, bedpan, or urinal?: A Little Help from another person bathing (including washing, rinsing, drying)?: A Little Help from another person to put on and taking off regular upper body clothing?: A Little Help from another person to put on and taking off regular  lower body clothing?: A Little 6 Click Score: 19   End of Session Equipment Utilized During Treatment: Gait belt;Rollator (4 wheels) Nurse Communication: Mobility status  Activity Tolerance: Patient tolerated treatment well Patient left: in chair;with call bell/phone within reach;with chair alarm set  OT Visit Diagnosis: Unsteadiness on feet (R26.81);Other abnormalities of gait and mobility (R26.89)                Time: 0823-0900 OT Time Calculation (min): 37 min Charges:  OT General Charges $OT Visit: 1 Visit OT Evaluation $OT Eval Moderate Complexity: 1 Mod OT Treatments $Self Care/Home Management : 8-22 mins  [place] [address], OTR/L Acute Rehab Services [facility] [phone] Office [phone]    Almon Register 05/25/2022, 11:09 AM

## 2022-05-25 NOTE — Progress Notes (Addendum)
PROGRESS NOTE    [name]  VPX:[id number] DOB: 07-07-1931 DOA: 05/22/2022 PCP: Merrilee Seashore, MD  89/F with history of chronic systolic CHF, A-fib, hypothyroidism, mild memory loss, obesity, hypothyroidism underwent DC cardioversion yesterday morning, subsequently went home took a nap, woke up was extremely dizzy and weak her pulse was in the 30s and blood pressure in the 90s range, EMS was called she was noted to be bradycardic in the 30s, treated with 2 doses of atropine.  Per EDP some mention of complete heart block was given another dose of IV atropine, and admitted overnight. -Received 2 doses of Ativan and morphine-for some groin discomfort, noted to be more confused, EDP obtained MRI brain which was unremarkable, UA abnormal, started antibiotics for UTI   Subjective: -Yesterday evening had bradycardia, heart rate down to 20-30 range improved after atropine  Assessment and Plan:  Bradycardia and hypotension -Following cardioversion 8/17 -Metoprolol and amiodarone on hold, -Had recurrent bradycardia with heart rate in the 20-30 range yesterday evening, patient was asymptomatic, improved after atropine -Await cardiology input  Paroxysmal atrial fibrillation -Continue Eliquis, cardioversion as outpatient 8/17, in sinus rhythm now -Beta-blocker on hold, see above  Encephalopathy, delirium UTI -UA abnormal, started IV ceftriaxone urine cultures with Klebsiella, follow-up sensitivity, had MRI brain on admission which was unremarkable, mental status improved -Avoid sedating meds -Improving, increase activity, PT eval  Acute on chronic systolic CHF -EF 46%, appears volume overloaded -Continue IV Lasix today, holding Entresto and beta-blocker  -BMP in a.m.  Mild AKI -Likely secondary to hypotension, holding Entresto  Hypothyroidism -Likely worsened in the setting of amiodarone use -On Synthroid, TSH is significantly elevated, Synthroid dose increased, recommend  recheck TSH in 6 weeks   DVT prophylaxis: Apixaban Code Status: DNR, discussed CODE STATUS with the patient Family Communication: Daughter at bedside Disposition Plan: Home likely 48 hours  Consultants: Cards   Procedures:   Antimicrobials:    Objective: Vitals:   05/24/22 2300 05/25/22 0000 05/25/22 0407 05/25/22 0840  BP: (!) 141/59 (!) 139/53 (!) 133/53 124/69  Pulse: 65 66 62 61  Resp: '20 18 17 17  '$ Temp:   98.2 F (36.8 C) 98.5 F (36.9 C)  TempSrc:   Oral Oral  SpO2: 99% 97% 95% 98%  Weight:   93.6 kg   Height:        Intake/Output Summary (Last 24 hours) at 05/25/2022 1046 Last data filed at 05/25/2022 0900 Gross per 24 hour  Intake 1434 ml  Output 2200 ml  Net -766 ml   Filed Weights   05/23/22 0036 05/24/22 0027 05/25/22 0407  Weight: 93.1 kg 93.5 kg 93.6 kg    Examination:  General exam: Obese chronically ill female sitting up in bed, AAOx3, no distress HEENT: Positive JVD CVS: S1-S2, regular rhythm Lungs: Fine bilateral rales Abdomen: Soft, nontender, bowel sounds present Extremities: No edema  Neuro: Moves all extremities, no localizing signs Skin: No rashes Psychiatry:  Mood & affect appropriate.     Data Reviewed:   CBC: Recent Labs  Lab 05/22/22 2010 05/22/22 2027 05/23/22 0155 05/24/22 0344 05/25/22 0450  WBC 6.8  --  7.5 6.9 7.5  NEUTROABS 3.9  --   --   --   --   HGB 12.4 13.3 12.4 11.0* 11.0*  HCT 40.2 39.0 37.9 34.6* 33.8*  MCV 88.0  --  84.8 85.4 84.5  PLT 208  --  175 163 270   Basic Metabolic Panel: Recent Labs  Lab 05/22/22 2010 05/22/22 2027  05/22/22 2201 05/23/22 0155 05/24/22 0344 05/25/22 0450  NA 139 138  --  132* 137 131*  K 4.2 4.1  --  4.4 4.0 3.9  CL 108 104  --  100 106 102  CO2 21*  --   --  '22 24 22  '$ GLUCOSE 99 91  --  103* 100* 100*  BUN 25* 28*  --  25* 29* 25*  CREATININE 1.68* 1.70*  --  1.76* 1.76* 1.43*  CALCIUM 9.0  --   --  8.7* 8.6* 8.5*  MG  --   --  2.0  --   --   --     GFR: Estimated Creatinine Clearance: 31.3 mL/min (A) (by C-G formula based on SCr of 1.43 mg/dL (H)). Liver Function Tests: Recent Labs  Lab 05/22/22 2010 05/23/22 0155 05/24/22 0344  AST 51* 41 20  ALT 43 38 25  ALKPHOS 92 85 71  BILITOT 0.4 0.5 0.6  PROT 6.3* 6.4* 5.7*  ALBUMIN 3.6 3.5 3.1*   No results for input(s): "LIPASE", "AMYLASE" in the last 168 hours. No results for input(s): "AMMONIA" in the last 168 hours. Coagulation Profile: No results for input(s): "INR", "PROTIME" in the last 168 hours. Cardiac Enzymes: No results for input(s): "CKTOTAL", "CKMB", "CKMBINDEX", "TROPONINI" in the last 168 hours. BNP (last 3 results) No results for input(s): "PROBNP" in the last 8760 hours. HbA1C: No results for input(s): "HGBA1C" in the last 72 hours. CBG: Recent Labs  Lab 05/22/22 2352 05/23/22 0606 05/23/22 1122 05/23/22 2113  GLUCAP 103* 87 87 118*   Lipid Profile: No results for input(s): "CHOL", "HDL", "LDLCALC", "TRIG", "CHOLHDL", "LDLDIRECT" in the last 72 hours. Thyroid Function Tests: Recent Labs    05/23/22 0155 05/23/22 1704  TSH 13.968*  --   FREET4  --  1.40*  T3FREE  --  1.3*   Anemia Panel: No results for input(s): "VITAMINB12", "FOLATE", "FERRITIN", "TIBC", "IRON", "RETICCTPCT" in the last 72 hours. Urine analysis:    Component Value Date/Time   COLORURINE YELLOW 05/23/2022 0922   APPEARANCEUR CLOUDY (A) 05/23/2022 0922   APPEARANCEUR Clear 01/08/2022 1554   LABSPEC 1.015 05/23/2022 0922   PHURINE 5.0 05/23/2022 0922   GLUCOSEU NEGATIVE 05/23/2022 0922   HGBUR SMALL (A) 05/23/2022 0922   BILIRUBINUR NEGATIVE 05/23/2022 0922   BILIRUBINUR Negative 01/08/2022 [place] 05/23/2022 0922   PROTEINUR 100 (A) 05/23/2022 0922   NITRITE NEGATIVE 05/23/2022 0922   LEUKOCYTESUR LARGE (A) 05/23/2022 0922   Sepsis Labs: '@LABRCNTIP'$ (procalcitonin:4,lacticidven:4)  ) Recent Results (from the past 240 hour(s))  Resp Panel by RT-PCR  (Flu A&B, Covid) Anterior Nasal Swab     Status: None   Collection Time: 05/22/22  8:11 PM   Specimen: Anterior Nasal Swab  Result Value Ref Range Status   SARS Coronavirus 2 by RT PCR NEGATIVE NEGATIVE Final    Comment: (NOTE) SARS-CoV-2 target nucleic acids are NOT DETECTED.  The SARS-CoV-2 RNA is generally detectable in upper respiratory specimens during the acute phase of infection. The lowest concentration of SARS-CoV-2 viral copies this assay can detect is 138 copies/mL. A negative result does not preclude SARS-Cov-2 infection and should not be used as the sole basis for treatment or other patient management decisions. A negative result may occur with  improper specimen collection/handling, submission of specimen other than nasopharyngeal swab, presence of viral mutation(s) within the areas targeted by this assay, and inadequate number of viral copies(<138 copies/mL). A negative result must be combined with clinical observations,  patient history, and epidemiological information. The expected result is Negative.  Fact Sheet for Patients:  EntrepreneurPulse.com.au  Fact Sheet for Healthcare Providers:  IncredibleEmployment.be  This test is no t yet approved or cleared by the [place] FDA and  has been authorized for detection and/or diagnosis of SARS-CoV-2 by FDA under an Emergency Use Authorization (EUA). This EUA will remain  in effect (meaning this test can be used) for the duration of the COVID-19 declaration under Section 564(b)(1) of the Act, 21 U.S.C.section 360bbb-3(b)(1), unless the authorization is terminated  or revoked sooner.       Influenza A by PCR NEGATIVE NEGATIVE Final   Influenza B by PCR NEGATIVE NEGATIVE Final    Comment: (NOTE) The Xpert Xpress SARS-CoV-2/FLU/RSV plus assay is intended as an aid in the diagnosis of influenza from Nasopharyngeal swab specimens and should not be used as a sole basis for  treatment. Nasal washings and aspirates are unacceptable for Xpert Xpress SARS-CoV-2/FLU/RSV testing.  Fact Sheet for Patients: EntrepreneurPulse.com.au  Fact Sheet for Healthcare Providers: IncredibleEmployment.be  This test is not yet approved or cleared by the [place] FDA and has been authorized for detection and/or diagnosis of SARS-CoV-2 by FDA under an Emergency Use Authorization (EUA). This EUA will remain in effect (meaning this test can be used) for the duration of the COVID-19 declaration under Section 564(b)(1) of the Act, 21 U.S.C. section 360bbb-3(b)(1), unless the authorization is terminated or revoked.  Performed at [facility], [place] [address], [place], [address] [address]   Urine Culture     Status: Abnormal   Collection Time: 05/23/22  8:49 AM   Specimen: Urine, Clean Catch  Result Value Ref Range Status   Specimen Description URINE, CLEAN CATCH  Final   Special Requests   Final    NONE Performed at [facility], [place] [address] [place], [place] [address]    Culture >=100,000 COLONIES/mL KLEBSIELLA PNEUMONIAE (A)  Final   Report Status 05/25/2022 FINAL  Final   Organism ID, Bacteria KLEBSIELLA PNEUMONIAE (A)  Final      Susceptibility   Klebsiella pneumoniae - MIC*    AMPICILLIN RESISTANT Resistant     CEFAZOLIN <=4 SENSITIVE Sensitive     CEFEPIME <=0.12 SENSITIVE Sensitive     CEFTRIAXONE <=0.25 SENSITIVE Sensitive     CIPROFLOXACIN <=0.25 SENSITIVE Sensitive     GENTAMICIN <=1 SENSITIVE Sensitive     IMIPENEM <=0.25 SENSITIVE Sensitive     NITROFURANTOIN <=16 SENSITIVE Sensitive     TRIMETH/SULFA <=20 SENSITIVE Sensitive     AMPICILLIN/SULBACTAM 4 SENSITIVE Sensitive     PIP/TAZO <=4 SENSITIVE Sensitive     * >=100,000 COLONIES/mL KLEBSIELLA PNEUMONIAE     Radiology Studies: No results found.   Scheduled Meds:  apixaban  2.5 mg Oral BID   furosemide  40 mg Intravenous Daily   levothyroxine   225 mcg Oral Q0600   pravastatin  40 mg Oral q1800   Continuous Infusions:  cefTRIAXone (ROCEPHIN)  IV 1 g (05/24/22 1302)     LOS: 2 days    Time spent: 43mn  PDomenic Polite MD [facility]   05/25/2022, 10:46 AM

## 2022-05-25 NOTE — Plan of Care (Signed)
?  Problem: Coping: ?Goal: Level of anxiety will decrease ?Outcome: Progressing ?  ?Problem: Safety: ?Goal: Ability to remain free from injury will improve ?Outcome: Progressing ?  ?

## 2022-05-25 NOTE — Progress Notes (Signed)
Progress Note  Patient Name: [name] Date of Encounter: 05/25/2022  [facility] Cardiologist: [name], MD    Subjective   88 year old female with a history of persistent atrial fibrillation, chronic combined CHF, right bundle branch block, hypertension, hyperlipidemia, hypothyroidism.  We are asked to see her for bradycardia and hypotension following the recent cardioversion at the request of Dr. Broadus John.  Heart rate is improved with discontinuation of metoprolol and amiodarone.  She is currently on Eliquis 2.5 mg a day and Lasix 40 mg IV a day, Synthroid 225 mcg a day.  The plan is to eventually restart amiodarone as tolerated.   Had severe slow HR last night  Atropine was given This caused dry mouth and gagging  She did not feel well with that   HR is in the 60 this afternoon.     Inpatient Medications    Scheduled Meds:  apixaban  2.5 mg Oral BID   furosemide  40 mg Intravenous Daily   levothyroxine  225 mcg Oral Q0600   pravastatin  40 mg Oral q1800   Continuous Infusions:  cefTRIAXone (ROCEPHIN)  IV 1 g (05/25/22 1342)   PRN Meds: acetaminophen, camphor-menthol, ondansetron (ZOFRAN) IV   Vital Signs    Vitals:   05/24/22 2300 05/25/22 0000 05/25/22 0407 05/25/22 0840  BP: (!) 141/59 (!) 139/53 (!) 133/53 124/69  Pulse: 65 66 62 61  Resp: '20 18 17 17  '$ Temp:   98.2 F (36.8 C) 98.5 F (36.9 C)  TempSrc:   Oral Oral  SpO2: 99% 97% 95% 98%  Weight:   93.6 kg   Height:        Intake/Output Summary (Last 24 hours) at 05/25/2022 1417 Last data filed at 05/25/2022 1409 Gross per 24 hour  Intake 1134 ml  Output 1800 ml  Net -666 ml       05/25/2022    4:07 AM 05/24/2022   12:27 AM 05/23/2022   12:36 AM  Last 3 Weights  Weight (lbs) 206 lb 5.6 oz 206 lb 2.1 oz 205 lb 4 oz  Weight (kg) 93.6 kg 93.5 kg 93.1 kg      Telemetry    NSR - Personally Reviewed  ECG     - Personally Reviewed  Physical Exam  Physical Exam: Blood  pressure 124/69, pulse 61, temperature 98.5 F (36.9 C), temperature source Oral, resp. rate 17, height '5\' 6"'$  (1.676 m), weight 93.6 kg, SpO2 98 %.  GEN: elderly female,  n no acute distress HEENT: Normal NECK: No JVD; No carotid bruits LYMPHATICS: No lymphadenopathy CARDIAC: RRR , no murmurs, rubs, gallops RESPIRATORY:  Clear to auscultation without rales, wheezing or rhonchi  ABDOMEN: Soft, non-tender, non-distended MUSCULOSKELETAL:  No edema; No deformity  SKIN: Warm and dry NEUROLOGIC:  Alert and oriented x 3  Labs    High Sensitivity Troponin:   Recent Labs  Lab 05/22/22 2010 05/22/22 2201  TROPONINIHS 6 9      Chemistry Recent Labs  Lab 05/22/22 2010 05/22/22 2027 05/22/22 2201 05/23/22 0155 05/24/22 0344 05/25/22 0450  NA 139   < >  --  132* 137 131*  K 4.2   < >  --  4.4 4.0 3.9  CL 108   < >  --  100 106 102  CO2 21*  --   --  '22 24 22  '$ GLUCOSE 99   < >  --  103* 100* 100*  BUN 25*   < >  --  25* 29* 25*  CREATININE 1.68*   < >  --  1.76* 1.76* 1.43*  CALCIUM 9.0  --   --  8.7* 8.6* 8.5*  MG  --   --  2.0  --   --   --   PROT 6.3*  --   --  6.4* 5.7*  --   ALBUMIN 3.6  --   --  3.5 3.1*  --   AST 51*  --   --  41 20  --   ALT 43  --   --  38 25  --   ALKPHOS 92  --   --  85 71  --   BILITOT 0.4  --   --  0.5 0.6  --   GFRNONAA 29*  --   --  27* 27* 35*  ANIONGAP 10  --   --  '10 7 7   '$ < > = values in this interval not displayed.     Lipids No results for input(s): "CHOL", "TRIG", "HDL", "LABVLDL", "LDLCALC", "CHOLHDL" in the last 168 hours.  Hematology Recent Labs  Lab 05/23/22 0155 05/24/22 0344 05/25/22 0450  WBC 7.5 6.9 7.5  RBC 4.47 4.05 4.00  HGB 12.4 11.0* 11.0*  HCT 37.9 34.6* 33.8*  MCV 84.8 85.4 84.5  MCH 27.7 27.2 27.5  MCHC 32.7 31.8 32.5  RDW 14.4 14.4 14.4  PLT 175 163 176    Thyroid  Recent Labs  Lab 05/23/22 0155 05/23/22 1704  TSH 13.968*  --   FREET4  --  1.40*     BNP Recent Labs  Lab 05/22/22 2011  BNP  335.8*     DDimer No results for input(s): "DDIMER" in the last 168 hours.   Radiology    No results found.  Cardiac Studies    Patient Profile     88 y.o. female    Assessment & Plan     Atrial fibrillation: She is status post successful cardioversion.  She has had bradycardia following that cardioversion.  Heart rate is up slightly from yesterday.  Is now in the 75s.  She did have some significant bradycardia yesterday and etiology of that is not entirely clear.  We will continue to allow the amiodarone to washout.  If she continues to have symptomatic bradycardia she may need a pacemaker placed.  2.  Deconditioning: She will be seen by PT and OT.  3.  Hypothyroidism: May possibly have been worsened with amiodarone therapy.  Further plans per Dr. Broadus John And team.        For questions or updates, please contact [place] Please consult [email or web address] for contact info under        Signed, Mertie Moores, MD  05/25/2022, 2:17 PM

## 2022-05-26 DIAGNOSIS — I480 Paroxysmal atrial fibrillation: Secondary | ICD-10-CM | POA: Diagnosis not present

## 2022-05-26 DIAGNOSIS — N390 Urinary tract infection, site not specified: Secondary | ICD-10-CM

## 2022-05-26 DIAGNOSIS — R001 Bradycardia, unspecified: Secondary | ICD-10-CM | POA: Diagnosis not present

## 2022-05-26 DIAGNOSIS — I495 Sick sinus syndrome: Secondary | ICD-10-CM | POA: Diagnosis not present

## 2022-05-26 DIAGNOSIS — I442 Atrioventricular block, complete: Secondary | ICD-10-CM | POA: Diagnosis not present

## 2022-05-26 LAB — BASIC METABOLIC PANEL
Anion gap: 9 (ref 5–15)
BUN: 20 mg/dL (ref 8–23)
CO2: 23 mmol/L (ref 22–32)
Calcium: 8.7 mg/dL — ABNORMAL LOW (ref 8.9–10.3)
Chloride: 103 mmol/L (ref 98–111)
Creatinine, Ser: 1.33 mg/dL — ABNORMAL HIGH (ref 0.44–1.00)
GFR, Estimated: 38 mL/min — ABNORMAL LOW (ref >60)
Glucose, Bld: 112 mg/dL — ABNORMAL HIGH (ref 70–99)
Potassium: 3.9 mmol/L (ref 3.5–5.1)
Sodium: 135 mmol/L (ref 135–145)

## 2022-05-26 LAB — SURGICAL PCR SCREEN
MRSA, PCR: NEGATIVE
Staphylococcus aureus: NEGATIVE

## 2022-05-26 MED ORDER — APIXABAN 2.5 MG PO TABS
2.5000 mg | ORAL_TABLET | Freq: Once | ORAL | Status: AC
  Administered 2022-05-26: 2.5 mg via ORAL
  Filled 2022-05-26: qty 1

## 2022-05-26 MED ORDER — CEFDINIR 300 MG PO CAPS
300.0000 mg | ORAL_CAPSULE | Freq: Two times a day (BID) | ORAL | Status: DC
  Administered 2022-05-26 – 2022-05-28 (×4): 300 mg via ORAL
  Filled 2022-05-26 (×4): qty 1

## 2022-05-26 MED ORDER — APIXABAN 5 MG PO TABS: 5.0000 mg | ORAL_TABLET | Freq: Two times a day (BID) | ORAL | Status: DC

## 2022-05-26 MED ORDER — CEPHALEXIN 250 MG PO CAPS
250.0000 mg | ORAL_CAPSULE | Freq: Three times a day (TID) | ORAL | Status: DC
  Administered 2022-05-26: 250 mg via ORAL
  Filled 2022-05-26: qty 1

## 2022-05-26 MED ORDER — POTASSIUM CHLORIDE CRYS ER 20 MEQ PO TBCR
40.0000 meq | EXTENDED_RELEASE_TABLET | Freq: Once | ORAL | Status: AC
Start: 2022-05-26 — End: 2022-05-26
  Administered 2022-05-26: 40 meq via ORAL
  Filled 2022-05-26: qty 2

## 2022-05-26 NOTE — TOC Progression Note (Addendum)
Transition of Care [facility]) - Progression Note    Patient Details  Name: [name] MRN: [id number] Date of Birth: [birth year]  Transition of Care [facility]) CM/SW [place], RN Phone Number:[phone]  05/26/2022, 12:16 PM  Clinical Narrative:    CM at bedside to offer choice for home health recommendations. CM offered choice per medicare.gov list. Patient and daughter state that they have previously had [facility] and would like to resume with them. Referral accepted by Claiborne Billings with [facility]. Orders have been entered and AVS updated.        Expected Discharge Plan and Services                                                 Social Determinants of Health (SDOH) Interventions    Readmission Risk Interventions     No data to display

## 2022-05-26 NOTE — Progress Notes (Signed)
Progress Note  Patient Name: [name] Date of Encounter: 05/26/2022  [facility] Cardiologist: [name], MD    Subjective   88 year old female with a history of persistent atrial fibrillation, chronic combined CHF, right bundle branch block, hypertension, hyperlipidemia, hypothyroidism.  We are asked to see her for bradycardia and hypotension following the recent cardioversion at the request of Dr. Broadus John.   [name] has had a stable heart rate over the last 24 hours.  Her main concern today is headache.  She has been started on cephalexin for UTI but her daughter tells me today she may have had rash or intolerant to that medicine before.  I will notify Dr. Broadus John.  Inpatient Medications    Scheduled Meds:  apixaban  2.5 mg Oral BID   cephALEXin  250 mg Oral Q8H   furosemide  40 mg Intravenous Daily   levothyroxine  225 mcg Oral Q0600   potassium chloride  40 mEq Oral Once   pravastatin  40 mg Oral q1800   Continuous Infusions:   PRN Meds: acetaminophen, camphor-menthol, ondansetron (ZOFRAN) IV   Vital Signs    Vitals:   05/25/22 0840 05/25/22 2022 05/26/22 0414 05/26/22 0814  BP: 124/69 (!) 133/57 101/88 (!) 116/41  Pulse: 61 62 65 63  Resp: '17 20 17 20  '$ Temp: 98.5 F (36.9 C) 99 F (37.2 C) 98.1 F (36.7 C) 98 F (36.7 C)  TempSrc: Oral Oral Oral Oral  SpO2: 98% 95% 96% 94%  Weight:   92.7 kg   Height:        Intake/Output Summary (Last 24 hours) at 05/26/2022 1032 Last data filed at 05/26/2022 0818 Gross per 24 hour  Intake 474 ml  Output 1250 ml  Net -776 ml      05/26/2022    4:14 AM 05/25/2022    4:07 AM 05/24/2022   12:27 AM  Last 3 Weights  Weight (lbs) 204 lb 5.9 oz 206 lb 5.6 oz 206 lb 2.1 oz  Weight (kg) 92.7 kg 93.6 kg 93.5 kg      Telemetry    NSR in the 50-60s - Personally Reviewed  ECG   N/A  Physical Exam  Physical Exam: Blood pressure (!) 116/41, pulse 63, temperature 98 F (36.7 C), temperature source  Oral, resp. rate 20, height '5\' 6"'$  (1.676 m), weight 92.7 kg, SpO2 94 %.  GEN: elderly female,  n no acute distress HEENT: Normal NECK: No JVD; No carotid bruits LYMPHATICS: No lymphadenopathy CARDIAC: RRR , no murmurs, rubs, gallops RESPIRATORY:  Clear to auscultation without rales, wheezing or rhonchi  ABDOMEN: Soft, non-tender, non-distended MUSCULOSKELETAL:  No edema; No deformity  SKIN: Warm and dry NEUROLOGIC:  Alert and oriented x 3  Labs    High Sensitivity Troponin:   Recent Labs  Lab 05/22/22 2010 05/22/22 2201  TROPONINIHS 6 9     Chemistry Recent Labs  Lab 05/22/22 2010 05/22/22 2027 05/22/22 2201 05/23/22 0155 05/24/22 0344 05/25/22 0450 05/26/22 0747  NA 139   < >  --  132* 137 131* 135  K 4.2   < >  --  4.4 4.0 3.9 3.9  CL 108   < >  --  100 106 102 103  CO2 21*  --   --  '22 24 22 23  '$ GLUCOSE 99   < >  --  103* 100* 100* 112*  BUN 25*   < >  --  25* 29* 25* 20  CREATININE 1.68*   < >  --  1.76* 1.76* 1.43* 1.33*  CALCIUM 9.0  --   --  8.7* 8.6* 8.5* 8.7*  MG  --   --  2.0  --   --   --   --   PROT 6.3*  --   --  6.4* 5.7*  --   --   ALBUMIN 3.6  --   --  3.5 3.1*  --   --   AST 51*  --   --  41 20  --   --   ALT 43  --   --  38 25  --   --   ALKPHOS 92  --   --  85 71  --   --   BILITOT 0.4  --   --  0.5 0.6  --   --   GFRNONAA 29*  --   --  27* 27* 35* 38*  ANIONGAP 10  --   --  '10 7 7 9   '$ < > = values in this interval not displayed.    Lipids No results for input(s): "CHOL", "TRIG", "HDL", "LABVLDL", "LDLCALC", "CHOLHDL" in the last 168 hours.  Hematology Recent Labs  Lab 05/23/22 0155 05/24/22 0344 05/25/22 0450  WBC 7.5 6.9 7.5  RBC 4.47 4.05 4.00  HGB 12.4 11.0* 11.0*  HCT 37.9 34.6* 33.8*  MCV 84.8 85.4 84.5  MCH 27.7 27.2 27.5  MCHC 32.7 31.8 32.5  RDW 14.4 14.4 14.4  PLT 175 163 176   Thyroid  Recent Labs  Lab 05/23/22 0155 05/23/22 1704  TSH 13.968*  --   FREET4  --  1.40*    BNP Recent Labs  Lab 05/22/22 2011  BNP  335.8*    DDimer No results for input(s): "DDIMER" in the last 168 hours.   Radiology    No results found.  Cardiac Studies    Patient Profile     88 y.o. female    Assessment & Plan     Atrial fibrillation: She is status post successful cardioversion.  She has had bradycardia following that cardioversion.  Heart rate is stable in low 60's - off amidarone  2.  Deconditioning: She will be seen by PT and OT.  3.  Hypothyroidism: May possibly have been worsened with amiodarone therapy.  Further plans per Dr. Broadus John And team.  4. Klebsiella UTI: on cephalexin per hospitalist service - daughter thinks patient may have had a reaction to this antibiotic in the past, will message Dr. Broadus John to evaluate further    No further suggestions at this time. Will sign off.  [facility] will sign off.   Medication Recommendations:  as above Other recommendations (labs, testing, etc):  none Follow up as an outpatient:  Dr. Oval Linsey on 9/1   For questions or updates, please contact [place] Please consult [email or web address] for contact info under   Pixie Casino, MD, FACC, [place] Director of the Advanced Lipid Disorders &  Cardiovascular Risk Reduction Clinic Diplomate of the American Board of Clinical Lipidology Attending Cardiologist  Direct Dial: [phone]  Fax: [phone]  Website:  [email or web address]  Pixie Casino, MD  05/26/2022, 10:32 AM

## 2022-05-26 NOTE — Progress Notes (Signed)
PROGRESS NOTE    [name]  NWG:[id number] DOB: 14-Apr-1931 DOA: 05/22/2022 PCP: Merrilee Seashore, MD  89/F with history of chronic systolic CHF, A-fib, hypothyroidism, mild memory loss, obesity, hypothyroidism underwent DC cardioversion yesterday morning, subsequently went home took a nap, woke up was extremely dizzy and weak her pulse was in the 30s and blood pressure in the 90s range, EMS was called she was noted to be bradycardic in the 30s, treated with 2 doses of atropine.  Per EDP some mention of complete heart block was given another dose of IV atropine, and admitted overnight. -Received 2 doses of Ativan and morphine-for some groin discomfort, noted to be more confused, EDP obtained MRI brain which was unremarkable, UA abnormal, started antibiotics for UTI   Subjective: -No further episodes of bradycardia, feels better this morning  Assessment and Plan:  Bradycardia and hypotension -Following cardioversion 8/17 -Metoprolol and amiodarone on hold, -Had recurrent bradycardia with heart rate in the 20-30 range 8/19 evening, patient was asymptomatic, improved after atropine -Appreciate cards input, now improving, hopefully does not need a pacer, monitor on telemetry 1 more day  Paroxysmal atrial fibrillation -Continue Eliquis, cardioversion as outpatient 8/17, in sinus rhythm now -Beta-blocker on hold, see above  Encephalopathy, delirium UTI -UA abnormal, started IV ceftriaxone urine cultures with Klebsiella, and sensitive, changed to Keflex, had MRI brain on admission which was unremarkable, mental status improved -Avoid sedating meds -Improving, increase activity, PT eval completed  Acute on chronic systolic CHF -EF 57%, volume status is improving -IV Lasix today, hold further doses, Entresto and beta-blocker on hold  Mild AKI -Likely secondary to hypotension, holding Entresto -Improving  Hypothyroidism -Likely worsened in the setting of amiodarone use -On  Synthroid, TSH is significantly elevated, Synthroid dose increased, recommend recheck TSH in 6 weeks   DVT prophylaxis: Apixaban Code Status: DNR, discussed CODE STATUS with the patient Family Communication: Daughter at bedside yesterday Disposition Plan: Home likely tomorrow  Consultants: Cards   Procedures:   Antimicrobials:    Objective: Vitals:   05/25/22 0840 05/25/22 2022 05/26/22 0414 05/26/22 0814  BP: 124/69 (!) 133/57 101/88 (!) 116/41  Pulse: 61 62 65 63  Resp: '17 20 17 20  '$ Temp: 98.5 F (36.9 C) 99 F (37.2 C) 98.1 F (36.7 C) 98 F (36.7 C)  TempSrc: Oral Oral Oral Oral  SpO2: 98% 95% 96% 94%  Weight:   92.7 kg   Height:        Intake/Output Summary (Last 24 hours) at 05/26/2022 1145 Last data filed at 05/26/2022 0818 Gross per 24 hour  Intake 474 ml  Output 1250 ml  Net -776 ml   Filed Weights   05/24/22 0027 05/25/22 0407 05/26/22 0414  Weight: 93.5 kg 93.6 kg 92.7 kg    Examination:  General exam: Obese chronically ill female sitting up in bed, AAOx3, no distress HEENT: Positive JVD CVS: S1-S2, regular rhythm Lungs: Fine bilateral rales Abdomen: Soft, nontender, bowel sounds present Extremities: No edema  Neuro: Moves all extremities, no localizing signs Skin: No rashes Psychiatry:  Mood & affect appropriate.     Data Reviewed:   CBC: Recent Labs  Lab 05/22/22 2010 05/22/22 2027 05/23/22 0155 05/24/22 0344 05/25/22 0450  WBC 6.8  --  7.5 6.9 7.5  NEUTROABS 3.9  --   --   --   --   HGB 12.4 13.3 12.4 11.0* 11.0*  HCT 40.2 39.0 37.9 34.6* 33.8*  MCV 88.0  --  84.8 85.4 84.5  PLT  208  --  175 163 177   Basic Metabolic Panel: Recent Labs  Lab 05/22/22 2010 05/22/22 2027 05/22/22 2201 05/23/22 0155 05/24/22 0344 05/25/22 0450 05/26/22 0747  NA 139 138  --  132* 137 131* 135  K 4.2 4.1  --  4.4 4.0 3.9 3.9  CL 108 104  --  100 106 102 103  CO2 21*  --   --  '22 24 22 23  '$ GLUCOSE 99 91  --  103* 100* 100* 112*  BUN 25*  28*  --  25* 29* 25* 20  CREATININE 1.68* 1.70*  --  1.76* 1.76* 1.43* 1.33*  CALCIUM 9.0  --   --  8.7* 8.6* 8.5* 8.7*  MG  --   --  2.0  --   --   --   --    GFR: Estimated Creatinine Clearance: 33.6 mL/min (A) (by C-G formula based on SCr of 1.33 mg/dL (H)). Liver Function Tests: Recent Labs  Lab 05/22/22 2010 05/23/22 0155 05/24/22 0344  AST 51* 41 20  ALT 43 38 25  ALKPHOS 92 85 71  BILITOT 0.4 0.5 0.6  PROT 6.3* 6.4* 5.7*  ALBUMIN 3.6 3.5 3.1*   No results for input(s): "LIPASE", "AMYLASE" in the last 168 hours. No results for input(s): "AMMONIA" in the last 168 hours. Coagulation Profile: No results for input(s): "INR", "PROTIME" in the last 168 hours. Cardiac Enzymes: No results for input(s): "CKTOTAL", "CKMB", "CKMBINDEX", "TROPONINI" in the last 168 hours. BNP (last 3 results) No results for input(s): "PROBNP" in the last 8760 hours. HbA1C: No results for input(s): "HGBA1C" in the last 72 hours. CBG: Recent Labs  Lab 05/22/22 2352 05/23/22 0606 05/23/22 1122 05/23/22 2113  GLUCAP 103* 87 87 118*   Lipid Profile: No results for input(s): "CHOL", "HDL", "LDLCALC", "TRIG", "CHOLHDL", "LDLDIRECT" in the last 72 hours. Thyroid Function Tests: Recent Labs    05/23/22 1704  FREET4 1.40*  T3FREE 1.3*   Anemia Panel: No results for input(s): "VITAMINB12", "FOLATE", "FERRITIN", "TIBC", "IRON", "RETICCTPCT" in the last 72 hours. Urine analysis:    Component Value Date/Time   COLORURINE YELLOW 05/23/2022 0922   APPEARANCEUR CLOUDY (A) 05/23/2022 0922   APPEARANCEUR Clear 01/08/2022 1554   LABSPEC 1.015 05/23/2022 0922   PHURINE 5.0 05/23/2022 0922   GLUCOSEU NEGATIVE 05/23/2022 0922   HGBUR SMALL (A) 05/23/2022 0922   BILIRUBINUR NEGATIVE 05/23/2022 0922   BILIRUBINUR Negative 01/08/2022 [place] 05/23/2022 0922   PROTEINUR 100 (A) 05/23/2022 0922   NITRITE NEGATIVE 05/23/2022 0922   LEUKOCYTESUR LARGE (A) 05/23/2022 0922   Sepsis  Labs: '@LABRCNTIP'$ (procalcitonin:4,lacticidven:4)  ) Recent Results (from the past 240 hour(s))  Resp Panel by RT-PCR (Flu A&B, Covid) Anterior Nasal Swab     Status: None   Collection Time: 05/22/22  8:11 PM   Specimen: Anterior Nasal Swab  Result Value Ref Range Status   SARS Coronavirus 2 by RT PCR NEGATIVE NEGATIVE Final    Comment: (NOTE) SARS-CoV-2 target nucleic acids are NOT DETECTED.  The SARS-CoV-2 RNA is generally detectable in upper respiratory specimens during the acute phase of infection. The lowest concentration of SARS-CoV-2 viral copies this assay can detect is 138 copies/mL. A negative result does not preclude SARS-Cov-2 infection and should not be used as the sole basis for treatment or other patient management decisions. A negative result may occur with  improper specimen collection/handling, submission of specimen other than nasopharyngeal swab, presence of viral mutation(s) within the areas targeted  by this assay, and inadequate number of viral copies(<138 copies/mL). A negative result must be combined with clinical observations, patient history, and epidemiological information. The expected result is Negative.  Fact Sheet for Patients:  EntrepreneurPulse.com.au  Fact Sheet for Healthcare Providers:  IncredibleEmployment.be  This test is no t yet approved or cleared by the [place] FDA and  has been authorized for detection and/or diagnosis of SARS-CoV-2 by FDA under an Emergency Use Authorization (EUA). This EUA will remain  in effect (meaning this test can be used) for the duration of the COVID-19 declaration under Section 564(b)(1) of the Act, 21 U.S.C.section 360bbb-3(b)(1), unless the authorization is terminated  or revoked sooner.       Influenza A by PCR NEGATIVE NEGATIVE Final   Influenza B by PCR NEGATIVE NEGATIVE Final    Comment: (NOTE) The Xpert Xpress SARS-CoV-2/FLU/RSV plus assay is intended as an  aid in the diagnosis of influenza from Nasopharyngeal swab specimens and should not be used as a sole basis for treatment. Nasal washings and aspirates are unacceptable for Xpert Xpress SARS-CoV-2/FLU/RSV testing.  Fact Sheet for Patients: EntrepreneurPulse.com.au  Fact Sheet for Healthcare Providers: IncredibleEmployment.be  This test is not yet approved or cleared by the [place] FDA and has been authorized for detection and/or diagnosis of SARS-CoV-2 by FDA under an Emergency Use Authorization (EUA). This EUA will remain in effect (meaning this test can be used) for the duration of the COVID-19 declaration under Section 564(b)(1) of the Act, 21 U.S.C. section 360bbb-3(b)(1), unless the authorization is terminated or revoked.  Performed at [facility], [place] [address] [place], [place] [address]   Urine Culture     Status: Abnormal   Collection Time: 05/23/22  8:49 AM   Specimen: Urine, Clean Catch  Result Value Ref Range Status   Specimen Description URINE, CLEAN CATCH  Final   Special Requests   Final    NONE Performed at [facility], [place] [address], [place], [place] [address]    Culture >=100,000 COLONIES/mL KLEBSIELLA PNEUMONIAE (A)  Final   Report Status 05/25/2022 FINAL  Final   Organism ID, Bacteria KLEBSIELLA PNEUMONIAE (A)  Final      Susceptibility   Klebsiella pneumoniae - MIC*    AMPICILLIN RESISTANT Resistant     CEFAZOLIN <=4 SENSITIVE Sensitive     CEFEPIME <=0.12 SENSITIVE Sensitive     CEFTRIAXONE <=0.25 SENSITIVE Sensitive     CIPROFLOXACIN <=0.25 SENSITIVE Sensitive     GENTAMICIN <=1 SENSITIVE Sensitive     IMIPENEM <=0.25 SENSITIVE Sensitive     NITROFURANTOIN <=16 SENSITIVE Sensitive     TRIMETH/SULFA <=20 SENSITIVE Sensitive     AMPICILLIN/SULBACTAM 4 SENSITIVE Sensitive     PIP/TAZO <=4 SENSITIVE Sensitive     * >=100,000 COLONIES/mL KLEBSIELLA PNEUMONIAE     Radiology Studies: No  results found.   Scheduled Meds:  apixaban  2.5 mg Oral BID   cephALEXin  250 mg Oral Q8H   furosemide  40 mg Intravenous Daily   levothyroxine  225 mcg Oral Q0600   pravastatin  40 mg Oral q1800   Continuous Infusions:     LOS: 3 days    Time spent: 73mn  PDomenic Polite MD [facility]   05/26/2022, 11:45 AM

## 2022-05-26 NOTE — Progress Notes (Signed)
    Called back to the bedside as HR reported to be in the 30-40's - review of telemetry shows paroxysmal afib with slow ventricular response, alternating with sinus rhythm in the 50's-60's. She was reportedly dizzy, weak and presyncopal with the bradycardia. BP was stable. Amiodarone is being held- no current AVN blocking agents. Plan was washout AAD therapy, then consider whether she may need PPM.  Will d/w EP service to determine options.  Pixie Casino, MD, [facility], [place] Director of the Advanced Lipid Disorders &  Cardiovascular Risk Reduction Clinic Diplomate of the American Board of Clinical Lipidology Attending Cardiologist  Direct Dial: [phone]  Fax: [phone]  Website:  [email or web address]

## 2022-05-26 NOTE — Progress Notes (Signed)
Nurse paged reporting patient continue to have bradycardia with HR 20-30s intermittently CHB. She is waiting for PPM placement tomorrow. She has intermittent dizziness when HR is slow, but resolves after HR improves. BP is stable with SBP >110s. Advised RN to attach Zolls on the patient overnight. If patient has sustained CHB with symptoms, will need to transcutaneously pace the patient, otherwise plan per EP. Notified primary cardiologist Dr Debara Pickett.

## 2022-05-26 NOTE — Progress Notes (Signed)
Mobility Specialist: Progress Note   05/26/22 1121  Mobility  Activity Ambulated with assistance in hallway  Level of Assistance Contact guard assist, steadying assist  Assistive Device Four wheel walker  Distance Ambulated (ft) 100 ft  Activity Response Tolerated well  $Mobility charge 1 Mobility   Pre-Mobility: 69 HR, 96% SpO2 Post-Mobility: 61 HR, 97% SpO2  Pt received in the bed and agreeable to mobility. C/o mild SOB as well as bilateral knee pain during ambulation. Pt back to bed after session with call bell at her side and family present in the room.   [facility] [place] Mobility Specialist Mobility Specialist [address]: [phone]
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
## 2022-05-26 NOTE — Significant Event (Signed)
Rapid Response Event Note   Reason for Call :  Bradycardia   Initial Focused Assessment:  On arrival, pt sitting up in bed, A&O with family at bedside. No c/o pain or distress noted. HR 48, BP 97/40. She appears to be in and out of afib/SB with rate between 40-60. Pt denies feeling lightheaded or dizzy at this time but she did when her HR was in the 30s (did not sustain). Pt states she does not want atropine because it made her sick the last time it was given.    Interventions:  EKG- afib with slow ventricular response Pacer pads at bedside Cardiology notified and to bedside  Plan of Care:  Continue to monitor HR and BP. Keep pacer pads at bedside if pt HR remains low and she becomes symptomatic, she may need pacing and/or atropine. Per Md, will continue to monitor for now. RN instructed to call with any changes or concerns.     Event Summary:   MD Notified: Cardiology- [place] Call Time: [place] Time: 1312 End Time: 2836  Sherilyn Dacosta, RN

## 2022-05-26 NOTE — Progress Notes (Signed)
Patient HR drop to 20-30 symptomatic Card NP and Rapid RN  notified. V/SS.will applied Zoll pad applied per NP. Will continue to monitor.

## 2022-05-27 ENCOUNTER — Encounter (HOSPITAL_COMMUNITY)
Admission: EM | Disposition: A | Discharge status: Home / Self Care | Payer: Self-pay | Source: Home / Self Care | Attending: Internal Medicine

## 2022-05-27 DIAGNOSIS — R001 Bradycardia, unspecified: Secondary | ICD-10-CM | POA: Diagnosis not present

## 2022-05-27 DIAGNOSIS — I480 Paroxysmal atrial fibrillation: Secondary | ICD-10-CM | POA: Diagnosis not present

## 2022-05-27 DIAGNOSIS — I495 Sick sinus syndrome: Secondary | ICD-10-CM | POA: Diagnosis not present

## 2022-05-27 HISTORY — PX: PACEMAKER IMPLANT: EP1218

## 2022-05-27 LAB — BASIC METABOLIC PANEL
Anion gap: 4 — ABNORMAL LOW (ref 5–15)
BUN: 19 mg/dL (ref 8–23)
CO2: 23 mmol/L (ref 22–32)
Calcium: 8.4 mg/dL — ABNORMAL LOW (ref 8.9–10.3)
Chloride: 107 mmol/L (ref 98–111)
Creatinine, Ser: 1.26 mg/dL — ABNORMAL HIGH (ref 0.44–1.00)
GFR, Estimated: 41 mL/min — ABNORMAL LOW (ref >60)
Glucose, Bld: 90 mg/dL (ref 70–99)
Potassium: 4.2 mmol/L (ref 3.5–5.1)
Sodium: 134 mmol/L — ABNORMAL LOW (ref 135–145)

## 2022-05-27 SURGERY — PACEMAKER IMPLANT

## 2022-05-27 MED ORDER — HEPARIN (PORCINE) IN NACL 1000-0.9 UT/500ML-% IV SOLN
INTRAVENOUS | Status: AC
  Filled 2022-05-27: qty 500

## 2022-05-27 MED ORDER — SODIUM CHLORIDE 0.9 % IV SOLN: INTRAVENOUS | Status: DC

## 2022-05-27 MED ORDER — SODIUM CHLORIDE 0.9 % IV SOLN
INTRAVENOUS | Status: AC
  Filled 2022-05-27: qty 2

## 2022-05-27 MED ORDER — IOHEXOL 350 MG/ML SOLN
INTRAVENOUS | Status: DC | PRN
  Administered 2022-05-27: 15 mL

## 2022-05-27 MED ORDER — VANCOMYCIN HCL IN DEXTROSE 1-5 GM/200ML-% IV SOLN
INTRAVENOUS | Status: AC
  Filled 2022-05-27: qty 200

## 2022-05-27 MED ORDER — GUAIFENESIN-DM 100-10 MG/5ML PO SYRP
5.0000 mL | ORAL_SOLUTION | ORAL | Status: DC | PRN
  Administered 2022-05-27: 5 mL via ORAL
  Filled 2022-05-27: qty 5

## 2022-05-27 MED ORDER — VANCOMYCIN HCL IN DEXTROSE 1-5 GM/200ML-% IV SOLN
1000.0000 mg | INTRAVENOUS | Status: AC
  Administered 2022-05-27: 1000 mg via INTRAVENOUS

## 2022-05-27 MED ORDER — LIDOCAINE HCL (PF) 1 % IJ SOLN
INTRAMUSCULAR | Status: DC | PRN
  Administered 2022-05-27: 50 mL

## 2022-05-27 MED ORDER — SODIUM CHLORIDE 0.9 % IV SOLN
80.0000 mg | INTRAVENOUS | Status: AC
  Administered 2022-05-27: 80 mg
  Filled 2022-05-27: qty 2

## 2022-05-27 MED ORDER — LIDOCAINE HCL (PF) 1 % IJ SOLN
INTRAMUSCULAR | Status: AC
  Filled 2022-05-27: qty 60

## 2022-05-27 MED ORDER — VANCOMYCIN HCL IN DEXTROSE 1-5 GM/200ML-% IV SOLN
1000.0000 mg | Freq: Two times a day (BID) | INTRAVENOUS | Status: AC
  Administered 2022-05-28: 1000 mg via INTRAVENOUS
  Filled 2022-05-27: qty 200

## 2022-05-27 MED ORDER — HEPARIN (PORCINE) IN NACL 1000-0.9 UT/500ML-% IV SOLN
INTRAVENOUS | Status: DC | PRN
  Administered 2022-05-27: 500 mL

## 2022-05-27 SURGICAL SUPPLY — 14 items
CABLE SURGICAL S-101-97-12 (CABLE) ×1 IMPLANT
CATH RIGHTSITE C315HIS02 (CATHETERS) IMPLANT
IPG PACE AZUR XT DR MRI W1DR01 (Pacemaker) IMPLANT
LEAD CAPSURE NOVUS 5076-52CM (Lead) IMPLANT
LEAD SELECT SECURE 3830 383069 (Lead) IMPLANT
MAT PREVALON FULL STRYKER (MISCELLANEOUS) IMPLANT
PACE AZURE XT DR MRI W1DR01 (Pacemaker) ×1 IMPLANT
PAD DEFIB RADIO PHYSIO CONN (PAD) ×1 IMPLANT
SELECT SECURE 3830 383069 (Lead) ×1 IMPLANT
SHEATH 7FR PRELUDE SNAP 13 (SHEATH) IMPLANT
SHEATH PROBE COVER 6X72 (BAG) IMPLANT
SLITTER 6232ADJ (MISCELLANEOUS) IMPLANT
TRAY PACEMAKER INSERTION (PACKS) ×1 IMPLANT
WIRE HI TORQ VERSACORE-J 145CM (WIRE) IMPLANT

## 2022-05-27 NOTE — Progress Notes (Signed)
  Progress Note   Date: 05/27/2022  Patient Name: [name]        MRN#: [id number]  Clarification of diagnosis:  CKD Stage IIIa  Of note, at the time of the initial consult on 05/23/2022, she had acute kidney injury with underlying CKD stage IIIa. However, since that time it looks like most recent GFR ranging between 30 and 44 which would make her CKD stage IIIb.  Darreld Mclean, PA-C 05/27/2022 10:15 AM

## 2022-05-27 NOTE — Interval H&P Note (Signed)
History and Physical Interval Note:  05/27/2022 10:13 AM  [name]  has presented today for surgery, with the diagnosis of tach.  The various methods of treatment have been discussed with the patient and family. After consideration of risks, benefits and other options for treatment, the patient has consented to  Procedure(s): PACEMAKER IMPLANT (N/A) as a surgical intervention.  The patient's history has been reviewed, patient examined, no change in status, stable for surgery.  I have reviewed the patient's chart and labs.  Questions were answered to the patient's satisfaction.     Tarin Johndrow [employer]

## 2022-05-27 NOTE — Progress Notes (Signed)
Physical Therapy Treatment Patient Details Name: [name] MRN: [id number] DOB: April 27, 1931 Today's Date: 05/27/2022   History of Present Illness 88 y/o female presented to ED on 05/22/22 with bradycardia (HR 30s) and hypotension. Recent cardioversion early on 8/17. Had another episode of brady on 05/26/22.  PMH: CHF, Afib, mild memory loss, HTN    PT Comments    Pt received in bed, c/o itching today from meds she received last night and also due to contact with medsh underwear, zoll pads, tape etc. Mesh pants removed, rubbing to pt's back, encouraged her not to scratch as there are visible marks where she has done that today. Mobility limited to EOB and standing bedside today which patient tolerated well. Will progress again once pt has procedure that will be later today. PT will continue to follow.   Recommendations for follow up therapy are one component of a multi-disciplinary discharge planning process, led by the attending physician.  Recommendations may be updated based on patient status, additional functional criteria and insurance authorization.  Follow Up Recommendations  Home health PT     Assistance Recommended at Discharge Frequent or constant Supervision/Assistance  Patient can return home with the following A lot of help with walking and/or transfers;A little help with bathing/dressing/bathroom;Assistance with cooking/housework;Assist for transportation;Help with stairs or ramp for entrance;Direct supervision/assist for medications management;Direct supervision/assist for financial management   Equipment Recommendations  None recommended by PT    Recommendations for Other Services       Precautions / Restrictions Precautions Precautions: Fall Precaution Comments: watch HR and O2 Restrictions Weight Bearing Restrictions: No     Mobility  Bed Mobility Overal bed mobility: Needs Assistance Bed Mobility: Supine to Sit     Supine to sit: Min assist, HOB  elevated Sit to supine: Min assist   General bed mobility comments: min A to trunk for supine to sit and for LE's for return to supine from sitting.    Transfers Overall transfer level: Needs assistance Equipment used: Rolling walker (2 wheels) Transfers: Sit to/from Stand Sit to Stand: Min assist           General transfer comment: min A for safety. Performed several times for strenghening    Ambulation/Gait               General Gait Details: deferred due to RN request secondary to last night events   Stairs             Wheelchair Mobility    Modified Rankin (Stroke Patients Only)       Balance Overall balance assessment: Needs assistance Sitting-balance support: No upper extremity supported, Feet supported Sitting balance-Leahy Scale: Good     Standing balance support: Bilateral upper extremity supported, Reliant on assistive device for balance Standing balance-Leahy Scale: Poor Standing balance comment: reliant on UE support                            Cognition Arousal/Alertness: Awake/alert Behavior During Therapy: WFL for tasks assessed/performed Overall Cognitive Status: History of cognitive impairments - at baseline                                 General Comments: hx of memory loss        Exercises      General Comments General comments (skin integrity, edema, etc.): HR maintained in 60's. Pt fatigued with all  activity. Pt with much itching today from meds and also xoll pads still being on      Pertinent Vitals/Pain Pain Assessment Pain Assessment: No/denies pain    Home Living                          Prior Function            PT Goals (current goals can now be found in the care plan section) Acute Rehab PT Goals Patient Stated Goal: to go home PT Goal Formulation: With patient/family Time For Goal Achievement: 06/07/22 Potential to Achieve Goals: Fair Progress towards PT goals:  Progressing toward goals    Frequency    Min 3X/week      PT Plan Current plan remains appropriate    Co-evaluation              AM-PAC PT "6 Clicks" Mobility   Outcome Measure  Help needed turning from your back to your side while in a flat bed without using bedrails?: A Lot Help needed moving from lying on your back to sitting on the side of a flat bed without using bedrails?: A Lot Help needed moving to and from a bed to a chair (including a wheelchair)?: A Little Help needed standing up from a chair using your arms (e.g., wheelchair or bedside chair)?: A Little Help needed to walk in hospital room?: A Lot Help needed climbing 3-5 steps with a railing? : A Lot 6 Click Score: 14    End of Session Equipment Utilized During Treatment: Gait belt Activity Tolerance: Patient limited by fatigue Patient left: in bed;with call bell/phone within reach;with family/visitor present Nurse Communication: Mobility status PT Visit Diagnosis: Unsteadiness on feet (R26.81);Muscle weakness (generalized) (M62.81);Difficulty in walking, not elsewhere classified (R26.2)     Time: 9758-8325 PT Time Calculation (min) (ACUTE ONLY): 34 min  Charges:  $Therapeutic Activity: 23-37 mins                     Leighton Roach, PT  Acute Rehab Services Secure chat preferred Office [place] 05/27/2022, 1:27 PM

## 2022-05-27 NOTE — Progress Notes (Signed)
PROGRESS NOTE    JACKILYN UMPHLETT  EXB:[id number] DOB: 05-May-1931 DOA: 05/22/2022 PCP: Merrilee Seashore, MD  88/F with history of chronic systolic CHF, A-fib, hypothyroidism, mild memory loss, obesity, hypothyroidism underwent DC cardioversion yesterday morning, subsequently went home took a nap, woke up was extremely dizzy and weak her pulse was in the 30s and blood pressure in the 90s range, EMS was called she was noted to be bradycardic in the 30s, treated with 2 doses of atropine.  Per EDP some mention of complete heart block was given another dose of IV atropine, and admitted overnight. -Received 2 doses of Ativan and morphine-for some groin discomfort, noted to be more confused, EDP obtained MRI brain which was unremarkable, UA abnormal, started antibiotics for UTI -Recurrent episodes of profound sinus bradycardia, heart block  Subjective: -Had 3 episodes of severe bradycardia and an episode of transient complete heart block overnight  Assessment and Plan:  Bradycardia and hypotension -Following cardioversion 8/17 -Metoprolol and amiodarone on hold, -Had recurrent bradycardia with heart rate in the 20-30 range 8/19 evening, and many on 8/21 patient  -Appreciate cards input, plan for PPM today  Paroxysmal atrial fibrillation -Hold Eliquis dose this morning, cardioversion as outpatient 8/17, sinus bradycardia -Beta-blocker on hold, see above  Encephalopathy, delirium UTI -UA abnormal, started IV ceftriaxone urine cultures with Klebsiella, and sensitive, patient reportedly had problems tolerating Keflex in the past, changed to cefdinir, continue for 2 more days to complete 5-day course  -Mental status has improved -Avoid sedating meds -increase activity, PT eval completed  Acute on chronic systolic CHF -EF 10%, volume status is improving -She is 3.5 L negative, clinically improving, hold further Lasix today, resume Entresto tomorrow  Mild AKI -Likely secondary to  hypotension, holding Entresto -Improving  Hypothyroidism -Likely worsened in the setting of amiodarone use -On Synthroid, TSH is significantly elevated, Synthroid dose increased, recommend recheck TSH in 6 weeks   DVT prophylaxis: Apixaban Code Status: DNR, discussed CODE STATUS with the patient Family Communication: Daughter at bedside  Disposition Plan: Home likely 1 to 2 days  Consultants: Cards   Procedures:   Antimicrobials:    Objective: Vitals:   05/27/22 0506 05/27/22 0524 05/27/22 0525 05/27/22 1100  BP:   (!) 133/56 (!) 131/56  Pulse:   (!) 57 61  Resp:   19 20  Temp:  97.7 F (36.5 C)  97.9 F (36.6 C)  TempSrc:  Oral  Oral  SpO2:   96% 97%  Weight: 95.7 kg     Height:        Intake/Output Summary (Last 24 hours) at 05/27/2022 1154 Last data filed at 05/27/2022 1148 Gross per 24 hour  Intake 600 ml  Output 2650 ml  Net -2050 ml   Filed Weights   05/26/22 0414 05/27/22 0133 05/27/22 0506  Weight: 92.7 kg 92.7 kg 95.7 kg    Examination:  General exam: Obese pleasant female sitting up in bed, tired appearing, AAOx3 CV: S1-S2, regular rhythm, bradycardic Lungs: Rare basilar Rales otherwise clear Abdomen: Soft, nontender, bowel sounds present Extremities: No edema  Neuro: Moves all extremities, no localizing signs Skin: No rashes Psychiatry:  Mood & affect appropriate.     Data Reviewed:   CBC: Recent Labs  Lab 05/22/22 2010 05/22/22 2027 05/23/22 0155 05/24/22 0344 05/25/22 0450  WBC 6.8  --  7.5 6.9 7.5  NEUTROABS 3.9  --   --   --   --   HGB 12.4 13.3 12.4 11.0* 11.0*  HCT 40.2  39.0 37.9 34.6* 33.8*  MCV 88.0  --  84.8 85.4 84.5  PLT 208  --  175 163 086   Basic Metabolic Panel: Recent Labs  Lab 05/22/22 2201 05/23/22 0155 05/24/22 0344 05/25/22 0450 05/26/22 0747 05/27/22 0430  NA  --  132* 137 131* 135 134*  K  --  4.4 4.0 3.9 3.9 4.2  CL  --  100 106 102 103 107  CO2  --  '22 24 22 23 23  '$ GLUCOSE  --  103* 100* 100*  112* 90  BUN  --  25* 29* 25* 20 19  CREATININE  --  1.76* 1.76* 1.43* 1.33* 1.26*  CALCIUM  --  8.7* 8.6* 8.5* 8.7* 8.4*  MG 2.0  --   --   --   --   --    GFR: Estimated Creatinine Clearance: 36 mL/min (A) (by C-G formula based on SCr of 1.26 mg/dL (H)). Liver Function Tests: Recent Labs  Lab 05/22/22 2010 05/23/22 0155 05/24/22 0344  AST 51* 41 20  ALT 43 38 25  ALKPHOS 92 85 71  BILITOT 0.4 0.5 0.6  PROT 6.3* 6.4* 5.7*  ALBUMIN 3.6 3.5 3.1*   No results for input(s): "LIPASE", "AMYLASE" in the last 168 hours. No results for input(s): "AMMONIA" in the last 168 hours. Coagulation Profile: No results for input(s): "INR", "PROTIME" in the last 168 hours. Cardiac Enzymes: No results for input(s): "CKTOTAL", "CKMB", "CKMBINDEX", "TROPONINI" in the last 168 hours. BNP (last 3 results) No results for input(s): "PROBNP" in the last 8760 hours. HbA1C: No results for input(s): "HGBA1C" in the last 72 hours. CBG: Recent Labs  Lab 05/22/22 2352 05/23/22 0606 05/23/22 1122 05/23/22 2113  GLUCAP 103* 87 87 118*   Lipid Profile: No results for input(s): "CHOL", "HDL", "LDLCALC", "TRIG", "CHOLHDL", "LDLDIRECT" in the last 72 hours. Thyroid Function Tests: No results for input(s): "TSH", "T4TOTAL", "FREET4", "T3FREE", "THYROIDAB" in the last 72 hours.  Anemia Panel: No results for input(s): "VITAMINB12", "FOLATE", "FERRITIN", "TIBC", "IRON", "RETICCTPCT" in the last 72 hours. Urine analysis:    Component Value Date/Time   COLORURINE YELLOW 05/23/2022 0922   APPEARANCEUR CLOUDY (A) 05/23/2022 0922   APPEARANCEUR Clear 01/08/2022 1554   LABSPEC 1.015 05/23/2022 0922   PHURINE 5.0 05/23/2022 0922   GLUCOSEU NEGATIVE 05/23/2022 0922   HGBUR SMALL (A) 05/23/2022 0922   BILIRUBINUR NEGATIVE 05/23/2022 0922   BILIRUBINUR Negative 01/08/2022 [place] 05/23/2022 0922   PROTEINUR 100 (A) 05/23/2022 0922   NITRITE NEGATIVE 05/23/2022 0922   LEUKOCYTESUR LARGE (A)  05/23/2022 0922   Sepsis Labs: '@LABRCNTIP'$ (procalcitonin:4,lacticidven:4)  ) Recent Results (from the past 240 hour(s))  Resp Panel by RT-PCR (Flu A&B, Covid) Anterior Nasal Swab     Status: None   Collection Time: 05/22/22  8:11 PM   Specimen: Anterior Nasal Swab  Result Value Ref Range Status   SARS Coronavirus 2 by RT PCR NEGATIVE NEGATIVE Final    Comment: (NOTE) SARS-CoV-2 target nucleic acids are NOT DETECTED.  The SARS-CoV-2 RNA is generally detectable in upper respiratory specimens during the acute phase of infection. The lowest concentration of SARS-CoV-2 viral copies this assay can detect is 138 copies/mL. A negative result does not preclude SARS-Cov-2 infection and should not be used as the sole basis for treatment or other patient management decisions. A negative result may occur with  improper specimen collection/handling, submission of specimen other than nasopharyngeal swab, presence of viral mutation(s) within the areas targeted by this  assay, and inadequate number of viral copies(<138 copies/mL). A negative result must be combined with clinical observations, patient history, and epidemiological information. The expected result is Negative.  Fact Sheet for Patients:  EntrepreneurPulse.com.au  Fact Sheet for Healthcare Providers:  IncredibleEmployment.be  This test is no t yet approved or cleared by the [place] FDA and  has been authorized for detection and/or diagnosis of SARS-CoV-2 by FDA under an Emergency Use Authorization (EUA). This EUA will remain  in effect (meaning this test can be used) for the duration of the COVID-19 declaration under Section 564(b)(1) of the Act, 21 U.S.C.section 360bbb-3(b)(1), unless the authorization is terminated  or revoked sooner.       Influenza A by PCR NEGATIVE NEGATIVE Final   Influenza B by PCR NEGATIVE NEGATIVE Final    Comment: (NOTE) The Xpert Xpress SARS-CoV-2/FLU/RSV  plus assay is intended as an aid in the diagnosis of influenza from Nasopharyngeal swab specimens and should not be used as a sole basis for treatment. Nasal washings and aspirates are unacceptable for Xpert Xpress SARS-CoV-2/FLU/RSV testing.  Fact Sheet for Patients: EntrepreneurPulse.com.au  Fact Sheet for Healthcare Providers: IncredibleEmployment.be  This test is not yet approved or cleared by the [place] FDA and has been authorized for detection and/or diagnosis of SARS-CoV-2 by FDA under an Emergency Use Authorization (EUA). This EUA will remain in effect (meaning this test can be used) for the duration of the COVID-19 declaration under Section 564(b)(1) of the Act, 21 U.S.C. section 360bbb-3(b)(1), unless the authorization is terminated or revoked.  Performed at [facility], [place] [address] [place], [place] [address]   Urine Culture     Status: Abnormal   Collection Time: 05/23/22  8:49 AM   Specimen: Urine, Clean Catch  Result Value Ref Range Status   Specimen Description URINE, CLEAN CATCH  Final   Special Requests   Final    NONE Performed at [facility], [place] [address], [place], [place] [address]    Culture >=100,000 COLONIES/mL KLEBSIELLA PNEUMONIAE (A)  Final   Report Status 05/25/2022 FINAL  Final   Organism ID, Bacteria KLEBSIELLA PNEUMONIAE (A)  Final      Susceptibility   Klebsiella pneumoniae - MIC*    AMPICILLIN RESISTANT Resistant     CEFAZOLIN <=4 SENSITIVE Sensitive     CEFEPIME <=0.12 SENSITIVE Sensitive     CEFTRIAXONE <=0.25 SENSITIVE Sensitive     CIPROFLOXACIN <=0.25 SENSITIVE Sensitive     GENTAMICIN <=1 SENSITIVE Sensitive     IMIPENEM <=0.25 SENSITIVE Sensitive     NITROFURANTOIN <=16 SENSITIVE Sensitive     TRIMETH/SULFA <=20 SENSITIVE Sensitive     AMPICILLIN/SULBACTAM 4 SENSITIVE Sensitive     PIP/TAZO <=4 SENSITIVE Sensitive     * >=100,000 COLONIES/mL KLEBSIELLA PNEUMONIAE   Surgical PCR screen     Status: None   Collection Time: 05/26/22  7:05 PM   Specimen: Nasal Mucosa; Nasal Swab  Result Value Ref Range Status   MRSA, PCR NEGATIVE NEGATIVE Final   Staphylococcus aureus NEGATIVE NEGATIVE Final    Comment: (NOTE) The Xpert SA Assay (FDA approved for NASAL specimens in patients 29 years of age and older), is one component of a comprehensive surveillance program. It is not intended to diagnose infection nor to guide or monitor treatment. Performed at [facility], [place] [address], [place], [place] [address]      Radiology Studies: No results found.   Scheduled Meds:  cefdinir  300 mg Oral  Q12H   gentamicin (GARAMYCIN) 80 mg in sodium chloride 0.9 % 500 mL irrigation  80 mg Irrigation On Call   levothyroxine  225 mcg Oral Q0600   pravastatin  40 mg Oral q1800   Continuous Infusions:  sodium chloride 50 mL/hr at 05/27/22 0849   sodium chloride 50 mL/hr at 05/27/22 0846   vancomycin        LOS: 4 days    Time spent: 3mn  PDomenic Polite MD [facility]   05/27/2022, 11:54 AM

## 2022-05-27 NOTE — H&P (View-Only) (Signed)
Electrophysiology Rounding Note  Patient Name: [name] Date of Encounter: 05/27/2022  Primary Cardiologist: [name], MD Electrophysiologist: New   Subjective   Had increasing runs of symptomatic CHB last evening and overnight, with lightheadedness.   This am doing OK awake with daughter and RNs in room.   Inpatient Medications    Scheduled Meds:  cefdinir  300 mg Oral Q12H   gentamicin (GARAMYCIN) 80 mg in sodium chloride 0.9 % 500 mL irrigation  80 mg Irrigation On Call   levothyroxine  225 mcg Oral Q0600   pravastatin  40 mg Oral q1800   Continuous Infusions:  sodium chloride     sodium chloride     vancomycin     PRN Meds: acetaminophen, camphor-menthol, guaiFENesin-dextromethorphan, ondansetron (ZOFRAN) IV   Vital Signs    Vitals:   05/27/22 0133 05/27/22 0506 05/27/22 0524 05/27/22 0525  BP:    (!) 133/56  Pulse:    (!) 57  Resp:    19  Temp:   97.7 F (36.5 C)   TempSrc:   Oral   SpO2:    96%  Weight: 92.7 kg 95.7 kg    Height:        Intake/Output Summary (Last 24 hours) at 05/27/2022 0721 Last data filed at 05/27/2022 0136 Gross per 24 hour  Intake 360 ml  Output 2300 ml  Net -1940 ml   Filed Weights   05/26/22 0414 05/27/22 0133 05/27/22 0506  Weight: 92.7 kg 92.7 kg 95.7 kg    Physical Exam    GEN- The patient is well appearing, alert and oriented x 3 today.   Head- normocephalic, atraumatic Eyes-  Sclera clear, conjunctiva pink Ears- hearing intact Oropharynx- clear Neck- supple Lungs- Clear to ausculation bilaterally, normal work of breathing Heart-  Irregular  rate and rhythm, no murmurs, rubs or gallops GI- soft, NT, ND, + BS Extremities- no clubbing or cyanosis. No edema Skin- no rash or lesion Psych- euthymic mood, full affect Neuro- strength and sensation are intact  Labs    CBC Recent Labs    05/25/22 0450  WBC 7.5  HGB 11.0*  HCT 33.8*  MCV 84.5  PLT 387   Basic Metabolic Panel Recent Labs     05/26/22 0747 05/27/22 0430  NA 135 134*  K 3.9 4.2  CL 103 107  CO2 23 23  GLUCOSE 112* 90  BUN 20 19  CREATININE 1.33* 1.26*  CALCIUM 8.7* 8.4*   Liver Function Tests No results for input(s): "AST", "ALT", "ALKPHOS", "BILITOT", "PROT", "ALBUMIN" in the last 72 hours. No results for input(s): "LIPASE", "AMYLASE" in the last 72 hours. Cardiac Enzymes No results for input(s): "CKTOTAL", "CKMB", "CKMBINDEX", "TROPONINI" in the last 72 hours.   Telemetry    Intermittent junctional rhythm in 20-30s, intermittent sinus brady/NSR 40-60s (personally reviewed)  Radiology    No results found.  Patient Profile     [name] is a 88 y.o. female with a history of persistent atrial fibrillation, chronic combined CHF, RBBB, HTN, HLD, and hypothyroidism who is being seen today for the evaluation of tachy-brady syndrome at the request of Dr. Debara Pickett.  Assessment & Plan    1.  Tachy-brady syndrome 2. AF with RVR 3. Junctional rhythm  Underwent [facility] 8/17 with bradycardia afterwards. Amio and metoprolol held. Continues to have intermittent CHB and junctional rhythm.  Explained risks, benefits, and alternatives to PPM implantation, including but not limited to bleeding, infection, pneumothorax, pericardial effusion, lead dislodgement, heart  attack, stroke, or death.  Pt verbalized understanding and agrees to proceed.  Increased risk of bleeding with recent [facility] and need to continue [place] with minimal interruption. Plan is to hold today for pacing, and reassess appropriate dose with renal function tomorrow.    4. UTI Per primary team UCx + klebsiella pneumoniae No signs of systemic infection  For questions or updates, please contact [facility] Please consult [email or web address] for contact info under Cardiology/STEMI.  Signed, Shirley Friar, PA-C  05/27/2022, 7:21 AM   I have seen and examined this patient with Oda Kilts.  Agree with above, note added to reflect my  findings.  Had symptomatic heart block overnight last night with lightheadedness.  Feels well this morning, though sleepy as she did not get much sleep.  Ready for pacemaker implant.  GEN: Well nourished, well developed, in no acute distress  HEENT: normal  Neck: no JVD, carotid bruits, or masses Cardiac: RRR; no murmurs, rubs, or gallops,no edema  Respiratory:  clear to auscultation bilaterally, normal work of breathing GI: soft, nontender, nondistended, + BS MS: no deformity or atrophy  Skin: warm and dry Neuro:  Strength and sensation are intact Psych: euthymic mood, full affect   Tachybradycardia syndrome: Has significant bradycardia as well as rapid atrial fibrillation.  Due to this, would plan for pacemaker implant.  Risk and benefits were discussed which include bleeding, tamponade, infection, pneumothorax.  She understands the risks and is agreed to the procedure.   Atrial fibrillation: Had a recent cardioversion 05/22/2022.  Holding dose of Eliquis this morning.  If no bleeding, Linkyn Gobin give Eliquis tonight. UTI: Currently on antibiotics per primary team.  No signs of systemic infection.  Auren Valdes M. Costantino Kohlbeck MD 05/27/2022 10:11 AM

## 2022-05-27 NOTE — Progress Notes (Signed)
Cross Cover Note   Notified by nursing of HR from 29-30 with CHB with normal Bps. Patient is pending PPM today. Asymptomatic. No interventions made given definite management soon.   Lorenda Cahill, MD

## 2022-05-27 NOTE — Progress Notes (Signed)
Mobility Specialist Progress Note:   05/27/22 1100  Mobility  Activity Contraindicated/medical hold   RN asking to hold mobility d/t low HR. Will follow-up as time allows.  [facility] Jayde Daffin Mobility Specialist

## 2022-05-27 NOTE — Progress Notes (Addendum)
Electrophysiology Rounding Note  Patient Name: [name] Date of Encounter: 05/27/2022  Primary Cardiologist: [name], MD Electrophysiologist: New   Subjective   Had increasing runs of symptomatic CHB last evening and overnight, with lightheadedness.   This am doing OK awake with daughter and RNs in room.   Inpatient Medications    Scheduled Meds:  cefdinir  300 mg Oral Q12H   gentamicin (GARAMYCIN) 80 mg in sodium chloride 0.9 % 500 mL irrigation  80 mg Irrigation On Call   levothyroxine  225 mcg Oral Q0600   pravastatin  40 mg Oral q1800   Continuous Infusions:  sodium chloride     sodium chloride     vancomycin     PRN Meds: acetaminophen, camphor-menthol, guaiFENesin-dextromethorphan, ondansetron (ZOFRAN) IV   Vital Signs    Vitals:   05/27/22 0133 05/27/22 0506 05/27/22 0524 05/27/22 0525  BP:    (!) 133/56  Pulse:    (!) 57  Resp:    19  Temp:   97.7 F (36.5 C)   TempSrc:   Oral   SpO2:    96%  Weight: 92.7 kg 95.7 kg    Height:        Intake/Output Summary (Last 24 hours) at 05/27/2022 0721 Last data filed at 05/27/2022 0136 Gross per 24 hour  Intake 360 ml  Output 2300 ml  Net -1940 ml   Filed Weights   05/26/22 0414 05/27/22 0133 05/27/22 0506  Weight: 92.7 kg 92.7 kg 95.7 kg    Physical Exam    GEN- The patient is well appearing, alert and oriented x 3 today.   Head- normocephalic, atraumatic Eyes-  Sclera clear, conjunctiva pink Ears- hearing intact Oropharynx- clear Neck- supple Lungs- Clear to ausculation bilaterally, normal work of breathing Heart-  Irregular  rate and rhythm, no murmurs, rubs or gallops GI- soft, NT, ND, + BS Extremities- no clubbing or cyanosis. No edema Skin- no rash or lesion Psych- euthymic mood, full affect Neuro- strength and sensation are intact  Labs    CBC Recent Labs    05/25/22 0450  WBC 7.5  HGB 11.0*  HCT 33.8*  MCV 84.5  PLT 161   Basic Metabolic Panel Recent Labs     05/26/22 0747 05/27/22 0430  NA 135 134*  K 3.9 4.2  CL 103 107  CO2 23 23  GLUCOSE 112* 90  BUN 20 19  CREATININE 1.33* 1.26*  CALCIUM 8.7* 8.4*   Liver Function Tests No results for input(s): "AST", "ALT", "ALKPHOS", "BILITOT", "PROT", "ALBUMIN" in the last 72 hours. No results for input(s): "LIPASE", "AMYLASE" in the last 72 hours. Cardiac Enzymes No results for input(s): "CKTOTAL", "CKMB", "CKMBINDEX", "TROPONINI" in the last 72 hours.   Telemetry    Intermittent junctional rhythm in 20-30s, intermittent sinus brady/NSR 40-60s (personally reviewed)  Radiology    No results found.  Patient Profile     [name] is a 88 y.o. female with a history of persistent atrial fibrillation, chronic combined CHF, RBBB, HTN, HLD, and hypothyroidism who is being seen today for the evaluation of tachy-brady syndrome at the request of Dr. Debara Pickett.  Assessment & Plan    1.  Tachy-brady syndrome 2. AF with RVR 3. Junctional rhythm  Underwent [facility] 8/17 with bradycardia afterwards. Amio and metoprolol held. Continues to have intermittent CHB and junctional rhythm.  Explained risks, benefits, and alternatives to PPM implantation, including but not limited to bleeding, infection, pneumothorax, pericardial effusion, lead dislodgement, heart  attack, stroke, or death.  Pt verbalized understanding and agrees to proceed.  Increased risk of bleeding with recent [facility] and need to continue [place] with minimal interruption. Plan is to hold today for pacing, and reassess appropriate dose with renal function tomorrow.    4. UTI Per primary team UCx + klebsiella pneumoniae No signs of systemic infection  For questions or updates, please contact [facility] Please consult [email or web address] for contact info under Cardiology/STEMI.  Signed, Shirley Friar, PA-C  05/27/2022, 7:21 AM   I have seen and examined this patient with Oda Kilts.  Agree with above, note added to reflect my  findings.  Had symptomatic heart block overnight last night with lightheadedness.  Feels well this morning, though sleepy as she did not get much sleep.  Ready for pacemaker implant.  GEN: Well nourished, well developed, in no acute distress  HEENT: normal  Neck: no JVD, carotid bruits, or masses Cardiac: RRR; no murmurs, rubs, or gallops,no edema  Respiratory:  clear to auscultation bilaterally, normal work of breathing GI: soft, nontender, nondistended, + BS MS: no deformity or atrophy  Skin: warm and dry Neuro:  Strength and sensation are intact Psych: euthymic mood, full affect   Tachybradycardia syndrome: Has significant bradycardia as well as rapid atrial fibrillation.  Due to this, would plan for pacemaker implant.  Risk and benefits were discussed which include bleeding, tamponade, infection, pneumothorax.  She understands the risks and is agreed to the procedure.   Atrial fibrillation: Had a recent cardioversion 05/22/2022.  Holding dose of Eliquis this morning.  If no bleeding, [name] give Eliquis tonight. UTI: Currently on antibiotics per primary team.  No signs of systemic infection.  [name] M. Barbara Bassin MD 05/27/2022 10:11 AM

## 2022-05-28 ENCOUNTER — Inpatient Hospital Stay (HOSPITAL_COMMUNITY): Payer: Medicare HMO

## 2022-05-28 ENCOUNTER — Encounter (HOSPITAL_COMMUNITY): Payer: Self-pay | Admitting: Cardiology

## 2022-05-28 ENCOUNTER — Other Ambulatory Visit (HOSPITAL_COMMUNITY): Payer: Self-pay

## 2022-05-28 DIAGNOSIS — N179 Acute kidney failure, unspecified: Secondary | ICD-10-CM | POA: Diagnosis not present

## 2022-05-28 DIAGNOSIS — J90 Pleural effusion, not elsewhere classified: Secondary | ICD-10-CM | POA: Diagnosis not present

## 2022-05-28 DIAGNOSIS — N189 Chronic kidney disease, unspecified: Secondary | ICD-10-CM | POA: Diagnosis not present

## 2022-05-28 DIAGNOSIS — J9811 Atelectasis: Secondary | ICD-10-CM | POA: Diagnosis not present

## 2022-05-28 DIAGNOSIS — N390 Urinary tract infection, site not specified: Secondary | ICD-10-CM | POA: Diagnosis present

## 2022-05-28 DIAGNOSIS — I5023 Acute on chronic systolic (congestive) heart failure: Secondary | ICD-10-CM

## 2022-05-28 DIAGNOSIS — I495 Sick sinus syndrome: Secondary | ICD-10-CM | POA: Diagnosis present

## 2022-05-28 DIAGNOSIS — N3000 Acute cystitis without hematuria: Secondary | ICD-10-CM | POA: Diagnosis not present

## 2022-05-28 DIAGNOSIS — E039 Hypothyroidism, unspecified: Secondary | ICD-10-CM | POA: Diagnosis not present

## 2022-05-28 LAB — CBC
HCT: 32.2 % — ABNORMAL LOW (ref 36.0–46.0)
Hemoglobin: 10.4 g/dL — ABNORMAL LOW (ref 12.0–15.0)
MCH: 27.4 pg (ref 26.0–34.0)
MCHC: 32.3 g/dL (ref 30.0–36.0)
MCV: 84.7 fL (ref 80.0–100.0)
Platelets: 192 10*3/uL (ref 150–400)
RBC: 3.8 MIL/uL — ABNORMAL LOW (ref 3.87–5.11)
RDW: 14.5 % (ref 11.5–15.5)
WBC: 4.6 10*3/uL (ref 4.0–10.5)
nRBC: 0 % (ref 0.0–0.2)

## 2022-05-28 LAB — BASIC METABOLIC PANEL
Anion gap: 7 (ref 5–15)
BUN: 15 mg/dL (ref 8–23)
CO2: 23 mmol/L (ref 22–32)
Calcium: 8.8 mg/dL — ABNORMAL LOW (ref 8.9–10.3)
Chloride: 106 mmol/L (ref 98–111)
Creatinine, Ser: 1.06 mg/dL — ABNORMAL HIGH (ref 0.44–1.00)
GFR, Estimated: 51 mL/min — ABNORMAL LOW (ref >60)
Glucose, Bld: 77 mg/dL (ref 70–99)
Potassium: 4.3 mmol/L (ref 3.5–5.1)
Sodium: 136 mmol/L (ref 135–145)

## 2022-05-28 MED ORDER — AMIODARONE HCL 200 MG PO TABS
200.0000 mg | ORAL_TABLET | Freq: Every day | ORAL | Status: DC
  Administered 2022-05-28: 200 mg via ORAL
  Filled 2022-05-28: qty 1

## 2022-05-28 MED ORDER — LEVOTHYROXINE SODIUM 75 MCG PO TABS
225.0000 ug | ORAL_TABLET | Freq: Every day | ORAL | 0 refills | Status: DC
Start: 2022-05-29 — End: 2022-11-03
  Filled 2022-05-28: qty 90, 30d supply, fill #0

## 2022-05-28 MED ORDER — METOPROLOL TARTRATE 12.5 MG HALF TABLET
12.5000 mg | ORAL_TABLET | Freq: Every day | ORAL | Status: DC
Start: 2022-05-28 — End: 2022-05-28

## 2022-05-28 MED ORDER — METOPROLOL SUCCINATE ER 25 MG PO TB24
12.5000 mg | ORAL_TABLET | Freq: Every day | ORAL | Status: DC
  Administered 2022-05-28: 12.5 mg via ORAL
  Filled 2022-05-28: qty 1

## 2022-05-28 MED ORDER — APIXABAN 5 MG PO TABS
5.0000 mg | ORAL_TABLET | Freq: Two times a day (BID) | ORAL | Status: DC
  Administered 2022-05-28: 5 mg via ORAL
  Filled 2022-05-28: qty 1

## 2022-05-28 MED ORDER — METOPROLOL SUCCINATE ER 25 MG PO TB24
12.5000 mg | ORAL_TABLET | Freq: Every day | ORAL | 0 refills | Status: DC
  Filled 2022-05-28: qty 15, 30d supply, fill #0

## 2022-05-28 MED ORDER — FUROSEMIDE 10 MG/ML IJ SOLN
40.0000 mg | Freq: Once | INTRAMUSCULAR | Status: AC
  Administered 2022-05-28: 40 mg via INTRAVENOUS
  Filled 2022-05-28: qty 4

## 2022-05-28 MED ORDER — LEVOTHYROXINE SODIUM 75 MCG PO TABS: 225.0000 ug | ORAL_TABLET | Freq: Every day | ORAL | Status: DC

## 2022-05-28 MED ORDER — SACUBITRIL-VALSARTAN 24-26 MG PO TABS
1.0000 | ORAL_TABLET | Freq: Two times a day (BID) | ORAL | Status: DC
  Administered 2022-05-28: 1 via ORAL
  Filled 2022-05-28: qty 1

## 2022-05-28 MED ORDER — PRAVASTATIN SODIUM 40 MG PO TABS
40.0000 mg | ORAL_TABLET | Freq: Every day | ORAL | 0 refills | Status: DC
  Filled 2022-05-28: qty 30, 30d supply, fill #0

## 2022-05-28 MED ORDER — CEFDINIR 300 MG PO CAPS
300.0000 mg | ORAL_CAPSULE | Freq: Two times a day (BID) | ORAL | 0 refills | Status: AC
  Filled 2022-05-28: qty 4, 2d supply, fill #0

## 2022-05-28 MED ORDER — POTASSIUM CHLORIDE CRYS ER 20 MEQ PO TBCR
20.0000 meq | EXTENDED_RELEASE_TABLET | Freq: Once | ORAL | Status: AC
  Administered 2022-05-28: 20 meq via ORAL
  Filled 2022-05-28: qty 1

## 2022-05-28 MED ORDER — METOPROLOL TARTRATE 12.5 MG HALF TABLET: 12.5000 mg | ORAL_TABLET | Freq: Every day | ORAL | Status: DC

## 2022-05-28 NOTE — Assessment & Plan Note (Addendum)
Acute metabolic encephalopathy with delirium.   Patient was placed on IV ceftriaxone for antibiotic therapy. Cultures have remained negative. Plan to continue with po cefdinir for 2 more days to complete 5 days.

## 2022-05-28 NOTE — Progress Notes (Signed)
Occupational Therapy Treatment Patient Details Name: [name] MRN: [id number] DOB: 1931/07/01 Today's Date: 05/28/2022   History of present illness 88 y/o female presented to ED on 05/22/22 with bradycardia (HR 30s) and hypotension. Recent cardioversion early on 8/17. Had another episode of brady on 05/26/22.  PMH: CHF, Afib, mild memory loss, HTN   OT comments  Pt greeted supine in bed reporting having just worked with mobility team and just gotten cleaned up from pure wick leaking. Pt declined OOB mobility but agreeable to educational session regarding ICD precautions and compensatory methods for maintaining precautions during ADLs. Pt able to verbalize ICD precautions but reinforced with handout. Reviewed compensatory methods as indicated below. Daughter present during session also verbalizing understanding of precautions. Pt would continue to benefit from skilled occupational therapy while admitted and after d/c to address the below listed limitations in order to improve overall functional mobility and facilitate independence with BADL participation. DC plan remains appropriate, will follow acutely per POC.     Recommendations for follow up therapy are one component of a multi-disciplinary discharge planning process, led by the attending physician.  Recommendations may be updated based on patient status, additional functional criteria and insurance authorization.    Follow Up Recommendations  Home health OT    Assistance Recommended at Discharge Frequent or constant Supervision/Assistance  Patient can return home with the following  A little help with walking and/or transfers;A little help with bathing/dressing/bathroom;Assistance with cooking/housework;Help with stairs or ramp for entrance;Assist for transportation;Direct supervision/assist for financial management;Direct supervision/assist for medications management   Equipment Recommendations  None recommended by OT     Recommendations for Other Services      Precautions / Restrictions Precautions Precautions: Fall Precaution Comments: watch HR and O2, ICD precautions Restrictions Weight Bearing Restrictions: No       Mobility Bed Mobility               General bed mobility comments: pt declined OOb mobility as she had just gotten back in bed but reviewed technique for OOB mobiltiy at home with recommendation on exiting bed to R side to avoid having to apply + pressure to LUE for pain mgmt. however pt did report she plans to sleep in her lift recliner initially    Transfers                         Balance                                           ADL either performed or assessed with clinical judgement   ADL                                         General ADL Comments: session focus on education related to maintaining ICD precautions during ADLs, education provided on compensatory methods for bathing/dressign d/t precautiosn with handout provided to reinforce precautions. reviewed ADL routine with pt reporting she has shower seat but plans to wash her hair in the sink to maintain integrity of ICD dressing. daughter present throughotu session verbalizing understanding of all education as she will be living with the pt to assist with carryover of precautions    Extremity/Trunk Assessment Upper Extremity Assessment Upper Extremity Assessment: Generalized weakness  Lower Extremity Assessment Lower Extremity Assessment: Generalized weakness        Vision Baseline Vision/History: 1 Wears glasses Ability to See in Adequate Light: 0 Adequate Patient Visual Report: No change from baseline     Perception Perception Perception: Within Functional Limits   Praxis Praxis Praxis: Intact    Cognition Arousal/Alertness: Awake/alert Behavior During Therapy: WFL for tasks assessed/performed Overall Cognitive Status: History of cognitive  impairments - at baseline                                          Exercises      Shoulder Instructions       General Comments HR maintaining in 60s during session    Pertinent Vitals/ Pain       Pain Assessment Pain Assessment: Faces Faces Pain Scale: Hurts a little bit Pain Location: LUE Pain Descriptors / Indicators: Discomfort, Grimacing, Guarding Pain Intervention(s): Limited activity within patient's tolerance, Monitored during session, RN gave pain meds during session  Home Living                                          Prior Functioning/Environment              Frequency  Min 2X/week        Progress Toward Goals  OT Goals(current goals can now be found in the care plan section)  Progress towards OT goals: Progressing toward goals  Acute Rehab OT Goals Patient Stated Goal: to go home OT Goal Formulation: With patient/family Time For Goal Achievement: 06/08/22 Potential to Achieve Goals: Good  Plan Discharge plan remains appropriate;Frequency remains appropriate    Co-evaluation                 AM-PAC OT "6 Clicks" Daily Activity     Outcome Measure   Help from another person eating meals?: None Help from another person taking care of personal grooming?: A Little Help from another person toileting, which includes using toliet, bedpan, or urinal?: A Little Help from another person bathing (including washing, rinsing, drying)?: A Little Help from another person to put on and taking off regular upper body clothing?: A Little Help from another person to put on and taking off regular lower body clothing?: A Little 6 Click Score: 19    End of Session    OT Visit Diagnosis: Unsteadiness on feet (R26.81);Other abnormalities of gait and mobility (R26.89)   Activity Tolerance Patient tolerated treatment well   Patient Left in bed;with call bell/phone within reach;with bed alarm set;with nursing/sitter in  room;with family/visitor present   Nurse Communication Mobility status;Other (comment) (Rn present)        Time: 6270-3500 OT Time Calculation (min): 21 min  Charges: OT General Charges $OT Visit: 1 Visit OT Treatments $Self Care/Home Management : 8-22 mins  [name]., COTA/L Acute Rehabilitation Services [phone]   [name] 05/28/2022, 1:20 PM

## 2022-05-28 NOTE — TOC Transition Note (Signed)
Transition of Care [facility]) - CM/SW Discharge Note   Patient Details  Name: [name] MRN: [id number] Date of Birth: September 16, 1931  Transition of Care [facility]) CM/SW Contact:  Zenon Mayo, RN Phone Number: 05/28/2022, 1:03 PM   Clinical Narrative:    Patient is for dc today, NCM notifoed Claiborne Billings with [place].  Also follow up apt with PCP is on AVS.           Patient Goals and CMS Choice        Discharge Placement                       Discharge Plan and Services                                     Social Determinants of Health (SDOH) Interventions     Readmission Risk Interventions     No data to display

## 2022-05-28 NOTE — Progress Notes (Addendum)
Electrophysiology Rounding Note  Patient Name: [name] Date of Encounter: 05/28/2022  Primary Cardiologist: [name], MD Electrophysiologist: Dr. Curt Bears   Subjective   Slightly sore.  Somewhat short of breath this am  Inpatient Medications    Scheduled Meds:  apixaban  5 mg Oral BID   cefdinir  300 mg Oral Q12H   furosemide  40 mg Intravenous Once   levothyroxine  225 mcg Oral Q0600   potassium chloride  20 mEq Oral Once   pravastatin  40 mg Oral q1800   Continuous Infusions:  PRN Meds: acetaminophen, camphor-menthol, guaiFENesin-dextromethorphan, ondansetron (ZOFRAN) IV   Vital Signs    Vitals:   05/27/22 1752 05/27/22 1932 05/28/22 0014 05/28/22 0437  BP:  (!) 134/52 111/62 132/62  Pulse:  63 (!) 57 (!) 58  Resp:  '20 20 20  '$ Temp: 97.6 F (36.4 C) (!) 97.3 F (36.3 C) 98 F (36.7 C) 97.6 F (36.4 C)  TempSrc: Oral Oral Oral Oral  SpO2:  98% 96% 97%  Weight:   93.9 kg   Height:        Intake/Output Summary (Last 24 hours) at 05/28/2022 0758 Last data filed at 05/28/2022 0441 Gross per 24 hour  Intake 900.04 ml  Output 1250 ml  Net -349.96 ml   Filed Weights   05/27/22 0133 05/27/22 0506 05/28/22 0014  Weight: 92.7 kg 95.7 kg 93.9 kg    Physical Exam    GEN- The patient is well appearing, alert and oriented x 3 today.   Head- normocephalic, atraumatic Eyes-  Sclera clear, conjunctiva pink Ears- hearing intact Oropharynx- clear Neck- supple Lungs- Clear to ausculation bilaterally, normal work of breathing Heart- Regular rate and rhythm, no murmurs, rubs or gallops GI- soft, NT, ND, + BS Extremities- no clubbing or cyanosis. No edema Skin- no rash or lesion Psych- euthymic mood, full affect Neuro- strength and sensation are intact  Labs    CBC No results for input(s): "WBC", "NEUTROABS", "HGB", "HCT", "MCV", "PLT" in the last 72 hours. Basic Metabolic Panel Recent Labs    05/26/22 0747 05/27/22 0430  NA 135 134*  K  3.9 4.2  CL 103 107  CO2 23 23  GLUCOSE 112* 90  BUN 20 19  CREATININE 1.33* 1.26*  CALCIUM 8.7* 8.4*   Liver Function Tests No results for input(s): "AST", "ALT", "ALKPHOS", "BILITOT", "PROT", "ALBUMIN" in the last 72 hours. No results for input(s): "LIPASE", "AMYLASE" in the last 72 hours. Cardiac Enzymes No results for input(s): "CKTOTAL", "CKMB", "CKMBINDEX", "TROPONINI" in the last 72 hours.   Telemetry    AP-VS 60 (personally reviewed)  Radiology    DG Chest 2 View  Result Date: 05/28/2022 CLINICAL DATA:  [id number], cardiac device in-situ, other. EXAM: CHEST - 2 VIEW COMPARISON:  Portable chest 05/22/2022. FINDINGS: 5:02 a.m. Interval insertion of a left chest dual lead pacing system. There is no pneumothorax postprocedure. One of the wires terminates in the right atrium and the other appears to terminate in the right ventricle. Stable appearance basilar atelectasis and small layering pleural effusions. No focal pneumonia is seen. Cardiomegaly. The central vessels are normal caliber. There is mild aortic tortuosity with stable mediastinum. There is osteopenia with chronic mild thoracic levoscoliosis and degenerative change. IMPRESSION: 1. Left chest dual lead pacemaker and wire insertions as described above. 2. Small pleural effusions and persistent bibasilar atelectasis. 3. Stable cardiomegaly without evidence of CHF. Electronically Signed   By: Telford Nab M.D.   On: 05/28/2022 07:05  EP PPM/ICD IMPLANT  Result Date: 05/27/2022 SURGEON:  Allegra Lai, MD   PREPROCEDURE DIAGNOSIS:  tachy/brady syndrome   POSTPROCEDURE DIAGNOSIS:  tachy/brady syndrome    PROCEDURES:  1. Pacemaker implantation.  2. Left upper extremity venography.   INTRODUCTION:  [name] is a 88 y.o. female with a history of bradycardia who presents today for pacemaker implantation.  The patient reports intermittent episodes of dizziness over the past few months.  No reversible causes have been identified.   The patient therefore presents today for pacemaker implantation.   DESCRIPTION OF PROCEDURE:  Informed written consent was obtained, and  the patient was brought to the electrophysiology lab in a fasting state.  The patient required no sedation for the procedure today.  The patients left chest was prepped and draped in the usual sterile fashion by the EP lab staff. The skin overlying the left deltopectoral region was infiltrated with lidocaine for local analgesia.  A 4-cm incision was made over the left deltopectoral region.  A left subcutaneous pacemaker pocket was fashioned using a combination of sharp and blunt dissection. Electrocautery was required to assure hemostasis.  Left Upper Extremity Venography: A venogram of the left upper extremity was performed, which revealed a large left cephalic vein, which emptied into a large left subclavian vein.  The left axillary vein was moderate in size.  RA/RV Lead Placement: The left axillary vein was therefore cannulated.  Through the left axillary vein, a Medtronic CapSureFix Novus [id number]  (serial number  [id number]) right atrial lead and an Medtronic SelectSure [id number] (serial number  [id number]) right ventricular lead were advanced with fluoroscopic visualization into the right atrial appendage and right ventricular apex positions respectively.  Initial atrial lead P- waves measured 1.4 mV with impedance of 573 ohms and a threshold of 1.1 V at 0.5 msec.  Right ventricular lead R-waves measured 7.2 mV with an impedance of 882 ohms and a threshold of 0.5 V at 0.5 msec.  Both leads were secured to the pectoralis fascia using #2-0 silk over the suture sleeves. Device Placement:  The leads were then connected to an Medtronic Azure XT DR [id number]   (serial number  [id number] ) pacemaker.  The pocket was irrigated with copious gentamicin solution.  The pacemaker was then placed into the pocket.  The pocket was then closed in 3 layers with 2.0 Vicryl suture for the 3.0 Vicryl  suture subcutaneous and subcuticular layers.  Steri-  Strips and a sterile dressing were then applied. EBL<45m.  There were no early apparent complications.   CONCLUSIONS:  1. Successful implantation of a [employer] Jude Medical Assurity MRI dual-chamber pacemaker for symptomatic bradycardia  2. No early apparent complications.       Lesieli Bresee CCurt Bears MD 05/27/2022 5:26 PM   Patient Profile     EKRISTALYN BERGSTRESSERis a 88y.o. female with a history of persistent atrial fibrillation, chronic combined CHF, RBBB, HTN, HLD, and hypothyroidism who is being seen today for the evaluation of tachy-brady syndrome at the request of Dr. HDebara Pickett  Assessment & Plan    1.  Tachy-brady syndrome 2. AF with RVR 3. Junctional rhythm  S/p DDD MDT PPM 05/27/2022 by Dr. CCurt BearsCXR looks good.  Site looks stable. Pressure dressing removed. With recent cardioversion, Dillon Mcreynolds resume eliquis this am at appropriate dose of 5 mg BID given Weight > 60 kg and Cr <1.5 at baseline.  Croix Presley review teaching once patients daughter arrives.   4. UTI Per primary team UCx + klebsiella  pneumoniae No signs of systemic infection  5. Acute diastolic CHF SOB this am after IVF yesterday. Give one time lasix this am.   For questions or updates, please contact [place] Please consult [email or web address] for contact info under Cardiology/STEMI.  Signed, Shirley Friar, PA-C  05/28/2022, 7:58 AM   I have seen and examined this patient with Oda Kilts.  Agree with above, note added to reflect my findings.  His main complaint yesterday.  Patient currently feeling well with mild shortness of breath  GEN: Well nourished, well developed, in no acute distress  HEENT: normal  Neck: no JVD, carotid bruits, or masses Cardiac: RRR; no murmurs, rubs, or gallops,no edema  Respiratory:  clear to auscultation bilaterally, normal work of breathing GI: soft, nontender, nondistended, + BS MS: no deformity or atrophy  Skin: warm and dry, device site  well healed Neuro:  Strength and sensation are intact Psych: euthymic mood, full affect   Sick sinus syndrome: Has had rapid atrial fibrillation.  Post Medtronic dual-chamber pacemaker.  Device functioning appropriately.  At this point okay for discharge home. Persistent atrial fibrillation: Remains in sinus rhythm.  Would continue anticoagulation.  Would not hold anticoagulation with recent cardioversion. Shortness of breath: Unclear as to the cause of her shortness of breath, though she does have pleural effusions.  We Margarie Mcguirt give her 1 dose of Lasix.  Otherwise plan per primary team.  Electrophysiology to sign off.  We Nehemiah Mcfarren arrange for follow-up in clinic.  Ednah Hammock M. Cortez Steelman MD 05/28/2022 10:43 AM

## 2022-05-28 NOTE — Assessment & Plan Note (Addendum)
Patient underwent pacemaker implantation with no complications.  Plan to continue anticoagulation with apixaban  Paroxysmal atrial fibrillation, continue with amiodarone and reduced dose of metoprolol.

## 2022-05-28 NOTE — Discharge Summary (Addendum)
Physician Discharge Summary   Patient: [name] MRN: [id number] DOB: 15-Mar-1931  Admit date:     05/22/2022  Discharge date: 05/28/22  Discharge Physician: Jimmy Picket Delaynie Stetzer   PCP: Merrilee Seashore, MD   Recommendations at discharge:    Plan to continue anticoagulation with apixaban Continue amiodarone 200 mg daily Metoprolol XL has been decreased to 12.5 mg daily Continue diuresis with furosemide  High TSH and low free t3, increased dose of levothyroxine, follow thyroid function in 3 to 4 weeks.   Discharge Diagnoses: Principal Problem:   Bradycardia Active Problems:   Hypothyroidism   PAF (paroxysmal atrial fibrillation) (HCC)  Resolved Problems:   * No resolved hospital problems. [facility] Course: Mrs. Ken was admitted to the hospital with the working diagnosis of symptomatic bradycardia.   88 yo female with the past medical history of heart failure, atrial fibrillation, and hypothyroidism who presented with dizziness and weakness. She was noted to have a low pulse and low blood pressure, EMS was called and patient was transported to the ED. Her heart rate was in the 30's and she received 2 doses of atropine on route. In the ED she received further atropine, her HR 69, blood pressure 134/57, RR 22 and 02 saturation 88%, lungs with no rales or wheezing, heart with S1 and S2 present and bradycardic, abdomen not distended and no lower extremity edema.   Na 139, K 4,2 CL 108, bicarbonate 21 glucose 99, bun 25 cr 1,68  BNP 335  High sensitive troponin 6  Wbc 6,8 hgb 12,4 plt 208  Sars covid 19 negative.   Urine analysis SG 1,015, >50 wbc, 21-50 brc, 100 protein.  Urine culture positive for Klebsiella Pneumonia \  Chest radiograph with right rotation, mild fluid in the right fissure, no other infiltrates.   EKG 42 bpm, normal axis, 1st degree AV block, right bundle branch block, sinus rhythm with sinus block, no significant ST segment or T wave changes.    Patient had persistent bradycardia, and decision was made to proceed with pace maker implantation. She did received antibiotic therapy with ceftriaxone.   Assessment and Plan: Sick sinus syndrome [facility]) Patient underwent pacemaker implantation with no complications.  Plan to continue anticoagulation with apixaban  Paroxysmal atrial fibrillation, continue with amiodarone and reduced dose of metoprolol.   UTI (urinary tract infection) Acute metabolic encephalopathy with delirium.   Patient was placed on IV ceftriaxone for antibiotic therapy. Cultures have remained negative. Plan to continue with po cefdinir for 2 more days to complete 5 days.   Acute on chronic systolic CHF (congestive heart failure) (HCC) Echocardiogram with mild reduction in LV systolic function with ED 45 to 50%, with global hypokinesis, RV systolic function preserved, moderate MR. Mild to moderate TR.   Patient was placed on furosemide for diuresis.   Plan to continue furosemide as outpatient, continue with metoprolol.  Continue with Entresto,   Acute kidney injury superimposed on chronic kidney disease ([place]) CKD stage 3a.  Patient with recovering renal function, at the time of her discharge her serum cr is down to 1,0 with at 4,3 and serum bicarbonate 23.   Plan to follow up renal function as outpatient.   Hypothyroidism Continue with levothyroxine at a hight dose of 225 mcg daily Her TSH is very elevated and her free T 3 is low.          Consultants: cardiology  Procedures performed: pacemaker implantation   Disposition: Home Diet recommendation:  Cardiac diet DISCHARGE  MEDICATION: Allergies as of 05/28/2022       Reactions   Fluoxetine    Other reaction(s): hyponatremia   Keflex [cephalexin] Other (See Comments)   Daughter reports severe reaction specifically with keflex, Neither can remember what it was currently. Tolerated Cefdinir 05/2022   Serotonin Reuptake Inhibitors (ssris)     Other reaction(s): hyponatremia        Medication List     TAKE these medications    acetaminophen 500 MG tablet Commonly known as: TYLENOL Take 1,000 mg by mouth every 6 (six) hours as needed for mild pain.   albuterol 108 (90 Base) MCG/ACT inhaler Commonly known as: VENTOLIN HFA Inhale 1 puff into the lungs every 6 (six) hours as needed for wheezing or shortness of breath.   amiodarone 200 MG tablet Commonly known as: PACERONE Take 1 tablet (200 mg total) by mouth daily.   apixaban 5 MG Tabs tablet Commonly known as: ELIQUIS Take 1 tablet (5 mg total) by mouth 2 (two) times daily.   benzonatate 100 MG capsule Commonly known as: Tessalon Perles Take 1 capsule (100 mg total) by mouth 3 (three) times daily as needed for cough.   carboxymethylcellulose 0.5 % Soln Commonly known as: REFRESH PLUS Place 1 drop into both eyes daily as needed (dry eyes).   cefdinir 300 MG capsule Commonly known as: OMNICEF Take 1 capsule (300 mg total) by mouth every 12 (twelve) hours for 2 days.   cyanocobalamin 1000 MCG tablet Commonly known as: VITAMIN B12 Take 2,000 mcg by mouth daily.   furosemide 20 MG tablet Commonly known as: LASIX Take '40mg'$  (2 tablets) daily. May take additional '20mg'$  (1 tablet) as needed for weight gain of 2 pounds overnight or 5 pounds in one week   hydrocortisone cream 1 % Apply 1 Application topically 2 (two) times daily as needed for itching.   ketoconazole 2 % cream Commonly known as: NIZORAL Apply 1 Application topically 2 (two) times daily as needed for rash.   levothyroxine 75 MCG tablet Commonly known as: SYNTHROID Take 3 tablets (225 mcg total) by mouth daily at 6 (six) AM. Start taking on: May 29, 2022 What changed:  medication strength how much to take when to take this   loratadine 10 MG tablet Commonly known as: CLARITIN Take 10 mg by mouth daily as needed for allergies.   LORazepam 0.5 MG tablet Commonly known as: ATIVAN Take 1  tablet (0.5 mg total) by mouth daily as needed for anxiety or sleep.   lovastatin 40 MG tablet Commonly known as: MEVACOR Take 40 mg by mouth every evening.   metoprolol succinate 25 MG 24 hr tablet Commonly known as: TOPROL-XL Take 0.5 tablets (12.5 mg total) by mouth daily. Start taking on: May 29, 2022 What changed:  medication strength how much to take additional instructions   omeprazole 10 MG capsule Commonly known as: PRILOSEC Take 10 mg by mouth every evening.   potassium chloride SA 20 MEQ tablet Commonly known as: KLOR-CON M Take 1 tablet (20 mEq total) by mouth daily.   sacubitril-valsartan 24-26 MG Commonly known as: ENTRESTO TAKE 1 TABLET BY MOUTH TWICE A DAY What changed:  how much to take how to take this when to take this additional instructions   thiamine 50 MG tablet Commonly known as: VITAMIN B-1 Take 25 mg by mouth daily.   Vitamin D 50 MCG (2000 UT) Caps Take 2,000 Units by mouth daily.        Follow-up Information  Merrilee Seashore, MD Follow up on 05/26/2022.   Specialty: Internal Medicine Why: The office will call patient. Contact information: [place] [address] [phone]         [name], MD .   Specialty: Cardiology Contact information: [place] [address] [phone]         [facility] Follow up.   Specialty: Home Health Services Why: Your home health has been set up with [facility]. The office will call you with start of service information. If you have any questions or concerns please call the number listed above. Contact information: [address] [place] [address] [phone]                Discharge Exam: Danley Danker Weights   05/27/22 0133 05/27/22 0506 05/28/22 0014  Weight: 92.7 kg 95.7 kg 93.9 kg   BP (!) 126/59   Pulse 63   Temp (!) 97.3 F (36.3 C) (Oral)   Resp 17   Ht '5\' 6"'$  (1.676 m)   Wt  93.9 kg   SpO2 98%   BMI 33.41 kg/m   Patient with no chest pain, dyspnea has improved.   Neurology awake and alert ENT with no pallor Cardiovascular with S1 and S2 present and rhythmic with no gallops or murmurs Respiratory with no wheezing or rhonchi Abdomen not distended No lower extremity edema  Condition at discharge: stable  The results of significant diagnostics from this hospitalization (including imaging, microbiology, ancillary and laboratory) are listed below for reference.   Imaging Studies: DG Chest 2 View  Result Date: 05/28/2022 CLINICAL DATA:  517001, cardiac device in-situ, other. EXAM: CHEST - 2 VIEW COMPARISON:  Portable chest 05/22/2022. FINDINGS: 5:02 a.m. Interval insertion of a left chest dual lead pacing system. There is no pneumothorax postprocedure. One of the wires terminates in the right atrium and the other appears to terminate in the right ventricle. Stable appearance basilar atelectasis and small layering pleural effusions. No focal pneumonia is seen. Cardiomegaly. The central vessels are normal caliber. There is mild aortic tortuosity with stable mediastinum. There is osteopenia with chronic mild thoracic levoscoliosis and degenerative change. IMPRESSION: 1. Left chest dual lead pacemaker and wire insertions as described above. 2. Small pleural effusions and persistent bibasilar atelectasis. 3. Stable cardiomegaly without evidence of CHF. Electronically Signed   By: Telford Nab M.D.   On: 05/28/2022 07:05   EP PPM/ICD IMPLANT  Result Date: 05/27/2022 SURGEON:  Allegra Lai, MD   PREPROCEDURE DIAGNOSIS:  tachy/brady syndrome   POSTPROCEDURE DIAGNOSIS:  tachy/brady syndrome    PROCEDURES:  1. Pacemaker implantation.  2. Left upper extremity venography.   INTRODUCTION:  HANIFAH ROYSE is a 88 y.o. female with a history of bradycardia who presents today for pacemaker implantation.  The patient reports intermittent episodes of dizziness over the past few  months.  No reversible causes have been identified.  The patient therefore presents today for pacemaker implantation.   DESCRIPTION OF PROCEDURE:  Informed written consent was obtained, and  the patient was brought to the electrophysiology lab in a fasting state.  The patient required no sedation for the procedure today.  The patients left chest was prepped and draped in the usual sterile fashion by the EP lab staff. The skin overlying the left deltopectoral region was infiltrated with lidocaine for local analgesia.  A 4-cm incision was made over the left deltopectoral region.  A left subcutaneous pacemaker pocket was fashioned using  a combination of sharp and blunt dissection. Electrocautery was required to assure hemostasis.  Left Upper Extremity Venography: A venogram of the left upper extremity was performed, which revealed a large left cephalic vein, which emptied into a large left subclavian vein.  The left axillary vein was moderate in size.  RA/RV Lead Placement: The left axillary vein was therefore cannulated.  Through the left axillary vein, a Medtronic CapSureFix Novus [id number]  (serial number  [id number]) right atrial lead and an Medtronic SelectSure 6045 (serial number  [id number]) right ventricular lead were advanced with fluoroscopic visualization into the right atrial appendage and right ventricular apex positions respectively.  Initial atrial lead P- waves measured 1.4 mV with impedance of 573 ohms and a threshold of 1.1 V at 0.5 msec.  Right ventricular lead R-waves measured 7.2 mV with an impedance of 882 ohms and a threshold of 0.5 V at 0.5 msec.  Both leads were secured to the pectoralis fascia using #2-0 silk over the suture sleeves. Device Placement:  The leads were then connected to an Medtronic Azure XT DR [id number]   (serial number  [id number] ) pacemaker.  The pocket was irrigated with copious gentamicin solution.  The pacemaker was then placed into the pocket.  The pocket was then closed in 3  layers with 2.0 Vicryl suture for the 3.0 Vicryl suture subcutaneous and subcuticular layers.  Steri-  Strips and a sterile dressing were then applied. EBL<76m.  There were no early apparent complications.   CONCLUSIONS:  1. Successful implantation of a St Jude Medical Assurity MRI dual-chamber pacemaker for symptomatic bradycardia  2. No early apparent complications.       WAllegra Lai MD 05/27/2022 5:26 PM  DG Pelvis Portable  Result Date: 05/23/2022 CLINICAL DATA:  88year old female with confusion and pain. EXAM: PORTABLE PELVIS 1-2 VIEWS COMPARISON:  Fluoroscopic right hip injection 09/11/2010. FINDINGS: Portable AP supine view at 0630 hours. Femoral heads are normally located. Pelvis appears intact. SI joints and symphysis within normal limits. Grossly intact proximal femurs. Partially visible lumbar disc and endplate degeneration. No acute osseous abnormality identified. Visible bowel gas pattern is within normal limits. IMPRESSION: No acute osseous abnormality identified about the pelvis. If there is lateralizing hip pain recommend dedicated hip series. Electronically Signed   By: HGenevie AnnM.D.   On: 05/23/2022 07:08   MR BRAIN WO CONTRAST  Result Date: 05/23/2022 CLINICAL DATA:  Acute neurologic deficit EXAM: MRI HEAD WITHOUT CONTRAST TECHNIQUE: Multiplanar, multiecho pulse sequences of the brain and surrounding structures were obtained without intravenous contrast. COMPARISON:  None Available. FINDINGS: Brain: No acute infarct, mass effect or extra-axial collection. No acute or chronic hemorrhage. Normal white matter signal. Generalized volume loss. 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 Midline structures are normal. Vascular: Major flow voids are preserved. Skull and upper cervical spine: Normal calvarium and skull base. Visualized upper cervical spine and soft tissues are normal. Sinuses/Orbits:No paranasal sinus fluid levels or advanced mucosal thickening. No mastoid or middle ear  effusion. Normal orbits. IMPRESSION: 1. No acute intracranial abnormality. 2. 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 Electronically Signed   By: KUlyses JarredM.D.   On: 05/23/2022 02:02   DG Chest Port 1 View  Result Date: 05/22/2022 CLINICAL DATA:  Bradycardia and shortness of breath EXAM: PORTABLE CHEST 1 VIEW COMPARISON:  Chest x-ray dated December 28, 2021 FINDINGS: Patient is rotated to the right. Cardiac and mediastinal contours are unchanged when accounting for exam technique. Elevation of the right  hemidiaphragm. Mild basilar opacities, likely due to atelectasis. No large pleural effusion or evidence of pneumothorax. Old left-sided rib fractures. IMPRESSION: Mild basilar opacities, likely due to atelectasis. Electronically Signed   By: Yetta Glassman M.D.   On: 05/22/2022 20:29    Microbiology: Results for orders placed or performed during the hospital encounter of 05/22/22  Resp Panel by RT-PCR (Flu A&B, Covid) Anterior Nasal Swab     Status: None   Collection Time: 05/22/22  8:11 PM   Specimen: Anterior Nasal Swab  Result Value Ref Range Status   SARS Coronavirus 2 by RT PCR NEGATIVE NEGATIVE Final    Comment: (NOTE) SARS-CoV-2 target nucleic acids are NOT DETECTED.  The SARS-CoV-2 RNA is generally detectable in upper respiratory specimens during the acute phase of infection. The lowest concentration of SARS-CoV-2 viral copies this assay can detect is 138 copies/mL. A negative result does not preclude SARS-Cov-2 infection and should not be used as the sole basis for treatment or other patient management decisions. A negative result may occur with  improper specimen collection/handling, submission of specimen other than nasopharyngeal swab, presence of viral mutation(s) within the areas targeted by this assay, and inadequate number of viral copies(<138 copies/mL). A negative result must be combined with clinical observations, patient history, and  epidemiological information. The expected result is Negative.  Fact Sheet for Patients:  EntrepreneurPulse.com.au  Fact Sheet for Healthcare Providers:  IncredibleEmployment.be  This test is no t yet approved or cleared by the [place] FDA and  has been authorized for detection and/or diagnosis of SARS-CoV-2 by FDA under an Emergency Use Authorization (EUA). This EUA will remain  in effect (meaning this test can be used) for the duration of the COVID-19 declaration under Section 564(b)(1) of the Act, 21 U.S.C.section 360bbb-3(b)(1), unless the authorization is terminated  or revoked sooner.       Influenza A by PCR NEGATIVE NEGATIVE Final   Influenza B by PCR NEGATIVE NEGATIVE Final    Comment: (NOTE) The Xpert Xpress SARS-CoV-2/FLU/RSV plus assay is intended as an aid in the diagnosis of influenza from Nasopharyngeal swab specimens and should not be used as a sole basis for treatment. Nasal washings and aspirates are unacceptable for Xpert Xpress SARS-CoV-2/FLU/RSV testing.  Fact Sheet for Patients: EntrepreneurPulse.com.au  Fact Sheet for Healthcare Providers: IncredibleEmployment.be  This test is not yet approved or cleared by the [place] FDA and has been authorized for detection and/or diagnosis of SARS-CoV-2 by FDA under an Emergency Use Authorization (EUA). This EUA will remain in effect (meaning this test can be used) for the duration of the COVID-19 declaration under Section 564(b)(1) of the Act, 21 U.S.C. section 360bbb-3(b)(1), unless the authorization is terminated or revoked.  Performed at [facility], [place] [address], [place], [place] [address]   Urine Culture     Status: Abnormal   Collection Time: 05/23/22  8:49 AM   Specimen: Urine, Clean Catch  Result Value Ref Range Status   Specimen Description URINE, CLEAN CATCH  Final   Special Requests   Final     NONE Performed at [facility], [place] [address], [place], [place] [address]    Culture >=100,000 COLONIES/mL KLEBSIELLA PNEUMONIAE (A)  Final   Report Status 05/25/2022 FINAL  Final   Organism ID, Bacteria KLEBSIELLA PNEUMONIAE (A)  Final      Susceptibility   Klebsiella pneumoniae - MIC*    AMPICILLIN RESISTANT Resistant     CEFAZOLIN <=4 SENSITIVE Sensitive  CEFEPIME <=0.12 SENSITIVE Sensitive     CEFTRIAXONE <=0.25 SENSITIVE Sensitive     CIPROFLOXACIN <=0.25 SENSITIVE Sensitive     GENTAMICIN <=1 SENSITIVE Sensitive     IMIPENEM <=0.25 SENSITIVE Sensitive     NITROFURANTOIN <=16 SENSITIVE Sensitive     TRIMETH/SULFA <=20 SENSITIVE Sensitive     AMPICILLIN/SULBACTAM 4 SENSITIVE Sensitive     PIP/TAZO <=4 SENSITIVE Sensitive     * >=100,000 COLONIES/mL KLEBSIELLA PNEUMONIAE  Surgical PCR screen     Status: None   Collection Time: 05/26/22  7:05 PM   Specimen: Nasal Mucosa; Nasal Swab  Result Value Ref Range Status   MRSA, PCR NEGATIVE NEGATIVE Final   Staphylococcus aureus NEGATIVE NEGATIVE Final    Comment: (NOTE) The Xpert SA Assay (FDA approved for NASAL specimens in patients 42 years of age and older), is one component of a comprehensive surveillance program. It is not intended to diagnose infection nor to guide or monitor treatment. Performed at [facility], [place] [address], [place], [place] [address]     Labs: CBC: Recent Labs  Lab 05/22/22 2010 05/22/22 2027 05/23/22 0155 05/24/22 0344 05/25/22 0450 05/28/22 0633  WBC 6.8  --  7.5 6.9 7.5 4.6  NEUTROABS 3.9  --   --   --   --   --   HGB 12.4 13.3 12.4 11.0* 11.0* 10.4*  HCT 40.2 39.0 37.9 34.6* 33.8* 32.2*  MCV 88.0  --  84.8 85.4 84.5 84.7  PLT 208  --  175 163 176 824   Basic Metabolic Panel: Recent Labs  Lab 05/22/22 2201 05/23/22 0155 05/24/22 0344 05/25/22 0450 05/26/22 0747 05/27/22 0430 05/28/22 0633  NA  --    < > 137 131* 135 134* 136  K  --    < > 4.0 3.9 3.9 4.2 4.3   CL  --    < > 106 102 103 107 106  CO2  --    < > '24 22 23 23 23  '$ GLUCOSE  --    < > 100* 100* 112* 90 77  BUN  --    < > 29* 25* '20 19 15  '$ CREATININE  --    < > 1.76* 1.43* 1.33* 1.26* 1.06*  CALCIUM  --    < > 8.6* 8.5* 8.7* 8.4* 8.8*  MG 2.0  --   --   --   --   --   --    < > = values in this interval not displayed.   Liver Function Tests: Recent Labs  Lab 05/22/22 2010 05/23/22 0155 05/24/22 0344  AST 51* 41 20  ALT 43 38 25  ALKPHOS 92 85 71  BILITOT 0.4 0.5 0.6  PROT 6.3* 6.4* 5.7*  ALBUMIN 3.6 3.5 3.1*   CBG: Recent Labs  Lab 05/22/22 2352 05/23/22 0606 05/23/22 1122 05/23/22 2113  GLUCAP 103* 87 87 118*    Discharge time spent: greater than 30 minutes.  Signed: Tawni Millers, MD [facility] 05/28/2022

## 2022-05-28 NOTE — Progress Notes (Signed)
   05/28/22 1000  Mobility  Activity Ambulated with assistance in hallway  Level of Assistance Minimal assist, patient does 75% or more  Assistive Device Front wheel walker  Distance Ambulated (ft) 60 ft  Activity Response Tolerated well  $Mobility charge 1 Mobility   Mobility Specialist Progress Note  Received pt in bed having no complaints and agreeable to mobility. Pt was asymptomatic throughout ambulation and returned to room w/o fault. Left in bed w/ call bell in reach and alarm on [name] Mobility Specialist

## 2022-05-28 NOTE — Progress Notes (Signed)
PT Cancellation Note  Patient Details Name: [name] MRN: [id number] DOB: 14-Aug-1931   Cancelled Treatment:    Reason Eval/Treat Not Completed: Pain limiting ability to participate;Fatigue/lethargy limiting ability to participate this afternoon after rolling in bed for cleaning with RN staff. Will return as time/schedule allows to prepare for d/c.  [place] Carbo, PT, DPT   Acute Rehabilitation Department   Sandra Cockayne 05/28/2022, 1:19 PM

## 2022-05-28 NOTE — Discharge Instructions (Addendum)
After Your Pacemaker   You have a [employer] Pacemaker  ACTIVITY Do not lift your arm above shoulder height for 1 week after your procedure. After 7 days, you may progress as below.  You should remove your sling 24 hours after your procedure, unless otherwise instructed by your provider.     Wednesday June 04, 2022  Thursday June 05, 2022 Friday June 06, 2022 Saturday June 07, 2022   Do not lift, push, pull, or carry anything over 10 pounds with the affected arm until 6 weeks (Wednesday July 09, 2022 ) after your procedure.   You may drive AFTER your wound check, unless you have been told otherwise by your provider.   Ask your healthcare provider when you can go back to work   INCISION/Dressing If you are on a blood thinner such as Coumadin, Xarelto, Eliquis, Plavix, or Pradaxa please confirm with your provider when this should be resumed.   If large square, outer bandage is left in place, this can be removed after 24 hours from your procedure. Do not remove steri-strips or glue as below.   Monitor your Pacemaker site for redness, swelling, and drainage. Call the device clinic at [phone] if you experience these symptoms or fever/chills.  If your incision is sealed with Steri-strips or staples, you may shower 7 days after your procedure or when told by your provider. Do not remove the steri-strips or let the shower hit directly on your site. You may wash around your site with soap and water.    If you were discharged in a sling, please do not wear this during the day more than 48 hours after your surgery unless otherwise instructed. This may increase the risk of stiffness and soreness in your shoulder.   Avoid lotions, ointments, or perfumes over your incision until it is well-healed.  You may use a hot tub or a pool AFTER your wound check appointment if the incision is completely closed.  Pacemaker Alerts:  Some alerts are vibratory and others beep. These are NOT  emergencies. Please call our office to let us know. If this occurs at night or on weekends, it can wait until the next business day. Send a remote transmission.  If your device is capable of reading fluid status (for heart failure), you will be offered monthly monitoring to review this with you.   DEVICE MANAGEMENT Remote monitoring is used to monitor your pacemaker from home. This monitoring is scheduled every 91 days by our office. It allows [place] to keep an eye on the functioning of your device to ensure it is working properly. You will routinely see your Electrophysiologist annually (more often if necessary).   You should receive your ID card for your new device in 4-8 weeks. Keep this card with you at all times once received. Consider wearing a medical alert bracelet or necklace.  Your Pacemaker may be MRI compatible. This will be discussed at your next office visit/wound check.  You should avoid contact with strong electric or magnetic fields.   Do not use amateur (ham) radio equipment or electric (arc) welding torches. MP3 player headphones with magnets should not be used. Some devices are safe to use if held at least 12 inches (30 cm) from your Pacemaker. These include power tools, lawn mowers, and speakers. If you are unsure if something is safe to use, ask your health care provider.  When using your cell phone, hold it to the ear that is on the opposite side from the  Pacemaker. Do not leave your cell phone in a pocket over the Pacemaker.  You may safely use electric blankets, heating pads, computers, and microwave ovens.  Call the office right away if: You have chest pain. You feel more short of breath than you have felt before. You feel more light-headed than you have felt before. Your incision starts to open up.  This information is not intended to replace advice given to you by your health care provider. Make sure you discuss any questions you have with your health care provider.     ============================================================================================  Information on my medicine - ELIQUIS (apixaban)  This medication education was reviewed with me or my healthcare representative as part of my discharge preparation.    Why was Eliquis prescribed for you? Eliquis was prescribed for you to reduce the risk of a blood clot forming that can cause a stroke if you have a medical condition called atrial fibrillation (a type of irregular heartbeat).  What do You need to know about Eliquis ? Take your Eliquis TWICE DAILY - one tablet in the morning and one tablet in the evening with or without food. If you have difficulty swallowing the tablet whole please discuss with your pharmacist how to take the medication safely.  Take Eliquis exactly as prescribed by your doctor and DO NOT stop taking Eliquis without talking to the doctor who prescribed the medication.  Stopping may increase your risk of developing a stroke.  Refill your prescription before you run out.  After discharge, you should have regular check-up appointments with your healthcare provider that is prescribing your Eliquis.  In the future your dose may need to be changed if your kidney function or weight changes by a significant amount or as you get older.  What do you do if you miss a dose? If you miss a dose, take it as soon as you remember on the same day and resume taking twice daily.  Do not take more than one dose of ELIQUIS at the same time to make up a missed dose.  Important Safety Information A possible side effect of Eliquis is bleeding. You should call your healthcare provider right away if you experience any of the following: Bleeding from an injury or your nose that does not stop. Unusual colored urine (red or dark brown) or unusual colored stools (red or black). Unusual bruising for unknown reasons. A serious fall or if you hit your head (even if there is no  bleeding).  Some medicines may interact with Eliquis and might increase your risk of bleeding or clotting while on Eliquis. To help avoid this, consult your healthcare provider or pharmacist prior to using any new prescription or non-prescription medications, including herbals, vitamins, non-steroidal anti-inflammatory drugs (NSAIDs) and supplements.  This website has more information on Eliquis (apixaban): http://www.eliquis.com/eliquis/home

## 2022-05-28 NOTE — Care Management Important Message (Signed)
Important Message  Patient Details  Name: [name] MRN: [id number] Date of Birth: [birth year]   Medicare Important Message Given:  Yes     Shelda Altes 05/28/2022, 7:44 AM

## 2022-05-28 NOTE — Assessment & Plan Note (Addendum)
Continue with levothyroxine at a hight dose of 225 mcg daily Her TSH is very elevated and her free T 3 is low.

## 2022-05-28 NOTE — Assessment & Plan Note (Addendum)
Echocardiogram with mild reduction in LV systolic function with ED 45 to 50%, with global hypokinesis, RV systolic function preserved, moderate MR. Mild to moderate TR.   Patient was placed on furosemide for diuresis.   Plan to continue furosemide as outpatient, continue with metoprolol.  Continue with Delene Loll,

## 2022-05-28 NOTE — Assessment & Plan Note (Signed)
CKD stage 3a.  Patient with recovering renal function, at the time of her discharge her serum cr is down to 1,0 with at 4,3 and serum bicarbonate 23.   Plan to follow up renal function as outpatient.

## 2022-05-28 NOTE — Hospital Course (Signed)
[name] was admitted to the hospital with the working diagnosis of symptomatic bradycardia.   88 yo female with the past medical history of heart failure, atrial fibrillation, and hypothyroidism who presented with dizziness and weakness. She was noted to have a low pulse and low blood pressure, EMS was called and patient was transported to the ED. Her heart rate was in the 30's and she received 2 doses of atropine on route. In the ED she received further atropine, her HR 69, blood pressure 134/57, RR 22 and 02 saturation 88%, lungs with no rales or wheezing, heart with S1 and S2 present and bradycardic, abdomen not distended and no lower extremity edema.   Na 139, K 4,2 CL 108, bicarbonate 21 glucose 99, bun 25 cr 1,68  BNP 335  High sensitive troponin 6  Wbc 6,8 hgb 12,4 plt 208  Sars covid 19 negative.   Urine analysis SG 1,015, >50 wbc, 21-50 brc, 100 protein.  Urine culture positive for Klebsiella Pneumonia \  Chest radiograph with right rotation, mild fluid in the right fissure, no other infiltrates.   EKG 42 bpm, normal axis, 1st degree AV block, right bundle branch block, sinus rhythm with sinus block, no significant ST segment or T wave changes.   Patient had persistent bradycardia, and decision was made to proceed with pace maker implantation. She did received antibiotic therapy with ceftriaxone.

## 2022-05-30 DIAGNOSIS — I480 Paroxysmal atrial fibrillation: Secondary | ICD-10-CM | POA: Diagnosis not present

## 2022-05-30 DIAGNOSIS — Z6832 Body mass index (BMI) 32.0-32.9, adult: Secondary | ICD-10-CM | POA: Diagnosis not present

## 2022-05-30 DIAGNOSIS — E669 Obesity, unspecified: Secondary | ICD-10-CM | POA: Diagnosis not present

## 2022-05-30 DIAGNOSIS — Z950 Presence of cardiac pacemaker: Secondary | ICD-10-CM | POA: Diagnosis not present

## 2022-05-30 DIAGNOSIS — B961 Klebsiella pneumoniae [K. pneumoniae] as the cause of diseases classified elsewhere: Secondary | ICD-10-CM | POA: Diagnosis not present

## 2022-05-30 DIAGNOSIS — I4510 Unspecified right bundle-branch block: Secondary | ICD-10-CM | POA: Diagnosis not present

## 2022-05-30 DIAGNOSIS — Z90710 Acquired absence of both cervix and uterus: Secondary | ICD-10-CM | POA: Diagnosis not present

## 2022-05-30 DIAGNOSIS — I5040 Unspecified combined systolic (congestive) and diastolic (congestive) heart failure: Secondary | ICD-10-CM | POA: Diagnosis not present

## 2022-05-30 DIAGNOSIS — E039 Hypothyroidism, unspecified: Secondary | ICD-10-CM | POA: Diagnosis not present

## 2022-05-30 DIAGNOSIS — N179 Acute kidney failure, unspecified: Secondary | ICD-10-CM | POA: Diagnosis not present

## 2022-05-30 DIAGNOSIS — E785 Hyperlipidemia, unspecified: Secondary | ICD-10-CM | POA: Diagnosis not present

## 2022-05-30 DIAGNOSIS — K219 Gastro-esophageal reflux disease without esophagitis: Secondary | ICD-10-CM | POA: Diagnosis not present

## 2022-05-30 DIAGNOSIS — N390 Urinary tract infection, site not specified: Secondary | ICD-10-CM | POA: Diagnosis not present

## 2022-05-30 DIAGNOSIS — Z48812 Encounter for surgical aftercare following surgery on the circulatory system: Secondary | ICD-10-CM | POA: Diagnosis not present

## 2022-05-30 DIAGNOSIS — D631 Anemia in chronic kidney disease: Secondary | ICD-10-CM | POA: Diagnosis not present

## 2022-05-30 DIAGNOSIS — I2510 Atherosclerotic heart disease of native coronary artery without angina pectoris: Secondary | ICD-10-CM | POA: Diagnosis not present

## 2022-05-30 DIAGNOSIS — Z8701 Personal history of pneumonia (recurrent): Secondary | ICD-10-CM | POA: Diagnosis not present

## 2022-05-30 DIAGNOSIS — I5023 Acute on chronic systolic (congestive) heart failure: Secondary | ICD-10-CM | POA: Diagnosis not present

## 2022-05-30 DIAGNOSIS — M1990 Unspecified osteoarthritis, unspecified site: Secondary | ICD-10-CM | POA: Diagnosis not present

## 2022-05-30 DIAGNOSIS — N1831 Chronic kidney disease, stage 3a: Secondary | ICD-10-CM | POA: Diagnosis not present

## 2022-05-30 DIAGNOSIS — I130 Hypertensive heart and chronic kidney disease with heart failure and stage 1 through stage 4 chronic kidney disease, or unspecified chronic kidney disease: Secondary | ICD-10-CM | POA: Diagnosis not present

## 2022-05-30 DIAGNOSIS — G9341 Metabolic encephalopathy: Secondary | ICD-10-CM | POA: Diagnosis not present

## 2022-05-30 DIAGNOSIS — I495 Sick sinus syndrome: Secondary | ICD-10-CM | POA: Diagnosis not present

## 2022-05-30 DIAGNOSIS — I080 Rheumatic disorders of both mitral and aortic valves: Secondary | ICD-10-CM | POA: Diagnosis not present

## 2022-06-02 DIAGNOSIS — E039 Hypothyroidism, unspecified: Secondary | ICD-10-CM | POA: Diagnosis not present

## 2022-06-02 DIAGNOSIS — I5022 Chronic systolic (congestive) heart failure: Secondary | ICD-10-CM | POA: Diagnosis not present

## 2022-06-02 DIAGNOSIS — I10 Essential (primary) hypertension: Secondary | ICD-10-CM | POA: Diagnosis not present

## 2022-06-02 DIAGNOSIS — Z950 Presence of cardiac pacemaker: Secondary | ICD-10-CM | POA: Diagnosis not present

## 2022-06-02 DIAGNOSIS — D692 Other nonthrombocytopenic purpura: Secondary | ICD-10-CM | POA: Diagnosis not present

## 2022-06-02 DIAGNOSIS — R7303 Prediabetes: Secondary | ICD-10-CM | POA: Diagnosis not present

## 2022-06-02 DIAGNOSIS — I4819 Other persistent atrial fibrillation: Secondary | ICD-10-CM | POA: Diagnosis not present

## 2022-06-02 DIAGNOSIS — D6869 Other thrombophilia: Secondary | ICD-10-CM | POA: Diagnosis not present

## 2022-06-02 DIAGNOSIS — Z09 Encounter for follow-up examination after completed treatment for conditions other than malignant neoplasm: Secondary | ICD-10-CM | POA: Diagnosis not present

## 2022-06-03 DIAGNOSIS — I2510 Atherosclerotic heart disease of native coronary artery without angina pectoris: Secondary | ICD-10-CM | POA: Diagnosis not present

## 2022-06-03 DIAGNOSIS — D631 Anemia in chronic kidney disease: Secondary | ICD-10-CM | POA: Diagnosis not present

## 2022-06-03 DIAGNOSIS — N179 Acute kidney failure, unspecified: Secondary | ICD-10-CM | POA: Diagnosis not present

## 2022-06-03 DIAGNOSIS — B961 Klebsiella pneumoniae [K. pneumoniae] as the cause of diseases classified elsewhere: Secondary | ICD-10-CM | POA: Diagnosis not present

## 2022-06-03 DIAGNOSIS — I5040 Unspecified combined systolic (congestive) and diastolic (congestive) heart failure: Secondary | ICD-10-CM | POA: Diagnosis not present

## 2022-06-03 DIAGNOSIS — M1990 Unspecified osteoarthritis, unspecified site: Secondary | ICD-10-CM | POA: Diagnosis not present

## 2022-06-03 DIAGNOSIS — Z90710 Acquired absence of both cervix and uterus: Secondary | ICD-10-CM | POA: Diagnosis not present

## 2022-06-03 DIAGNOSIS — K219 Gastro-esophageal reflux disease without esophagitis: Secondary | ICD-10-CM | POA: Diagnosis not present

## 2022-06-03 DIAGNOSIS — E039 Hypothyroidism, unspecified: Secondary | ICD-10-CM | POA: Diagnosis not present

## 2022-06-03 DIAGNOSIS — Z8701 Personal history of pneumonia (recurrent): Secondary | ICD-10-CM | POA: Diagnosis not present

## 2022-06-03 DIAGNOSIS — G9341 Metabolic encephalopathy: Secondary | ICD-10-CM | POA: Diagnosis not present

## 2022-06-03 DIAGNOSIS — Z6832 Body mass index (BMI) 32.0-32.9, adult: Secondary | ICD-10-CM | POA: Diagnosis not present

## 2022-06-03 DIAGNOSIS — I495 Sick sinus syndrome: Secondary | ICD-10-CM | POA: Diagnosis not present

## 2022-06-03 DIAGNOSIS — I080 Rheumatic disorders of both mitral and aortic valves: Secondary | ICD-10-CM | POA: Diagnosis not present

## 2022-06-03 DIAGNOSIS — I4510 Unspecified right bundle-branch block: Secondary | ICD-10-CM | POA: Diagnosis not present

## 2022-06-03 DIAGNOSIS — I130 Hypertensive heart and chronic kidney disease with heart failure and stage 1 through stage 4 chronic kidney disease, or unspecified chronic kidney disease: Secondary | ICD-10-CM | POA: Diagnosis not present

## 2022-06-03 DIAGNOSIS — E669 Obesity, unspecified: Secondary | ICD-10-CM | POA: Diagnosis not present

## 2022-06-03 DIAGNOSIS — N1831 Chronic kidney disease, stage 3a: Secondary | ICD-10-CM | POA: Diagnosis not present

## 2022-06-03 DIAGNOSIS — N390 Urinary tract infection, site not specified: Secondary | ICD-10-CM | POA: Diagnosis not present

## 2022-06-03 DIAGNOSIS — I480 Paroxysmal atrial fibrillation: Secondary | ICD-10-CM | POA: Diagnosis not present

## 2022-06-03 DIAGNOSIS — Z950 Presence of cardiac pacemaker: Secondary | ICD-10-CM | POA: Diagnosis not present

## 2022-06-03 DIAGNOSIS — E785 Hyperlipidemia, unspecified: Secondary | ICD-10-CM | POA: Diagnosis not present

## 2022-06-03 DIAGNOSIS — Z48812 Encounter for surgical aftercare following surgery on the circulatory system: Secondary | ICD-10-CM | POA: Diagnosis not present

## 2022-06-03 DIAGNOSIS — I5023 Acute on chronic systolic (congestive) heart failure: Secondary | ICD-10-CM | POA: Diagnosis not present

## 2022-06-03 NOTE — Interval H&P Note (Signed)
History and Physical Interval Note:  06/03/2022 11:47 AM  [name]  has presented today for surgery, with the diagnosis of AFIB.  The various methods of treatment have been discussed with the patient and family. After consideration of risks, benefits and other options for treatment, the patient has consented to  Procedure(s): CARDIOVERSION (N/A) as a surgical intervention.  The patient's history has been reviewed, patient examined, no change in status, stable for surgery.  I have reviewed the patient's chart and labs.  Questions were answered to the patient's satisfaction.     [name], MD

## 2022-06-04 DIAGNOSIS — L728 Other follicular cysts of the skin and subcutaneous tissue: Secondary | ICD-10-CM | POA: Diagnosis not present

## 2022-06-04 DIAGNOSIS — L821 Other seborrheic keratosis: Secondary | ICD-10-CM | POA: Diagnosis not present

## 2022-06-05 DIAGNOSIS — D631 Anemia in chronic kidney disease: Secondary | ICD-10-CM | POA: Diagnosis not present

## 2022-06-05 DIAGNOSIS — B961 Klebsiella pneumoniae [K. pneumoniae] as the cause of diseases classified elsewhere: Secondary | ICD-10-CM | POA: Diagnosis not present

## 2022-06-05 DIAGNOSIS — I480 Paroxysmal atrial fibrillation: Secondary | ICD-10-CM | POA: Diagnosis not present

## 2022-06-05 DIAGNOSIS — M1990 Unspecified osteoarthritis, unspecified site: Secondary | ICD-10-CM | POA: Diagnosis not present

## 2022-06-05 DIAGNOSIS — Z90710 Acquired absence of both cervix and uterus: Secondary | ICD-10-CM | POA: Diagnosis not present

## 2022-06-05 DIAGNOSIS — I080 Rheumatic disorders of both mitral and aortic valves: Secondary | ICD-10-CM | POA: Diagnosis not present

## 2022-06-05 DIAGNOSIS — Z950 Presence of cardiac pacemaker: Secondary | ICD-10-CM | POA: Diagnosis not present

## 2022-06-05 DIAGNOSIS — E669 Obesity, unspecified: Secondary | ICD-10-CM | POA: Diagnosis not present

## 2022-06-05 DIAGNOSIS — I4510 Unspecified right bundle-branch block: Secondary | ICD-10-CM | POA: Diagnosis not present

## 2022-06-05 DIAGNOSIS — N390 Urinary tract infection, site not specified: Secondary | ICD-10-CM | POA: Diagnosis not present

## 2022-06-05 DIAGNOSIS — K219 Gastro-esophageal reflux disease without esophagitis: Secondary | ICD-10-CM | POA: Diagnosis not present

## 2022-06-05 DIAGNOSIS — I5023 Acute on chronic systolic (congestive) heart failure: Secondary | ICD-10-CM | POA: Diagnosis not present

## 2022-06-05 DIAGNOSIS — I495 Sick sinus syndrome: Secondary | ICD-10-CM | POA: Diagnosis not present

## 2022-06-05 DIAGNOSIS — I5040 Unspecified combined systolic (congestive) and diastolic (congestive) heart failure: Secondary | ICD-10-CM | POA: Diagnosis not present

## 2022-06-05 DIAGNOSIS — E039 Hypothyroidism, unspecified: Secondary | ICD-10-CM | POA: Diagnosis not present

## 2022-06-05 DIAGNOSIS — N179 Acute kidney failure, unspecified: Secondary | ICD-10-CM | POA: Diagnosis not present

## 2022-06-05 DIAGNOSIS — Z8701 Personal history of pneumonia (recurrent): Secondary | ICD-10-CM | POA: Diagnosis not present

## 2022-06-05 DIAGNOSIS — G9341 Metabolic encephalopathy: Secondary | ICD-10-CM | POA: Diagnosis not present

## 2022-06-05 DIAGNOSIS — I130 Hypertensive heart and chronic kidney disease with heart failure and stage 1 through stage 4 chronic kidney disease, or unspecified chronic kidney disease: Secondary | ICD-10-CM | POA: Diagnosis not present

## 2022-06-05 DIAGNOSIS — Z6832 Body mass index (BMI) 32.0-32.9, adult: Secondary | ICD-10-CM | POA: Diagnosis not present

## 2022-06-05 DIAGNOSIS — I2510 Atherosclerotic heart disease of native coronary artery without angina pectoris: Secondary | ICD-10-CM | POA: Diagnosis not present

## 2022-06-05 DIAGNOSIS — N1831 Chronic kidney disease, stage 3a: Secondary | ICD-10-CM | POA: Diagnosis not present

## 2022-06-05 DIAGNOSIS — Z48812 Encounter for surgical aftercare following surgery on the circulatory system: Secondary | ICD-10-CM | POA: Diagnosis not present

## 2022-06-05 DIAGNOSIS — E785 Hyperlipidemia, unspecified: Secondary | ICD-10-CM | POA: Diagnosis not present

## 2022-06-06 ENCOUNTER — Ambulatory Visit (HOSPITAL_BASED_OUTPATIENT_CLINIC_OR_DEPARTMENT_OTHER): Payer: Medicare HMO | Admitting: Family

## 2022-06-06 ENCOUNTER — Encounter (HOSPITAL_BASED_OUTPATIENT_CLINIC_OR_DEPARTMENT_OTHER): Payer: Self-pay | Admitting: Family

## 2022-06-06 ENCOUNTER — Telehealth: Payer: Self-pay | Admitting: Family

## 2022-06-06 VITALS — BP 140/66 | HR 61 | Ht 66.0 in | Wt 197.0 lb

## 2022-06-06 DIAGNOSIS — I10 Essential (primary) hypertension: Secondary | ICD-10-CM

## 2022-06-06 DIAGNOSIS — I5022 Chronic systolic (congestive) heart failure: Secondary | ICD-10-CM | POA: Diagnosis not present

## 2022-06-06 DIAGNOSIS — D6859 Other primary thrombophilia: Secondary | ICD-10-CM | POA: Diagnosis not present

## 2022-06-06 DIAGNOSIS — Z79899 Other long term (current) drug therapy: Secondary | ICD-10-CM

## 2022-06-06 DIAGNOSIS — E782 Mixed hyperlipidemia: Secondary | ICD-10-CM

## 2022-06-06 DIAGNOSIS — E039 Hypothyroidism, unspecified: Secondary | ICD-10-CM

## 2022-06-06 DIAGNOSIS — I4819 Other persistent atrial fibrillation: Secondary | ICD-10-CM | POA: Diagnosis not present

## 2022-06-06 MED ORDER — ENTRESTO 24-26 MG PO TABS
1.0000 | ORAL_TABLET | Freq: Two times a day (BID) | ORAL | 3 refills | Status: DC
Start: 2022-06-06 — End: 2022-06-24

## 2022-06-06 MED ORDER — SPIRONOLACTONE 25 MG PO TABS: 12.5000 mg | ORAL_TABLET | Freq: Every day | ORAL | 3 refills | Status: DC

## 2022-06-06 MED ORDER — ENTRESTO 49-51 MG PO TABS: 1.0000 | ORAL_TABLET | Freq: Two times a day (BID) | ORAL | 3 refills | Status: DC

## 2022-06-06 NOTE — Telephone Encounter (Signed)
Returned call to patient, provided the following recommendations, rx to pharmacy, patient has labs from appointment and will get them drawn. They will return patient assistance application to see if we can get higher dose entresto covered.       "If she wishes to remain on Entresto 24-'26mg'$  BID could instead start Spironolactone 12.'5mg'$  QD with BMP in 1-2 weeks.    Loel Dubonnet, NP "

## 2022-06-06 NOTE — Telephone Encounter (Signed)
[name]:  1. Name of Medication:   sacubitril-valsartan (ENTRESTO) 49-51 MG    2. How are you currently taking this medication (dosage and times per day)? Take 1 tablet by mouth 2 (two) times daily. - Oral  3. Are you having a reaction (difficulty breathing--STAT)?   4. What is your medication issue? Pt daughter called stating that this medication is now $500 with the dosage increase and need to know what to do next.

## 2022-06-06 NOTE — Progress Notes (Unsigned)
Office Visit    Patient Name: [name] Date of Encounter: 06/06/2022  PCP:  Merrilee Seashore, [place]  Cardiologist:  [name], MD  Advanced Practice Provider:  No care team member to display Electrophysiologist:  None      Chief Complaint    [name] is a 88 y.o. female presents today for hospital follow up  Past Medical History    Past Medical History:  Diagnosis Date   Dyspnea    diastolic dysfunction by echo 2012   GERD (gastroesophageal reflux disease)    Hypertension    Mild mitral and aortic regurgitation    Osteoarthritis    Right bundle branch block 2014   Thyroid disease    Past Surgical History:  Procedure Laterality Date   arm surgery     following an accident   CARDIOVERSION N/A 12/11/2021   Procedure: CARDIOVERSION;  Surgeon: Werner Lean, MD;  Location: [facility];  Service: Cardiovascular;  Laterality: N/A;   CARDIOVERSION N/A 05/22/2022   Procedure: CARDIOVERSION;  Surgeon: [name], MD;  Location: [facility];  Service: Cardiovascular;  Laterality: N/A;   KNEE SURGERY     Following an accident   PACEMAKER IMPLANT N/A 05/27/2022   Procedure: PACEMAKER IMPLANT;  Surgeon: Constance Haw, MD;  Location: [facility];  Service: Cardiovascular;  Laterality: N/A;   TEE WITHOUT CARDIOVERSION N/A 12/11/2021   Procedure: TRANSESOPHAGEAL ECHOCARDIOGRAM (TEE);  Surgeon: Werner Lean, MD;  Location: [facility] END[facility]COPY;  Service: Cardiovascular;  Laterality: N/A;   TONSILLECTOMY AND ADENOIDECTOMY     VAGINAL HYSTERECTOMY      Allergies  Allergies  Allergen Reactions   Fluoxetine     Other reaction(s): hyponatremia   Keflex [Cephalexin] Other (See Comments)    Daughter reports severe reaction specifically with keflex, Neither can remember what it was currently. Tolerated Cefdinir 05/2022   Serotonin Reuptake Inhibitors (Ssris)     Other reaction(s):  hyponatremia    History of Present Illness    [name] is a 88 y.o. female with hypertension, hyperlipidemia, RBBB, PPM, paroxysmal atrial fibrillation, chronic systolic and diastolic heart failure. Last seen while hospitalized.    Seen in the ED 12/08/2021 for new onset atrial fibrillation after presenting with shortness of breath and volume overload.  BNP elevated 453.  Echo with LVEF 45% with moderate MR.  She was diuresed 12 L.  Underwent TEE/DCCV with a quick return to atrial fibrillation.  Fluoxetine discontinued in the hospital due to hyponatremia and Synthroid dose adjusted.   Clinic 01/08/2022  Lasix was increased to 40 mg daily for 2 days. Entresto was reduced to half tablet twice daily to prevent hypotension but subsequently returned to 24-'26mg'$  BID dosing. Her Lasix dose has been intermittently adjusted.  At follow up 03/13/22 still with some dyspnea, cough. Did not feel she was strong enough for cardioversion and wished to wait. At follow up 04/17/22 she was agreeable to cardioversion but wished to wait until August.   05/22/22 underwent successful cardioversion. Returned to ED later that day with bradycardia requiring Atropine by EMS. Admitted 8/17-8/23/23 with persistent bradycardia despite discontinuation of Metoprolol and Amiodarone. PPM implanted. Discharged on Amiodarone and reduced dose Metoprolol.   Presents today for follow up with her daughter. Feels the best she has in some time. Discharge weight 203.9 lbs with weight today 197 lbs. Her morning shortness of breath has resolved. Much more energy. No chest pain, edema, orthopnea, PND,  palpitations. Still mild soreness at incisional site.   EKGs/Labs/Other Studies Reviewed:   The following studies were reviewed today:  ECHO 12/09/2021:   1. Left ventricular ejection fraction, by estimation, is 45%. The left  ventricle has mildly decreased function. The left ventricle demonstrates global hypokinesis. Left ventricular  diastolic parameters are indeterminate.   2. Right ventricular systolic function is mildly reduced. The right  ventricular size is normal. There is mildly elevated pulmonary artery systolic pressure. The estimated right ventricular systolic pressure is 50.0 mmHg.   3. Left atrial size was mildly dilated.   4. The mitral valve is abnormal. Moderate mitral valve regurgitation. No evidence of mitral stenosis.   5. The aortic valve is tricuspid. There is moderate calcification of the aortic valve. Aortic valve regurgitation is mild. Aortic valve  sclerosis/calcification is present, without any evidence of aortic  stenosis.   6. Aortic dilatation noted. There is mild dilatation of the ascending  aorta, measuring 40 mm.   7. The inferior vena cava is dilated in size with <50% respiratory  variability, suggesting right atrial pressure of 15 mmHg.   8. There was a left pleural effusion.   9. The patient was in atrial fibrillation.    ECHO TEE 12/11/2021:   1. Left ventricular ejection fraction, by estimation, is 45 to 50%. Left ventricular ejection fraction by 3D volume is 46 %. The left ventricle has mildly decreased function. The left ventricle demonstrates global hypokinesis. Left ventricular diastolic   function could not be evaluated.   2. Right ventricular systolic function is normal. The right ventricular size is normal.   3. Left atrial size was mildly dilated. No left atrial/left atrial  appendage thrombus was detected.   4. Large pleural effusion in the left lateral region.   5. The mitral valve is grossly normal. Moderate mitral valve  regurgitation. No evidence of mitral stenosis.   6. Tricuspid valve regurgitation is mild to moderate.   7. The aortic valve is tricuspid. Aortic valve regurgitation is mild to  moderate. No aortic stenosis is present. Aortic valve mean gradient  measures 3.0 mmHg.   8. There is mild (Grade II) plaque involving the descending aorta.   EKG:  EKG is   ordered today.  The ekg ordered today demonstrates atrial paced rhythm 61 bm with RBBB.   Recent Labs: 05/22/2022: B Natriuretic Peptide 335.8; Magnesium 2.0 05/23/2022: TSH 13.968 05/24/2022: ALT 25 05/28/2022: BUN 15; Creatinine, Ser 1.06; Hemoglobin 10.4; Platelets 192; Potassium 4.3; Sodium 136  Recent Lipid Panel No results found for: "CHOL", "TRIG", "HDL", "CHOLHDL", "VLDL", "LDLCALC", "LDLDIRECT"  Risk Assessment/Calculations:   CHA2DS2-VASc Score = 5   This indicates a 7.2% annual risk of stroke. The patient's score is based upon: CHF History: 1 HTN History: 1 Diabetes History: 0 Stroke History: 0 Vascular Disease History: 0 Age Score: 2 Gender Score: 1     Home Medications   Current Meds  Medication Sig   acetaminophen (TYLENOL) 500 MG tablet Take 1,000 mg by mouth every 6 (six) hours as needed for mild pain.   albuterol (VENTOLIN HFA) 108 (90 Base) MCG/ACT inhaler Inhale 1 puff into the lungs every 6 (six) hours as needed for wheezing or shortness of breath.   amiodarone (PACERONE) 200 MG tablet Take 1 tablet (200 mg total) by mouth daily.   apixaban (ELIQUIS) 5 MG TABS tablet Take 1 tablet (5 mg total) by mouth 2 (two) times daily.   benzonatate (TESSALON PERLES) 100 MG capsule Take 1 capsule (  100 mg total) by mouth 3 (three) times daily as needed for cough.   carboxymethylcellulose (REFRESH PLUS) 0.5 % SOLN Place 1 drop into both eyes daily as needed (dry eyes).   Cholecalciferol (VITAMIN D) 50 MCG (2000 UT) CAPS Take 2,000 Units by mouth daily.   cyanocobalamin (VITAMIN B12) 1000 MCG tablet Take 2,000 mcg by mouth daily.   furosemide (LASIX) 20 MG tablet Take '40mg'$  (2 tablets) daily. May take additional '20mg'$  (1 tablet) as needed for weight gain of 2 pounds overnight or 5 pounds in one week   hydrocortisone cream 1 % Apply 1 Application topically 2 (two) times daily as needed for itching.   ketoconazole (NIZORAL) 2 % cream Apply 1 Application topically 2 (two) times  daily as needed for rash.   levothyroxine (SYNTHROID) 75 MCG tablet Take 3 tablets (225 mcg total) by mouth daily at 6 (six) AM.   loratadine (CLARITIN) 10 MG tablet Take 10 mg by mouth daily as needed for allergies.   LORazepam (ATIVAN) 0.5 MG tablet Take 1 tablet (0.5 mg total) by mouth daily as needed for anxiety or sleep.   lovastatin (MEVACOR) 40 MG tablet Take 40 mg by mouth every evening.   metoprolol succinate (TOPROL-XL) 25 MG 24 hr tablet Take 0.5 tablets (12.5 mg total) by mouth daily.   omeprazole (PRILOSEC) 10 MG capsule Take 10 mg by mouth every evening.   potassium chloride SA (KLOR-CON M) 20 MEQ tablet Take 1 tablet (20 mEq total) by mouth daily.   sacubitril-valsartan (ENTRESTO) 49-51 MG Take 1 tablet by mouth 2 (two) times daily.   thiamine (VITAMIN B-1) 50 MG tablet Take 25 mg by mouth daily.   [DISCONTINUED] sacubitril-valsartan (ENTRESTO) 24-26 MG TAKE 1 TABLET BY MOUTH TWICE A DAY (Patient taking differently: Take 1 tablet by mouth 2 (two) times daily.)     Review of Systems      All other systems reviewed and are otherwise negative except as noted above.  Physical Exam    VS:  BP (!) 140/66   Pulse 61   Ht '5\' 6"'$  (1.676 m)   Wt 197 lb (89.4 kg)   BMI 31.80 kg/m  , BMI Body mass index is 31.8 kg/m.  Wt Readings from Last 3 Encounters:  06/06/22 197 lb (89.4 kg)  05/28/22 207 lb 0.2 oz (93.9 kg)  05/22/22 200 lb 6.4 oz (90.9 kg)    GEN: Well nourished, overweight, well developed, in no acute distress. HEENT: normal. Neck: Supple, no JVD, carotid bruits, or masses. Cardiac: RRR, no murmurs, rubs, or gallops. No clubbing, cyanosis, edema.  Radials/PT 2+ and equal bilaterally.  Respiratory:  Respirations regular and unlabored, clear to auscultation bilaterally. GI: Soft, nontender, nondistended. MS: No deformity or atrophy. Skin: Warm and dry, no rash. Neuro:  Strength and sensation are intact. Psych: Normal affect.  Assessment & Plan    PAF  /hypercoagulable state  PPM-  Doing well post PPM with energy increased.  PPM site covered by steri strips. Maintaining NSR beneath PPM. CHA2DS2-VASc Score = 5 [CHF History: 1, HTN History: 1, Diabetes History: 0, Stroke History: 0, Vascular Disease History: 0, Age Score: 2, Gender Score: 1].  Therefore, the patient's annual risk of stroke is 7.2 %.    Continue ELiquis '5mg'$  BID, denies bleeding complications.  Amiodarone monitoring: 05/2022 TSH 13.968 T4 1.4, T3 1.3 (levothyroxine dose adjusted during admission). 05/24/22 AST 20, ALT 25.   HFrEF -weight stable. LVEF 45%. Continue GDMT Entresto, Lasix, Toprol.  Increase Entresto  due to hypertension. Low sodium diet, fluid restriction <2L, and daily weights encouraged. Educated to contact our office for weight gain of 2 lbs overnight or 5 lbs in one week.  Prefers not to add additional medication at this time but spironolactone could be considered in the future.  Would defer SGLT2i due to hx of UTI requiring confusion with UTI during recent admission.   HLD -continue lovastatin per PCP  Hypothyroidism - Continue to follow with PCP.   HTn - BP not at goal. Increase Entresto to 49-'51mg'$  BID with BMP in 2 weeks.     Disposition: Follow up in 2 month(s) with [name], MD or APP.  Signed, Loel Dubonnet, NP 06/06/2022, 4:18 PM [place]

## 2022-06-06 NOTE — Patient Instructions (Addendum)
Medication Instructions:  Your physician has recommended you make the following change in your medication:   Change: Entresto 49-'51mg'$  twice daily. You may take two of your current tabs twice a day to use them up and then there will be a new prescription waiting.   *If you need a refill on your cardiac medications before your next appointment, please call your pharmacy*   Lab Work: Please return for Lab work in two weeks for BMP. You may come to the...   [facility] (3rd floor) [address] [place], [place] [address]  Open: 8am-Noon and 1pm-4:30pm  Please ring the doorbell on the small table when you exit the elevator and the Lab Tech will come get you  [place] at [facility] [address] [place], [place],  [address] Open: 8am-1pm, then 2pm-4:30pm   [place]- Please see attached locations sheet stapled to your lab work with address and hours.   If you have labs (blood work) drawn today and your tests are completely normal, you will receive your results only by: [place] (if you have MyChart) OR A paper copy in the mail If you have any lab test that is abnormal or we need to change your treatment, we will call you to review the results.  Follow-Up: At [facility], you and your health needs are our priority.  As part of our continuing mission to provide you with exceptional heart care, we have created designated Provider Care Teams.  These Care Teams include your primary Cardiologist (physician) and Advanced Practice Providers (APPs -  Physician Assistants and Nurse Practitioners) who all work together to provide you with the care you need, when you need it.  We recommend signing up for the patient portal called "MyChart".  Sign up information is provided on this After Visit Summary.  MyChart is used to connect with patients for Virtual Visits (Telemedicine).  Patients are able to view lab/test results, encounter notes,  upcoming appointments, etc.  Non-urgent messages can be sent to your provider as well.   To learn more about what you can do with MyChart, go to [email or web address].    Your next appointment:   Follow up as scheduled with EP     And  Follow up with Dr. Barbera Setters 21st at 11:40am

## 2022-06-06 NOTE — Telephone Encounter (Signed)
If she wishes to remain on Entresto 24-'26mg'$  BID could instead start Spironolactone 12.'5mg'$  QD with BMP in 1-2 weeks.   Loel Dubonnet, NP

## 2022-06-06 NOTE — Telephone Encounter (Signed)
Contacted pharmacy to see if patient needs new prior auth, prior auth not needed for new dose, patient cost is  [phone] or [phone]. Will call patient and offer patient assistance application.    Patient daughter is hesitant that they will get approved for patient assistance. We will mail the application to them at their request and see.   In the meantime they are wondering if she can be put on something else until they know if they will be covered or not.

## 2022-06-08 NOTE — Progress Notes (Unsigned)
Cardiology Office Note Date:  06/08/2022  Patient ID:  [name] [birth year], MRN [id number] PCP:  Merrilee Seashore, MD  Cardiologist:  Dr. Oval Linsey Electrophysiologist: Dr. Curt Bears  Chief Complaint:  wound check  History of Present Illness: [name] is a 88 y.o. female with history of RBBB, HTN, HLD, AFib, chronic CHF (combined), hypothyroidism, GERD  Admitted 05/22/22 for symptomatic bradycardia, she had been DCCV earlier in the day, initially did well, though later in the evening developed weakness, dizziness, and EMS was called when family noted low pulse. She required 2 doses of atropine, arrived in SB 40's with Mobitz one, her home amio, toprol held.  She was also treated for UTI and acute CHF (diastolic) Underwent implant 05/27/22 TSH was elevated, her synthroid dose adjusted and instructed to f/u with PMD Discharged 05/28/22  She saw C. Walker, NP 06/06/22, paced EKG, doing OK.  No changes were made, with recent UTI, SGLT21 not planned, and pt deferred any med changes, with consideration of spironolactone in the future.  She has seen her PMD since discharge  Increased Entresto dose was too expensive she remains on 24/'26mg'$  dosing pulse the spironolactone that she started yesterday  She feels well, skin irritation fro DCCV pads is finally better No CP, palpitations No wound concerns, looking forward to getting tape off. No near syncope or syncope  Home BP have been better 130's-140's the last couple days      Device information MDT dual chamber PPM implanted 05/27/22 RV lead is 3830 in LB/septal position   Past Medical History:  Diagnosis Date   Dyspnea    diastolic dysfunction by echo 2012   GERD (gastroesophageal reflux disease)    Hypertension    Mild mitral and aortic regurgitation    Osteoarthritis    Right bundle branch block 2014   Thyroid disease     Past Surgical History:  Procedure Laterality Date   arm surgery     following  an accident   CARDIOVERSION N/A 12/11/2021   Procedure: CARDIOVERSION;  Surgeon: Werner Lean, MD;  Location: [facility];  Service: Cardiovascular;  Laterality: N/A;   CARDIOVERSION N/A 05/22/2022   Procedure: CARDIOVERSION;  Surgeon: [name], MD;  Location: [facility];  Service: Cardiovascular;  Laterality: N/A;   KNEE SURGERY     Following an accident   PACEMAKER IMPLANT N/A 05/27/2022   Procedure: PACEMAKER IMPLANT;  Surgeon: Constance Haw, MD;  Location: [facility];  Service: Cardiovascular;  Laterality: N/A;   TEE WITHOUT CARDIOVERSION N/A 12/11/2021   Procedure: TRANSESOPHAGEAL ECHOCARDIOGRAM (TEE);  Surgeon: Werner Lean, MD;  Location: [facility];  Service: Cardiovascular;  Laterality: N/A;   TONSILLECTOMY AND ADENOIDECTOMY     VAGINAL HYSTERECTOMY      Current Outpatient Medications  Medication Sig Dispense Refill   acetaminophen (TYLENOL) 500 MG tablet Take 1,000 mg by mouth every 6 (six) hours as needed for mild pain.     albuterol (VENTOLIN HFA) 108 (90 Base) MCG/ACT inhaler Inhale 1 puff into the lungs every 6 (six) hours as needed for wheezing or shortness of breath.     amiodarone (PACERONE) 200 MG tablet Take 1 tablet (200 mg total) by mouth daily. 90 tablet 2   apixaban (ELIQUIS) 5 MG TABS tablet Take 1 tablet (5 mg total) by mouth 2 (two) times daily. 180 tablet 1   benzonatate (TESSALON PERLES) 100 MG capsule Take 1 capsule (100 mg total) by mouth 3 (three) times daily  as needed for cough. 20 capsule 0   carboxymethylcellulose (REFRESH PLUS) 0.5 % SOLN Place 1 drop into both eyes daily as needed (dry eyes).     Cholecalciferol (VITAMIN D) 50 MCG (2000 UT) CAPS Take 2,000 Units by mouth daily.     cyanocobalamin (VITAMIN B12) 1000 MCG tablet Take 2,000 mcg by mouth daily.     furosemide (LASIX) 20 MG tablet Take '40mg'$  (2 tablets) daily. May take additional '20mg'$  (1 tablet) as needed for weight gain of 2 pounds overnight or 5  pounds in one week 90 tablet 3   hydrocortisone cream 1 % Apply 1 Application topically 2 (two) times daily as needed for itching.     ketoconazole (NIZORAL) 2 % cream Apply 1 Application topically 2 (two) times daily as needed for rash.     levothyroxine (SYNTHROID) 75 MCG tablet Take 3 tablets (225 mcg total) by mouth daily at 6 (six) AM. 90 tablet 0   loratadine (CLARITIN) 10 MG tablet Take 10 mg by mouth daily as needed for allergies.     LORazepam (ATIVAN) 0.5 MG tablet Take 1 tablet (0.5 mg total) by mouth daily as needed for anxiety or sleep. 5 tablet 0   lovastatin (MEVACOR) 40 MG tablet Take 40 mg by mouth every evening.     metoprolol succinate (TOPROL-XL) 25 MG 24 hr tablet Take 0.5 tablets (12.5 mg total) by mouth daily. 15 tablet 0   omeprazole (PRILOSEC) 10 MG capsule Take 10 mg by mouth every evening.     potassium chloride SA (KLOR-CON M) 20 MEQ tablet Take 1 tablet (20 mEq total) by mouth daily. 90 tablet 3   sacubitril-valsartan (ENTRESTO) 24-26 MG Take 1 tablet by mouth 2 (two) times daily. 180 tablet 3   spironolactone (ALDACTONE) 25 MG tablet Take 0.5 tablets (12.5 mg total) by mouth daily. 45 tablet 3   thiamine (VITAMIN B-1) 50 MG tablet Take 25 mg by mouth daily.     No current facility-administered medications for this visit.    Allergies:   Fluoxetine, Keflex [cephalexin], and Serotonin reuptake inhibitors (ssris)   Social History:  The patient  reports that she has never smoked. She has never used smokeless tobacco. She reports that she does not drink alcohol and does not use drugs.   Family History:  The patient's family history includes Heart attack in her mother.  ROS:  Please see the history of present illness.    All other systems are reviewed and otherwise negative.   PHYSICAL EXAM:  VS:  There were no vitals taken for this visit. BMI: There is no height or weight on file to calculate BMI. Well nourished, well developed, in no acute distress HEENT:  normocephalic, atraumatic Neck: no JVD, carotid bruits or masses Cardiac:  RRR; no significant murmurs, no rubs, or gallops Lungs:  CTA b/l, no wheezing, rhonchi or rales Abd: soft, nontender MS: no deformity or atrophy Ext: no edema Skin: warm and dry, no rash Neuro:  No gross deficits appreciated Psych: euthymic mood, full affect  PPM site is stable, steri strips were removed without difficulty, wound edges are well approximated, no erythema, edema, heat.  No hematoma, appears well healed without signs of infection, no tethering or discomfort   EKG:  not done today  Device interrogation done today and reviewed by myself:  Battery and lead measurements are OK RA lead threshold today 1.75/0.4 and 1.25/0.8 No arrhythmias Acute implant lead outputs remain RA lead PW was increased to 0.8  ECHO 12/09/2021: 1. Left ventricular ejection fraction, by estimation, is 45%. The left  ventricle has mildly decreased function. The left ventricle demonstrates global hypokinesis. Left ventricular diastolic parameters are indeterminate.   2. Right ventricular systolic function is mildly reduced. The right  ventricular size is normal. There is mildly elevated pulmonary artery systolic pressure. The estimated right ventricular systolic pressure is 90.2 mmHg.   3. Left atrial size was mildly dilated.   4. The mitral valve is abnormal. Moderate mitral valve regurgitation. No evidence of mitral stenosis.   5. The aortic valve is tricuspid. There is moderate calcification of the aortic valve. Aortic valve regurgitation is mild. Aortic valve  sclerosis/calcification is present, without any evidence of aortic  stenosis.   6. Aortic dilatation noted. There is mild dilatation of the ascending  aorta, measuring 40 mm.   7. The inferior vena cava is dilated in size with <50% respiratory  variability, suggesting right atrial pressure of 15 mmHg.   8. There was a left pleural effusion.   9. The patient was  in atrial fibrillation.    ECHO TEE 12/11/2021: 1. Left ventricular ejection fraction, by estimation, is 45 to 50%. Left ventricular ejection fraction by 3D volume is 46 %. The left ventricle has mildly decreased function. The left ventricle demonstrates global hypokinesis. Left ventricular diastolic   function could not be evaluated.   2. Right ventricular systolic function is normal. The right ventricular size is normal.   3. Left atrial size was mildly dilated. No left atrial/left atrial  appendage thrombus was detected.   4. Large pleural effusion in the left lateral region.   5. The mitral valve is grossly normal. Moderate mitral valve  regurgitation. No evidence of mitral stenosis.   6. Tricuspid valve regurgitation is mild to moderate.   7. The aortic valve is tricuspid. Aortic valve regurgitation is mild to  moderate. No aortic stenosis is present. Aortic valve mean gradient  measures 3.0 mmHg.   8. There is mild (Grade II) plaque involving the descending aorta.     Recent Labs: 05/22/2022: B Natriuretic Peptide 335.8; Magnesium 2.0 05/23/2022: TSH 13.968 05/24/2022: ALT 25 05/28/2022: BUN 15; Creatinine, Ser 1.06; Hemoglobin 10.4; Platelets 192; Potassium 4.3; Sodium 136  No results found for requested labs within last 365 days.   Estimated Creatinine Clearance: 41.3 mL/min (A) (by C-G formula based on SCr of 1.06 mg/dL (H)).   Wt Readings from Last 3 Encounters:  06/06/22 197 lb (89.4 kg)  05/28/22 207 lb 0.2 oz (93.9 kg)  05/22/22 200 lb 6.4 oz (90.9 kg)     Other studies reviewed: Additional studies/records reviewed today include: summarized above  ASSESSMENT AND PLAN:  PPM Well healed As above  Persistent AFib CHA2DS2Vasc is 5, on eliquis,  appropriately dosed zero % burden continue amiodarone TSH beig followed with PMD  Chronic CHF (combined) No symptoms or exam findings of volume OL Following with cardiology team   4. HTN Home numbers are better Just  started spironolactone Labs planned with cards   Disposition: F/u with Dr. Curt Bears as scheduled, sooner if needed  Current medicines are reviewed at length with the patient today.  The patient did not have any concerns regarding medicines.  Venetia Night, PA-C 06/08/2022 8:47 AM     [address] [place] [address] [phone] (office)  [phone] (fax)

## 2022-06-10 ENCOUNTER — Encounter: Payer: Self-pay | Admitting: Physician Assistant

## 2022-06-10 ENCOUNTER — Ambulatory Visit: Payer: Medicare HMO | Attending: Physician Assistant | Admitting: Physician Assistant

## 2022-06-10 VITALS — BP 152/66 | HR 73 | Ht 66.0 in | Wt 196.0 lb

## 2022-06-10 DIAGNOSIS — I10 Essential (primary) hypertension: Secondary | ICD-10-CM

## 2022-06-10 DIAGNOSIS — I4819 Other persistent atrial fibrillation: Secondary | ICD-10-CM

## 2022-06-10 DIAGNOSIS — Z5189 Encounter for other specified aftercare: Secondary | ICD-10-CM | POA: Diagnosis not present

## 2022-06-10 DIAGNOSIS — Z950 Presence of cardiac pacemaker: Secondary | ICD-10-CM

## 2022-06-10 LAB — CUP PACEART INCLINIC DEVICE CHECK
Battery Remaining Longevity: 145 mo
Battery Voltage: 3.22 V
Brady Statistic AP VP Percent: 0.11 %
Brady Statistic AP VS Percent: 99.74 %
Brady Statistic AS VP Percent: 0 %
Brady Statistic AS VS Percent: 0.15 %
Brady Statistic RA Percent Paced: 99.84 %
Brady Statistic RV Percent Paced: 0.11 %
Date Time Interrogation Session: 20230905121203
Implantable Lead Implant Date: 20230822
Implantable Lead Implant Date: 20230822
Implantable Lead Location: 753859
Implantable Lead Location: 753860
Implantable Lead Model: 3830
Implantable Lead Model: 5076
Implantable Pulse Generator Implant Date: 20230822
Lead Channel Impedance Value: 342 Ohm
Lead Channel Impedance Value: 475 Ohm
Lead Channel Impedance Value: 494 Ohm
Lead Channel Impedance Value: 703 Ohm
Lead Channel Pacing Threshold Amplitude: 0.875 V
Lead Channel Pacing Threshold Pulse Width: 0.4 ms
Lead Channel Sensing Intrinsic Amplitude: 1.125 mV
Lead Channel Sensing Intrinsic Amplitude: 1.125 mV
Lead Channel Sensing Intrinsic Amplitude: 17.5 mV
Lead Channel Sensing Intrinsic Amplitude: 22.125 mV
Lead Channel Setting Pacing Amplitude: 3.5 V
Lead Channel Setting Pacing Amplitude: 3.5 V
Lead Channel Setting Pacing Pulse Width: 0.4 ms
Lead Channel Setting Sensing Sensitivity: 0.9 mV

## 2022-06-10 NOTE — Patient Instructions (Addendum)
Medication Instructions:   Your physician recommends that you continue on your current medications as directed. Please refer to the Current Medication list given to you today.   *If you need a refill on your cardiac medications before your next appointment, please call your pharmacy*   Lab Work: [place]   If you have labs (blood work) drawn today and your tests are completely normal, you will receive your results only by: [place] (if you have MyChart) OR A paper copy in the mail If you have any lab test that is abnormal or we need to change your treatment, we will call you to review the results.   Testing/Procedures: NONE ORDERED  TODAY   PLEASE CONTACT [facility] FOR DEVICE QUESTIONS: [place] SUPPORT [phone]  Follow-Up: At [facility], you and your health needs are our priority.  As part of our continuing mission to provide you with exceptional heart care, we have created designated Provider Care Teams.  These Care Teams include your primary Cardiologist (physician) and Advanced Practice Providers (APPs -  Physician Assistants and Nurse Practitioners) who all work together to provide you with the care you need, when you need it.  We recommend signing up for the patient portal called "MyChart".  Sign up information is provided on this After Visit Summary.  MyChart is used to connect with patients for Virtual Visits (Telemedicine).  Patients are able to view lab/test results, encounter notes, upcoming appointments, etc.  Non-urgent messages can be sent to your provider as well.   To learn more about what you can do with MyChart, go to [email or web address].    Your next appointment: AS SCHEDULED    Other Instructions   Important Information About Sugar

## 2022-06-11 DIAGNOSIS — Z6832 Body mass index (BMI) 32.0-32.9, adult: Secondary | ICD-10-CM | POA: Diagnosis not present

## 2022-06-11 DIAGNOSIS — Z90710 Acquired absence of both cervix and uterus: Secondary | ICD-10-CM | POA: Diagnosis not present

## 2022-06-11 DIAGNOSIS — I130 Hypertensive heart and chronic kidney disease with heart failure and stage 1 through stage 4 chronic kidney disease, or unspecified chronic kidney disease: Secondary | ICD-10-CM | POA: Diagnosis not present

## 2022-06-11 DIAGNOSIS — B961 Klebsiella pneumoniae [K. pneumoniae] as the cause of diseases classified elsewhere: Secondary | ICD-10-CM | POA: Diagnosis not present

## 2022-06-11 DIAGNOSIS — M1990 Unspecified osteoarthritis, unspecified site: Secondary | ICD-10-CM | POA: Diagnosis not present

## 2022-06-11 DIAGNOSIS — I5023 Acute on chronic systolic (congestive) heart failure: Secondary | ICD-10-CM | POA: Diagnosis not present

## 2022-06-11 DIAGNOSIS — I2510 Atherosclerotic heart disease of native coronary artery without angina pectoris: Secondary | ICD-10-CM | POA: Diagnosis not present

## 2022-06-11 DIAGNOSIS — I5040 Unspecified combined systolic (congestive) and diastolic (congestive) heart failure: Secondary | ICD-10-CM | POA: Diagnosis not present

## 2022-06-11 DIAGNOSIS — E669 Obesity, unspecified: Secondary | ICD-10-CM | POA: Diagnosis not present

## 2022-06-11 DIAGNOSIS — Z8701 Personal history of pneumonia (recurrent): Secondary | ICD-10-CM | POA: Diagnosis not present

## 2022-06-11 DIAGNOSIS — K219 Gastro-esophageal reflux disease without esophagitis: Secondary | ICD-10-CM | POA: Diagnosis not present

## 2022-06-11 DIAGNOSIS — Z48812 Encounter for surgical aftercare following surgery on the circulatory system: Secondary | ICD-10-CM | POA: Diagnosis not present

## 2022-06-11 DIAGNOSIS — I080 Rheumatic disorders of both mitral and aortic valves: Secondary | ICD-10-CM | POA: Diagnosis not present

## 2022-06-11 DIAGNOSIS — E785 Hyperlipidemia, unspecified: Secondary | ICD-10-CM | POA: Diagnosis not present

## 2022-06-11 DIAGNOSIS — I480 Paroxysmal atrial fibrillation: Secondary | ICD-10-CM | POA: Diagnosis not present

## 2022-06-11 DIAGNOSIS — N390 Urinary tract infection, site not specified: Secondary | ICD-10-CM | POA: Diagnosis not present

## 2022-06-11 DIAGNOSIS — N1831 Chronic kidney disease, stage 3a: Secondary | ICD-10-CM | POA: Diagnosis not present

## 2022-06-11 DIAGNOSIS — E039 Hypothyroidism, unspecified: Secondary | ICD-10-CM | POA: Diagnosis not present

## 2022-06-11 DIAGNOSIS — G9341 Metabolic encephalopathy: Secondary | ICD-10-CM | POA: Diagnosis not present

## 2022-06-11 DIAGNOSIS — D631 Anemia in chronic kidney disease: Secondary | ICD-10-CM | POA: Diagnosis not present

## 2022-06-11 DIAGNOSIS — I495 Sick sinus syndrome: Secondary | ICD-10-CM | POA: Diagnosis not present

## 2022-06-11 DIAGNOSIS — I4510 Unspecified right bundle-branch block: Secondary | ICD-10-CM | POA: Diagnosis not present

## 2022-06-11 DIAGNOSIS — N179 Acute kidney failure, unspecified: Secondary | ICD-10-CM | POA: Diagnosis not present

## 2022-06-11 DIAGNOSIS — Z950 Presence of cardiac pacemaker: Secondary | ICD-10-CM | POA: Diagnosis not present

## 2022-06-12 DIAGNOSIS — Z90710 Acquired absence of both cervix and uterus: Secondary | ICD-10-CM | POA: Diagnosis not present

## 2022-06-12 DIAGNOSIS — M1990 Unspecified osteoarthritis, unspecified site: Secondary | ICD-10-CM | POA: Diagnosis not present

## 2022-06-12 DIAGNOSIS — G9341 Metabolic encephalopathy: Secondary | ICD-10-CM | POA: Diagnosis not present

## 2022-06-12 DIAGNOSIS — N1831 Chronic kidney disease, stage 3a: Secondary | ICD-10-CM | POA: Diagnosis not present

## 2022-06-12 DIAGNOSIS — I495 Sick sinus syndrome: Secondary | ICD-10-CM | POA: Diagnosis not present

## 2022-06-12 DIAGNOSIS — D631 Anemia in chronic kidney disease: Secondary | ICD-10-CM | POA: Diagnosis not present

## 2022-06-12 DIAGNOSIS — Z8701 Personal history of pneumonia (recurrent): Secondary | ICD-10-CM | POA: Diagnosis not present

## 2022-06-12 DIAGNOSIS — E669 Obesity, unspecified: Secondary | ICD-10-CM | POA: Diagnosis not present

## 2022-06-12 DIAGNOSIS — I130 Hypertensive heart and chronic kidney disease with heart failure and stage 1 through stage 4 chronic kidney disease, or unspecified chronic kidney disease: Secondary | ICD-10-CM | POA: Diagnosis not present

## 2022-06-12 DIAGNOSIS — Z48812 Encounter for surgical aftercare following surgery on the circulatory system: Secondary | ICD-10-CM | POA: Diagnosis not present

## 2022-06-12 DIAGNOSIS — Z6832 Body mass index (BMI) 32.0-32.9, adult: Secondary | ICD-10-CM | POA: Diagnosis not present

## 2022-06-12 DIAGNOSIS — I480 Paroxysmal atrial fibrillation: Secondary | ICD-10-CM | POA: Diagnosis not present

## 2022-06-12 DIAGNOSIS — I4510 Unspecified right bundle-branch block: Secondary | ICD-10-CM | POA: Diagnosis not present

## 2022-06-12 DIAGNOSIS — I2510 Atherosclerotic heart disease of native coronary artery without angina pectoris: Secondary | ICD-10-CM | POA: Diagnosis not present

## 2022-06-12 DIAGNOSIS — I080 Rheumatic disorders of both mitral and aortic valves: Secondary | ICD-10-CM | POA: Diagnosis not present

## 2022-06-12 DIAGNOSIS — E039 Hypothyroidism, unspecified: Secondary | ICD-10-CM | POA: Diagnosis not present

## 2022-06-12 DIAGNOSIS — N390 Urinary tract infection, site not specified: Secondary | ICD-10-CM | POA: Diagnosis not present

## 2022-06-12 DIAGNOSIS — Z950 Presence of cardiac pacemaker: Secondary | ICD-10-CM | POA: Diagnosis not present

## 2022-06-12 DIAGNOSIS — B961 Klebsiella pneumoniae [K. pneumoniae] as the cause of diseases classified elsewhere: Secondary | ICD-10-CM | POA: Diagnosis not present

## 2022-06-12 DIAGNOSIS — E785 Hyperlipidemia, unspecified: Secondary | ICD-10-CM | POA: Diagnosis not present

## 2022-06-12 DIAGNOSIS — I5040 Unspecified combined systolic (congestive) and diastolic (congestive) heart failure: Secondary | ICD-10-CM | POA: Diagnosis not present

## 2022-06-12 DIAGNOSIS — I5023 Acute on chronic systolic (congestive) heart failure: Secondary | ICD-10-CM | POA: Diagnosis not present

## 2022-06-12 DIAGNOSIS — K219 Gastro-esophageal reflux disease without esophagitis: Secondary | ICD-10-CM | POA: Diagnosis not present

## 2022-06-12 DIAGNOSIS — N179 Acute kidney failure, unspecified: Secondary | ICD-10-CM | POA: Diagnosis not present

## 2022-06-17 DIAGNOSIS — I080 Rheumatic disorders of both mitral and aortic valves: Secondary | ICD-10-CM | POA: Diagnosis not present

## 2022-06-17 DIAGNOSIS — Z90710 Acquired absence of both cervix and uterus: Secondary | ICD-10-CM | POA: Diagnosis not present

## 2022-06-17 DIAGNOSIS — I5040 Unspecified combined systolic (congestive) and diastolic (congestive) heart failure: Secondary | ICD-10-CM | POA: Diagnosis not present

## 2022-06-17 DIAGNOSIS — E039 Hypothyroidism, unspecified: Secondary | ICD-10-CM | POA: Diagnosis not present

## 2022-06-17 DIAGNOSIS — I495 Sick sinus syndrome: Secondary | ICD-10-CM | POA: Diagnosis not present

## 2022-06-17 DIAGNOSIS — N390 Urinary tract infection, site not specified: Secondary | ICD-10-CM | POA: Diagnosis not present

## 2022-06-17 DIAGNOSIS — I130 Hypertensive heart and chronic kidney disease with heart failure and stage 1 through stage 4 chronic kidney disease, or unspecified chronic kidney disease: Secondary | ICD-10-CM | POA: Diagnosis not present

## 2022-06-17 DIAGNOSIS — I2510 Atherosclerotic heart disease of native coronary artery without angina pectoris: Secondary | ICD-10-CM | POA: Diagnosis not present

## 2022-06-17 DIAGNOSIS — K219 Gastro-esophageal reflux disease without esophagitis: Secondary | ICD-10-CM | POA: Diagnosis not present

## 2022-06-17 DIAGNOSIS — N1831 Chronic kidney disease, stage 3a: Secondary | ICD-10-CM | POA: Diagnosis not present

## 2022-06-17 DIAGNOSIS — Z6832 Body mass index (BMI) 32.0-32.9, adult: Secondary | ICD-10-CM | POA: Diagnosis not present

## 2022-06-17 DIAGNOSIS — Z48812 Encounter for surgical aftercare following surgery on the circulatory system: Secondary | ICD-10-CM | POA: Diagnosis not present

## 2022-06-17 DIAGNOSIS — G9341 Metabolic encephalopathy: Secondary | ICD-10-CM | POA: Diagnosis not present

## 2022-06-17 DIAGNOSIS — Z8701 Personal history of pneumonia (recurrent): Secondary | ICD-10-CM | POA: Diagnosis not present

## 2022-06-17 DIAGNOSIS — D631 Anemia in chronic kidney disease: Secondary | ICD-10-CM | POA: Diagnosis not present

## 2022-06-17 DIAGNOSIS — I4510 Unspecified right bundle-branch block: Secondary | ICD-10-CM | POA: Diagnosis not present

## 2022-06-17 DIAGNOSIS — M1990 Unspecified osteoarthritis, unspecified site: Secondary | ICD-10-CM | POA: Diagnosis not present

## 2022-06-17 DIAGNOSIS — E669 Obesity, unspecified: Secondary | ICD-10-CM | POA: Diagnosis not present

## 2022-06-17 DIAGNOSIS — N179 Acute kidney failure, unspecified: Secondary | ICD-10-CM | POA: Diagnosis not present

## 2022-06-17 DIAGNOSIS — I5023 Acute on chronic systolic (congestive) heart failure: Secondary | ICD-10-CM | POA: Diagnosis not present

## 2022-06-17 DIAGNOSIS — I480 Paroxysmal atrial fibrillation: Secondary | ICD-10-CM | POA: Diagnosis not present

## 2022-06-17 DIAGNOSIS — Z950 Presence of cardiac pacemaker: Secondary | ICD-10-CM | POA: Diagnosis not present

## 2022-06-17 DIAGNOSIS — E785 Hyperlipidemia, unspecified: Secondary | ICD-10-CM | POA: Diagnosis not present

## 2022-06-17 DIAGNOSIS — B961 Klebsiella pneumoniae [K. pneumoniae] as the cause of diseases classified elsewhere: Secondary | ICD-10-CM | POA: Diagnosis not present

## 2022-06-20 DIAGNOSIS — I10 Essential (primary) hypertension: Secondary | ICD-10-CM | POA: Diagnosis not present

## 2022-06-20 LAB — BASIC METABOLIC PANEL
BUN/Creatinine Ratio: 15 (ref 12–28)
BUN: 20 mg/dL (ref 8–27)
CO2: 23 mmol/L (ref 20–29)
Calcium: 9.6 mg/dL (ref 8.7–10.3)
Chloride: 99 mmol/L (ref 96–106)
Creatinine, Ser: 1.34 mg/dL — ABNORMAL HIGH (ref 0.57–1.00)
Glucose: 98 mg/dL (ref 70–99)
Potassium: 4.9 mmol/L (ref 3.5–5.2)
Sodium: 137 mmol/L (ref 134–144)
eGFR: 38 mL/min/{1.73_m2} — ABNORMAL LOW (ref >59)

## 2022-06-23 ENCOUNTER — Telehealth: Payer: Self-pay | Admitting: Cardiology

## 2022-06-23 ENCOUNTER — Telehealth (HOSPITAL_BASED_OUTPATIENT_CLINIC_OR_DEPARTMENT_OTHER): Payer: Self-pay | Admitting: Cardiovascular Disease

## 2022-06-23 MED ORDER — METOPROLOL SUCCINATE ER 25 MG PO TB24: 12.5000 mg | ORAL_TABLET | Freq: Every day | ORAL | 3 refills | Status: DC

## 2022-06-23 NOTE — Telephone Encounter (Signed)
This is okay correct?

## 2022-06-23 NOTE — Telephone Encounter (Signed)
Patient's daughter is calling stating the patient is scheduled to get her flu shot on 09/29 and her covid vaccine on 10/01. They are wanting to confirm if it is okay for the patient to do this so soon in the arm on the same side that her device was recently placed. Melissa states they having a scheduled meeting at 11:30 they cannot miss so she is requesting a callback this afternoon regarding this so she is able to answer. Please advise.

## 2022-06-23 NOTE — Telephone Encounter (Signed)
Fine to get immunization in either arm.  Loel Dubonnet, NP

## 2022-06-23 NOTE — Telephone Encounter (Signed)
Results called to patient who verbalizes understanding! The following recommendations were provided to the patient.    Per Laurann Montana, NP   "Fine to get immunization in either arm.   Loel Dubonnet, NP "   Loel Dubonnet, NP  Gerald Stabs, RN Kidney function overall stable compared to previous. Continue current medications.

## 2022-06-23 NOTE — Telephone Encounter (Signed)
*  STAT* If patient is at the pharmacy, call can be transferred to refill team.   1. Which medications need to be refilled? (please list name of each medication and dose if known)  metoprolol succinate (TOPROL-XL) 25 MG 24 hr tablet  2. Which pharmacy/location (including street and city if local pharmacy) is medication to be sent to? [place], [place] - [address]  3. Do they need a 30 day or 90 day supply? 90 day supply

## 2022-06-23 NOTE — Telephone Encounter (Signed)
Rx request sent to pharmacy.  

## 2022-06-24 ENCOUNTER — Telehealth (HOSPITAL_BASED_OUTPATIENT_CLINIC_OR_DEPARTMENT_OTHER): Payer: Self-pay | Admitting: *Deleted

## 2022-06-24 DIAGNOSIS — I4891 Unspecified atrial fibrillation: Secondary | ICD-10-CM

## 2022-06-24 MED ORDER — ENTRESTO 24-26 MG PO TABS: 1.0000 | ORAL_TABLET | Freq: Two times a day (BID) | ORAL | 3 refills | Status: DC

## 2022-06-24 MED ORDER — APIXABAN 5 MG PO TABS: 5.0000 mg | ORAL_TABLET | Freq: Two times a day (BID) | ORAL | 3 refills | Status: DC

## 2022-06-24 NOTE — Telephone Encounter (Signed)
Patient dropped off patient assistance papers for Entresto and Eliquis Will fax once Dr Oval Linsey has signed

## 2022-06-25 DIAGNOSIS — D631 Anemia in chronic kidney disease: Secondary | ICD-10-CM | POA: Diagnosis not present

## 2022-06-25 DIAGNOSIS — I5040 Unspecified combined systolic (congestive) and diastolic (congestive) heart failure: Secondary | ICD-10-CM | POA: Diagnosis not present

## 2022-06-25 DIAGNOSIS — I4510 Unspecified right bundle-branch block: Secondary | ICD-10-CM | POA: Diagnosis not present

## 2022-06-25 DIAGNOSIS — I480 Paroxysmal atrial fibrillation: Secondary | ICD-10-CM | POA: Diagnosis not present

## 2022-06-25 DIAGNOSIS — Z6832 Body mass index (BMI) 32.0-32.9, adult: Secondary | ICD-10-CM | POA: Diagnosis not present

## 2022-06-25 DIAGNOSIS — I080 Rheumatic disorders of both mitral and aortic valves: Secondary | ICD-10-CM | POA: Diagnosis not present

## 2022-06-25 DIAGNOSIS — I130 Hypertensive heart and chronic kidney disease with heart failure and stage 1 through stage 4 chronic kidney disease, or unspecified chronic kidney disease: Secondary | ICD-10-CM | POA: Diagnosis not present

## 2022-06-25 DIAGNOSIS — I2510 Atherosclerotic heart disease of native coronary artery without angina pectoris: Secondary | ICD-10-CM | POA: Diagnosis not present

## 2022-06-25 DIAGNOSIS — N179 Acute kidney failure, unspecified: Secondary | ICD-10-CM | POA: Diagnosis not present

## 2022-06-25 DIAGNOSIS — G9341 Metabolic encephalopathy: Secondary | ICD-10-CM | POA: Diagnosis not present

## 2022-06-25 DIAGNOSIS — I495 Sick sinus syndrome: Secondary | ICD-10-CM | POA: Diagnosis not present

## 2022-06-25 DIAGNOSIS — M1990 Unspecified osteoarthritis, unspecified site: Secondary | ICD-10-CM | POA: Diagnosis not present

## 2022-06-25 DIAGNOSIS — E785 Hyperlipidemia, unspecified: Secondary | ICD-10-CM | POA: Diagnosis not present

## 2022-06-25 DIAGNOSIS — N390 Urinary tract infection, site not specified: Secondary | ICD-10-CM | POA: Diagnosis not present

## 2022-06-25 DIAGNOSIS — Z48812 Encounter for surgical aftercare following surgery on the circulatory system: Secondary | ICD-10-CM | POA: Diagnosis not present

## 2022-06-25 DIAGNOSIS — Z8701 Personal history of pneumonia (recurrent): Secondary | ICD-10-CM | POA: Diagnosis not present

## 2022-06-25 DIAGNOSIS — Z90710 Acquired absence of both cervix and uterus: Secondary | ICD-10-CM | POA: Diagnosis not present

## 2022-06-25 DIAGNOSIS — K219 Gastro-esophageal reflux disease without esophagitis: Secondary | ICD-10-CM | POA: Diagnosis not present

## 2022-06-25 DIAGNOSIS — I5023 Acute on chronic systolic (congestive) heart failure: Secondary | ICD-10-CM | POA: Diagnosis not present

## 2022-06-25 DIAGNOSIS — B961 Klebsiella pneumoniae [K. pneumoniae] as the cause of diseases classified elsewhere: Secondary | ICD-10-CM | POA: Diagnosis not present

## 2022-06-25 DIAGNOSIS — E669 Obesity, unspecified: Secondary | ICD-10-CM | POA: Diagnosis not present

## 2022-06-25 DIAGNOSIS — N1831 Chronic kidney disease, stage 3a: Secondary | ICD-10-CM | POA: Diagnosis not present

## 2022-06-25 DIAGNOSIS — Z950 Presence of cardiac pacemaker: Secondary | ICD-10-CM | POA: Diagnosis not present

## 2022-06-25 DIAGNOSIS — E039 Hypothyroidism, unspecified: Secondary | ICD-10-CM | POA: Diagnosis not present

## 2022-06-26 DIAGNOSIS — Z48812 Encounter for surgical aftercare following surgery on the circulatory system: Secondary | ICD-10-CM | POA: Diagnosis not present

## 2022-06-26 DIAGNOSIS — E669 Obesity, unspecified: Secondary | ICD-10-CM | POA: Diagnosis not present

## 2022-06-26 DIAGNOSIS — K219 Gastro-esophageal reflux disease without esophagitis: Secondary | ICD-10-CM | POA: Diagnosis not present

## 2022-06-26 DIAGNOSIS — Z90710 Acquired absence of both cervix and uterus: Secondary | ICD-10-CM | POA: Diagnosis not present

## 2022-06-26 DIAGNOSIS — Z950 Presence of cardiac pacemaker: Secondary | ICD-10-CM | POA: Diagnosis not present

## 2022-06-26 DIAGNOSIS — N1831 Chronic kidney disease, stage 3a: Secondary | ICD-10-CM | POA: Diagnosis not present

## 2022-06-26 DIAGNOSIS — I495 Sick sinus syndrome: Secondary | ICD-10-CM | POA: Diagnosis not present

## 2022-06-26 DIAGNOSIS — E039 Hypothyroidism, unspecified: Secondary | ICD-10-CM | POA: Diagnosis not present

## 2022-06-26 DIAGNOSIS — I4510 Unspecified right bundle-branch block: Secondary | ICD-10-CM | POA: Diagnosis not present

## 2022-06-26 DIAGNOSIS — Z6832 Body mass index (BMI) 32.0-32.9, adult: Secondary | ICD-10-CM | POA: Diagnosis not present

## 2022-06-26 DIAGNOSIS — N179 Acute kidney failure, unspecified: Secondary | ICD-10-CM | POA: Diagnosis not present

## 2022-06-26 DIAGNOSIS — Z8701 Personal history of pneumonia (recurrent): Secondary | ICD-10-CM | POA: Diagnosis not present

## 2022-06-26 DIAGNOSIS — I5040 Unspecified combined systolic (congestive) and diastolic (congestive) heart failure: Secondary | ICD-10-CM | POA: Diagnosis not present

## 2022-06-26 DIAGNOSIS — B961 Klebsiella pneumoniae [K. pneumoniae] as the cause of diseases classified elsewhere: Secondary | ICD-10-CM | POA: Diagnosis not present

## 2022-06-26 DIAGNOSIS — I480 Paroxysmal atrial fibrillation: Secondary | ICD-10-CM | POA: Diagnosis not present

## 2022-06-26 DIAGNOSIS — I5023 Acute on chronic systolic (congestive) heart failure: Secondary | ICD-10-CM | POA: Diagnosis not present

## 2022-06-26 DIAGNOSIS — N390 Urinary tract infection, site not specified: Secondary | ICD-10-CM | POA: Diagnosis not present

## 2022-06-26 DIAGNOSIS — I2510 Atherosclerotic heart disease of native coronary artery without angina pectoris: Secondary | ICD-10-CM | POA: Diagnosis not present

## 2022-06-26 DIAGNOSIS — D631 Anemia in chronic kidney disease: Secondary | ICD-10-CM | POA: Diagnosis not present

## 2022-06-26 DIAGNOSIS — M1990 Unspecified osteoarthritis, unspecified site: Secondary | ICD-10-CM | POA: Diagnosis not present

## 2022-06-26 DIAGNOSIS — I080 Rheumatic disorders of both mitral and aortic valves: Secondary | ICD-10-CM | POA: Diagnosis not present

## 2022-06-26 DIAGNOSIS — G9341 Metabolic encephalopathy: Secondary | ICD-10-CM | POA: Diagnosis not present

## 2022-06-26 DIAGNOSIS — E785 Hyperlipidemia, unspecified: Secondary | ICD-10-CM | POA: Diagnosis not present

## 2022-06-26 DIAGNOSIS — I130 Hypertensive heart and chronic kidney disease with heart failure and stage 1 through stage 4 chronic kidney disease, or unspecified chronic kidney disease: Secondary | ICD-10-CM | POA: Diagnosis not present

## 2022-07-01 DIAGNOSIS — E669 Obesity, unspecified: Secondary | ICD-10-CM | POA: Diagnosis not present

## 2022-07-01 DIAGNOSIS — I080 Rheumatic disorders of both mitral and aortic valves: Secondary | ICD-10-CM | POA: Diagnosis not present

## 2022-07-01 DIAGNOSIS — I4510 Unspecified right bundle-branch block: Secondary | ICD-10-CM | POA: Diagnosis not present

## 2022-07-01 DIAGNOSIS — G9341 Metabolic encephalopathy: Secondary | ICD-10-CM | POA: Diagnosis not present

## 2022-07-01 DIAGNOSIS — I2510 Atherosclerotic heart disease of native coronary artery without angina pectoris: Secondary | ICD-10-CM | POA: Diagnosis not present

## 2022-07-01 DIAGNOSIS — Z90710 Acquired absence of both cervix and uterus: Secondary | ICD-10-CM | POA: Diagnosis not present

## 2022-07-01 DIAGNOSIS — I495 Sick sinus syndrome: Secondary | ICD-10-CM | POA: Diagnosis not present

## 2022-07-01 DIAGNOSIS — I480 Paroxysmal atrial fibrillation: Secondary | ICD-10-CM | POA: Diagnosis not present

## 2022-07-01 DIAGNOSIS — I5040 Unspecified combined systolic (congestive) and diastolic (congestive) heart failure: Secondary | ICD-10-CM | POA: Diagnosis not present

## 2022-07-01 DIAGNOSIS — Z950 Presence of cardiac pacemaker: Secondary | ICD-10-CM | POA: Diagnosis not present

## 2022-07-01 DIAGNOSIS — Z48812 Encounter for surgical aftercare following surgery on the circulatory system: Secondary | ICD-10-CM | POA: Diagnosis not present

## 2022-07-01 DIAGNOSIS — N390 Urinary tract infection, site not specified: Secondary | ICD-10-CM | POA: Diagnosis not present

## 2022-07-01 DIAGNOSIS — Z6832 Body mass index (BMI) 32.0-32.9, adult: Secondary | ICD-10-CM | POA: Diagnosis not present

## 2022-07-01 DIAGNOSIS — E039 Hypothyroidism, unspecified: Secondary | ICD-10-CM | POA: Diagnosis not present

## 2022-07-01 DIAGNOSIS — I5023 Acute on chronic systolic (congestive) heart failure: Secondary | ICD-10-CM | POA: Diagnosis not present

## 2022-07-01 DIAGNOSIS — N1831 Chronic kidney disease, stage 3a: Secondary | ICD-10-CM | POA: Diagnosis not present

## 2022-07-01 DIAGNOSIS — N179 Acute kidney failure, unspecified: Secondary | ICD-10-CM | POA: Diagnosis not present

## 2022-07-01 DIAGNOSIS — E785 Hyperlipidemia, unspecified: Secondary | ICD-10-CM | POA: Diagnosis not present

## 2022-07-01 DIAGNOSIS — B961 Klebsiella pneumoniae [K. pneumoniae] as the cause of diseases classified elsewhere: Secondary | ICD-10-CM | POA: Diagnosis not present

## 2022-07-01 DIAGNOSIS — Z8701 Personal history of pneumonia (recurrent): Secondary | ICD-10-CM | POA: Diagnosis not present

## 2022-07-01 DIAGNOSIS — D631 Anemia in chronic kidney disease: Secondary | ICD-10-CM | POA: Diagnosis not present

## 2022-07-01 DIAGNOSIS — M1990 Unspecified osteoarthritis, unspecified site: Secondary | ICD-10-CM | POA: Diagnosis not present

## 2022-07-01 DIAGNOSIS — I130 Hypertensive heart and chronic kidney disease with heart failure and stage 1 through stage 4 chronic kidney disease, or unspecified chronic kidney disease: Secondary | ICD-10-CM | POA: Diagnosis not present

## 2022-07-01 DIAGNOSIS — K219 Gastro-esophageal reflux disease without esophagitis: Secondary | ICD-10-CM | POA: Diagnosis not present

## 2022-07-04 DIAGNOSIS — Z23 Encounter for immunization: Secondary | ICD-10-CM | POA: Diagnosis not present

## 2022-07-04 DIAGNOSIS — H353132 Nonexudative age-related macular degeneration, bilateral, intermediate dry stage: Secondary | ICD-10-CM | POA: Diagnosis not present

## 2022-07-04 DIAGNOSIS — H524 Presbyopia: Secondary | ICD-10-CM | POA: Diagnosis not present

## 2022-07-07 DIAGNOSIS — N390 Urinary tract infection, site not specified: Secondary | ICD-10-CM | POA: Diagnosis not present

## 2022-07-07 DIAGNOSIS — I480 Paroxysmal atrial fibrillation: Secondary | ICD-10-CM | POA: Diagnosis not present

## 2022-07-07 DIAGNOSIS — I4510 Unspecified right bundle-branch block: Secondary | ICD-10-CM | POA: Diagnosis not present

## 2022-07-07 DIAGNOSIS — D631 Anemia in chronic kidney disease: Secondary | ICD-10-CM | POA: Diagnosis not present

## 2022-07-07 DIAGNOSIS — N1831 Chronic kidney disease, stage 3a: Secondary | ICD-10-CM | POA: Diagnosis not present

## 2022-07-07 DIAGNOSIS — Z6832 Body mass index (BMI) 32.0-32.9, adult: Secondary | ICD-10-CM | POA: Diagnosis not present

## 2022-07-07 DIAGNOSIS — I130 Hypertensive heart and chronic kidney disease with heart failure and stage 1 through stage 4 chronic kidney disease, or unspecified chronic kidney disease: Secondary | ICD-10-CM | POA: Diagnosis not present

## 2022-07-07 DIAGNOSIS — Z8701 Personal history of pneumonia (recurrent): Secondary | ICD-10-CM | POA: Diagnosis not present

## 2022-07-07 DIAGNOSIS — I5040 Unspecified combined systolic (congestive) and diastolic (congestive) heart failure: Secondary | ICD-10-CM | POA: Diagnosis not present

## 2022-07-07 DIAGNOSIS — I495 Sick sinus syndrome: Secondary | ICD-10-CM | POA: Diagnosis not present

## 2022-07-07 DIAGNOSIS — I5023 Acute on chronic systolic (congestive) heart failure: Secondary | ICD-10-CM | POA: Diagnosis not present

## 2022-07-07 DIAGNOSIS — G9341 Metabolic encephalopathy: Secondary | ICD-10-CM | POA: Diagnosis not present

## 2022-07-07 DIAGNOSIS — Z48812 Encounter for surgical aftercare following surgery on the circulatory system: Secondary | ICD-10-CM | POA: Diagnosis not present

## 2022-07-07 DIAGNOSIS — Z90710 Acquired absence of both cervix and uterus: Secondary | ICD-10-CM | POA: Diagnosis not present

## 2022-07-07 DIAGNOSIS — B961 Klebsiella pneumoniae [K. pneumoniae] as the cause of diseases classified elsewhere: Secondary | ICD-10-CM | POA: Diagnosis not present

## 2022-07-07 DIAGNOSIS — K219 Gastro-esophageal reflux disease without esophagitis: Secondary | ICD-10-CM | POA: Diagnosis not present

## 2022-07-07 DIAGNOSIS — E785 Hyperlipidemia, unspecified: Secondary | ICD-10-CM | POA: Diagnosis not present

## 2022-07-07 DIAGNOSIS — N179 Acute kidney failure, unspecified: Secondary | ICD-10-CM | POA: Diagnosis not present

## 2022-07-07 DIAGNOSIS — Z950 Presence of cardiac pacemaker: Secondary | ICD-10-CM | POA: Diagnosis not present

## 2022-07-07 DIAGNOSIS — E669 Obesity, unspecified: Secondary | ICD-10-CM | POA: Diagnosis not present

## 2022-07-07 DIAGNOSIS — M1990 Unspecified osteoarthritis, unspecified site: Secondary | ICD-10-CM | POA: Diagnosis not present

## 2022-07-07 DIAGNOSIS — I2510 Atherosclerotic heart disease of native coronary artery without angina pectoris: Secondary | ICD-10-CM | POA: Diagnosis not present

## 2022-07-07 DIAGNOSIS — E039 Hypothyroidism, unspecified: Secondary | ICD-10-CM | POA: Diagnosis not present

## 2022-07-07 DIAGNOSIS — I080 Rheumatic disorders of both mitral and aortic valves: Secondary | ICD-10-CM | POA: Diagnosis not present

## 2022-07-09 NOTE — Telephone Encounter (Signed)
Mychart message below sent to patient   We received notification that the [facility] patient assistance has been approved through 10/05/2022. The Eliquis Dr Oval Linsey did not date so had to refax her page she filled out. I did get confirmation was received. As soon as I hear from them I will let you know

## 2022-07-12 DIAGNOSIS — Z0100 Encounter for examination of eyes and vision without abnormal findings: Secondary | ICD-10-CM | POA: Diagnosis not present

## 2022-07-16 ENCOUNTER — Telehealth: Payer: Self-pay | Admitting: Cardiovascular Disease

## 2022-07-16 NOTE — Telephone Encounter (Signed)
Eliquis prior auth submitted 10/4 by Alvina Filbert, will have her follow up upon return tomorrow. [place] they are requesting samples of Eliquis we are getting more on Thursday.    Patient should be taking Amiodarone '200mg'$  daily.   Returned call to Ephraim at [facility] to provide dose clarification on Amiodarone and Prior auth status for Eliquis.

## 2022-07-16 NOTE — Telephone Encounter (Signed)
  [name]:  1. Name of Medication: apixaban (ELIQUIS) 5 MG TABS tablet amiodarone (PACERONE) 200 MG tablet  2. How are you currently taking this medication (dosage and times per day)?  As written   3. Are you having a reaction (difficulty breathing--STAT)? No   4. What is your medication issue?   [name] - [facility] calling, they would like to get an update for pt's prior auth for Eliquis and would like to verify dosage for pt's amiodarone

## 2022-07-17 NOTE — Telephone Encounter (Signed)
Received notification that patient needed to spend 3% out of pocket Called patient assistance and she needs to pay out of pocket $475.79, income on their end $31,000 According to application income $78,588 If she has not spent out of pocket deductible will need following 1-pharmacy summary 2-does she file federal tax? If so needs first 2 pages of tax return 3-fax social security wage information (faxed but they did not receive)   Called patient, went to voicemail, will reach out to [place]

## 2022-07-17 NOTE — Telephone Encounter (Signed)
See 10/11 phone note

## 2022-07-18 NOTE — Telephone Encounter (Signed)
Advised [place] and left message for [name] to call back to discuss

## 2022-07-20 ENCOUNTER — Other Ambulatory Visit: Payer: Self-pay | Admitting: Cardiovascular Disease

## 2022-07-20 DIAGNOSIS — I4891 Unspecified atrial fibrillation: Secondary | ICD-10-CM

## 2022-07-21 NOTE — Telephone Encounter (Signed)
May provide 2 weeks of samples of Eliquis if needed - one time only.  It will be expensive until she spends the $475.79 out of pocket. With Medicare has to spend 3% out of pocket prior to them assisting with medication cost. Anticipate after she spends the $475.79 it will reduce to approx $45/month.   If she thinks she spent that much already - she will need to provide the information previously detailed by [facility].   Loel Dubonnet, NP

## 2022-07-21 NOTE — Telephone Encounter (Signed)
[name] is returning [name]'s call. She is requesting a callback regarding this ASAP. [facility] advised it would be over $400 for this medication. Please advise.

## 2022-07-21 NOTE — Telephone Encounter (Signed)
Returned call to patient and she spoke with Roosvelt Harps and knows what documents she needs, she is planning to get that paperwork today and get it faxed. She is going to stop by today and pick up 2 weeks of samples.     "May provide 2 weeks of samples of Eliquis if needed - one time only.   It will be expensive until she spends the $475.79 out of pocket. With Medicare has to spend 3% out of pocket prior to them assisting with medication cost. Anticipate after she spends the $475.79 it will reduce to approx $45/month.    If she thinks she spent that much already - she will need to provide the information previously detailed by [facility].    Loel Dubonnet, NP "

## 2022-07-21 NOTE — Telephone Encounter (Signed)
Eliquis '5mg'$  refill request received. Patient is 88 years old, weight-88.9kg, Crea-1.34 on 06/20/2022, Diagnosis-Afib, and last seen by Tommye Standard on 06/10/2022. Dose is appropriate based on dosing criteria. Will send in refill to requested pharmacy.

## 2022-07-21 NOTE — Telephone Encounter (Signed)
Please review for refill. Thank you! 

## 2022-07-22 ENCOUNTER — Encounter (HOSPITAL_BASED_OUTPATIENT_CLINIC_OR_DEPARTMENT_OTHER): Payer: Self-pay

## 2022-07-22 NOTE — Telephone Encounter (Signed)
Encounter opened in error

## 2022-07-22 NOTE — Telephone Encounter (Signed)
Received documentation that patient assistance was approved, Patient's daughter notified.

## 2022-08-05 DIAGNOSIS — I5022 Chronic systolic (congestive) heart failure: Secondary | ICD-10-CM | POA: Diagnosis not present

## 2022-08-05 DIAGNOSIS — I10 Essential (primary) hypertension: Secondary | ICD-10-CM | POA: Diagnosis not present

## 2022-08-05 DIAGNOSIS — E7800 Pure hypercholesterolemia, unspecified: Secondary | ICD-10-CM | POA: Diagnosis not present

## 2022-08-13 DIAGNOSIS — R7303 Prediabetes: Secondary | ICD-10-CM | POA: Diagnosis not present

## 2022-08-13 DIAGNOSIS — E039 Hypothyroidism, unspecified: Secondary | ICD-10-CM | POA: Diagnosis not present

## 2022-08-13 DIAGNOSIS — I10 Essential (primary) hypertension: Secondary | ICD-10-CM | POA: Diagnosis not present

## 2022-08-13 DIAGNOSIS — E871 Hypo-osmolality and hyponatremia: Secondary | ICD-10-CM | POA: Diagnosis not present

## 2022-08-20 DIAGNOSIS — D692 Other nonthrombocytopenic purpura: Secondary | ICD-10-CM | POA: Diagnosis not present

## 2022-08-20 DIAGNOSIS — N390 Urinary tract infection, site not specified: Secondary | ICD-10-CM | POA: Diagnosis not present

## 2022-08-20 DIAGNOSIS — R7303 Prediabetes: Secondary | ICD-10-CM | POA: Diagnosis not present

## 2022-08-20 DIAGNOSIS — R197 Diarrhea, unspecified: Secondary | ICD-10-CM | POA: Diagnosis not present

## 2022-08-20 DIAGNOSIS — E039 Hypothyroidism, unspecified: Secondary | ICD-10-CM | POA: Diagnosis not present

## 2022-08-20 DIAGNOSIS — N1832 Chronic kidney disease, stage 3b: Secondary | ICD-10-CM | POA: Diagnosis not present

## 2022-08-20 DIAGNOSIS — E782 Mixed hyperlipidemia: Secondary | ICD-10-CM | POA: Diagnosis not present

## 2022-08-20 DIAGNOSIS — Z0000 Encounter for general adult medical examination without abnormal findings: Secondary | ICD-10-CM | POA: Diagnosis not present

## 2022-08-20 DIAGNOSIS — D6869 Other thrombophilia: Secondary | ICD-10-CM | POA: Diagnosis not present

## 2022-08-20 DIAGNOSIS — I4821 Permanent atrial fibrillation: Secondary | ICD-10-CM | POA: Diagnosis not present

## 2022-08-20 DIAGNOSIS — I5022 Chronic systolic (congestive) heart failure: Secondary | ICD-10-CM | POA: Diagnosis not present

## 2022-08-20 DIAGNOSIS — I10 Essential (primary) hypertension: Secondary | ICD-10-CM | POA: Diagnosis not present

## 2022-08-26 ENCOUNTER — Ambulatory Visit (INDEPENDENT_AMBULATORY_CARE_PROVIDER_SITE_OTHER): Payer: Medicare HMO | Admitting: Cardiovascular Disease

## 2022-08-26 ENCOUNTER — Encounter (HOSPITAL_BASED_OUTPATIENT_CLINIC_OR_DEPARTMENT_OTHER): Payer: Self-pay | Admitting: Cardiovascular Disease

## 2022-08-26 VITALS — BP 110/58 | HR 61 | Ht 66.0 in | Wt 196.6 lb

## 2022-08-26 DIAGNOSIS — I4819 Other persistent atrial fibrillation: Secondary | ICD-10-CM | POA: Diagnosis not present

## 2022-08-26 DIAGNOSIS — I10 Essential (primary) hypertension: Secondary | ICD-10-CM | POA: Diagnosis not present

## 2022-08-26 DIAGNOSIS — I5042 Chronic combined systolic (congestive) and diastolic (congestive) heart failure: Secondary | ICD-10-CM

## 2022-08-26 HISTORY — DX: Chronic combined systolic (congestive) and diastolic (congestive) heart failure: I50.42

## 2022-08-26 NOTE — Assessment & Plan Note (Signed)
LVEF was 45%.  She has been euvolemic.  BP is controlled.  Continue metoprolol, spironolactone and Entresto.  Continue lasix.  Repeat echo.

## 2022-08-26 NOTE — Assessment & Plan Note (Signed)
She is maintaining sinus rhythm on amiodarone.  Continue amiodarone, metoprolol and Eliquis.

## 2022-08-26 NOTE — Patient Instructions (Signed)
Medication Instructions:  Your physician recommends that you continue on your current medications as directed. Please refer to the Current Medication list given to you today.   *If you need a refill on your cardiac medications before your next appointment, please call your pharmacy*  Lab Work: NONE  Testing/Procedures: Your physician has requested that you have an echocardiogram. Echocardiography is a painless test that uses sound waves to create images of your heart. It provides your doctor with information about the size and shape of your heart and how well your heart's chambers and valves are working. This procedure takes approximately one hour. There are no restrictions for this procedure. Please do NOT wear cologne, perfume, aftershave, or lotions (deodorant is allowed). Please arrive 15 minutes prior to your appointment time.   Follow-Up: At [facility], you and your health needs are our priority.  As part of our continuing mission to provide you with exceptional heart care, we have created designated Provider Care Teams.  These Care Teams include your primary Cardiologist (physician) and Advanced Practice Providers (APPs -  Physician Assistants and Nurse Practitioners) who all work together to provide you with the care you need, when you need it.  We recommend signing up for the patient portal called "MyChart".  Sign up information is provided on this After Visit Summary.  MyChart is used to connect with patients for Virtual Visits (Telemedicine).  Patients are able to view lab/test results, encounter notes, upcoming appointments, etc.  Non-urgent messages can be sent to your provider as well.   To learn more about what you can do with MyChart, go to [email or web address].    Your next appointment:   6 month(s)  The format for your next appointment:   In Person  Provider:   [name], MD

## 2022-08-26 NOTE — Progress Notes (Signed)
Cardiology Office Note:    Date:  08/26/2022   ID:  [name], DOB 04/08/1931, MRN [id number]  PCP:  Merrilee Seashore, MD   [facility] Providers Cardiologist:  [name], MD     Referring MD: Merrilee Seashore, MD   No chief complaint on file.   History of Present Illness:    [name] is a 88 y.o. female with a hx of hypertension, hyperlipidemia, paroxysmal atrial fibrillation, chronic systolic and diastolic heart failure, and pacemaker here for follow up. She was seen in the ED 12/08/2021 for onset A-fib. She presented with shortness of breath and volume overload. BNP was elevated to 453. Echo showed LVEF 45% with moderate mitral regurgitation.  She diuresed 12L. She underwent TEE/DCCV. She went back into atrial fibrillation and they planned for rate control. In the hospital, she was hyponatremic, fluoxetine was discontinued and her synthroid dose was adjusted. She was seen in clinic 01/2022 and lasix was increased. She underwent cardioversion again 05/2022 but returned later that day with bradycardia. She had a medtronic dual chamber pacemaker and was stable at follow up 06/2022.  Today, she is accompanied by her daughter.  Her daughter reports that she has chronic persistent coughing spells that began even before her cardioversion that has not improved. She usually has dry coughs but as of recently has been having productive coughs as well. She has taken over the counter medications like mucinex, but the cough has not resolved. She stays active mainly by going out to the store. She will be spending Thanksgiving with family. Her husband of 80 years recently passed away in 18-Mar-2023 and has been experiencing some anxiety attributed to her grief. She denies any palpitations, chest pain, shortness of breath, or peripheral edema. No headaches, syncope, orthopnea, or PND.  Past Medical History:  Diagnosis Date   Chronic combined systolic and diastolic heart failure ([place])  08/26/2022   Dyspnea    diastolic dysfunction by echo 2012   GERD (gastroesophageal reflux disease)    Hypertension    Mild mitral and aortic regurgitation    Osteoarthritis    Right bundle branch block 10/06/2012   Thyroid disease     Past Surgical History:  Procedure Laterality Date   arm surgery     following an accident   CARDIOVERSION N/A 12/11/2021   Procedure: CARDIOVERSION;  Surgeon: Werner Lean, MD;  Location: [facility] ENDOSCOPY;  Service: Cardiovascular;  Laterality: N/A;   CARDIOVERSION N/A 05/22/2022   Procedure: CARDIOVERSION;  Surgeon: [name], MD;  Location: [place];  Service: Cardiovascular;  Laterality: N/A;   KNEE SURGERY     Following an accident   PACEMAKER IMPLANT N/A 05/27/2022   Procedure: PACEMAKER IMPLANT;  Surgeon: Constance Haw, MD;  Location: [facility];  Service: Cardiovascular;  Laterality: N/A;   TEE WITHOUT CARDIOVERSION N/A 12/11/2021   Procedure: TRANSESOPHAGEAL ECHOCARDIOGRAM (TEE);  Surgeon: Werner Lean, MD;  [facility];  Service: Cardiovascular;  Laterality: N/A;   TONSILLECTOMY AND ADENOIDECTOMY     VAGINAL HYSTERECTOMY      Current Medications: Current Meds  Medication Sig   acetaminophen (TYLENOL) 500 MG tablet Take 1,000 mg by mouth every 6 (six) hours as needed for mild pain.   albuterol (VENTOLIN HFA) 108 (90 Base) MCG/ACT inhaler Inhale 1 puff into the lungs every 6 (six) hours as needed for wheezing or shortness of breath.   amiodarone (PACERONE) 200 MG tablet Take 1 tablet (200 mg total) by mouth daily.  apixaban (ELIQUIS) 5 MG TABS tablet Take 1 tablet by mouth twice daily   benzonatate (TESSALON PERLES) 100 MG capsule Take 1 capsule (100 mg total) by mouth 3 (three) times daily as needed for cough.   carboxymethylcellulose (REFRESH PLUS) 0.5 % SOLN Place 1 drop into both eyes daily as needed (dry eyes).   Cholecalciferol (VITAMIN D) 50 MCG (2000 UT) CAPS Take 2,000 Units by  mouth daily.   CIPRO 250 MG tablet Take 250 mg by mouth 2 (two) times daily. For 7 days   cyanocobalamin (VITAMIN B12) 1000 MCG tablet Take 2,000 mcg by mouth daily.   furosemide (LASIX) 20 MG tablet Take '40mg'$  (2 tablets) daily. May take additional '20mg'$  (1 tablet) as needed for weight gain of 2 pounds overnight or 5 pounds in one week   hydrocortisone cream 1 % Apply 1 Application topically 2 (two) times daily as needed for itching.   ketoconazole (NIZORAL) 2 % cream Apply 1 Application topically 2 (two) times daily as needed for rash.   loratadine (CLARITIN) 10 MG tablet Take 10 mg by mouth daily as needed for allergies.   LORazepam (ATIVAN) 0.5 MG tablet Take 0.5 mg by mouth 2 (two) times daily.   lovastatin (MEVACOR) 40 MG tablet Take 40 mg by mouth every evening.   metoprolol succinate (TOPROL-XL) 25 MG 24 hr tablet Take 0.5 tablets (12.5 mg total) by mouth daily.   omeprazole (PRILOSEC) 10 MG capsule Take 10 mg by mouth every evening.   potassium chloride SA (KLOR-CON M) 20 MEQ tablet Take 1 tablet (20 mEq total) by mouth daily.   sacubitril-valsartan (ENTRESTO) 24-26 MG Take 1 tablet by mouth 2 (two) times daily.   spironolactone (ALDACTONE) 25 MG tablet Take 0.5 tablets (12.5 mg total) by mouth daily.   thiamine (VITAMIN B-1) 50 MG tablet Take 25 mg by mouth daily.     Allergies:   Fluoxetine, Keflex [cephalexin], and Serotonin reuptake inhibitors (ssris)   Social History   Socioeconomic History   Marital status: Widowed    Spouse name: Elmira Olkowski   Number of children: 1   Years of education: Not on file   Highest education level: High school graduate  Occupational History   Occupation: retired  Tobacco Use   Smoking status: Never   Smokeless tobacco: Never  Vaping Use   Vaping Use: Never used  Substance and Sexual Activity   Alcohol use: No   Drug use: No   Sexual activity: Not on file  Other Topics Concern   Not on file  Social History Narrative   Not on file    Social Determinants of Health   Financial Resource Strain: Low Risk  (12/16/2021)   Overall Financial Resource Strain (CARDIA)    Difficulty of Paying Living Expenses: Not hard at all  Food Insecurity: No Food Insecurity (12/16/2021)   Hunger Vital Sign    Worried About Running Out of Food in the Last Year: Never true    [place] in the Last Year: Never true  Transportation Needs: No Transportation Needs (12/16/2021)   PRAPARE - Hydrologist (Medical): No    Lack of Transportation (Non-Medical): No  Physical Activity: Not on file  Stress: Not on file  Social Connections: Not on file     Family History: The patient's family history includes Heart attack in her mother.  ROS:   Please see the history of present illness.   (+) Dizziness (+) Chronic persistent Cough (  dry but sometimes productive) (+) Anxiety All other systems reviewed and are negative.  EKGs/Labs/Other Studies Reviewed:    The following studies were reviewed today:  ECHO 12/09/2021: 1. Left ventricular ejection fraction, by estimation, is 45%. The left  ventricle has mildly decreased function. The left ventricle demonstrates global hypokinesis. Left ventricular diastolic parameters are indeterminate.   2. Right ventricular systolic function is mildly reduced. The right  ventricular size is normal. There is mildly elevated pulmonary artery systolic pressure. The estimated right ventricular systolic pressure is 60.4 mmHg.   3. Left atrial size was mildly dilated.   4. The mitral valve is abnormal. Moderate mitral valve regurgitation. No evidence of mitral stenosis.   5. The aortic valve is tricuspid. There is moderate calcification of the aortic valve. Aortic valve regurgitation is mild. Aortic valve  sclerosis/calcification is present, without any evidence of aortic  stenosis.   6. Aortic dilatation noted. There is mild dilatation of the ascending  aorta, measuring 40 mm.   7.  The inferior vena cava is dilated in size with <50% respiratory  variability, suggesting right atrial pressure of 15 mmHg.   8. There was a left pleural effusion.   9. The patient was in atrial fibrillation.   ECHO TEE 12/11/2021: 1. Left ventricular ejection fraction, by estimation, is 45 to 50%. Left ventricular ejection fraction by 3D volume is 46 %. The left ventricle has mildly decreased function. The left ventricle demonstrates global hypokinesis. Left ventricular diastolic   function could not be evaluated.   2. Right ventricular systolic function is normal. The right ventricular size is normal.   3. Left atrial size was mildly dilated. No left atrial/left atrial  appendage thrombus was detected.   4. Large pleural effusion in the left lateral region.   5. The mitral valve is grossly normal. Moderate mitral valve  regurgitation. No evidence of mitral stenosis.   6. Tricuspid valve regurgitation is mild to moderate.   7. The aortic valve is tricuspid. Aortic valve regurgitation is mild to  moderate. No aortic stenosis is present. Aortic valve mean gradient  measures 3.0 mmHg.   8. There is mild (Grade II) plaque involving the descending aorta.   EKG:  EKG is personally reviewed. 08/26/2022: Atrial paced. Rate 61 bpm. RBBB 06/06/2022: Atrial paced rhythm 61 bm with RBBB.   EKG is  ordered today.  The ekg ordered today demonstrates a-fib rate- 64 bpm RBBB  Recent Labs: 05/22/2022: B Natriuretic Peptide 335.8; Magnesium 2.0 05/23/2022: TSH 13.968 05/24/2022: ALT 25 05/28/2022: Hemoglobin 10.4; Platelets 192 06/20/2022: BUN 20; Creatinine, Ser 1.34; Potassium 4.9; Sodium 137  Recent Lipid Panel No results found for: "CHOL", "TRIG", "HDL", "CHOLHDL", "VLDL", "LDLCALC", "LDLDIRECT"   Risk Assessment/Calculations:    CHA2DS2-VASc Score = 5   This indicates a 7.2% annual risk of stroke. The patient's score is based upon: CHF History: 1 HTN History: 1 Diabetes History: 0 Stroke  History: 0 Vascular Disease History: 0 Age Score: 2 Gender Score: 1          Physical Exam:    VS:  BP (!) 110/58 (BP Location: Right Arm, Patient Position: Sitting, Cuff Size: Large)   Pulse 61   Ht '5\' 6"'$  (1.676 m)   Wt 196 lb 9.6 oz (89.2 kg)   BMI 31.73 kg/m  , BMI Body mass index is 31.73 kg/m. GENERAL:  Well appearing HEENT: Pupils equal round and reactive, fundi not visualized, oral mucosa unremarkable NECK:  No jugular venous distention, waveform within  normal limits, carotid upstroke brisk and symmetric, no bruits, no thyromegaly LUNGS:  Clear to auscultation bilaterally HEART:  RRR.  PMI not displaced or sustained,S1 and S2 within normal limits, no S3, no S4, no clicks, no rubs, no murmurs ABD:  Flat, positive bowel sounds normal in frequency in pitch, no bruits, no rebound, no guarding, no midline pulsatile mass, no hepatomegaly, no splenomegaly EXT:  2 plus pulses throughout, no edema, no cyanosis no clubbing SKIN:  No rashes no nodules NEURO:  Cranial nerves II through XII grossly intact, motor grossly intact throughout PSYCH:  Cognitively intact, oriented to person place and time   ASSESSMENT:    1. Chronic combined systolic and diastolic heart failure (HCC)   2. Persistent atrial fibrillation ([place])   3. Essential hypertension     PLAN:    In order of problems listed above: Chronic combined systolic and diastolic heart failure (HCC) LVEF was 45%.  She has been euvolemic.  BP is controlled.  Continue metoprolol, spironolactone and Entresto.  Continue lasix.  Repeat echo.    Persistent atrial fibrillation ([place]) She is maintaining sinus rhythm on amiodarone.  Continue amiodarone, metoprolol and Eliquis.   Essential hypertension Blood pressure is well-controlled on metoprolol, Entresto and spironolactone.     Follow-up with Mateus Rewerts C. Oval Linsey, MD, [facility] in 6 months  Medication Adjustments/Labs and Tests Ordered: Current medicines are reviewed at length  with the patient today.  Concerns regarding medicines are outlined above.  Orders Placed This Encounter  Procedures   EKG 12-Lead   ECHOCARDIOGRAM COMPLETE   No orders of the defined types were placed in this encounter.   Patient Instructions  Medication Instructions:  Your physician recommends that you continue on your current medications as directed. Please refer to the Current Medication list given to you today.   *If you need a refill on your cardiac medications before your next appointment, please call your pharmacy*  Lab Work: NONE  Testing/Procedures: Your physician has requested that you have an echocardiogram. Echocardiography is a painless test that uses sound waves to create images of your heart. It provides your doctor with information about the size and shape of your heart and how well your heart's chambers and valves are working. This procedure takes approximately one hour. There are no restrictions for this procedure. Please do NOT wear cologne, perfume, aftershave, or lotions (deodorant is allowed). Please arrive 15 minutes prior to your appointment time.   Follow-Up: At [facility], you and your health needs are our priority.  As part of our continuing mission to provide you with exceptional heart care, we have created designated Provider Care Teams.  These Care Teams include your primary Cardiologist (physician) and Advanced Practice Providers (APPs -  Physician Assistants and Nurse Practitioners) who all work together to provide you with the care you need, when you need it.  We recommend signing up for the patient portal called "MyChart".  Sign up information is provided on this After Visit Summary.  MyChart is used to connect with patients for Virtual Visits (Telemedicine).  Patients are able to view lab/test results, encounter notes, upcoming appointments, etc.  Non-urgent messages can be sent to your provider as well.   To learn more about what you can do  with MyChart, go to [email or web address].    Your next appointment:   6 month(s)  The format for your next appointment:   In Person  Provider:   [name], MD        Cannon Kettle  Rivera,acting as a Education administrator for [name], MD.,have documented all relevant documentation on the behalf of [name], MD,as directed by  [name], MD while in the presence of [name], MD.  I, Zumbrota Oval Linsey, MD have reviewed all documentation for this visit.  The documentation of the exam, diagnosis, procedures, and orders on 08/26/2022 are all accurate and complete.   Signed, [name], MD  08/26/2022 1:11 PM    [place]

## 2022-08-26 NOTE — Assessment & Plan Note (Signed)
Blood pressure is well-controlled on metoprolol, Entresto and spironolactone.

## 2022-09-01 ENCOUNTER — Ambulatory Visit (INDEPENDENT_AMBULATORY_CARE_PROVIDER_SITE_OTHER): Payer: Medicare HMO

## 2022-09-01 DIAGNOSIS — I5042 Chronic combined systolic (congestive) and diastolic (congestive) heart failure: Secondary | ICD-10-CM

## 2022-09-02 LAB — CUP PACEART REMOTE DEVICE CHECK
Battery Remaining Longevity: 113 mo
Battery Voltage: 3.18 V
Brady Statistic AP VP Percent: 0.05 %
Brady Statistic AP VS Percent: 99.85 %
Brady Statistic AS VP Percent: 0 %
Brady Statistic AS VS Percent: 0.1 %
Brady Statistic RA Percent Paced: 99.89 %
Brady Statistic RV Percent Paced: 0.05 %
Date Time Interrogation Session: 20231126201432
Implantable Lead Connection Status: 753985
Implantable Lead Connection Status: 753985
Implantable Lead Implant Date: 20230822
Implantable Lead Implant Date: 20230822
Implantable Lead Location: 753859
Implantable Lead Location: 753860
Implantable Lead Model: 3830
Implantable Lead Model: 5076
Implantable Pulse Generator Implant Date: 20230822
Lead Channel Impedance Value: 361 Ohm
Lead Channel Impedance Value: 456 Ohm
Lead Channel Impedance Value: 456 Ohm
Lead Channel Impedance Value: 798 Ohm
Lead Channel Pacing Threshold Amplitude: 1.25 V
Lead Channel Pacing Threshold Amplitude: 1.875 V
Lead Channel Pacing Threshold Pulse Width: 0.4 ms
Lead Channel Pacing Threshold Pulse Width: 0.4 ms
Lead Channel Sensing Intrinsic Amplitude: 19.5 mV
Lead Channel Sensing Intrinsic Amplitude: 19.5 mV
Lead Channel Sensing Intrinsic Amplitude: 3.125 mV
Lead Channel Sensing Intrinsic Amplitude: 3.125 mV
Lead Channel Setting Pacing Amplitude: 2.5 V
Lead Channel Setting Pacing Amplitude: 3.5 V
Lead Channel Setting Pacing Pulse Width: 0.4 ms
Lead Channel Setting Sensing Sensitivity: 0.9 mV
Zone Setting Status: 755011

## 2022-09-03 ENCOUNTER — Encounter: Payer: Medicare HMO | Admitting: Cardiology

## 2022-09-11 DIAGNOSIS — R69 Illness, unspecified: Secondary | ICD-10-CM | POA: Diagnosis not present

## 2022-09-11 DIAGNOSIS — F419 Anxiety disorder, unspecified: Secondary | ICD-10-CM | POA: Diagnosis not present

## 2022-09-11 DIAGNOSIS — R059 Cough, unspecified: Secondary | ICD-10-CM | POA: Diagnosis not present

## 2022-09-25 ENCOUNTER — Ambulatory Visit: Payer: Medicare HMO | Attending: Cardiology | Admitting: Cardiology

## 2022-09-25 ENCOUNTER — Encounter: Payer: Self-pay | Admitting: Cardiology

## 2022-09-25 ENCOUNTER — Telehealth (HOSPITAL_BASED_OUTPATIENT_CLINIC_OR_DEPARTMENT_OTHER): Payer: Self-pay | Admitting: *Deleted

## 2022-09-25 VITALS — BP 110/60 | HR 77 | Ht 66.0 in | Wt 195.2 lb

## 2022-09-25 DIAGNOSIS — R053 Chronic cough: Secondary | ICD-10-CM | POA: Diagnosis not present

## 2022-09-25 DIAGNOSIS — I10 Essential (primary) hypertension: Secondary | ICD-10-CM

## 2022-09-25 DIAGNOSIS — I495 Sick sinus syndrome: Secondary | ICD-10-CM

## 2022-09-25 DIAGNOSIS — D6869 Other thrombophilia: Secondary | ICD-10-CM | POA: Diagnosis not present

## 2022-09-25 DIAGNOSIS — I4891 Unspecified atrial fibrillation: Secondary | ICD-10-CM

## 2022-09-25 DIAGNOSIS — I4819 Other persistent atrial fibrillation: Secondary | ICD-10-CM

## 2022-09-25 DIAGNOSIS — Z79899 Other long term (current) drug therapy: Secondary | ICD-10-CM

## 2022-09-25 MED ORDER — ENTRESTO 24-26 MG PO TABS: 1.0000 | ORAL_TABLET | Freq: Two times a day (BID) | ORAL | 3 refills | Status: DC

## 2022-09-25 MED ORDER — APIXABAN 5 MG PO TABS: 5.0000 mg | ORAL_TABLET | Freq: Two times a day (BID) | ORAL | 1 refills | Status: DC

## 2022-09-25 NOTE — Progress Notes (Signed)
Electrophysiology Office Note   Date:  09/25/2022   ID:  [name], DOB [birth year], MRN [id number]  PCP:  Merrilee Seashore, MD  Cardiologist:  Oval Linsey Primary Electrophysiologist:  Shaniah Baltes Meredith Leeds, MD    Chief Complaint: AF   History of Present Illness: [name] is a 88 y.o. female who is being seen today for the evaluation of AF at the request of Merrilee Seashore, MD. Presenting today for electrophysiology evaluation.  She has a history significant for right bundle branch block, hypertension, hyperlipidemia, atrial fibrillation, chronic systolic heart failure.  She was mid to the hospital 05/22/2022 for symptomatic bradycardia.  She had had a cardioversion earlier in the day.  She underwent Medtronic dual-chamber pacemaker implant 05/27/2022.  Today, she denies symptoms of palpitations, chest pain, shortness of breath, orthopnea, PND, lower extremity edema, claudication, dizziness, presyncope, syncope, bleeding, or neurologic sequela. The patient is tolerating medications without difficulties.  Complaint today is chronic cough.  She has been having a cough for the last few months.  She initially had an appointment with pulmonology, though this was canceled due to acute illness.  She has had coughing spells where she vomits at times.  She does cough on a daily basis.  Both her and her family have tried multiple medications including omeprazole, Benadryl, Delsym.   Past Medical History:  Diagnosis Date   Chronic combined systolic and diastolic heart failure ([place]) 08/26/2022   Dyspnea    diastolic dysfunction by echo 2012   GERD (gastroesophageal reflux disease)    Hypertension    Mild mitral and aortic regurgitation    Osteoarthritis    Right bundle branch block 10/06/2012   Thyroid disease    Past Surgical History:  Procedure Laterality Date   arm surgery     following an accident   CARDIOVERSION N/A 12/11/2021   Procedure: CARDIOVERSION;  Surgeon:  Werner Lean, MD;  Location: [facility] ENDOSCOPY;  Service: Cardiovascular;  Laterality: N/A;   CARDIOVERSION N/A 05/22/2022   Procedure: CARDIOVERSION;  Surgeon: [name], MD;  Location: [facility];  Service: Cardiovascular;  Laterality: N/A;   KNEE SURGERY     Following an accident   PACEMAKER IMPLANT N/A 05/27/2022   Procedure: PACEMAKER IMPLANT;  Surgeon: Constance Haw, MD;  Location: [facility] CV LAB;  Service: Cardiovascular;  Laterality: N/A;   TEE WITHOUT CARDIOVERSION N/A 12/11/2021   Procedure: TRANSESOPHAGEAL ECHOCARDIOGRAM (TEE);  Surgeon: Werner Lean, MD;  Location: [facility] ENDOSCOPY;  Service: Cardiovascular;  Laterality: N/A;   TONSILLECTOMY AND ADENOIDECTOMY     VAGINAL HYSTERECTOMY       Current Outpatient Medications  Medication Sig Dispense Refill   acetaminophen (TYLENOL) 500 MG tablet Take 1,000 mg by mouth every 6 (six) hours as needed for mild pain.     albuterol (VENTOLIN HFA) 108 (90 Base) MCG/ACT inhaler Inhale 1 puff into the lungs every 6 (six) hours as needed for wheezing or shortness of breath.     amiodarone (PACERONE) 200 MG tablet Take 1 tablet (200 mg total) by mouth daily. 90 tablet 2   apixaban (ELIQUIS) 5 MG TABS tablet Take 1 tablet by mouth twice daily 180 tablet 1   benzonatate (TESSALON PERLES) 100 MG capsule Take 1 capsule (100 mg total) by mouth 3 (three) times daily as needed for cough. 20 capsule 0   carboxymethylcellulose (REFRESH PLUS) 0.5 % SOLN Place 1 drop into both eyes daily as needed (dry eyes).     Cholecalciferol (VITAMIN D) 50 MCG (  2000 UT) CAPS Take 2,000 Units by mouth daily.     cyanocobalamin (VITAMIN B12) 1000 MCG tablet Take 2,000 mcg by mouth daily.     furosemide (LASIX) 20 MG tablet Take '40mg'$  (2 tablets) daily. May take additional '20mg'$  (1 tablet) as needed for weight gain of 2 pounds overnight or 5 pounds in one week 90 tablet 3   ipratropium (ATROVENT) 0.06 % nasal spray 2 sprays in each nostril  Nasally Three times a day for 30 days     ketoconazole (NIZORAL) 2 % cream Apply 1 Application topically 2 (two) times daily as needed for rash.     levothyroxine (SYNTHROID) 75 MCG tablet Take 3 tablets (225 mcg total) by mouth daily at 6 (six) AM. 90 tablet 0   loratadine (CLARITIN) 10 MG tablet Take 10 mg by mouth daily as needed for allergies.     LORazepam (ATIVAN) 0.5 MG tablet Take 0.5 mg by mouth 2 (two) times daily.     lovastatin (MEVACOR) 40 MG tablet Take 40 mg by mouth every evening.     metoprolol succinate (TOPROL-XL) 25 MG 24 hr tablet Take 0.5 tablets (12.5 mg total) by mouth daily. 15 tablet 3   Multiple Vitamins-Minerals (EYE VITAMINS) CAPS as directed Orally     omeprazole (PRILOSEC) 10 MG capsule Take 10 mg by mouth every evening.     potassium chloride SA (KLOR-CON M) 20 MEQ tablet Take 1 tablet (20 mEq total) by mouth daily. 90 tablet 3   sacubitril-valsartan (ENTRESTO) 24-26 MG Take 1 tablet by mouth 2 (two) times daily. 180 tablet 3   spironolactone (ALDACTONE) 25 MG tablet Take 0.5 tablets (12.5 mg total) by mouth daily. 45 tablet 3   thiamine (VITAMIN B-1) 50 MG tablet Take 25 mg by mouth daily.     No current facility-administered medications for this visit.    Allergies:   Fluoxetine, Keflex [cephalexin], and Serotonin reuptake inhibitors (ssris)   Social History:  The patient  reports that she has never smoked. She has never used smokeless tobacco. She reports that she does not drink alcohol and does not use drugs.   Family History:  The patient's family history includes Heart attack in her mother.    ROS:  Please see the history of present illness.   Otherwise, review of systems is positive for none.   All other systems are reviewed and negative.    PHYSICAL EXAM: VS:  BP 110/60   Pulse 77   Ht '5\' 6"'$  (1.676 m)   Wt 195 lb 3.2 oz (88.5 kg)   SpO2 97%   BMI 31.51 kg/m  , BMI Body mass index is 31.51 kg/m. GEN: Well nourished, well developed, in no  acute distress  HEENT: normal  Neck: no JVD, carotid bruits, or masses Cardiac: RRR; no murmurs, rubs, or gallops,no edema  Respiratory:  clear to auscultation bilaterally, normal work of breathing GI: soft, nontender, nondistended, + BS MS: no deformity or atrophy  Skin: warm and dry, device pocket is well healed Neuro:  Strength and sensation are intact Psych: euthymic mood, full affect  EKG:  EKG is ordered today. Personal review of the ekg ordered shows A paced, RBBB  Device interrogation is reviewed today in detail.  See PaceArt for details.   Recent Labs: 05/22/2022: B Natriuretic Peptide 335.8; Magnesium 2.0 05/23/2022: TSH 13.968 05/24/2022: ALT 25 05/28/2022: Hemoglobin 10.4; Platelets 192 06/20/2022: BUN 20; Creatinine, Ser 1.34; Potassium 4.9; Sodium 137    Lipid Panel  No  results found for: "CHOL", "TRIG", "HDL", "CHOLHDL", "VLDL", "LDLCALC", "LDLDIRECT"   Wt Readings from Last 3 Encounters:  09/25/22 195 lb 3.2 oz (88.5 kg)  08/26/22 196 lb 9.6 oz (89.2 kg)  06/10/22 196 lb (88.9 kg)      Other studies Reviewed: Additional studies/ records that were reviewed today include: TTE 12/09/21  Review of the above records today demonstrates:   1. Left ventricular ejection fraction, by estimation, is 45%. The left  ventricle has mildly decreased function. The left ventricle demonstrates  global hypokinesis. Left ventricular diastolic parameters are  indeterminate.   2. Right ventricular systolic function is mildly reduced. The right  ventricular size is normal. There is mildly elevated pulmonary artery  systolic pressure. The estimated right ventricular systolic pressure is  87.6 mmHg.   3. Left atrial size was mildly dilated.   4. The mitral valve is abnormal. Moderate mitral valve regurgitation. No  evidence of mitral stenosis.   5. The aortic valve is tricuspid. There is moderate calcification of the  aortic valve. Aortic valve regurgitation is mild. Aortic valve   sclerosis/calcification is present, without any evidence of aortic  stenosis.   6. Aortic dilatation noted. There is mild dilatation of the ascending  aorta, measuring 40 mm.   7. The inferior vena cava is dilated in size with <50% respiratory  variability, suggesting right atrial pressure of 15 mmHg.   8. There was a left pleural effusion.   9. The patient was in atrial fibrillation.    ASSESSMENT AND PLAN:  1.  Tachybradycardia syndrome: Status post Medtronic dual-chamber pacemaker implanted 05/27/2022.  Device functioning appropriately.  No changes at this time.  2.  Persistent atrial fibrillation: Currently on Eliquis 5 mg twice daily, amiodarone 200 mg daily.  CHA2DS2-VASc of at least 5.  3.  Chronic combined systolic and diastolic heart failure: No obvious volume overload.  On optimal medical therapy per primary cardiology.  4.  Hypertension: Currently well-controlled  5.  Chronic cough: Patient has been dealing with a chronic cough for the last few months.  Both her and her family are quite frustrated.  They are asking for referral to pulmonology.  6.  High risk medication monitoring: Currently on amiodarone for atrial fibrillation as above.  Labs have remained stable.  Current medicines are reviewed at length with the patient today.   The patient does not have concerns regarding her medicines.  The following changes were made today:  none  Labs/ tests ordered today include:  Orders Placed This Encounter  Procedures   EKG 12-Lead     Disposition:   FU with Tache Bobst 6 months  Signed, Landrey Mahurin Meredith Leeds, MD  09/25/2022 12:33 PM     [place] [address] [phone] (office) [phone] (fax)

## 2022-09-25 NOTE — Telephone Encounter (Signed)
Patient assistance for Entresto and Eliquis filled out and waiting for Dr Oval Linsey to sign

## 2022-09-25 NOTE — Patient Instructions (Addendum)
Medication Instructions:  Your physician recommends that you continue on your current medications as directed. Please refer to the Current Medication list given to you today.  *If you need a refill on your cardiac medications before your next appointment, please call your pharmacy*   Lab Work: None ordered If you have labs (blood work) drawn today and your tests are completely normal, you will receive your results only by: [place] (if you have MyChart) OR A paper copy in the mail If you have any lab test that is abnormal or we need to change your treatment, we will call you to review the results.   Testing/Procedures: None ordered   Follow-Up: At [facility], you and your health needs are our priority.  As part of our continuing mission to provide you with exceptional heart care, we have created designated Provider Care Teams.  These Care Teams include your primary Cardiologist (physician) and Advanced Practice Providers (APPs -  Physician Assistants and Nurse Practitioners) who all work together to provide you with the care you need, when you need it.   You have been referred to pulmonology   Your next appointment:   6 month(s)  The format for your next appointment:   In Person  Provider:   You will see one of the following Advanced Practice Providers on your designated Care Team:   Tommye Standard, [place] Legrand Como "Jonni Sanger" Chalmers Cater, [place]    Thank you for choosing [facility]!!   Trinidad Curet, RN [phone]  Other Instructions   Important Information About Sugar

## 2022-09-26 NOTE — Telephone Encounter (Signed)
Faxed and confirmation received.

## 2022-09-28 ENCOUNTER — Other Ambulatory Visit (HOSPITAL_BASED_OUTPATIENT_CLINIC_OR_DEPARTMENT_OTHER): Payer: Self-pay | Admitting: Cardiovascular Disease

## 2022-09-30 NOTE — Telephone Encounter (Signed)
Rx(s) sent to pharmacy electronically.  

## 2022-10-01 NOTE — Telephone Encounter (Signed)
Patient's daughter is following up. She states she spoke with [employer] and they have not received the patient assistance application.

## 2022-10-02 ENCOUNTER — Other Ambulatory Visit (HOSPITAL_BASED_OUTPATIENT_CLINIC_OR_DEPARTMENT_OTHER): Payer: Self-pay

## 2022-10-02 MED ORDER — METOPROLOL SUCCINATE ER 25 MG PO TB24: 12.5000 mg | ORAL_TABLET | Freq: Every day | ORAL | 3 refills | Status: DC

## 2022-10-07 ENCOUNTER — Emergency Department (HOSPITAL_BASED_OUTPATIENT_CLINIC_OR_DEPARTMENT_OTHER): Payer: Medicare HMO

## 2022-10-07 ENCOUNTER — Emergency Department (HOSPITAL_BASED_OUTPATIENT_CLINIC_OR_DEPARTMENT_OTHER)
Admission: EM | Admit: 2022-10-07 | Discharge: 2022-10-07 | Disposition: A | Discharge status: Home / Self Care | Payer: Medicare HMO | Attending: Emergency Medicine | Admitting: Emergency Medicine

## 2022-10-07 ENCOUNTER — Other Ambulatory Visit: Payer: Self-pay

## 2022-10-07 ENCOUNTER — Encounter (HOSPITAL_BASED_OUTPATIENT_CLINIC_OR_DEPARTMENT_OTHER): Payer: Self-pay

## 2022-10-07 DIAGNOSIS — R1011 Right upper quadrant pain: Secondary | ICD-10-CM | POA: Diagnosis present

## 2022-10-07 DIAGNOSIS — Z7901 Long term (current) use of anticoagulants: Secondary | ICD-10-CM | POA: Insufficient documentation

## 2022-10-07 DIAGNOSIS — E875 Hyperkalemia: Secondary | ICD-10-CM | POA: Diagnosis not present

## 2022-10-07 DIAGNOSIS — Z20822 Contact with and (suspected) exposure to covid-19: Secondary | ICD-10-CM | POA: Diagnosis not present

## 2022-10-07 DIAGNOSIS — K420 Umbilical hernia with obstruction, without gangrene: Secondary | ICD-10-CM | POA: Diagnosis not present

## 2022-10-07 DIAGNOSIS — N281 Cyst of kidney, acquired: Secondary | ICD-10-CM | POA: Diagnosis not present

## 2022-10-07 DIAGNOSIS — K429 Umbilical hernia without obstruction or gangrene: Secondary | ICD-10-CM | POA: Diagnosis not present

## 2022-10-07 DIAGNOSIS — K5730 Diverticulosis of large intestine without perforation or abscess without bleeding: Secondary | ICD-10-CM | POA: Diagnosis not present

## 2022-10-07 LAB — URINALYSIS, ROUTINE W REFLEX MICROSCOPIC
Bilirubin Urine: NEGATIVE
Glucose, UA: NEGATIVE mg/dL
Hgb urine dipstick: NEGATIVE
Ketones, ur: NEGATIVE mg/dL
Leukocytes,Ua: NEGATIVE
Nitrite: NEGATIVE
Protein, ur: NEGATIVE mg/dL
Specific Gravity, Urine: 1.008 (ref 1.005–1.030)
pH: 5.5 (ref 5.0–8.0)

## 2022-10-07 LAB — COMPREHENSIVE METABOLIC PANEL
ALT: 8 U/L (ref 0–44)
AST: 15 U/L (ref 15–41)
Albumin: 4.1 g/dL (ref 3.5–5.0)
Alkaline Phosphatase: 95 U/L (ref 38–126)
Anion gap: 9 (ref 5–15)
BUN: 14 mg/dL (ref 8–23)
CO2: 26 mmol/L (ref 22–32)
Calcium: 9.6 mg/dL (ref 8.9–10.3)
Chloride: 98 mmol/L (ref 98–111)
Creatinine, Ser: 1.24 mg/dL — ABNORMAL HIGH (ref 0.44–1.00)
GFR, Estimated: 42 mL/min — ABNORMAL LOW (ref >60)
Glucose, Bld: 116 mg/dL — ABNORMAL HIGH (ref 70–99)
Potassium: 5.5 mmol/L — ABNORMAL HIGH (ref 3.5–5.1)
Sodium: 133 mmol/L — ABNORMAL LOW (ref 135–145)
Total Bilirubin: 0.4 mg/dL (ref 0.3–1.2)
Total Protein: 7.1 g/dL (ref 6.5–8.1)

## 2022-10-07 LAB — CBC
HCT: 39.6 % (ref 36.0–46.0)
Hemoglobin: 12.8 g/dL (ref 12.0–15.0)
MCH: 28.7 pg (ref 26.0–34.0)
MCHC: 32.3 g/dL (ref 30.0–36.0)
MCV: 88.8 fL (ref 80.0–100.0)
Platelets: 284 10*3/uL (ref 150–400)
RBC: 4.46 MIL/uL (ref 3.87–5.11)
RDW: 13 % (ref 11.5–15.5)
WBC: 5.9 10*3/uL (ref 4.0–10.5)
nRBC: 0 % (ref 0.0–0.2)

## 2022-10-07 LAB — RESP PANEL BY RT-PCR (RSV, FLU A&B, COVID)  RVPGX2
Influenza A by PCR: NEGATIVE
Influenza B by PCR: NEGATIVE
Resp Syncytial Virus by PCR: NEGATIVE
SARS Coronavirus 2 by RT PCR: NEGATIVE

## 2022-10-07 LAB — LACTIC ACID, PLASMA: Lactic Acid, Venous: 1.3 mmol/L (ref 0.5–1.9)

## 2022-10-07 LAB — LIPASE, BLOOD: Lipase: <10 U/L — ABNORMAL LOW (ref 11–51)

## 2022-10-07 MED ORDER — HYDROCODONE-ACETAMINOPHEN 5-325 MG PO TABS: 1.0000 | ORAL_TABLET | Freq: Four times a day (QID) | ORAL | 0 refills | Status: DC | PRN

## 2022-10-07 MED ORDER — ONDANSETRON HCL 4 MG/2ML IJ SOLN
4.0000 mg | Freq: Once | INTRAMUSCULAR | Status: AC
  Administered 2022-10-07: 4 mg via INTRAVENOUS
  Filled 2022-10-07: qty 2

## 2022-10-07 MED ORDER — POLYETHYLENE GLYCOL 3350 17 G PO PACK: 17.0000 g | PACK | Freq: Every day | ORAL | 0 refills | Status: DC

## 2022-10-07 MED ORDER — IOHEXOL 300 MG/ML  SOLN
100.0000 mL | Freq: Once | INTRAMUSCULAR | Status: AC | PRN
  Administered 2022-10-07: 75 mL via INTRAVENOUS

## 2022-10-07 MED ORDER — FENTANYL CITRATE PF 50 MCG/ML IJ SOSY
25.0000 ug | PREFILLED_SYRINGE | Freq: Once | INTRAMUSCULAR | Status: AC
  Administered 2022-10-07: 25 ug via INTRAVENOUS
  Filled 2022-10-07: qty 1

## 2022-10-07 MED ORDER — SODIUM ZIRCONIUM CYCLOSILICATE 10 G PO PACK
10.0000 g | PACK | Freq: Once | ORAL | Status: AC
  Administered 2022-10-07: 10 g via ORAL
  Filled 2022-10-07: qty 1

## 2022-10-07 MED ORDER — HYDROCODONE-ACETAMINOPHEN 5-325 MG PO TABS
1.0000 | ORAL_TABLET | Freq: Once | ORAL | Status: AC
  Administered 2022-10-07: 1 via ORAL
  Filled 2022-10-07: qty 1

## 2022-10-07 MED ORDER — SODIUM CHLORIDE 0.9 % IV BOLUS
500.0000 mL | Freq: Once | INTRAVENOUS | Status: AC
  Administered 2022-10-07: 500 mL via INTRAVENOUS

## 2022-10-07 MED ORDER — ONDANSETRON HCL 4 MG PO TABS: 4.0000 mg | ORAL_TABLET | Freq: Four times a day (QID) | ORAL | 0 refills | Status: DC

## 2022-10-07 NOTE — ED Provider Notes (Signed)
[facility] EMERGENCY DEPT Provider Note   CSN: [id number] Arrival date & time: 10/07/22  1200     History  Chief Complaint  Patient presents with   Abdominal Pain    [name] is a 89 y.o. female, history of appendectomy, A-fib, who presents to the ED secondary to abdominal pain that is been going on for the last week or so, with associated nausea, diarrhea.  States that she typically has loose stools about once a day, but has been having 3 episodes of diarrhea for the last week, daily.  She states that she feels just very unwell, and her whole abdomen hurts.  She states that she does not want to eat or drink secondary to this nausea.  Denies any fevers or chills.  Describes the abdominal pain as stabbing and dull at other times.  She states it is constant.  Denies any aggravating factors.  Reports that it is primarily in the right upper quadrant.     Home Medications Prior to Admission medications   Medication Sig Start Date End Date Taking? Authorizing Provider  HYDROcodone-acetaminophen (NORCO) 5-325 MG tablet Take 1 tablet by mouth every 6 (six) hours as needed for moderate pain. 10/07/22  Yes Jakevion Arney L, PA  ondansetron (ZOFRAN) 4 MG tablet Take 1 tablet (4 mg total) by mouth every 6 (six) hours. 10/07/22  Yes Mykira Hofmeister L, PA  polyethylene glycol (MIRALAX) 17 g packet Take 17 g by mouth daily. 10/07/22  Yes Amity Roes L, PA  acetaminophen (TYLENOL) 500 MG tablet Take 1,000 mg by mouth every 6 (six) hours as needed for mild pain.    [provider]  albuterol (VENTOLIN HFA) 108 (90 Base) MCG/ACT inhaler Inhale 1 puff into the lungs every 6 (six) hours as needed for wheezing or shortness of breath. 01/14/22   [provider]  amiodarone (PACERONE) 200 MG tablet Take 1 tablet (200 mg total) by mouth daily. 03/13/22   Loel Dubonnet, NP  apixaban (ELIQUIS) 5 MG TABS tablet Take 1 tablet (5 mg total) by mouth 2 (two) times daily. 09/25/22    [name], MD  benzonatate (TESSALON PERLES) 100 MG capsule Take 1 capsule (100 mg total) by mouth 3 (three) times daily as needed for cough. 01/14/22   Loel Dubonnet, NP  carboxymethylcellulose (REFRESH PLUS) 0.5 % SOLN Place 1 drop into both eyes daily as needed (dry eyes).    [provider]  Cholecalciferol (VITAMIN D) 50 MCG (2000 UT) CAPS Take 2,000 Units by mouth daily.    [provider]  cyanocobalamin (VITAMIN B12) 1000 MCG tablet Take 2,000 mcg by mouth daily.    [provider]  furosemide (LASIX) 20 MG tablet Take '40mg'$  (2 tablets) daily. May take additional '20mg'$  (1 tablet) as needed for weight gain of 2 pounds overnight or 5 pounds in one week 03/13/22   Loel Dubonnet, NP  ipratropium (ATROVENT) 0.06 % nasal spray 2 sprays in each nostril Nasally Three times a day for 30 days 09/11/22   [provider]  ketoconazole (NIZORAL) 2 % cream Apply 1 Application topically 2 (two) times daily as needed for rash. 03/04/22   [provider]  levothyroxine (SYNTHROID) 75 MCG tablet Take 3 tablets (225 mcg total) by mouth daily at 6 (six) AM. 05/29/22 09/25/22  Arrien, Jimmy Picket, MD  loratadine (CLARITIN) 10 MG tablet Take 10 mg by mouth daily as needed for allergies.    [provider]  LORazepam (  ATIVAN) 0.5 MG tablet Take 0.5 mg by mouth 2 (two) times daily.    [provider]  lovastatin (MEVACOR) 40 MG tablet Take 40 mg by mouth every evening.    [provider]  metoprolol succinate (TOPROL-XL) 25 MG 24 hr tablet Take 0.5 tablets (12.5 mg total) by mouth daily. 10/02/22   Loel Dubonnet, NP  Multiple Vitamins-Minerals (EYE VITAMINS) CAPS as directed Orally    [provider]  omeprazole (PRILOSEC) 10 MG capsule Take 10 mg by mouth every evening. 10/16/21   [provider]  potassium chloride SA (KLOR-CON M) 20 MEQ tablet Take 1 tablet (20 mEq total) by mouth daily. 01/09/22   [name], MD  sacubitril-valsartan (ENTRESTO) 24-26 MG Take 1 tablet by mouth 2 (two) times daily. 09/25/22   [name], MD  spironolactone (ALDACTONE) 25 MG tablet Take 0.5 tablets (12.5 mg total) by mouth daily. 06/06/22 06/01/23  Loel Dubonnet, NP  thiamine (VITAMIN B-1) 50 MG tablet Take 25 mg by mouth daily.    [provider]      Allergies    Fluoxetine, Keflex [cephalexin], and Serotonin reuptake inhibitors (ssris)    Review of Systems   Review of Systems  Cardiovascular:  Negative for chest pain.  Gastrointestinal:  Positive for abdominal pain, diarrhea and nausea.    Physical Exam Updated Vital Signs BP (!) 125/54 (BP Location: Right Arm)   Pulse 62   Temp (!) 97.2 F (36.2 C) (Temporal)   Resp 16   Ht '5\' 6"'$  (1.676 m)   Wt 88.5 kg   SpO2 97%   BMI 31.49 kg/m  Physical Exam Vitals and nursing note reviewed.  Constitutional:      General: She is not in acute distress.    Appearance: She is well-developed.  HENT:     Head: Normocephalic and atraumatic.  Eyes:     Conjunctiva/sclera: Conjunctivae normal.  Cardiovascular:     Rate and Rhythm: Normal rate and regular rhythm.     Heart sounds: No murmur heard. Pulmonary:     Effort: Pulmonary effort is normal. No respiratory distress.     Breath sounds: Normal breath sounds.  Abdominal:     Palpations: Abdomen is soft.     Tenderness: There is generalized abdominal tenderness and tenderness in the right upper quadrant. There is guarding and rebound.     Hernia: A hernia is present. Hernia is present in the umbilical area.  Musculoskeletal:        General: No swelling.     Cervical back: Neck supple.  Skin:    General: Skin is warm and dry.     Capillary Refill: Capillary refill takes less than 2 seconds.  Neurological:     Mental Status: She is alert.  Psychiatric:        Mood and Affect: Mood normal.     ED Results / Procedures / Treatments   Labs (all labs ordered are listed, but only  abnormal results are displayed) Labs Reviewed  LIPASE, BLOOD - Abnormal; Notable for the following components:      Result Value   Lipase <10 (*)    All other components within normal limits  COMPREHENSIVE METABOLIC PANEL - Abnormal; Notable for the following components:   Sodium 133 (*)    Potassium 5.5 (*)    Glucose, Bld 116 (*)    Creatinine, Ser 1.24 (*)    GFR, Estimated 42 (*)    All other components within normal  limits  RESP PANEL BY RT-PCR (RSV, FLU A&B, COVID)  RVPGX2  CBC  URINALYSIS, ROUTINE W REFLEX MICROSCOPIC  LACTIC ACID, PLASMA  LACTIC ACID, PLASMA  POTASSIUM    EKG EKG Interpretation  Date/Time:  Tuesday October 07 2022 17:13:50 EST Ventricular Rate:  60 PR Interval:    QRS Duration: 145 QT Interval:  485 QTC Calculation: 485 R Axis:   48 Text Interpretation: Normal sinus rhythm Right bundle branch block Confirmed by Regan Lemming ([id number]) on 10/07/2022 5:24:04 PM  Radiology CT ABDOMEN PELVIS W CONTRAST  Result Date: 10/07/2022 CLINICAL DATA:  Abdominal pain, acute, nonlocalized. Endorses RLQ ABD that spans across to the LLQ. This began a few days ago. Normally has Loose Bowels but has recently been more loose and watery. Some nausea. No Emesis or Fevers Hx of GERD, hysterectomy EXAM: CT ABDOMEN AND PELVIS WITH CONTRAST TECHNIQUE: Multidetector CT imaging of the abdomen and pelvis was performed using the standard protocol following bolus administration of intravenous contrast. RADIATION DOSE REDUCTION: This exam was performed according to the departmental dose-optimization program which includes automated exposure control, adjustment of the mA and/or kV according to patient size and/or use of iterative reconstruction technique. CONTRAST:  34m OMNIPAQUE IOHEXOL 300 MG/ML  SOLN COMPARISON:  None Available. FINDINGS: Lower chest: No acute abnormality. Cardiac leads noted. Coronary artery calcification. Tiny hiatal hernia. Hepatobiliary: Subcentimeter hypodensity too  Kaicee Scarpino to characterize. Otherwise no focal liver abnormality. No gallstones, gallbladder wall thickening, or pericholecystic fluid. No biliary dilatation. Pancreas: Diffusely atrophic. No focal lesion. Otherwise normal pancreatic contour. No surrounding inflammatory changes. No main pancreatic ductal dilatation. Spleen: Normal in size without focal abnormality. Adrenals/Urinary Tract: No adrenal nodule bilaterally. Bilateral kidneys enhance symmetrically. Exophytic 5.3 cm right fluid density lesion likely represents a simple renal cysts. Simple renal cysts, in the absence of clinically indicated signs/symptoms, require no independent follow-up. Subcentimeter hypodensities are too Tallin Hart to characterize-no further follow-up indicated. No hydronephrosis. No hydroureter.  No nephroureterolithiasis. The urinary bladder is unremarkable. No excretion of intravenous contrast from the kidneys on delayed imaging. Stomach/Bowel: Stomach is within normal limits. No evidence of bowel wall thickening or dilatation. Colonic diverticulosis. The appendix is not definitely identified with no inflammatory changes in the right lower quadrant to suggest acute appendicitis. Vascular/Lymphatic: No abdominal aorta or iliac aneurysm. Moderate atherosclerotic plaque of the aorta and its branches. No abdominal, pelvic, or inguinal lymphadenopathy. Reproductive: Status post hysterectomy. No adnexal masses. Other: No intraperitoneal free fluid. No intraperitoneal free gas. No organized fluid collection. Nonspecific scattered misty mesentery of the Daelen Belvedere bowel mesentery (2:52, 57, 59). Musculoskeletal: Dennise Raabe to moderate volume umbilical hernia containing fat with an abdominal defect of 2.4 x 2.5 cm. The contained fat demonstrates marked fat stranding and query associated soft tissue density. No suspicious lytic or blastic osseous lesions. No acute displaced fracture. Multilevel degenerative changes of the spine. IMPRESSION: 1. Mckensi Redinger to moderate  volume fat containing umbilical hernia with the abdominal defect of 2.5 cm. Associated stranding of the contained fat in query associated soft tissue density. Correlate clinically for incarceration. Underlying mass lesion is not excluded. 2. Nonspecific scattered misty mesentery of the Shadaya Marschner bowel mesentery. Correlate clinically for prior malignancy given history of hysterectomy. Recommend attention on follow-up. 3. No excretion of intravenous contrast from the kidneys on delayed imaging. Correlate with renal function. 4. Colonic diverticulosis with no acute diverticulitis. 5.  Aortic Atherosclerosis (ICD10-I70.0). 6. The appendix is not definitely identified with no inflammatory changes in the right lower quadrant to suggest acute appendicitis.  Electronically Signed   By: Iven Finn M.D.   On: 10/07/2022 17:15    Procedures Procedures    Medications Ordered in ED Medications  HYDROcodone-acetaminophen (NORCO/VICODIN) 5-325 MG per tablet 1 tablet (has no administration in time range)  sodium zirconium cyclosilicate (LOKELMA) packet 10 g (has no administration in time range)  iohexol (OMNIPAQUE) 300 MG/ML solution 100 mL (75 mLs Intravenous Contrast Given 10/07/22 1650)  ondansetron (ZOFRAN) injection 4 mg (4 mg Intravenous Given 10/07/22 1727)  fentaNYL (SUBLIMAZE) injection 25 mcg (25 mcg Intravenous Given 10/07/22 1726)  sodium chloride 0.9 % bolus 500 mL (500 mLs Intravenous New Bag/Given 10/07/22 1752)    ED Course/ Medical Decision Making/ A&P                           Medical Decision Making Patient is 89 year old female, here for nausea, diarrhea, abdominal pain for the last week.  She has guarding of bilateral upper quadrants, and a tender hard umbilical hernia.  She states his hernias are not typically hard, and she has had it for several years.  We will obtain a CT abdomen pelvis, lactic acid, and labs.  Amount and/or Complexity of Data Reviewed Labs: ordered.    Details: Hyperkalemic  at 5.5, normal lactate Radiology:     Details: CT abdomen pelvis concerning for incarcerated umbilical hernia that is containing fat.  No bowel. ECG/medicine tests:  Decision-making details documented in ED Course. Discussion of management or test interpretation with external provider(s): Patient found to have incarcerated umbilical hernia, not containing bowel, just containing fat, discussed with Dr. Bobbye Morton, she recommends reducing hernia or attempting to reduce hernia, and follow-up in office, unable to reduce hernia, informed MD, follow-up in office.  Pain control, Norco sent to the pharmacy.  Her pain was well-controlled at discharge, and she was sent some nausea pills as well to help with this.  She was informed that she will need to follow-up with her primary care doctor or cardiologist for clearance prior to surgery allowance.  She will need to follow-up with general surgery for consultation in general.  Advised on return precautions such as not having bowel movements, not passing gas, intractable nausea or vomiting, or worsening abdominal pain.  Advised to take MiraLAX with Norco to help prevent constipation.  Patiently found to be hyperkalemic, advised to follow-up with PCP, and stop taking potassium supplementation for the next 4 days, will need repeat potassium in next week.  Risk Prescription drug management.   Final Clinical Impression(s) / ED Diagnoses Final diagnoses:  Incarcerated umbilical hernia  Hyperkalemia    Rx / DC Orders ED Discharge Orders          Ordered    HYDROcodone-acetaminophen (NORCO) 5-325 MG tablet  Every 6 hours PRN        10/07/22 1832    polyethylene glycol (MIRALAX) 17 g packet  Daily        10/07/22 1832    ondansetron (ZOFRAN) 4 MG tablet  Every 6 hours        10/07/22 1839              Tarren Sabree, Si Gaul, PA 10/07/22 1845    Regan Lemming, MD 10/07/22 2059

## 2022-10-07 NOTE — ED Notes (Signed)
Pt given water per request, PA Brooke in agreement.

## 2022-10-07 NOTE — ED Triage Notes (Signed)
Patient here POV from Home.  Endorses RLQ ABD that spans across to the LLQ. This began a few days ago. Normally has Loose Bowels but has recently been more loose and watery.   Some nausea. No Emesis or Fevers.   NAD Noted during Triage. A&Ox4. GCS 15. Ambulatory.

## 2022-10-07 NOTE — ED Notes (Signed)
Patient transported to CT 

## 2022-10-07 NOTE — Discharge Instructions (Addendum)
You will need to follow-up with a general surgeon for surgery.  You will need to also have clearance by your PCP prior to the surgery.  You have been given narcotics for pain control, and you should use MiraLAX, with this to prevent any kind of constipation.  Opioids can increase your risk of falls, just be careful when using them.  If you have worsening pain, or stop having bowel movements or passing gas. Please follow-up with your PCP in regards to your elevated potassium and have your labs rechecked in 1 week. Please hold your potassium pills for the next 4 days.

## 2022-10-09 ENCOUNTER — Telehealth: Payer: Self-pay

## 2022-10-09 NOTE — Telephone Encounter (Signed)
     Patient  visit on 1/2  at [facility] you been able to follow up with your primary care physician? No   The patient was or was not able to obtain any needed medicine or equipment. yes  Are there diet recommendations that you are having difficulty following? Na   Patient expresses understanding of discharge instructions and education provided has no other needs at this time. Yes       [place], [facility], Care Management  [phone] [address], [place], [place] [address] Phone: [phone] Email: [email or web address]

## 2022-10-13 NOTE — Progress Notes (Signed)
Remote pacemaker transmission.   

## 2022-10-14 ENCOUNTER — Ambulatory Visit (INDEPENDENT_AMBULATORY_CARE_PROVIDER_SITE_OTHER): Payer: Medicare HMO

## 2022-10-14 DIAGNOSIS — I10 Essential (primary) hypertension: Secondary | ICD-10-CM | POA: Diagnosis not present

## 2022-10-14 DIAGNOSIS — I5042 Chronic combined systolic (congestive) and diastolic (congestive) heart failure: Secondary | ICD-10-CM

## 2022-10-14 DIAGNOSIS — I4819 Other persistent atrial fibrillation: Secondary | ICD-10-CM | POA: Diagnosis not present

## 2022-10-14 LAB — ECHOCARDIOGRAM COMPLETE
Area-P 1/2: 3.21 cm2
P 1/2 time: 425 msec
S' Lateral: 3.4 cm

## 2022-10-15 ENCOUNTER — Institutional Professional Consult (permissible substitution): Payer: Medicare HMO | Admitting: Pulmonary Disease

## 2022-10-16 ENCOUNTER — Telehealth (HOSPITAL_BASED_OUTPATIENT_CLINIC_OR_DEPARTMENT_OTHER): Payer: Self-pay

## 2022-10-16 ENCOUNTER — Ambulatory Visit: Payer: Self-pay | Admitting: Surgery

## 2022-10-16 DIAGNOSIS — K429 Umbilical hernia without obstruction or gangrene: Secondary | ICD-10-CM | POA: Diagnosis not present

## 2022-10-16 NOTE — H&P (Signed)
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  This is associate with nausea, and diarrhea.  Reported decreased appetite, denies any fevers or chills.  Fairly diffuse, intermittently stabbing versus dull but constant.  No known aggravating factors, but her daughter notes that she has had an increasingly forceful cough for several months now.  She is actually seeing pulmonology for this tomorrow.  Also notes that she had a bout of significant constipation leading up to this event.  In the emergency room, she underwent lab work and CT which was notable for elevated creatinine at 1.24, potassium of 5.5, and incarcerated umbilical hernia containing fat.  Of note she has recently seen Dr. 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  She is on Eliquis and amiodarone.  She has a chronic cough for several months now.  Awaiting pulmonology follow-up.    Review of Systems: A complete review of systems was obtained from the patient.  I have reviewed this information and discussed as appropriate with the patient.  See HPI as well for other ROS.   Medical History: Past Medical History:  Diagnosis Date   Anxiety    CHF (congestive heart failure) (CMS-HCC)    GERD (gastroesophageal reflux  disease)    Hypertension    Thyroid disease     There is no problem list on file for this patient.   Past Surgical History:  Procedure Laterality Date   JOINT REPLACEMENT     pacemaker surgery       Allergies  Allergen Reactions   Cephalexin Other (See Comments)    Daughter reports severe reaction specifically with keflex, Neither can remember what it was currently.  Tolerated Cefdinir 05/2022   Serotonin Unknown    Other reaction(s): hyponatremia    Current Outpatient Medications on File Prior to Visit  Medication Sig Dispense Refill   acetaminophen (TYLENOL) 500 MG tablet Take by mouth     albuterol 90 mcg/actuation inhaler Inhale into the lungs     AMIOdarone (PACERONE) 200 MG tablet Take by mouth     benzonatate (TESSALON) 100 MG capsule Take 100 mg by mouth 3 (three) times daily as needed     carboxymethylcellulose (REFRESH TEARS) 0.5 % ophthalmic solution Apply to eye     cyanocobalamin (VITAMIN B12) 1000 MCG tablet Take by mouth     FLUoxetine (PROZAC) 20 MG capsule Take 20 mg by mouth once daily     ipratropium (ATROVENT) 0.06 % nasal spray 2 sprays in each nostril Nasally Three times a day for 30 days     ketoconazole (NIZORAL) 2 % cream Apply topically     levothyroxine (SYNTHROID) 75 MCG tablet Take by mouth     loratadine (CLARITIN) 10 mg tablet Take by mouth     LORazepam (ATIVAN) 0.5 MG tablet Take 0.5 mg by mouth 2 (two) times daily  lovastatin (MEVACOR) 40 MG tablet 1 tablet with the evening meal Orally Once a day for 30 days     metoprolol succinate (TOPROL-XL) 25 MG XL tablet Take 12.5 mg by mouth once daily     potassium chloride (KLOR-CON) 20 MEQ ER tablet Take 1 tablet by mouth once daily     spironolactone (ALDACTONE) 25 MG tablet Take by mouth     thiamine (VITAMIN B-1) 50 MG tablet Take 25 mg by mouth once daily     No current facility-administered medications on file prior to visit.    No family history on file.   Social History   Tobacco  Use  Smoking Status Never  Smokeless Tobacco Never     Social History   Socioeconomic History   Marital status: Widowed  Tobacco Use   Smoking status: Never   Smokeless tobacco: Never  Substance and Sexual Activity   Alcohol use: Not Currently   Drug use: Never    Objective:    Vitals:   10/16/22 1053  BP: 126/76  Pulse: 70  Temp: 36.7 C (98 F)  SpO2: 92%  Weight: 88.1 kg (194 lb 3.2 oz)  Height: 167.6 cm ('5\' 6"'$ )    Body mass index is 31.34 kg/m.  Gen: A&Ox3, no distress  Unlabored respirations Abdomen is soft, nondistended, focally tender in the supraumbilical field where there is a firm palpable subcutaneous mass consistent with known incarcerated hernia.  This is not reducible, fascial defect is not palpable.Marland Kitchen Psych: appropriate mood and affect, normal insight/judgment intact  Skin: warm and dry   Labs, Imaging and Diagnostic Testing:  IMPRESSION: 1. Small to moderate volume fat containing umbilical hernia with the abdominal defect of 2.5 cm. Associated stranding of the contained fat in query associated soft tissue density. Correlate clinically for incarceration. Underlying mass lesion is not excluded. 2. Nonspecific scattered misty mesentery of the small bowel mesentery. Correlate clinically for prior malignancy given history of hysterectomy. Recommend attention on follow-up. 3. No excretion of intravenous contrast from the kidneys on delayed imaging. Correlate with renal function. 4. Colonic diverticulosis with no acute diverticulitis. 5.  Aortic Atherosclerosis (ICD10-I70.0). 6. The appendix is not definitely identified with no inflammatory changes in the right lower quadrant to suggest acute appendicitis. Assessment and Plan:  Diagnoses and all orders for this visit:  Umbilical hernia without obstruction and without gangrene    Given pain associated with this, I do recommend proceeding with repair.  We discussed that this would be done in an open  fashion, likely with mesh.  We discussed the procedure and risks of bleeding, infection, pain, scarring, injury to intra-abdominal structures such as bowel, wound healing problems, seroma/hematoma, ileus or obstruction.  Discussed risk of hernia recurrence.  We also discussed that her risk of systemic complications from undergoing general anesthesia is higher than average due to her age and cardiac history.  Foregoing surgery is an option, and would anticipate that over time the incarcerated fatty tissue would become less painful however it is not clear how long this would take.  Risk of bowel incarceration is fairly low given that the hernia is currently occupied by incarcerated fat, but this is also a very small possibility going forward in the case of an unrepaired hernia.  Questions were welcomed and answered to her and her daughter satisfaction.  We will request clearance from her cardiologist (Dr. Oval Linsey) to further gauge whether she is a candidate to undergo general anesthesia.  I think that if her risk is  acceptable, her quality of life would be improved by having this repaired.  Tashi Band Raquel James, MD

## 2022-10-16 NOTE — Telephone Encounter (Signed)
   Pre-operative Risk Assessment    Patient Name: [name]  DOB: September 04, 1931 MRN: [id number]      Request for Surgical Clearance    Procedure:   Hernia Repair  Date of Surgery:  Clearance TBD                                 Surgeon:  Jens Som, MD Surgeon's Group or Practice Name:  [facility] Phone number:  [phone] Fax number:  [phone]   Type of Clearance Requested:   - Medical  - Pharmacy:  Hold Apixaban (Eliquis) how long prior to surgery?   Type of Anesthesia:  General    Additional requests/questions:   None  Signed, Francella Solian   10/16/2022, 2:15 PM

## 2022-10-17 NOTE — Telephone Encounter (Signed)
Patient with diagnosis of PAF(paroxysmal atrial fibrillation) on Apixaban for anticoagulation.    Procedure: Hernia Repair  Date of procedure: TBD   CHA2DS2-VASc Score = 5   This indicates a 7.2% annual risk of stroke. The patient's score is based upon: CHF History: 1 HTN History: 1 Diabetes History: 0 Stroke History: 0 Vascular Disease History: 0 Age Score: 2 Gender Score: 1    CrCl 34 ml/min (Adj BW) (SrCr.1.24 10/07/2022) Platelet count 284 (10/07/2022)  Per office protocol, patient can hold Apixaban for 2-3 days prior to procedure.     **This guidance is not considered finalized until pre-operative APP has relayed final recommendations.**

## 2022-10-17 NOTE — Telephone Encounter (Signed)
   Primary Cardiologist: [name], MD  Chart reviewed as part of pre-operative protocol coverage. Given past medical history and time since last visit, based on ACC/AHA guidelines, [place] would be at acceptable risk for the planned procedure without further cardiovascular testing. She is Class II (0.9%) risk for upcoming procedure.   Patient was advised that if she develops new symptoms prior to surgery to contact our office to arrange a follow-up appointment.    Per office protocol, patient can hold Apixaban for 2-3 days prior to procedure.     I will route this recommendation to the requesting party via Epic fax function and remove from pre-op pool.  Please call with questions.  Emmaline Life, NP-C  10/17/2022, 10:56 AM [address], [address] Office [phone] Fax [phone]

## 2022-10-17 NOTE — Telephone Encounter (Signed)
Will, patient seen by you on 09/25/22 now seeking clearance for hernia repair. Since office visit < 2 months, can you provide clearance for her to proceed to surgery without further cardiac testing?  Please send your response to p cv div preop.  Thank you, Sharyn Lull

## 2022-10-20 ENCOUNTER — Other Ambulatory Visit (HOSPITAL_BASED_OUTPATIENT_CLINIC_OR_DEPARTMENT_OTHER): Payer: Self-pay

## 2022-10-20 DIAGNOSIS — I480 Paroxysmal atrial fibrillation: Secondary | ICD-10-CM

## 2022-10-23 ENCOUNTER — Ambulatory Visit: Payer: Medicare HMO | Admitting: Pulmonary Disease

## 2022-10-23 ENCOUNTER — Encounter: Payer: Self-pay | Admitting: Pulmonary Disease

## 2022-10-23 VITALS — BP 116/74 | HR 66 | Ht 66.0 in | Wt 193.0 lb

## 2022-10-23 DIAGNOSIS — J4520 Mild intermittent asthma, uncomplicated: Secondary | ICD-10-CM | POA: Diagnosis not present

## 2022-10-23 DIAGNOSIS — R053 Chronic cough: Secondary | ICD-10-CM | POA: Diagnosis not present

## 2022-10-23 DIAGNOSIS — K219 Gastro-esophageal reflux disease without esophagitis: Secondary | ICD-10-CM | POA: Diagnosis not present

## 2022-10-23 MED ORDER — FAMOTIDINE 20 MG PO TABS: 20.0000 mg | ORAL_TABLET | Freq: Every day | ORAL | 2 refills | Status: DC

## 2022-10-23 MED ORDER — FAMOTIDINE 20 MG PO TABS: 20.0000 mg | ORAL_TABLET | Freq: Two times a day (BID) | ORAL | 0 refills | Status: DC

## 2022-10-23 MED ORDER — FLUTICASONE-SALMETEROL 115-21 MCG/ACT IN AERO: 2.0000 | INHALATION_SPRAY | Freq: Two times a day (BID) | RESPIRATORY_TRACT | 12 refills | Status: DC

## 2022-10-23 NOTE — Progress Notes (Signed)
Synopsis: Referred in January 2024 for Chronic Cough by [name], MD  Subjective:   PATIENT ID: [name] GENDER: female DOB: [birth year], MRN: [id number]   HPI  Chief Complaint  Patient presents with   Consult    Referred by pcp for cough. States she had the cough for over a year. Productive cough with thick clear phlegm.    [name] is an 89 year old woman, never smoker with GERD, hypertension and combined systolic/diastolic heart failure who is referred to pulmonary clinic for cough.   She reports having chronic dry cough which has become more productive over the last year. The cough does not keep her up at night. She does have progressive dyspnea over the past year and some wheezing. She does have small hiatal hernia noted on CT abdomen. She is being scheduled for umbilical hernia repair in near future. She does have post-nasal drainage. She does have history of reflux that appears somewhat controlled on daily omeprazole.   Her husband recently passed away in March 15, 2022. She is a never smoker. She lives with her daughter. She is sleeping in a recliner chair. She has been on amiodarone and entresto since 12/2021. She denies swelling in her legs.   Past Medical History:  Diagnosis Date   Chronic combined systolic and diastolic heart failure ([place]) 08/26/2022   Dyspnea    diastolic dysfunction by echo 2012   GERD (gastroesophageal reflux disease)    Hypertension    Mild mitral and aortic regurgitation    Osteoarthritis    Right bundle branch block 10/06/2012   Thyroid disease      Family History  Problem Relation Age of Onset   Heart attack Mother      Social History   Socioeconomic History   Marital status: Widowed    Spouse name: [name]   Number of children: 1   Years of education: Not on file   Highest education level: High school graduate  Occupational History   Occupation: retired  Tobacco Use   Smoking status: Never   Smokeless  tobacco: Never  Vaping Use   Vaping Use: Never used  Substance and Sexual Activity   Alcohol use: No   Drug use: No   Sexual activity: Not on file  Other Topics Concern   Not on file  Social History Narrative   Not on file   Social Determinants of Health   Financial Resource Strain: Low Risk  (12/16/2021)   Overall Financial Resource Strain (CARDIA)    Difficulty of Paying Living Expenses: Not hard at all  Food Insecurity: No Food Insecurity (12/16/2021)   Hunger Vital Sign    Worried About Running Out of Food in the Last Year: Never true    [place] in the Last Year: Never true  Transportation Needs: No Transportation Needs (12/16/2021)   PRAPARE - Hydrologist (Medical): No    Lack of Transportation (Non-Medical): No  Physical Activity: Not on file  Stress: Not on file  Social Connections: Not on file  Intimate Partner Violence: Not on file     Allergies  Allergen Reactions   Fluoxetine     Other reaction(s): hyponatremia   Keflex [Cephalexin] Other (See Comments)    Daughter reports severe reaction specifically with keflex, Neither can remember what it was currently. Tolerated Cefdinir 05/2022   Serotonin Reuptake Inhibitors (Ssris)     Other reaction(s): hyponatremia     Outpatient Medications  Prior to Visit  Medication Sig Dispense Refill   acetaminophen (TYLENOL) 500 MG tablet Take 1,000 mg by mouth every 6 (six) hours as needed for mild pain.     albuterol (VENTOLIN HFA) 108 (90 Base) MCG/ACT inhaler Inhale 1 puff into the lungs every 6 (six) hours as needed for wheezing or shortness of breath.     amiodarone (PACERONE) 200 MG tablet Take 1 tablet (200 mg total) by mouth daily. 90 tablet 2   apixaban (ELIQUIS) 5 MG TABS tablet Take 1 tablet (5 mg total) by mouth 2 (two) times daily. 180 tablet 1   benzonatate (TESSALON PERLES) 100 MG capsule Take 1 capsule (100 mg total) by mouth 3 (three) times daily as needed for cough. 20  capsule 0   carboxymethylcellulose (REFRESH PLUS) 0.5 % SOLN Place 1 drop into both eyes daily as needed (dry eyes).     Cholecalciferol (VITAMIN D) 50 MCG (2000 UT) CAPS Take 2,000 Units by mouth daily.     cyanocobalamin (VITAMIN B12) 1000 MCG tablet Take 2,000 mcg by mouth daily.     furosemide (LASIX) 20 MG tablet Take '40mg'$  (2 tablets) daily. May take additional '20mg'$  (1 tablet) as needed for weight gain of 2 pounds overnight or 5 pounds in one week 90 tablet 3   HYDROcodone-acetaminophen (NORCO) 5-325 MG tablet Take 1 tablet by mouth every 6 (six) hours as needed for moderate pain. 20 tablet 0   ipratropium (ATROVENT) 0.06 % nasal spray 2 sprays in each nostril Nasally Three times a day for 30 days     ketoconazole (NIZORAL) 2 % cream Apply 1 Application topically 2 (two) times daily as needed for rash.     LORazepam (ATIVAN) 0.5 MG tablet Take 0.5 mg by mouth 2 (two) times daily.     lovastatin (MEVACOR) 40 MG tablet Take 40 mg by mouth every evening.     metoprolol succinate (TOPROL-XL) 25 MG 24 hr tablet Take 0.5 tablets (12.5 mg total) by mouth daily. 45 tablet 3   Multiple Vitamins-Minerals (EYE VITAMINS) CAPS as directed Orally     omeprazole (PRILOSEC) 10 MG capsule Take 10 mg by mouth every evening.     ondansetron (ZOFRAN) 4 MG tablet Take 1 tablet (4 mg total) by mouth every 6 (six) hours. 12 tablet 0   polyethylene glycol (MIRALAX) 17 g packet Take 17 g by mouth daily. 14 each 0   potassium chloride SA (KLOR-CON M) 20 MEQ tablet Take 1 tablet (20 mEq total) by mouth daily. 90 tablet 3   sacubitril-valsartan (ENTRESTO) 24-26 MG Take 1 tablet by mouth 2 (two) times daily. 180 tablet 3   spironolactone (ALDACTONE) 25 MG tablet Take 0.5 tablets (12.5 mg total) by mouth daily. 45 tablet 3   thiamine (VITAMIN B-1) 50 MG tablet Take 25 mg by mouth daily.     levothyroxine (SYNTHROID) 75 MCG tablet Take 3 tablets (225 mcg total) by mouth daily at 6 (six) AM. 90 tablet 0   loratadine  (CLARITIN) 10 MG tablet Take 10 mg by mouth daily as needed for allergies.     No facility-administered medications prior to visit.   Review of Systems  Constitutional:  Negative for chills, fever, malaise/fatigue and weight loss.  HENT:  Negative for congestion, sinus pain and sore throat.   Eyes: Negative.   Respiratory:  Positive for cough, sputum production and shortness of breath. Negative for hemoptysis and wheezing.   Cardiovascular:  Negative for chest pain, palpitations, orthopnea, claudication and  leg swelling.  Gastrointestinal:  Positive for heartburn. Negative for abdominal pain, nausea and vomiting.  Genitourinary: Negative.   Musculoskeletal:  Negative for joint pain and myalgias.  Skin:  Negative for rash.  Neurological:  Negative for weakness.  Endo/Heme/Allergies: Negative.   Psychiatric/Behavioral:  Positive for depression. The patient is nervous/anxious.    Objective:   Vitals:   10/23/22 1344  BP: 116/74  Pulse: 66  SpO2: 99%  Weight: 193 lb (87.5 kg)  Height: '5\' 6"'$  (1.676 m)     Physical Exam    CBC    Component Value Date/Time   WBC 5.9 10/07/2022 1252   RBC 4.46 10/07/2022 1252   HGB 12.8 10/07/2022 1252   HGB 13.0 05/15/2022 0944   HCT 39.6 10/07/2022 1252   HCT 42.0 05/15/2022 0944   PLT 284 10/07/2022 1252   PLT 216 05/15/2022 0944   MCV 88.8 10/07/2022 1252   MCV 86 05/15/2022 0944   MCH 28.7 10/07/2022 1252   MCHC 32.3 10/07/2022 1252   RDW 13.0 10/07/2022 1252   RDW 13.8 05/15/2022 0944   LYMPHSABS 2.0 05/22/2022 2010   MONOABS 0.6 05/22/2022 2010   EOSABS 0.2 05/22/2022 2010   BASOSABS 0.1 05/22/2022 2010      Latest Ref Rng & Units 10/07/2022   12:52 PM 06/20/2022   10:00 AM 05/28/2022    6:33 AM  BMP  Glucose 70 - 99 mg/dL 116  98  77   BUN 8 - 23 mg/dL '14  20  15   '$ Creatinine 0.44 - 1.00 mg/dL 1.24  1.34  1.06   BUN/Creat Ratio 12 - 28  15    Sodium 135 - 145 mmol/L 133  137  136   Potassium 3.5 - 5.1 mmol/L 5.5  4.9   4.3   Chloride 98 - 111 mmol/L 98  99  106   CO2 22 - 32 mmol/L '26  23  23   '$ Calcium 8.9 - 10.3 mg/dL 9.6  9.6  8.8    Chest imaging: CT Abd/Pelvis 10/07/22 Lower chest with no abnormalities. Small hiatal hernia.   PFT:     No data to display          Labs:  Path:  Echo 10/14/22: LV EF 50-55%. Mild LVH. Grade I diastolic dysfunction. RV systolic function and size is normal.   Heart Catheterization:  Assessment & Plan:   Chronic cough  Mild intermittent reactive airway disease without complication - Plan: fluticasone-salmeterol (ADVAIR HFA) 115-21 MCG/ACT inhaler  Gastroesophageal reflux disease without esophagitis - Plan: famotidine (PEPCID) 20 MG tablet  Discussion: Ashleah Valtierra is an 89 year old woman, never smoker with GERD, hypertension and combined systolic/diastolic heart failure who is referred to pulmonary clinic for cough.   She has chronic cough that could be related to post- nasal drainage vs reactive airways disease vs amiodarone lung toxicity.   She is to take omeprazole in the morning before breakfast and start famotidine at bedtime.   She is to use ipratropium nasal spray 2 sprays per nostril twice daily and fluticasone nasal spray 1 spray per nostril daily.  She is to start advair HFA inhaler 115-40mg 2 puffs twice daily.   For her upcoming umbilical hernia repair, she is intermediate risk, 13.3% risk of in-hospital post-op pulmonary complications (composite including respiratory failure, respiratory infection, pleural effusion, atelectasis, pneumothorax, bronchospasm, aspiration pneumonitis) based on ARISCAT post-operative pulmonary risk index.  Follow up in 2 months.   70 minutes were spent on this  visit which included records review, direct patient care, documentation of plan.   Freda Jackson, MD [facility] Office: [phone]   Current Outpatient Medications:    acetaminophen (TYLENOL) 500 MG tablet, Take 1,000 mg  by mouth every 6 (six) hours as needed for mild pain., Disp: , Rfl:    albuterol (VENTOLIN HFA) 108 (90 Base) MCG/ACT inhaler, Inhale 1 puff into the lungs every 6 (six) hours as needed for wheezing or shortness of breath., Disp: , Rfl:    amiodarone (PACERONE) 200 MG tablet, Take 1 tablet (200 mg total) by mouth daily., Disp: 90 tablet, Rfl: 2   apixaban (ELIQUIS) 5 MG TABS tablet, Take 1 tablet (5 mg total) by mouth 2 (two) times daily., Disp: 180 tablet, Rfl: 1   benzonatate (TESSALON PERLES) 100 MG capsule, Take 1 capsule (100 mg total) by mouth 3 (three) times daily as needed for cough., Disp: 20 capsule, Rfl: 0   carboxymethylcellulose (REFRESH PLUS) 0.5 % SOLN, Place 1 drop into both eyes daily as needed (dry eyes)., Disp: , Rfl:    Cholecalciferol (VITAMIN D) 50 MCG (2000 UT) CAPS, Take 2,000 Units by mouth daily., Disp: , Rfl:    cyanocobalamin (VITAMIN B12) 1000 MCG tablet, Take 2,000 mcg by mouth daily., Disp: , Rfl:    famotidine (PEPCID) 20 MG tablet, Take 1 tablet (20 mg total) by mouth 2 (two) times daily., Disp: 30 tablet, Rfl: 0   fluticasone-salmeterol (ADVAIR HFA) 115-21 MCG/ACT inhaler, Inhale 2 puffs into the lungs 2 (two) times daily., Disp: 1 each, Rfl: 12   furosemide (LASIX) 20 MG tablet, Take '40mg'$  (2 tablets) daily. May take additional '20mg'$  (1 tablet) as needed for weight gain of 2 pounds overnight or 5 pounds in one week, Disp: 90 tablet, Rfl: 3   HYDROcodone-acetaminophen (NORCO) 5-325 MG tablet, Take 1 tablet by mouth every 6 (six) hours as needed for moderate pain., Disp: 20 tablet, Rfl: 0   ipratropium (ATROVENT) 0.06 % nasal spray, 2 sprays in each nostril Nasally Three times a day for 30 days, Disp: , Rfl:    ketoconazole (NIZORAL) 2 % cream, Apply 1 Application topically 2 (two) times daily as needed for rash., Disp: , Rfl:    LORazepam (ATIVAN) 0.5 MG tablet, Take 0.5 mg by mouth 2 (two) times daily., Disp: , Rfl:    lovastatin (MEVACOR) 40 MG tablet, Take 40 mg by  mouth every evening., Disp: , Rfl:    metoprolol succinate (TOPROL-XL) 25 MG 24 hr tablet, Take 0.5 tablets (12.5 mg total) by mouth daily., Disp: 45 tablet, Rfl: 3   Multiple Vitamins-Minerals (EYE VITAMINS) CAPS, as directed Orally, Disp: , Rfl:    omeprazole (PRILOSEC) 10 MG capsule, Take 10 mg by mouth every evening., Disp: , Rfl:    ondansetron (ZOFRAN) 4 MG tablet, Take 1 tablet (4 mg total) by mouth every 6 (six) hours., Disp: 12 tablet, Rfl: 0   polyethylene glycol (MIRALAX) 17 g packet, Take 17 g by mouth daily., Disp: 14 each, Rfl: 0   potassium chloride SA (KLOR-CON M) 20 MEQ tablet, Take 1 tablet (20 mEq total) by mouth daily., Disp: 90 tablet, Rfl: 3   sacubitril-valsartan (ENTRESTO) 24-26 MG, Take 1 tablet by mouth 2 (two) times daily., Disp: 180 tablet, Rfl: 3   spironolactone (ALDACTONE) 25 MG tablet, Take 0.5 tablets (12.5 mg total) by mouth daily., Disp: 45 tablet, Rfl: 3   thiamine (VITAMIN B-1) 50 MG tablet, Take 25 mg by mouth daily., Disp: ,  Rfl:    levothyroxine (SYNTHROID) 75 MCG tablet, Take 3 tablets (225 mcg total) by mouth daily at 6 (six) AM., Disp: 90 tablet, Rfl: 0

## 2022-10-23 NOTE — Patient Instructions (Addendum)
Use fluticasone nasal spray, 1 spray per nostril daily in the morning  Use ipratropium nasal spray 2 sprays per nostril twice daily, morning and evening.   Start advair inhaler 2 puffs twice daily (morning and evening) - rinse mouth out after each use  Use albuterol inhaler as needed 1-2 puffs every 4-6 hours as needed for cough and shortness of breath  Take omeprazole '10mg'$  daily, 30 minutes before breakfast/coffee  Take your thyroid medicine 30 minutes before omeprazole  Take Famotidine '20mg'$  at bedtime  Follow up in 2 months, call sooner if needed

## 2022-10-24 ENCOUNTER — Telehealth: Payer: Self-pay | Admitting: Pulmonary Disease

## 2022-10-24 ENCOUNTER — Telehealth: Payer: Self-pay | Admitting: Adult Health

## 2022-10-24 MED ORDER — FLUTICASONE-SALMETEROL 100-50 MCG/ACT IN AEPB: 1.0000 | INHALATION_SPRAY | Freq: Two times a day (BID) | RESPIRATORY_TRACT | 5 refills | Status: DC

## 2022-10-24 NOTE — Telephone Encounter (Signed)
Patient's daughter called in and Advair HFA was going to be $100.  She called her insurance formulary and if we switch to [facility] then it will be $5.  Rx sent in for Wixela 100 1 puff twice daily.  Will send information to Dr. Erin Fulling for [facility]

## 2022-10-24 NOTE — Telephone Encounter (Signed)
Spoke with pt's [name] (per DPR) who states Advair is 100 dollars. Melissa was asked to provide office with pt's insurance formulary. In meantime Dr. Erin Fulling please advise on Advair not being affordable.

## 2022-10-24 NOTE — Telephone Encounter (Signed)
Patient's daughter called regarding the cost for the inaler, Advair.  She stated that it is too expensive and patient cannot afford.  Would like to know if there is a less expensive medication.  CB# [phone]

## 2022-10-25 NOTE — Patient Instructions (Signed)
SURGICAL WAITING ROOM VISITATION Patients having surgery or a procedure may have no more than 2 support people in the waiting area - these visitors may rotate in the visitor waiting room.   Due to an increase in RSV and influenza rates and associated hospitalizations, children ages 66 and under may not visit patients in [place]. If the patient needs to stay at the hospital during part of their recovery, the visitor guidelines for inpatient rooms apply.  PRE-OP VISITATION  Pre-op nurse will coordinate an appropriate time for 1 support person to accompany the patient in pre-op.  This support person may not rotate.  This visitor will be contacted when the time is appropriate for the visitor to come back in the pre-op area.  Please refer to the [facility] website for the visitor guidelines for Inpatients (after your surgery is over and you are in a regular room).  You are not required to quarantine at this time prior to your surgery. However, you must do this: Hand Hygiene often Do NOT share personal items Notify your provider if you are in close contact with someone who has COVID or you develop fever 100.4 or greater, new onset of sneezing, cough, sore throat, shortness of breath or body aches.  If you test positive for Covid or have been in contact with anyone that has tested positive in the last 10 days please notify you surgeon.    Your procedure is scheduled on:  Friday October 31, 2022  Report to [facility] Main Entrance: [place] entrance where the [employer] is available.   Report to admitting at: 09:15 AM  +++++Call this number if you have any questions or problems the morning of surgery [phone]  DO NOT EAT OR DRINK ANYTHING AFTER MIDNIGHT THE NIGHT PRIOR TO YOUR SURGERY / PROCEDURE.   FOLLOW BOWEL PREP AND ANY ADDITIONAL PRE OP INSTRUCTIONS YOU RECEIVED FROM YOUR SURGEON'S OFFICE!!!   Oral Hygiene is also important to reduce your risk of infection.         Remember - BRUSH YOUR TEETH THE MORNING OF SURGERY WITH YOUR REGULAR TOOTHPASTE  Take ONLY these medicines the morning of surgery with A SIP OF WATER: amiodarone (Pacerone), metoprolol, levothyroxine (Synthroid), omeprazole (Prilosec), You may use your albuterol & Wixel inhalers and your ipratropium (Atrovent) nasal spray. You may use your Refresh Eye drops if needed.                    You may not have any metal on your body including hair pins, jewelry, and body piercing  Do not wear make-up, lotions, powders, perfumes or deodorant  Do not wear nail polish including gel and S&S, artificial / acrylic nails, or any other type of covering on natural nails including finger and toenails. If you have artificial nails, gel coating, etc., that needs to be removed by a nail salon, Please have this removed prior to surgery. Not doing so may mean that your surgery could be cancelled or delayed if the Surgeon or anesthesia staff feels like they are unable to monitor you safely.   Do not shave 48 hours prior to surgery to avoid nicks in your skin which may contribute to postoperative infections.   Contacts, Hearing Aids, dentures or bridgework may not be worn into surgery. DENTURES WILL BE REMOVED PRIOR TO SURGERY PLEASE DO NOT APPLY "Poly grip" OR ADHESIVES!!!  Patients discharged on the day of surgery will not be allowed to drive home.  Someone  NEEDS to stay with you for the first 24 hours after anesthesia.  Do not bring your home medications to the hospital. The Pharmacy will dispense medications listed on your medication list to you during your admission in the Hospital.  Special Instructions: Bring a copy of your healthcare power of attorney and living will documents the day of surgery, if you wish to have them scanned into your [facility] Records- EPIC  Please read over the following fact sheets you were given: IF YOU HAVE QUESTIONS ABOUT YOUR PRE-OP INSTRUCTIONS, PLEASE CALL  [phone]  ([place])   [place] - Preparing for Surgery Before surgery, you can play an important role.  Because skin is not sterile, your skin needs to be as free of germs as possible.  You can reduce the number of germs on your skin by washing with CHG (chlorahexidine gluconate) soap before surgery.  CHG is an antiseptic cleaner which kills germs and bonds with the skin to continue killing germs even after washing. Please DO NOT use if you have an allergy to CHG or antibacterial soaps.  If your skin becomes reddened/irritated stop using the CHG and inform your nurse when you arrive at Short Stay. Do not shave (including legs and underarms) for at least 48 hours prior to the first CHG shower.  You may shave your face/neck.  Please follow these instructions carefully:  1.  Shower with CHG Soap the night before surgery and the  morning of surgery.  2.  If you choose to wash your hair, wash your hair first as usual with your normal  shampoo.  3.  After you shampoo, rinse your hair and body thoroughly to remove the shampoo.                             4.  Use CHG as you would any other liquid soap.  You can apply chg directly to the skin and wash.  Gently with a scrungie or clean washcloth.  5.  Apply the CHG Soap to your body ONLY FROM THE NECK DOWN.   Do not use on face/ open                           Wound or open sores. Avoid contact with eyes, ears mouth and genitals (private parts).                       Wash face,  Genitals (private parts) with your normal soap.             6.  Wash thoroughly, paying special attention to the area where your  surgery  will be performed.  7.  Thoroughly rinse your body with warm water from the neck down.  8.  DO NOT shower/wash with your normal soap after using and rinsing off the CHG Soap.            9.  Pat yourself dry with a clean towel.            10.  Wear clean pajamas.            11.  Place clean sheets on your bed the night of your first shower and  do not  sleep with pets.  ON THE DAY OF SURGERY : Do not apply any lotions/deodorants the morning of surgery.  Please wear clean clothes to the hospital/surgery center.  FAILURE TO FOLLOW THESE INSTRUCTIONS MAY RESULT IN THE CANCELLATION OF YOUR SURGERY  PATIENT SIGNATURE_________________________________  NURSE SIGNATURE__________________________________  ________________________________________________________________________

## 2022-10-25 NOTE — Progress Notes (Addendum)
Patient came in for her PST visit with her daughter. Patient has been having ? periumbilical pain, N&V, diaphoresis and anorexia x 2 days. Her daughter called Dr. Ron Parker office over the weekend but did not leave a message. They asked me to call Dr. Ron Parker office and let them know what is happening. I talked to Hassan Rowan (Connor's surgery scheduler) and she paged Dr. Kae Heller about this patient. Dr. Kae Heller said that the patient has a chronically incarcerated umbilical hernia and that she can go to the ED to be checked out. I got a wheelchair and rolled Mr. Kato to the [facility] ED for evaluation; her daughter was with her and both thanked me for getting her to the ED.     PST Information:   COVID Vaccine received:  _0  No _1  Yes Date of any COVID positive Test in last 90 days:  PCP - Merrilee Seashore, MD Cardiologist - [name], MD [place], MD Pulmonology- Freda Jackson, MD  Chest x-ray -  EKG - 10-10-2022  epic  Stress Test -  ECHO - 10-14-2022 epic Cardiac Cath -   PCR screen: _2  Ordered & Completed                      _3   No Order but Needs PROFEND                      _4   N/A for this surgery  Surgery Plan:  _5  Ambulatory                            _6  Outpatient in bed                            _7  Admit  Anesthesia:    _8  General  _9  Spinal                           _10   Choice _11   MAC  Bowel Prep - _12  No  _13   Yes _____________  Pacemaker / ICD device _14  No _15  Yes  Medtronic Dual chamber PPM- Azure   Last device check: 08-31-22  epic      Device order form faxed _16  No    _17   Yes  ++++ IB Message sent to [facility] 10-27-2022  Spinal Cord Stimulator:_18  No _19  Yes      (Remind patient to bring remote DOS) Other Implants:   History of Sleep Apnea? _20  No _21  Yes   CPAP used?- _22  No _23  Yes    Does the patient monitor blood sugar? _24  No _25  Yes  _26  N/A  Blood Thinner / Instructions:Eliquis 2-3- day hold , per Cammy [facility]  10-17-2022 note Aspirin Instructions:  none  ERAS Protocol Ordered: _27  No  _28  Yes (ERAS on Special Needs)  Patient is to be NPO after: Midnight prior (Per MD Orders placed)  Comments:   Activity level: Patient can / can not climb a flight of stairs without difficulty; _29  No CP  _30  No SOB, but would have ______   Patient can / can not perform ADLs without assistance.   Anesthesia review: CHF, HTN, PPM- Medtronic Dual Chamber _08-22-23 for Tachy/Brady Syn., A.Fib - cardioversions x2 (12-26-21 & 05-22-22), Anemia, CKD3b, Mild to moderate Aortic regurg.  Patient denies shortness of breath, fever, cough and chest pain  at PAT appointment.  Patient verbalized understanding and agreement to the Pre-Surgical Instructions that were given to them at this PAT appointment. Patient was also educated of the need to review these PAT instructions again prior to his/her surgery.I reviewed the appropriate phone numbers to call if they have any and questions or concerns.

## 2022-10-26 NOTE — Telephone Encounter (Signed)
Please see other note by Rexene Edison, NP. Wixella rx sent.

## 2022-10-27 ENCOUNTER — Inpatient Hospital Stay (HOSPITAL_COMMUNITY)
Admission: EM | Admit: 2022-10-27 | Discharge: 2022-11-03 | DRG: 354 | Disposition: A | Discharge status: Home / Self Care | Payer: Medicare HMO | Attending: Internal Medicine | Admitting: Internal Medicine

## 2022-10-27 ENCOUNTER — Telehealth (HOSPITAL_BASED_OUTPATIENT_CLINIC_OR_DEPARTMENT_OTHER): Payer: Self-pay | Admitting: *Deleted

## 2022-10-27 ENCOUNTER — Emergency Department (HOSPITAL_COMMUNITY): Payer: Medicare HMO

## 2022-10-27 ENCOUNTER — Encounter: Payer: Self-pay | Admitting: Cardiology

## 2022-10-27 ENCOUNTER — Encounter (HOSPITAL_COMMUNITY)
Admission: RE | Admit: 2022-10-27 | Discharge: 2022-10-27 | Disposition: A | Discharge status: Home / Self Care | Payer: Medicare HMO | Source: Ambulatory Visit | Attending: Surgery | Admitting: Surgery

## 2022-10-27 ENCOUNTER — Other Ambulatory Visit: Payer: Self-pay

## 2022-10-27 ENCOUNTER — Encounter (HOSPITAL_COMMUNITY): Payer: Self-pay

## 2022-10-27 ENCOUNTER — Encounter (HOSPITAL_BASED_OUTPATIENT_CLINIC_OR_DEPARTMENT_OTHER): Payer: Self-pay | Admitting: *Deleted

## 2022-10-27 DIAGNOSIS — E039 Hypothyroidism, unspecified: Secondary | ICD-10-CM | POA: Diagnosis not present

## 2022-10-27 DIAGNOSIS — K460 Unspecified abdominal hernia with obstruction, without gangrene: Secondary | ICD-10-CM | POA: Diagnosis present

## 2022-10-27 DIAGNOSIS — Z66 Do not resuscitate: Secondary | ICD-10-CM | POA: Diagnosis not present

## 2022-10-27 DIAGNOSIS — E785 Hyperlipidemia, unspecified: Secondary | ICD-10-CM | POA: Diagnosis present

## 2022-10-27 DIAGNOSIS — R112 Nausea with vomiting, unspecified: Secondary | ICD-10-CM | POA: Diagnosis not present

## 2022-10-27 DIAGNOSIS — Z6834 Body mass index (BMI) 34.0-34.9, adult: Secondary | ICD-10-CM

## 2022-10-27 DIAGNOSIS — K828 Other specified diseases of gallbladder: Secondary | ICD-10-CM | POA: Diagnosis present

## 2022-10-27 DIAGNOSIS — K429 Umbilical hernia without obstruction or gangrene: Secondary | ICD-10-CM | POA: Diagnosis not present

## 2022-10-27 DIAGNOSIS — I130 Hypertensive heart and chronic kidney disease with heart failure and stage 1 through stage 4 chronic kidney disease, or unspecified chronic kidney disease: Secondary | ICD-10-CM | POA: Diagnosis present

## 2022-10-27 DIAGNOSIS — K420 Umbilical hernia with obstruction, without gangrene: Principal | ICD-10-CM | POA: Diagnosis present

## 2022-10-27 DIAGNOSIS — Z888 Allergy status to other drugs, medicaments and biological substances status: Secondary | ICD-10-CM

## 2022-10-27 DIAGNOSIS — R188 Other ascites: Secondary | ICD-10-CM | POA: Diagnosis not present

## 2022-10-27 DIAGNOSIS — T500X5A Adverse effect of mineralocorticoids and their antagonists, initial encounter: Secondary | ICD-10-CM | POA: Diagnosis not present

## 2022-10-27 DIAGNOSIS — K219 Gastro-esophageal reflux disease without esophagitis: Secondary | ICD-10-CM | POA: Diagnosis present

## 2022-10-27 DIAGNOSIS — Z79890 Hormone replacement therapy (postmenopausal): Secondary | ICD-10-CM | POA: Diagnosis not present

## 2022-10-27 DIAGNOSIS — C7989 Secondary malignant neoplasm of other specified sites: Secondary | ICD-10-CM | POA: Diagnosis not present

## 2022-10-27 DIAGNOSIS — K660 Peritoneal adhesions (postprocedural) (postinfection): Secondary | ICD-10-CM | POA: Diagnosis present

## 2022-10-27 DIAGNOSIS — I5042 Chronic combined systolic (congestive) and diastolic (congestive) heart failure: Secondary | ICD-10-CM | POA: Diagnosis present

## 2022-10-27 DIAGNOSIS — I080 Rheumatic disorders of both mitral and aortic valves: Secondary | ICD-10-CM | POA: Diagnosis not present

## 2022-10-27 DIAGNOSIS — C7889 Secondary malignant neoplasm of other digestive organs: Secondary | ICD-10-CM | POA: Diagnosis not present

## 2022-10-27 DIAGNOSIS — K5730 Diverticulosis of large intestine without perforation or abscess without bleeding: Secondary | ICD-10-CM | POA: Diagnosis not present

## 2022-10-27 DIAGNOSIS — E875 Hyperkalemia: Secondary | ICD-10-CM | POA: Diagnosis present

## 2022-10-27 DIAGNOSIS — K8050 Calculus of bile duct without cholangitis or cholecystitis without obstruction: Secondary | ICD-10-CM | POA: Diagnosis not present

## 2022-10-27 DIAGNOSIS — E861 Hypovolemia: Secondary | ICD-10-CM | POA: Diagnosis not present

## 2022-10-27 DIAGNOSIS — E860 Dehydration: Secondary | ICD-10-CM | POA: Diagnosis not present

## 2022-10-27 DIAGNOSIS — N1832 Chronic kidney disease, stage 3b: Secondary | ICD-10-CM | POA: Diagnosis present

## 2022-10-27 DIAGNOSIS — I509 Heart failure, unspecified: Secondary | ICD-10-CM | POA: Diagnosis not present

## 2022-10-27 DIAGNOSIS — R1084 Generalized abdominal pain: Secondary | ICD-10-CM | POA: Diagnosis not present

## 2022-10-27 DIAGNOSIS — L89891 Pressure ulcer of other site, stage 1: Secondary | ICD-10-CM | POA: Diagnosis present

## 2022-10-27 DIAGNOSIS — I10 Essential (primary) hypertension: Secondary | ICD-10-CM | POA: Diagnosis not present

## 2022-10-27 DIAGNOSIS — Z79899 Other long term (current) drug therapy: Secondary | ICD-10-CM

## 2022-10-27 DIAGNOSIS — D638 Anemia in other chronic diseases classified elsewhere: Secondary | ICD-10-CM | POA: Diagnosis not present

## 2022-10-27 DIAGNOSIS — K8010 Calculus of gallbladder with chronic cholecystitis without obstruction: Secondary | ICD-10-CM | POA: Diagnosis not present

## 2022-10-27 DIAGNOSIS — Z90710 Acquired absence of both cervix and uterus: Secondary | ICD-10-CM

## 2022-10-27 DIAGNOSIS — R0602 Shortness of breath: Secondary | ICD-10-CM | POA: Diagnosis not present

## 2022-10-27 DIAGNOSIS — E871 Hypo-osmolality and hyponatremia: Secondary | ICD-10-CM | POA: Diagnosis present

## 2022-10-27 DIAGNOSIS — Z950 Presence of cardiac pacemaker: Secondary | ICD-10-CM | POA: Diagnosis not present

## 2022-10-27 DIAGNOSIS — C786 Secondary malignant neoplasm of retroperitoneum and peritoneum: Secondary | ICD-10-CM | POA: Diagnosis not present

## 2022-10-27 DIAGNOSIS — C801 Malignant (primary) neoplasm, unspecified: Secondary | ICD-10-CM | POA: Diagnosis not present

## 2022-10-27 DIAGNOSIS — I4891 Unspecified atrial fibrillation: Secondary | ICD-10-CM | POA: Diagnosis present

## 2022-10-27 DIAGNOSIS — Z881 Allergy status to other antibiotic agents status: Secondary | ICD-10-CM

## 2022-10-27 DIAGNOSIS — K811 Chronic cholecystitis: Secondary | ICD-10-CM | POA: Diagnosis not present

## 2022-10-27 DIAGNOSIS — R109 Unspecified abdominal pain: Secondary | ICD-10-CM | POA: Diagnosis not present

## 2022-10-27 DIAGNOSIS — Z7901 Long term (current) use of anticoagulants: Secondary | ICD-10-CM

## 2022-10-27 DIAGNOSIS — Z87891 Personal history of nicotine dependence: Secondary | ICD-10-CM

## 2022-10-27 DIAGNOSIS — Z8249 Family history of ischemic heart disease and other diseases of the circulatory system: Secondary | ICD-10-CM

## 2022-10-27 DIAGNOSIS — I110 Hypertensive heart disease with heart failure: Secondary | ICD-10-CM | POA: Diagnosis not present

## 2022-10-27 HISTORY — DX: Presence of cardiac pacemaker: Z95.0

## 2022-10-27 LAB — COMPREHENSIVE METABOLIC PANEL
ALT: 9 U/L (ref 0–44)
ALT: 9 U/L (ref 0–44)
AST: 16 U/L (ref 15–41)
AST: 17 U/L (ref 15–41)
Albumin: 3.4 g/dL — ABNORMAL LOW (ref 3.5–5.0)
Albumin: 3.1 g/dL — ABNORMAL LOW (ref 3.5–5.0)
Alkaline Phosphatase: 74 U/L (ref 38–126)
Alkaline Phosphatase: 71 U/L (ref 38–126)
Anion gap: 10 (ref 5–15)
Anion gap: 11 (ref 5–15)
BUN: 13 mg/dL (ref 8–23)
BUN: 13 mg/dL (ref 8–23)
CO2: 21 mmol/L — ABNORMAL LOW (ref 22–32)
CO2: 20 mmol/L — ABNORMAL LOW (ref 22–32)
Calcium: 8.8 mg/dL — ABNORMAL LOW (ref 8.9–10.3)
Calcium: 8.5 mg/dL — ABNORMAL LOW (ref 8.9–10.3)
Chloride: 96 mmol/L — ABNORMAL LOW (ref 98–111)
Chloride: 97 mmol/L — ABNORMAL LOW (ref 98–111)
Creatinine, Ser: 1.35 mg/dL — ABNORMAL HIGH (ref 0.44–1.00)
Creatinine, Ser: 1.34 mg/dL — ABNORMAL HIGH (ref 0.44–1.00)
GFR, Estimated: 38 mL/min — ABNORMAL LOW (ref >60)
GFR, Estimated: 38 mL/min — ABNORMAL LOW (ref >60)
Glucose, Bld: 102 mg/dL — ABNORMAL HIGH (ref 70–99)
Glucose, Bld: 91 mg/dL (ref 70–99)
Potassium: 5.3 mmol/L — ABNORMAL HIGH (ref 3.5–5.1)
Potassium: 4.5 mmol/L (ref 3.5–5.1)
Sodium: 127 mmol/L — ABNORMAL LOW (ref 135–145)
Sodium: 128 mmol/L — ABNORMAL LOW (ref 135–145)
Total Bilirubin: 0.6 mg/dL (ref 0.3–1.2)
Total Bilirubin: 0.7 mg/dL (ref 0.3–1.2)
Total Protein: 7 g/dL (ref 6.5–8.1)
Total Protein: 6.8 g/dL (ref 6.5–8.1)

## 2022-10-27 LAB — CBC
HCT: 38.9 % (ref 36.0–46.0)
Hemoglobin: 12.6 g/dL (ref 12.0–15.0)
MCH: 28.3 pg (ref 26.0–34.0)
MCHC: 32.4 g/dL (ref 30.0–36.0)
MCV: 87.4 fL (ref 80.0–100.0)
Platelets: 287 10*3/uL (ref 150–400)
RBC: 4.45 MIL/uL (ref 3.87–5.11)
RDW: 13 % (ref 11.5–15.5)
WBC: 7.4 10*3/uL (ref 4.0–10.5)
nRBC: 0 % (ref 0.0–0.2)

## 2022-10-27 LAB — CBC WITH DIFFERENTIAL/PLATELET
Abs Immature Granulocytes: 0.02 10*3/uL (ref 0.00–0.07)
Basophils Absolute: 0.1 10*3/uL (ref 0.0–0.1)
Basophils Relative: 1 %
Eosinophils Absolute: 0.2 10*3/uL (ref 0.0–0.5)
Eosinophils Relative: 3 %
HCT: 35.9 % — ABNORMAL LOW (ref 36.0–46.0)
Hemoglobin: 11.6 g/dL — ABNORMAL LOW (ref 12.0–15.0)
Immature Granulocytes: 0 %
Lymphocytes Relative: 19 %
Lymphs Abs: 1.4 10*3/uL (ref 0.7–4.0)
MCH: 28.1 pg (ref 26.0–34.0)
MCHC: 32.3 g/dL (ref 30.0–36.0)
MCV: 86.9 fL (ref 80.0–100.0)
Monocytes Absolute: 0.7 10*3/uL (ref 0.1–1.0)
Monocytes Relative: 10 %
Neutro Abs: 5 10*3/uL (ref 1.7–7.7)
Neutrophils Relative %: 67 %
Platelets: 255 10*3/uL (ref 150–400)
RBC: 4.13 MIL/uL (ref 3.87–5.11)
RDW: 12.9 % (ref 11.5–15.5)
WBC: 7.3 10*3/uL (ref 4.0–10.5)
nRBC: 0 % (ref 0.0–0.2)

## 2022-10-27 LAB — URINALYSIS, COMPLETE (UACMP) WITH MICROSCOPIC
Bacteria, UA: NONE SEEN
Bilirubin Urine: NEGATIVE
Glucose, UA: NEGATIVE mg/dL
Hgb urine dipstick: NEGATIVE
Ketones, ur: 5 mg/dL — AB
Nitrite: NEGATIVE
Protein, ur: NEGATIVE mg/dL
Specific Gravity, Urine: 1.028 (ref 1.005–1.030)
pH: 7 (ref 5.0–8.0)

## 2022-10-27 LAB — I-STAT CHEM 8, ED
BUN: 16 mg/dL (ref 8–23)
Calcium, Ion: 1.08 mmol/L — ABNORMAL LOW (ref 1.15–1.40)
Chloride: 95 mmol/L — ABNORMAL LOW (ref 98–111)
Creatinine, Ser: 1.3 mg/dL — ABNORMAL HIGH (ref 0.44–1.00)
Glucose, Bld: 101 mg/dL — ABNORMAL HIGH (ref 70–99)
HCT: 40 % (ref 36.0–46.0)
Hemoglobin: 13.6 g/dL (ref 12.0–15.0)
Potassium: 5.6 mmol/L — ABNORMAL HIGH (ref 3.5–5.1)
Sodium: 127 mmol/L — ABNORMAL LOW (ref 135–145)
TCO2: 22 mmol/L (ref 22–32)

## 2022-10-27 LAB — LIPASE, BLOOD: Lipase: 27 U/L (ref 11–51)

## 2022-10-27 LAB — LACTIC ACID, PLASMA: Lactic Acid, Venous: 1.3 mmol/L (ref 0.5–1.9)

## 2022-10-27 LAB — MAGNESIUM: Magnesium: 2 mg/dL (ref 1.7–2.4)

## 2022-10-27 LAB — PHOSPHORUS: Phosphorus: 2.8 mg/dL (ref 2.5–4.6)

## 2022-10-27 MED ORDER — METOPROLOL TARTRATE 5 MG/5ML IV SOLN
5.0000 mg | Freq: Three times a day (TID) | INTRAVENOUS | Status: DC
  Administered 2022-10-30 – 2022-11-01 (×5): 5 mg via INTRAVENOUS
  Filled 2022-10-27 (×9): qty 5

## 2022-10-27 MED ORDER — LORAZEPAM 2 MG/ML IJ SOLN
0.5000 mg | Freq: Two times a day (BID) | INTRAMUSCULAR | Status: DC | PRN
  Administered 2022-10-28 – 2022-11-02 (×5): 0.5 mg via INTRAVENOUS
  Filled 2022-10-27 (×5): qty 1

## 2022-10-27 MED ORDER — POLYETHYLENE GLYCOL 3350 17 G PO PACK: 17.0000 g | PACK | Freq: Every day | ORAL | Status: DC | PRN

## 2022-10-27 MED ORDER — ACETAMINOPHEN 325 MG PO TABS
650.0000 mg | ORAL_TABLET | Freq: Four times a day (QID) | ORAL | Status: DC | PRN
  Administered 2022-10-29 (×2): 650 mg via ORAL
  Filled 2022-10-27 (×2): qty 2

## 2022-10-27 MED ORDER — ONDANSETRON 4 MG PO TBDP
4.0000 mg | ORAL_TABLET | Freq: Once | ORAL | Status: DC
  Filled 2022-10-27: qty 1

## 2022-10-27 MED ORDER — SORBITOL 70 % SOLN: 30.0000 mL | Freq: Every day | Status: DC | PRN

## 2022-10-27 MED ORDER — MOMETASONE FURO-FORMOTEROL FUM 200-5 MCG/ACT IN AERO
2.0000 | INHALATION_SPRAY | Freq: Two times a day (BID) | RESPIRATORY_TRACT | Status: DC
  Administered 2022-10-28 – 2022-11-03 (×12): 2 via RESPIRATORY_TRACT
  Filled 2022-10-27: qty 8.8

## 2022-10-27 MED ORDER — HYDRALAZINE HCL 20 MG/ML IJ SOLN: 5.0000 mg | Freq: Four times a day (QID) | INTRAMUSCULAR | Status: DC | PRN

## 2022-10-27 MED ORDER — ONDANSETRON HCL 4 MG/2ML IJ SOLN
4.0000 mg | Freq: Four times a day (QID) | INTRAMUSCULAR | Status: DC | PRN
  Administered 2022-10-27 – 2022-10-30 (×8): 4 mg via INTRAVENOUS
  Filled 2022-10-27 (×8): qty 2

## 2022-10-27 MED ORDER — SODIUM CHLORIDE 0.9 % IV BOLUS
1000.0000 mL | Freq: Once | INTRAVENOUS | Status: AC
  Administered 2022-10-27: 1000 mL via INTRAVENOUS

## 2022-10-27 MED ORDER — SODIUM ZIRCONIUM CYCLOSILICATE 10 G PO PACK: 10.0000 g | PACK | Freq: Three times a day (TID) | ORAL | Status: DC

## 2022-10-27 MED ORDER — LEVOTHYROXINE SODIUM 100 MCG/5ML IV SOLN: 112.0000 ug | Freq: Every day | INTRAVENOUS | Status: DC

## 2022-10-27 MED ORDER — DEXTROSE-NACL 5-0.9 % IV SOLN: INTRAVENOUS | Status: DC

## 2022-10-27 MED ORDER — HYDROMORPHONE HCL 1 MG/ML IJ SOLN
0.5000 mg | INTRAMUSCULAR | Status: DC | PRN
  Administered 2022-10-27: 1 mg via INTRAVENOUS
  Administered 2022-10-28: 0.5 mg via INTRAVENOUS
  Administered 2022-10-30 – 2022-10-31 (×3): 1 mg via INTRAVENOUS
  Filled 2022-10-27 (×5): qty 1

## 2022-10-27 MED ORDER — ONDANSETRON HCL 4 MG/2ML IJ SOLN
4.0000 mg | Freq: Once | INTRAMUSCULAR | Status: AC
  Administered 2022-10-27: 4 mg via INTRAVENOUS
  Filled 2022-10-27: qty 2

## 2022-10-27 MED ORDER — IOHEXOL 300 MG/ML  SOLN
100.0000 mL | Freq: Once | INTRAMUSCULAR | Status: AC | PRN
  Administered 2022-10-27: 100 mL via INTRAVENOUS

## 2022-10-27 MED ORDER — ALBUTEROL SULFATE (2.5 MG/3ML) 0.083% IN NEBU: 2.5000 mg | INHALATION_SOLUTION | RESPIRATORY_TRACT | Status: DC | PRN

## 2022-10-27 MED ORDER — FENTANYL CITRATE PF 50 MCG/ML IJ SOSY
50.0000 ug | PREFILLED_SYRINGE | Freq: Once | INTRAMUSCULAR | Status: AC
  Administered 2022-10-27: 50 ug via INTRAVENOUS
  Filled 2022-10-27: qty 1

## 2022-10-27 MED ORDER — PANTOPRAZOLE SODIUM 40 MG IV SOLR
40.0000 mg | INTRAVENOUS | Status: DC
  Administered 2022-10-28 – 2022-10-30 (×3): 40 mg via INTRAVENOUS
  Filled 2022-10-27 (×3): qty 10

## 2022-10-27 MED ORDER — LORAZEPAM 0.5 MG PO TABS
0.5000 mg | ORAL_TABLET | Freq: Once | ORAL | Status: DC
  Filled 2022-10-27: qty 1

## 2022-10-27 MED ORDER — KETOCONAZOLE 2 % EX CREA: 1.0000 | TOPICAL_CREAM | Freq: Two times a day (BID) | CUTANEOUS | Status: DC | PRN

## 2022-10-27 MED ORDER — ACETAMINOPHEN 650 MG RE SUPP: 650.0000 mg | Freq: Four times a day (QID) | RECTAL | Status: DC | PRN

## 2022-10-27 MED ORDER — LACTATED RINGERS IV BOLUS: 1000.0000 mL | Freq: Once | INTRAVENOUS | Status: DC

## 2022-10-27 MED ORDER — AMIODARONE HCL 200 MG PO TABS
200.0000 mg | ORAL_TABLET | Freq: Every day | ORAL | Status: DC
  Administered 2022-10-28 – 2022-11-03 (×7): 200 mg via ORAL
  Filled 2022-10-27 (×7): qty 1

## 2022-10-27 MED ORDER — ONDANSETRON HCL 4 MG PO TABS: 4.0000 mg | ORAL_TABLET | Freq: Four times a day (QID) | ORAL | Status: DC | PRN

## 2022-10-27 NOTE — H&P (Signed)
History and Physical    [name] JOA:[id number] DOB: Aug 05, 1931 DOA: 10/27/2022  PCP: Merrilee Seashore, MD  Patient coming from: Home  I have personally briefly reviewed patient's old medical records in [place]  Chief Complaint: Intractable nausea vomiting worsening abdominal pain x 2 to 3 days  HPI: [name] is a 89 y.o. female with medical history significant of chronic combined syst and diastolic CHF, HTN, CKD stage III, Afib s/p PPM, GERD, hx of incarcerated umbilical hernia who was to have repair of hernia by general surgery, Dr. Kae Heller on Friday, 10/31/2022 who had presented to preop testing and noted to have had intractable nausea vomiting and worsening periumbilical and right lower quadrant abdominal pain over the past 2 to 3 days with some associated shortness of breath, diarrhea, lightheadedness, right flank pain in addition.  Patient denies any fevers, no chills, no chest pain, no constipation, no melena, no hematemesis, no hematochezia.  Patient seen at preop testing, call was made to general surgery's office and patient advised to present to the ED.  Patient noted to have been seen in the ED on 10/07/2022 with similar symptoms.  ED Course: Patient seen in the ED, comprehensive metabolic profile obtained with a sodium of 127, potassium of 5.3, chloride of 96, glucose of 102, creatinine of 1.35, albumin of 3.4 otherwise within normal limits.  Lactic acid level noted at 1.3.  CBC unremarkable.  Repeat CT abdomen and pelvis done with moderate fat-containing periumbilical hernia is not substantially changed with persistent increased density, favored to reflect marked fat stranding rather than underlying soft tissue lesion.  Slightly increased small volume free fluid and mesenteric stranding nonspecific may be reactive, small hiatal hernia, colonic diverticulosis without acute diverticulitis, aortic atherosclerosis.  ED PA spoke with general surgery who had recommended  medical admission and general surgery will consult in the morning.  Review of Systems: As per HPI otherwise all other systems reviewed and are negative.  Past Medical History:  Diagnosis Date   Chronic combined systolic and diastolic heart failure ([place]) 08/26/2022   Dyspnea    diastolic dysfunction by echo 2012   GERD (gastroesophageal reflux disease)    Hypertension    Mild mitral and aortic regurgitation    Osteoarthritis    Right bundle branch block 10/06/2012   Thyroid disease     Past Surgical History:  Procedure Laterality Date   arm surgery     following an accident   CARDIOVERSION N/A 12/11/2021   Procedure: CARDIOVERSION;  Surgeon: Werner Lean, MD;  Location: [facility] ENDOSCOPY;  Service: Cardiovascular;  Laterality: N/A;   CARDIOVERSION N/A 05/22/2022   Procedure: CARDIOVERSION;  Surgeon: [name], MD;  Location: [facility];  Service: Cardiovascular;  Laterality: N/A;   KNEE SURGERY     Following an accident   PACEMAKER IMPLANT N/A 05/27/2022   Procedure: PACEMAKER IMPLANT;  Surgeon: Constance Haw, MD;  Location: [facility] CV LAB;  Service: Cardiovascular;  Laterality: N/A;   TEE WITHOUT CARDIOVERSION N/A 12/11/2021   Procedure: TRANSESOPHAGEAL ECHOCARDIOGRAM (TEE);  Surgeon: Werner Lean, MD;  Location: [facility] ENDOSCOPY;  Service: Cardiovascular;  Laterality: N/A;   TONSILLECTOMY AND ADENOIDECTOMY     VAGINAL HYSTERECTOMY      Social History  reports that she has never smoked. She has never used smokeless tobacco. She reports that she does not drink alcohol and does not use drugs.  Allergies  Allergen Reactions   Atropine Other (See Comments)    Made her entire  body jump after they gave it to her.   Fluoxetine Other (See Comments)    Other reaction(s): hyponatremia (low Blood Sodium)   Serotonin Reuptake Inhibitors (Ssris)     Other reaction(s): hyponatremia   Keflex [Cephalexin] Rash    Daughter reports severe reaction specifically  with keflex, Neither can remember what it was currently. Tolerated Cefdinir 05/2022    Family History  Problem Relation Age of Onset   Heart attack Mother    Heart attack Father      Prior to Admission medications   Medication Sig Start Date End Date Taking? Authorizing Provider  acetaminophen (TYLENOL) 500 MG tablet Take 1,000 mg by mouth every 6 (six) hours as needed for mild pain.    [provider]  albuterol (VENTOLIN HFA) 108 (90 Base) MCG/ACT inhaler Inhale 1 puff into the lungs every 6 (six) hours as needed for wheezing or shortness of breath. 01/14/22   [provider]  amiodarone (PACERONE) 200 MG tablet Take 1 tablet (200 mg total) by mouth daily. 03/13/22   Loel Dubonnet, NP  apixaban (ELIQUIS) 5 MG TABS tablet Take 1 tablet (5 mg total) by mouth 2 (two) times daily. 09/25/22   [name], MD  benzonatate (TESSALON PERLES) 100 MG capsule Take 1 capsule (100 mg total) by mouth 3 (three) times daily as needed for cough. 01/14/22   Loel Dubonnet, NP  carboxymethylcellulose (REFRESH PLUS) 0.5 % SOLN Place 1 drop into both eyes daily as needed (dry eyes).    [provider]  Cholecalciferol (VITAMIN D) 50 MCG (2000 UT) CAPS Take 2,000 Units by mouth daily.    [provider]  cyanocobalamin (VITAMIN B12) 1000 MCG tablet Take 2,000 mcg by mouth daily.    [provider]  famotidine (PEPCID) 20 MG tablet Take 1 tablet (20 mg total) by mouth at bedtime. 10/23/22   Freddi Starr, MD  fluticasone-salmeterol (WIXELA INHUB) 100-50 MCG/ACT AEPB Inhale 1 puff into the lungs 2 (two) times daily. 10/24/22   Parrett, Fonnie Mu, NP  furosemide (LASIX) 20 MG tablet Take '40mg'$  (2 tablets) daily. May take additional '20mg'$  (1 tablet) as needed for weight gain of 2 pounds overnight or 5 pounds in one week Patient taking differently: Take 20 mg by mouth daily. May take additional '20mg'$  (1 tablet) as needed for weight gain of 2 pounds overnight or 5  pounds in one week 03/13/22   Loel Dubonnet, NP  HYDROcodone-acetaminophen (NORCO) 5-325 MG tablet Take 1 tablet by mouth every 6 (six) hours as needed for moderate pain. 10/07/22   Small, Brooke L, PA  ipratropium (ATROVENT) 0.06 % nasal spray Place 2 sprays into both nostrils 3 (three) times daily. 09/11/22   [provider]  ketoconazole (NIZORAL) 2 % cream Apply 1 Application topically 2 (two) times daily as needed for rash. 03/04/22   [provider]  levothyroxine (SYNTHROID) 200 MCG tablet Take 200 mcg by mouth See admin instructions. Take with 25 mcg for a total of 225 mg with breakfast    [provider]  levothyroxine (SYNTHROID) 25 MCG tablet Take 25 mcg by mouth See admin instructions. Take with 200 mg for a total of 225 mg with breakfast    [provider]  levothyroxine (SYNTHROID) 75 MCG tablet Take 3 tablets (225 mcg total) by mouth daily at 6 (six) AM. Patient not taking: Reported on 10/24/2022 05/29/22 10/24/22  Arrien, Jimmy Picket, MD  LORazepam (ATIVAN) 0.5 MG tablet Take 0.5 mg  by mouth at bedtime.    [provider]  lovastatin (MEVACOR) 40 MG tablet Take 40 mg by mouth at bedtime.    [provider]  metoprolol succinate (TOPROL-XL) 25 MG 24 hr tablet Take 0.5 tablets (12.5 mg total) by mouth daily. 10/02/22   Loel Dubonnet, NP  Multiple Vitamins-Minerals (EYE VITAMINS) CAPS Take 1 capsule by mouth daily.    [provider]  omeprazole (PRILOSEC) 10 MG capsule Take 10 mg by mouth every morning. 10/16/21   [provider]  ondansetron (ZOFRAN) 4 MG tablet Take 1 tablet (4 mg total) by mouth every 6 (six) hours. Patient taking differently: Take 4 mg by mouth every 6 (six) hours as needed for nausea or vomiting. 10/07/22   Small, Brooke L, PA  polyethylene glycol (MIRALAX) 17 g packet Take 17 g by mouth daily. Patient taking differently: Take 17 g by mouth daily as needed for mild constipation or moderate  constipation. 10/07/22   Small, Brooke L, PA  potassium chloride SA (KLOR-CON M) 20 MEQ tablet Take 1 tablet (20 mEq total) by mouth daily. 01/09/22   [name], MD  sacubitril-valsartan (ENTRESTO) 24-26 MG Take 1 tablet by mouth 2 (two) times daily. 09/25/22   [name], MD  spironolactone (ALDACTONE) 25 MG tablet Take 0.5 tablets (12.5 mg total) by mouth daily. 06/06/22 06/01/23  Loel Dubonnet, NP  thiamine (VITAMIN B-1) 50 MG tablet Take 25 mg by mouth daily.    [provider]    Physical Exam: Vitals:   10/27/22 1332 10/27/22 1615 10/27/22 1709 10/27/22 1715  BP:  (!) 125/54 118/70   Pulse:  (!) 59 (!) 59   Resp:  19 18   Temp:    98 F (36.7 C)  TempSrc:    Oral  SpO2:  95% 100%   Weight: 86.6 kg     Height: '5\' 6"'$  (1.676 m)       Constitutional: NAD, calm, comfortable Vitals:   10/27/22 1332 10/27/22 1615 10/27/22 1709 10/27/22 1715  BP:  (!) 125/54 118/70   Pulse:  (!) 59 (!) 59   Resp:  19 18   Temp:    98 F (36.7 C)  TempSrc:    Oral  SpO2:  95% 100%   Weight: 86.6 kg     Height: '5\' 6"'$  (1.676 m)      Eyes: PERRL, lids and conjunctivae normal ENMT: Mucous membranes are dry. Posterior pharynx clear of any exudate or lesions.Normal dentition.  Neck: normal, supple, no masses, no thyromegaly Respiratory: clear to auscultation bilaterally, no wheezing, no crackles. Normal respiratory effort. No accessory muscle use.  Cardiovascular: Regular rate and rhythm, no murmurs / rubs / gallops. No extremity edema. 2+ pedal pulses. No carotid bruits.  Abdomen: Soft, tender to palpation in the right lower quadrant, periumbilical region.  Umbilical hernia noted to be hard and nonreducible.  Positive bowel sounds.  No rebound.  Some guarding.   Musculoskeletal: no clubbing / cyanosis. No joint deformity upper and lower extremities. Good ROM, no contractures. Normal muscle tone.  Skin: no rashes, lesions, ulcers. No induration Neurologic: CN 2-12 grossly intact.  Sensation intact, DTR normal. Strength 5/5 in all 4.  Psychiatric: Normal judgment and insight. Alert and oriented x 3. Normal mood.    Labs on Admission: I have personally reviewed following labs and imaging studies  CBC: Recent Labs  Lab 10/27/22 1420 10/27/22 1437  WBC 7.4  --   HGB 12.6 13.6  HCT  38.9 40.0  MCV 87.4  --   PLT 287  --     Basic Metabolic Panel: Recent Labs  Lab 10/27/22 1420 10/27/22 1437  NA 127* 127*  K 5.3* 5.6*  CL 96* 95*  CO2 21*  --   GLUCOSE 102* 101*  BUN 13 16  CREATININE 1.35* 1.30*  CALCIUM 8.8*  --     GFR: Estimated Creatinine Clearance: 32.5 mL/min (A) (by C-G formula based on SCr of 1.3 mg/dL (H)).  Liver Function Tests: Recent Labs  Lab 10/27/22 1420  AST 16  ALT 9  ALKPHOS 74  BILITOT 0.6  PROT 7.0  ALBUMIN 3.4*    Urine analysis:    Component Value Date/Time   COLORURINE YELLOW 10/07/2022 1252   APPEARANCEUR CLEAR 10/07/2022 1252   APPEARANCEUR Clear 01/08/2022 1554   LABSPEC 1.008 10/07/2022 1252   PHURINE 5.5 10/07/2022 [place] 10/07/2022 1252   [place] 10/07/2022 1252   BILIRUBINUR NEGATIVE 10/07/2022 1252   BILIRUBINUR Negative 01/08/2022 [place] 10/07/2022 1252   PROTEINUR NEGATIVE 10/07/2022 1252   NITRITE NEGATIVE 10/07/2022 1252   LEUKOCYTESUR NEGATIVE 10/07/2022 1252    Radiological Exams on Admission: CT ABDOMEN PELVIS W CONTRAST  Result Date: 10/27/2022 CLINICAL DATA:  Increased periumbilical pain, nausea, and vomiting in the setting of known hernia EXAM: CT ABDOMEN AND PELVIS WITH CONTRAST TECHNIQUE: Multidetector CT imaging of the abdomen and pelvis was performed using the standard protocol following bolus administration of intravenous contrast. RADIATION DOSE REDUCTION: This exam was performed according to the departmental dose-optimization program which includes automated exposure control, adjustment of the mA and/or kV according to patient size and/or  use of iterative reconstruction technique. CONTRAST:  13m OMNIPAQUE IOHEXOL 300 MG/ML  SOLN COMPARISON:  CT abdomen and pelvis dated 10/07/2022 FINDINGS: Lower chest: No focal consolidation or pulmonary nodule in the lung bases. No pleural effusion or pneumothorax demonstrated. Partially imaged heart size is normal. Partially imaged pacemaker leads terminate in the right atrium and ventricle. Hepatobiliary: Subcentimeter hypodensity in segment 4 (2:22), unchanged, is too small to characterize. No intra or extrahepatic biliary ductal dilation. Normal gallbladder. Pancreas: No focal lesions or main ductal dilation. Spleen: Normal in size without focal abnormality. Adrenals/Urinary Tract: No adrenal nodules. No suspicious renal mass, calculi or hydronephrosis. Unchanged simple fluid attenuation focus adjacent to the right kidney measuring 5.2 cm (2:39). No focal bladder wall thickening. Stomach/Bowel: Small hiatal hernia. Normal appearance of the stomach. No evidence of bowel wall thickening, distention, or inflammatory changes. Colonic diverticulosis without acute diverticulitis. Appendectomy. Vascular/Lymphatic: Aortic atherosclerosis. Circumaortic left renal vein. No enlarged abdominal or pelvic lymph nodes. Reproductive: No adnexal masses. Other: Slightly increased small volume free fluid and mesenteric stranding. Free air. Moderate fat-containing paraumbilical hernia is not substantially changed with persistent increased density. Musculoskeletal: No acute or abnormal lytic or blastic osseous lesions. IMPRESSION: 1. Moderate fat-containing paraumbilical hernia is not substantially changed with persistent increased density, favored to reflect marked fat stranding rather than underlying soft tissue lesion. 2. Slightly increased small volume free fluid and mesenteric stranding, nonspecific, may be reactive. 3. Small hiatal hernia. 4. Colonic diverticulosis without acute diverticulitis. 5.  Aortic Atherosclerosis  (ICD10-I70.0). Electronically Signed   By: LDarrin NipperM.D.   On: 10/27/2022 15:51    EKG: Not done.  Assessment/Plan Principal Problem:   Intractable nausea and vomiting Active Problems:   Incarcerated hernia   Atrial fibrillation with RVR (HCC)   Hyponatremia   Essential hypertension  Hypothyroidism   HLD (hyperlipidemia)   Chronic combined systolic and diastolic heart failure (HCC)   Dehydration   Hyperkalemia   Umbilical hernia without obstruction or gangrene    #1 intractable nausea and vomiting likely secondary to incarcerated umbilical hernia -Patient presented with intractable nausea vomiting periumbilical pain and right lower quadrant pain worsening over the past 2 to 3 days with inability to keep anything down. -Patient with significant abdominal pain, right lower quadrant on exam, [place] umbilical hernia which is not reducible. -CT abdomen and pelvis done with moderate fat-containing periumbilical hernia is not substantially changed with persistent increased density, favored to reflect marked fat stranding rather than underlying soft tissue lesion. -Place patient on bowel rest, admitted to progressive care unit for closer monitoring. -Hydration with D5 normal saline at 100 cc an hour, IV antiemetics, IV pain medication, supportive care. -Consult with general surgery for further evaluation and management.  2.  Hyponatremia -Likely secondary to hypovolemic hyponatremia in the setting of intractable nausea and vomiting x 2 to 3 days, diuretics of Lasix, spironolactone, Entresto. -Hold antihypertensive medications of spironolactone and Entresto and Lasix. -IV fluids.  3.  Hyperkalemia -Likely secondary to spironolactone, Entresto. -Repeat labs. -If potassium still elevated will place on Lokelma.  4.  Hypothyroidism Placed on home regimen Synthroid '1 1 2 '$ mcg IV daily.  5.  A-fib status post PPM -Resume home regimen amiodarone.  Hold Toprol-XL and placed on Lopressor 5  mg IV every 8 hours.  6.  GERD -IV PPI.  7.  Hyperlipidemia -Hold statin.  8.  CKD stage IIIb -Stable.   DVT prophylaxis: SCDs Code Status:   DNR Family Communication:  Updated patient, [name] at bedside. Disposition Plan:   Patient is from:  Home  Anticipated DC to:  Home  Anticipated DC date:  TBD  Anticipated DC barriers: Clinical improvement  Consults called:  General surgery Admission status:  Admit to progressive care/inpatient.  Severity of Illness: The appropriate patient status for this patient is INPATIENT. Inpatient status is judged to be reasonable and necessary in order to provide the required intensity of service to ensure the patient's safety. The patient's presenting symptoms, physical exam findings, and initial radiographic and laboratory data in the context of their chronic comorbidities is felt to place them at high risk for further clinical deterioration. Furthermore, it is not anticipated that the patient will be medically stable for discharge from the hospital within 2 midnights of admission.   * I certify that at the point of admission it is my clinical judgment that the patient will require inpatient hospital care spanning beyond 2 midnights from the point of admission due to high intensity of service, high risk for further deterioration and high frequency of surveillance required.*    Irine Seal MD [facility]  How to contact the [facility] Attending or Consulting provider [place] or covering provider during after hours [place], for this patient?   Check the care team in [facility] and look for a) attending/consulting [facility] provider listed and b) the [facility] team listed Log into [email or web address] and use [place]'s universal password to access. If you do not have the password, please contact the hospital operator. Locate the [facility] provider you are looking for under [facility] and page to a number that you can be directly reached. If you still have  difficulty reaching the provider, please page the [facility] (Director on Call) for the Hospitalists listed on amion for assistance.  10/27/2022, 6:17 PM

## 2022-10-27 NOTE — ED Triage Notes (Signed)
Patient is scheduled for umbilical hernia repair on Friday. But patient is feeling weak, vomiting, and having pain. The hernia was soft but appears more harder today.

## 2022-10-27 NOTE — ED Provider Triage Note (Signed)
Emergency Medicine Provider Triage Evaluation Note  ADDILEE NEU , a 89 y.o. female  was evaluated in triage.  Pt complains of abdominal pain with NVD, worsened over the past few days. Was going into to pre-op today for hernia surgery on Friday, but was told to come here instead.   Review of Systems  Positive:  Negative:  Physical Exam  BP (!) 108/55 (BP Location: Right Arm)   Pulse 65   Temp 97.6 F (36.4 C) (Oral)   Resp 19   Ht '5\' 6"'$  (1.676 m)   Wt 86.6 kg   SpO2 100%   BMI 30.83 kg/m  Gen:   Awake, no distress   Resp:  Normal effort  MSK:   Moves extremities without difficulty  Other:  Firm hernia at the umbilicus. Mild overlying warmth and erythema. No reducible.   Medical Decision Making  Medically screening exam initiated at 2:15 PM.  Appropriate orders placed.  Genifer S [place] was informed that the remainder of the evaluation will be completed by another provider, this initial triage assessment does not replace that evaluation, and the importance of remaining in the ED until their evaluation is complete.  Charge nurse alerted that the patient needs a room now. Labs and CT ordered. Concern for incarcerated hernia. Zofran ordered.    Sherrell Puller, PA-C 10/27/22 1418

## 2022-10-27 NOTE — Telephone Encounter (Signed)
Spoke with patient's daughter in regards to this. Nothing further needed.

## 2022-10-27 NOTE — ED Provider Notes (Addendum)
[facility] Provider Note   CSN: [id number] Arrival date & time: 10/27/22  1307     History Chief Complaint  Patient presents with   Abdominal Pain    [name] is a 89 y.o. female with h/o HTN, afib on eliquis, HLD, HCF, pacemaker, presents to the ER for evaluation of worsening abdominal pain with nausea and vomiting.  Patient developed an umbilical hernia at the beginning of January/end of December.  She was seen on 06-13-1190 for the umbilical pain.  She was sent to [place] surgery and was seen on 10-16-2022.  She is to have scheduled umbilical hernia surgery this Friday.  She was seen at preop given her worsening pain and nausea and vomiting, was sent to the ER.  She has tried Zofran but is still having dry heaving and a lot of nausea.  She is having some diarrhea and is still passing gas.  No melena or hematochezia.  She is not really able to tolerate anything p.o.  Patient currently on Eliquis for her A-fib.  Abdominal Pain Associated symptoms: diarrhea, nausea and vomiting   Associated symptoms: no chills and no fever        Home Medications Prior to Admission medications   Medication Sig Start Date End Date Taking? Authorizing Provider  acetaminophen (TYLENOL) 500 MG tablet Take 1,000 mg by mouth every 6 (six) hours as needed for mild pain.    [provider]  albuterol (VENTOLIN HFA) 108 (90 Base) MCG/ACT inhaler Inhale 1 puff into the lungs every 6 (six) hours as needed for wheezing or shortness of breath. 01/14/22   [provider]  amiodarone (PACERONE) 200 MG tablet Take 1 tablet (200 mg total) by mouth daily. 03/13/22   Loel Dubonnet, NP  apixaban (ELIQUIS) 5 MG TABS tablet Take 1 tablet (5 mg total) by mouth 2 (two) times daily. 09/25/22   [name], MD  benzonatate (TESSALON PERLES) 100 MG capsule Take 1 capsule (100 mg total) by mouth 3 (three) times daily as needed for cough. 01/14/22    Loel Dubonnet, NP  carboxymethylcellulose (REFRESH PLUS) 0.5 % SOLN Place 1 drop into both eyes daily as needed (dry eyes).    [provider]  Cholecalciferol (VITAMIN D) 50 MCG (2000 UT) CAPS Take 2,000 Units by mouth daily.    [provider]  cyanocobalamin (VITAMIN B12) 1000 MCG tablet Take 2,000 mcg by mouth daily.    [provider]  famotidine (PEPCID) 20 MG tablet Take 1 tablet (20 mg total) by mouth at bedtime. 10/23/22   Freddi Starr, MD  fluticasone-salmeterol (WIXELA INHUB) 100-50 MCG/ACT AEPB Inhale 1 puff into the lungs 2 (two) times daily. 10/24/22   Parrett, Fonnie Mu, NP  furosemide (LASIX) 20 MG tablet Take '40mg'$  (2 tablets) daily. May take additional '20mg'$  (1 tablet) as needed for weight gain of 2 pounds overnight or 5 pounds in one week Patient taking differently: Take 20 mg by mouth daily. May take additional '20mg'$  (1 tablet) as needed for weight gain of 2 pounds overnight or 5 pounds in one week 03/13/22   Loel Dubonnet, NP  HYDROcodone-acetaminophen (NORCO) 5-325 MG tablet Take 1 tablet by mouth every 6 (six) hours as needed for moderate pain. 10/07/22   Small, Brooke L, PA  ipratropium (ATROVENT) 0.06 % nasal spray Place 2 sprays into both nostrils 3 (three) times daily. 09/11/22   [provider]  ketoconazole (NIZORAL) 2 % cream  Apply 1 Application topically 2 (two) times daily as needed for rash. 03/04/22   [provider]  levothyroxine (SYNTHROID) 200 MCG tablet Take 200 mcg by mouth See admin instructions. Take with 25 mcg for a total of 225 mg with breakfast    [provider]  levothyroxine (SYNTHROID) 25 MCG tablet Take 25 mcg by mouth See admin instructions. Take with 200 mg for a total of 225 mg with breakfast    [provider]  levothyroxine (SYNTHROID) 75 MCG tablet Take 3 tablets (225 mcg total) by mouth daily at 6 (six) AM. Patient not taking: Reported on 10/24/2022 05/29/22 10/24/22  Arrien, Jimmy Picket, MD  LORazepam (ATIVAN) 0.5 MG tablet Take 0.5 mg by mouth at bedtime.    [provider]  lovastatin (MEVACOR) 40 MG tablet Take 40 mg by mouth at bedtime.    [provider]  metoprolol succinate (TOPROL-XL) 25 MG 24 hr tablet Take 0.5 tablets (12.5 mg total) by mouth daily. 10/02/22   Loel Dubonnet, NP  Multiple Vitamins-Minerals (EYE VITAMINS) CAPS Take 1 capsule by mouth daily.    [provider]  omeprazole (PRILOSEC) 10 MG capsule Take 10 mg by mouth every morning. 10/16/21   [provider]  ondansetron (ZOFRAN) 4 MG tablet Take 1 tablet (4 mg total) by mouth every 6 (six) hours. Patient taking differently: Take 4 mg by mouth every 6 (six) hours as needed for nausea or vomiting. 10/07/22   Small, Brooke L, PA  polyethylene glycol (MIRALAX) 17 g packet Take 17 g by mouth daily. Patient taking differently: Take 17 g by mouth daily as needed for mild constipation or moderate constipation. 10/07/22   Small, Brooke L, PA  potassium chloride SA (KLOR-CON M) 20 MEQ tablet Take 1 tablet (20 mEq total) by mouth daily. 01/09/22   [name], MD  sacubitril-valsartan (ENTRESTO) 24-26 MG Take 1 tablet by mouth 2 (two) times daily. 09/25/22   [name], MD  spironolactone (ALDACTONE) 25 MG tablet Take 0.5 tablets (12.5 mg total) by mouth daily. 06/06/22 06/01/23  Loel Dubonnet, NP  thiamine (VITAMIN B-1) 50 MG tablet Take 25 mg by mouth daily.    [provider]      Allergies    Atropine, Fluoxetine, Serotonin reuptake inhibitors (ssris), and Keflex [cephalexin]    Review of Systems   Review of Systems  Constitutional:  Negative for chills and fever.  Gastrointestinal:  Positive for abdominal pain, diarrhea, nausea and vomiting. Negative for blood in stool.    Physical Exam Updated Vital Signs BP (!) 108/55 (BP Location: Right Arm)   Pulse 65   Temp 97.6 F (36.4 C) (Oral)   Resp 19   Ht '5\' 6"'$  (1.676 m)   Wt 86.6 kg   SpO2  100%   BMI 30.83 kg/m  Physical Exam Vitals and nursing note reviewed.  Constitutional:      Appearance: Normal appearance.     Comments: Uncomfortable, but not toxic appearing  HENT:     Head: Normocephalic and atraumatic.  Eyes:     General: No scleral icterus. Cardiovascular:     Rate and Rhythm: Normal rate and regular rhythm.  Pulmonary:     Effort: Pulmonary effort is normal.     Breath sounds: Normal breath sounds.  Abdominal:     General: Bowel sounds are normal.     Palpations: Abdomen is soft.     Tenderness: There is abdominal tenderness in the periumbilical area.  Hernia: A hernia is present. Hernia is present in the umbilical area.     Comments: Approximately lemon sized umbilical hernia that is not reducible.  There is very small amount of overlying erythema and warmth.  She has tenderness overlying the area.  Normal active bowel sounds.  Rest of the abdomen is soft.  Musculoskeletal:        General: No deformity.     Cervical back: Normal range of motion.  Skin:    General: Skin is warm and dry.  Neurological:     General: No focal deficit present.     Mental Status: She is alert. Mental status is at baseline.     ED Results / Procedures / Treatments   Labs (all labs ordered are listed, but only abnormal results are displayed) Labs Reviewed  COMPREHENSIVE METABOLIC PANEL - Abnormal; Notable for the following components:      Result Value   Sodium 127 (*)    Potassium 5.3 (*)    Chloride 96 (*)    CO2 21 (*)    Glucose, Bld 102 (*)    Creatinine, Ser 1.35 (*)    Calcium 8.8 (*)    Albumin 3.4 (*)    GFR, Estimated 38 (*)    All other components within normal limits  I-STAT CHEM 8, ED - Abnormal; Notable for the following components:   Sodium 127 (*)    Potassium 5.6 (*)    Chloride 95 (*)    Creatinine, Ser 1.30 (*)    Glucose, Bld 101 (*)    Calcium, Ion 1.08 (*)    All other components within normal limits  LIPASE, BLOOD  CBC  LACTIC  ACID, PLASMA  URINALYSIS, ROUTINE W REFLEX MICROSCOPIC    EKG None  Radiology CT ABDOMEN PELVIS W CONTRAST  Result Date: 10/27/2022 CLINICAL DATA:  Increased periumbilical pain, nausea, and vomiting in the setting of known hernia EXAM: CT ABDOMEN AND PELVIS WITH CONTRAST TECHNIQUE: Multidetector CT imaging of the abdomen and pelvis was performed using the standard protocol following bolus administration of intravenous contrast. RADIATION DOSE REDUCTION: This exam was performed according to the departmental dose-optimization program which includes automated exposure control, adjustment of the mA and/or kV according to patient size and/or use of iterative reconstruction technique. CONTRAST:  158m OMNIPAQUE IOHEXOL 300 MG/ML  SOLN COMPARISON:  CT abdomen and pelvis dated 10/07/2022 FINDINGS: Lower chest: No focal consolidation or pulmonary nodule in the lung bases. No pleural effusion or pneumothorax demonstrated. Partially imaged heart size is normal. Partially imaged pacemaker leads terminate in the right atrium and ventricle. Hepatobiliary: Subcentimeter hypodensity in segment 4 (2:22), unchanged, is too small to characterize. No intra or extrahepatic biliary ductal dilation. Normal gallbladder. Pancreas: No focal lesions or main ductal dilation. Spleen: Normal in size without focal abnormality. Adrenals/Urinary Tract: No adrenal nodules. No suspicious renal mass, calculi or hydronephrosis. Unchanged simple fluid attenuation focus adjacent to the right kidney measuring 5.2 cm (2:39). No focal bladder wall thickening. Stomach/Bowel: Small hiatal hernia. Normal appearance of the stomach. No evidence of bowel wall thickening, distention, or inflammatory changes. Colonic diverticulosis without acute diverticulitis. Appendectomy. Vascular/Lymphatic: Aortic atherosclerosis. Circumaortic left renal vein. No enlarged abdominal or pelvic lymph nodes. Reproductive: No adnexal masses. Other: Slightly increased small  volume free fluid and mesenteric stranding. Free air. Moderate fat-containing paraumbilical hernia is not substantially changed with persistent increased density. Musculoskeletal: No acute or abnormal lytic or blastic osseous lesions. IMPRESSION: 1. Moderate fat-containing paraumbilical hernia is not substantially changed  with persistent increased density, favored to reflect marked fat stranding rather than underlying soft tissue lesion. 2. Slightly increased small volume free fluid and mesenteric stranding, nonspecific, may be reactive. 3. Small hiatal hernia. 4. Colonic diverticulosis without acute diverticulitis. 5.  Aortic Atherosclerosis (ICD10-I70.0). Electronically Signed   By: Darrin Nipper M.D.   On: 10/27/2022 15:51    Procedures Procedures    Medications Ordered in ED Medications  dextrose 5 %-0.9 % sodium chloride infusion (has no administration in time range)  iohexol (OMNIPAQUE) 300 MG/ML solution 100 mL (100 mLs Intravenous Contrast Given 10/27/22 1526)  ondansetron (ZOFRAN) injection 4 mg (4 mg Intravenous Given 10/27/22 1546)  fentaNYL (SUBLIMAZE) injection 50 mcg (50 mcg Intravenous Given 10/27/22 1704)  sodium chloride 0.9 % bolus 1,000 mL (1,000 mLs Intravenous New Bag/Given 10/27/22 1703)    ED Course/ Medical Decision Making/ A&P Clinical Course as of 10/27/22 1733  Mon Oct 27, 2022  1626 Spoke with Claiborne Billings, [place] with [facility]. They will see her while she is inpatient.  [RR]    Clinical Course User Index [RR] Sherrell Puller, PA-C                            Medical Decision Making Amount and/or Complexity of Data Reviewed Labs: ordered. Radiology: ordered.  Risk Prescription drug management. Decision regarding hospitalization.   89 year old female presents the emergency room today for evaluation of worsening abdominal pain with nausea and vomiting.  Differential diagnosis includes is not mended to incarcerated hernia versus strangulated hernia versus intractable nausea vomiting  versus electrolyte abnormality.  Vital signs show mild bradycardia 59 otherwise normotensive, afebrile, satting well on room air without any increased work of breathing.  Patient is usually borderline bradycardic so this is her baseline.  Physical exam as noted above.  Concern for the patient's heart and hernia as well as her increase in pain and nausea and vomiting.  Will obtain another CT image.  I independently reviewed and interpreted the patient's labs.  CMP shows sodium at 127.  Patient does have documented history of hyponatremia however this is the lowest it has been in 5 months.  She does have a slightly increased potassium at 5.3 although it is down from her previous potassium at 5.6 earlier this month.  She does have a decrease in her bicarb and anion gap is normal.  Creatinine is at his baseline around 1.35.  Glucose slightly elevated.  ALT, AST, alk phos, and total bili within normal limits.  Lactic acid within normal limits.  Lipase within normal limits.  CBC without leukocytosis or anemia.  Urinalysis still needs to be collected.  CT abdomen shows 1. Moderate fat-containing paraumbilical hernia is not substantially changed with persistent increased density, favored to reflect marked fat stranding rather than underlying soft tissue lesion. 2. Slightly increased small volume free fluid and mesenteric stranding, nonspecific, may be reactive. 3. Small hiatal hernia. 4. Colonic diverticulosis without acute diverticulitis. 5.  Aortic Atherosclerosis (ICD10-I70.0).   I attempted, along with my attending to reduce the hernia, but was unsuccessful.   I spoke with Saverio Danker, PA-C with [facility] surgery.  Discussed the patient's presentation and new CT imaging.  She will come see the patient inpatient tomorrow for evaluation.  Patient was given some fluids as well as Zofran and fentanyl for pain and symptom control.  Given the patient's electrolyte abnormalities as well as nausea,  vomiting, and pain, will admit to medicine for further  maintenance of the nausea, vomiting, and pain in her electrolyte abnormality as well as a surgical evaluation in the morning.  Discussed this with patient and daughter at bedside who are amenable.  Admit to Dr. Grandville Silos.   I discussed this case with my attending physician who cosigned this note including patient's presenting symptoms, physical exam, and planned diagnostics and interventions. Attending physician stated agreement with plan or made changes to plan which were implemented.   Attending physician assessed patient at bedside.  Final Clinical Impression(s) / ED Diagnoses Final diagnoses:  Hyponatremia  Umbilical hernia without obstruction or gangrene  Nausea and vomiting, unspecified vomiting type    Rx / DC Orders ED Discharge Orders     None         Sherrell Puller, PA-C 10/27/22 1744    Sherrell Puller, PA-C 10/27/22 [place], [place], DO 10/27/22 1757

## 2022-10-27 NOTE — Progress Notes (Signed)
Subjective/Chief Complaint: Abdominal pain  This is an 89 year old female known to our group who was seen by Dr. Windle Guard on January 11th for incarcerated local hernia that was causing pain.  She had had a CT scan showing an umbilical hernia containing fat.  She has been having intermittent abdominal pain for a week with occasional nausea and diarrhea.  After being seen by Dr. Windle Guard she is scheduled for surgery this coming Friday. Over the weekend she developed nausea and vomiting as well as diarrhea.  She came for preoperative visit today because of the symptoms was sent to the emergency department.  She has had a repeat CT scan of the abdomen pelvis which is minimally changed showing the umbilical hernia containing fat only without bowel involvement.  There is only a slight increase in small amount of free fluid in the mesentery which was nonspecific and felt to be reactive.  Her laboratory data still showed a normal white blood count and a normal lactic acid level.  Currently, she reports much less abdominal pain but still has had some nausea.  She has been admitted by the hospitalist service.   Objective: Vital signs in last 24 hours: Temp:  [97.6 F (36.4 C)-98 F (36.7 C)] 98 F (36.7 C) (01/22 1715) Pulse Rate:  [59-65] 59 (01/22 1709) Resp:  [18-19] 18 (01/22 1709) BP: (108-125)/(54-70) 118/70 (01/22 1709) SpO2:  [95 %-100 %] 100 % (01/22 1709) Weight:  [86.6 kg] 86.6 kg (01/22 1332)    Intake/Output from previous day: No intake/output data recorded. Intake/Output this shift: No intake/output data recorded.  Exam: She is awake and alert. She does not appear to be in distress Her abdomen is morbidly obese with a chronically incarcerated umbilical hernia which is mildly tender.  There is some tenderness with a mild amount of guarding in the right lower abdomen.  Lab Results:  Recent Labs    10/27/22 1420 10/27/22 1437 10/27/22 1907  WBC 7.4  --  7.3  HGB 12.6 13.6  11.6*  HCT 38.9 40.0 35.9*  PLT 287  --  255   BMET Recent Labs    10/27/22 1420 10/27/22 1437 10/27/22 1907  NA 127* 127* 128*  K 5.3* 5.6* 4.5  CL 96* 95* 97*  CO2 21*  --  20*  GLUCOSE 102* 101* 91  BUN '13 16 13  '$ CREATININE 1.35* 1.30* 1.34*  CALCIUM 8.8*  --  8.5*   PT/INR No results for input(s): "LABPROT", "INR" in the last 72 hours. ABG No results for input(s): "PHART", "HCO3" in the last 72 hours.  Invalid input(s): "PCO2", "PO2"  Studies/Results: CT ABDOMEN PELVIS W CONTRAST  Result Date: 10/27/2022 CLINICAL DATA:  Increased periumbilical pain, nausea, and vomiting in the setting of known hernia EXAM: CT ABDOMEN AND PELVIS WITH CONTRAST TECHNIQUE: Multidetector CT imaging of the abdomen and pelvis was performed using the standard protocol following bolus administration of intravenous contrast. RADIATION DOSE REDUCTION: This exam was performed according to the departmental dose-optimization program which includes automated exposure control, adjustment of the mA and/or kV according to patient size and/or use of iterative reconstruction technique. CONTRAST:  120m OMNIPAQUE IOHEXOL 300 MG/ML  SOLN COMPARISON:  CT abdomen and pelvis dated 10/07/2022 FINDINGS: Lower chest: No focal consolidation or pulmonary nodule in the lung bases. No pleural effusion or pneumothorax demonstrated. Partially imaged heart size is normal. Partially imaged pacemaker leads terminate in the right atrium and ventricle. Hepatobiliary: Subcentimeter hypodensity in segment 4 (2:22), unchanged, is too small to  characterize. No intra or extrahepatic biliary ductal dilation. Normal gallbladder. Pancreas: No focal lesions or main ductal dilation. Spleen: Normal in size without focal abnormality. Adrenals/Urinary Tract: No adrenal nodules. No suspicious renal mass, calculi or hydronephrosis. Unchanged simple fluid attenuation focus adjacent to the right kidney measuring 5.2 cm (2:39). No focal bladder wall  thickening. Stomach/Bowel: Small hiatal hernia. Normal appearance of the stomach. No evidence of bowel wall thickening, distention, or inflammatory changes. Colonic diverticulosis without acute diverticulitis. Appendectomy. Vascular/Lymphatic: Aortic atherosclerosis. Circumaortic left renal vein. No enlarged abdominal or pelvic lymph nodes. Reproductive: No adnexal masses. Other: Slightly increased small volume free fluid and mesenteric stranding. Free air. Moderate fat-containing paraumbilical hernia is not substantially changed with persistent increased density. Musculoskeletal: No acute or abnormal lytic or blastic osseous lesions. IMPRESSION: 1. Moderate fat-containing paraumbilical hernia is not substantially changed with persistent increased density, favored to reflect marked fat stranding rather than underlying soft tissue lesion. 2. Slightly increased small volume free fluid and mesenteric stranding, nonspecific, may be reactive. 3. Small hiatal hernia. 4. Colonic diverticulosis without acute diverticulitis. 5.  Aortic Atherosclerosis (ICD10-I70.0). Electronically Signed   By: Darrin Nipper M.D.   On: 10/27/2022 15:51    Anti-infectives: Anti-infectives (From admission, onward)    None       Assessment/Plan: Incarcerated umbilical hernia containing fat  I again discussed this with the patient.  I do not believe she requires any emergent surgery this evening.  There is no bowel involved in the hernia and no evidence of bowel obstruction.  She does have an increase in her creatinine and is getting IV fluids for this.  Again her white blood count lactic acid level are normal.  I am uncertain if the umbilical hernia itself is the cause of all her other symptoms but her workup is ongoing.  Either Dr. Kae Heller or our acute care surgeon of the week will follow-up with the patient tomorrow and determine whether or not the surgery needs to be moved up from this coming Friday.   LOS: 0 days    Coralie Keens MD 10/27/2022

## 2022-10-27 NOTE — Progress Notes (Signed)
PERIOPERATIVE PRESCRIPTION FOR IMPLANTED CARDIAC DEVICE PROGRAMMING  Patient Information: Name:  [name]  DOB:  1931/03/28  MRN:  [id number]   Planned Procedure:  Umbilical hernia repair  Surgeon: Kae Heller  Date of Procedure: 10-31-2022  Cautery will be used.  Position during surgery: Supine   Device Information:  Clinic EP Physician:  Allegra Lai, MD   Device Type:  Pacemaker Manufacturer and Phone #:  [employer]: [id number] Pacemaker Dependent?:  Yes.   Date of Last Device Check:  08/31/2022 Normal Device Function?:  Yes.    Electrophysiologist's Recommendations:  Have magnet available. Provide continuous ECG monitoring when magnet is used or reprogramming is to be performed.  Procedure may interfere with device function.  Magnet should be placed over device during procedure.  Per Device Clinic Standing Orders, Damian Leavell, RN  10:41 AM 10/27/2022

## 2022-10-27 NOTE — Telephone Encounter (Signed)
Received notification from [facility] regarding Eliquis patient assistance, needs to spend 3% out of pocket prior to approval   [employer] for [place], patient approved through 12/24  Patient currently in hospital, will call when out  This encounter was created in error - please disregard.

## 2022-10-27 NOTE — Telephone Encounter (Signed)
Received notification from [facility] regarding Eliquis patient assistance, needs to spend 3% out of pocket prior to approval   [employer] for [place], patient approved through 12/24  Patient currently in hospital, will call when out

## 2022-10-27 NOTE — Telephone Encounter (Signed)
Patient was able to get [facility] Saturday for $5. States after taking she started coughing more but is not sure if its related to the inhaler. Daughter states she is going to try the inhaler a couple of more times and call us back if the coughing is still increased. Nothing further needed at this time

## 2022-10-28 ENCOUNTER — Inpatient Hospital Stay (HOSPITAL_COMMUNITY): Payer: Medicare HMO

## 2022-10-28 DIAGNOSIS — K460 Unspecified abdominal hernia with obstruction, without gangrene: Secondary | ICD-10-CM | POA: Diagnosis not present

## 2022-10-28 DIAGNOSIS — R112 Nausea with vomiting, unspecified: Secondary | ICD-10-CM | POA: Diagnosis not present

## 2022-10-28 DIAGNOSIS — K429 Umbilical hernia without obstruction or gangrene: Secondary | ICD-10-CM | POA: Diagnosis not present

## 2022-10-28 DIAGNOSIS — E871 Hypo-osmolality and hyponatremia: Secondary | ICD-10-CM | POA: Diagnosis not present

## 2022-10-28 LAB — CBC
HCT: 39.2 % (ref 36.0–46.0)
Hemoglobin: 12.6 g/dL (ref 12.0–15.0)
MCH: 28.3 pg (ref 26.0–34.0)
MCHC: 32.1 g/dL (ref 30.0–36.0)
MCV: 88.1 fL (ref 80.0–100.0)
Platelets: 290 10*3/uL (ref 150–400)
RBC: 4.45 MIL/uL (ref 3.87–5.11)
RDW: 13 % (ref 11.5–15.5)
WBC: 6.5 10*3/uL (ref 4.0–10.5)
nRBC: 0 % (ref 0.0–0.2)

## 2022-10-28 LAB — COMPREHENSIVE METABOLIC PANEL
ALT: 9 U/L (ref 0–44)
AST: 16 U/L (ref 15–41)
Albumin: 3.5 g/dL (ref 3.5–5.0)
Alkaline Phosphatase: 79 U/L (ref 38–126)
Anion gap: 10 (ref 5–15)
BUN: 13 mg/dL (ref 8–23)
CO2: 22 mmol/L (ref 22–32)
Calcium: 9.2 mg/dL (ref 8.9–10.3)
Chloride: 99 mmol/L (ref 98–111)
Creatinine, Ser: 1.39 mg/dL — ABNORMAL HIGH (ref 0.44–1.00)
GFR, Estimated: 36 mL/min — ABNORMAL LOW (ref >60)
Glucose, Bld: 88 mg/dL (ref 70–99)
Potassium: 4.7 mmol/L (ref 3.5–5.1)
Sodium: 131 mmol/L — ABNORMAL LOW (ref 135–145)
Total Bilirubin: 0.6 mg/dL (ref 0.3–1.2)
Total Protein: 7.3 g/dL (ref 6.5–8.1)

## 2022-10-28 LAB — GLUCOSE, CAPILLARY: Glucose-Capillary: 89 mg/dL (ref 70–99)

## 2022-10-28 MED ORDER — PROCHLORPERAZINE EDISYLATE 10 MG/2ML IJ SOLN
5.0000 mg | Freq: Once | INTRAMUSCULAR | Status: AC
  Administered 2022-10-28: 5 mg via INTRAVENOUS
  Filled 2022-10-28: qty 2

## 2022-10-28 MED ORDER — TECHNETIUM TC 99M MEBROFENIN IV KIT
5.0000 | PACK | Freq: Once | INTRAVENOUS | Status: AC | PRN
  Administered 2022-10-28: 5.2 via INTRAVENOUS

## 2022-10-28 MED ORDER — HEPARIN SODIUM (PORCINE) 5000 UNIT/ML IJ SOLN
5000.0000 [IU] | Freq: Three times a day (TID) | INTRAMUSCULAR | Status: DC
  Administered 2022-10-28 – 2022-10-30 (×5): 5000 [IU] via SUBCUTANEOUS
  Filled 2022-10-28 (×5): qty 1

## 2022-10-28 NOTE — TOC Initial Note (Signed)
Transition of Care [facility]) - Initial/Assessment Note    Patient Details  Name: [name] MRN: [id number] Date of Birth: [birth year]  Transition of Care [facility]) CM/SW Contact:    Leeroy Cha, RN Phone Number: 10/28/2022, 8:58 AM  Clinical Narrative:                  Transition of Care [facility]) Screening Note   Patient Details  Name: [name] Date of Birth: 1931-07-10   Transition of Care [facility]) CM/SW Contact:    Leeroy Cha, RN Phone Number: 10/28/2022, 8:58 AM    Transition of Care Department [facility]) has reviewed patient and no TOC needs have been identified at this time. We will continue to monitor patient advancement through interdisciplinary progression rounds. If new patient transition needs arise, please place a TOC consult.    Expected Discharge Plan: Home/Self Care Barriers to Discharge: Continued Medical Work up   Patient Goals and CMS Choice            Expected Discharge Plan and Services       Living arrangements for the past 2 months: Single Family Home                                      Prior Living Arrangements/Services Living arrangements for the past 2 months: Single Family Home Lives with:: Self Patient language and need for interpreter reviewed:: Yes Do you feel safe going back to the place where you live?: Yes      Need for Family Participation in Patient Care: No (Comment)     Criminal Activity/Legal Involvement Pertinent to Current Situation/Hospitalization: No - Comment as needed  Activities of Daily Living      Permission Sought/Granted                  Emotional Assessment Appearance:: Appears stated age Attitude/Demeanor/Rapport: Gracious Affect (typically observed): Calm Orientation: : Oriented to Self, Oriented to Place, Oriented to  Time, Oriented to Situation Alcohol / Substance Use: Never Used Psych Involvement: No (comment)  Admission diagnosis:  Umbilical hernia without  obstruction or gangrene [K42.9] Hyponatremia [E87.1] Intractable nausea and vomiting [R11.2] Nausea and vomiting, unspecified vomiting type [R11.2] Patient Active Problem List   Diagnosis Date Noted   Intractable nausea and vomiting 10/27/2022   Incarcerated hernia 10/27/2022   Dehydration 10/27/2022   Hyperkalemia 41/42/3953   Umbilical hernia without obstruction or gangrene 10/27/2022   Chronic combined systolic and diastolic heart failure ([place]) 08/26/2022   Sick sinus syndrome ([place]) 05/28/2022   UTI (urinary tract infection) 05/28/2022   Acute on chronic systolic CHF (congestive heart failure) ([place]) 05/28/2022   Acute kidney injury superimposed on chronic kidney disease ([place]) 05/28/2022   Persistent atrial fibrillation ([place])    Acute encephalopathy 12/25/2021   HLD (hyperlipidemia) 12/25/2021   Class 1 obesity 12/11/2021   Community acquired pneumonia    Atrial fibrillation with RVR ([place]) 12/09/2021   Respiratory distress 12/08/2021   Normocytic anemia 12/08/2021   Respiratory distress secondary to acute exacerbation of congestive heart failure ([place]) 12/08/2021   Hyponatremia 12/08/2021   Hypothyroidism 12/08/2021   Essential hypertension 12/08/2021   PCP:  Merrilee Seashore, MD Pharmacy:   [place], [place] - [address] [place] [address] Phone: [phone] Fax: [place] [address] [place] [address] Phone:  [phone] Fax: [place] [address] Phone: [phone] Fax: [phone]     Social Determinants of Health (SDOH) Social History: SDOH Screenings   Food Insecurity: No Food Insecurity (12/16/2021)  Housing: Low Risk  (12/16/2021)  Transportation Needs: No Transportation Needs (12/16/2021)  Alcohol Screen: Low Risk  (12/16/2021)  Financial Resource  Strain: Low Risk  (12/16/2021)  Tobacco Use: Low Risk  (10/27/2022)   SDOH Interventions:     Readmission Risk Interventions   No data to display

## 2022-10-28 NOTE — Progress Notes (Signed)
OT Cancellation Note  Patient Details Name: FINESSE FIELDER MRN: [id number] DOB: 1930-10-20   Cancelled Treatment:    Reason Eval/Treat Not Completed: Patient at procedure or test/ unavailable Patient is off hall at [place]. OT to continue to follow and check back as schedule will allow. Rennie Plowman, MS Acute Rehabilitation Department Office# [phone]  10/28/2022, 1:18 PM

## 2022-10-28 NOTE — Progress Notes (Signed)
PROGRESS NOTE    [name]  NGE:[id number] DOB: 04/27/1931 DOA: 10/27/2022 PCP: Merrilee Seashore, MD    Chief Complaint  Patient presents with   Abdominal Pain    Brief Narrative:  Patient pleasant 89 year old female history of chronic combined systolic and diastolic CHF, hypertension, CKD stage IIIb, A-fib status post PPM, GERD, history of incarcerated umbilical hernia noted to be scheduled for hernia repair by general surgery 10/31/2022 presented to preop testing with intractable nausea vomiting worsening abdominal pain x 2 to 3 days.  Patient noted to have electrolyte abnormalities.  Patient admitted.  General surgery consulted.   Assessment & Plan:   Principal Problem:   Intractable nausea and vomiting Active Problems:   Incarcerated hernia   Atrial fibrillation with RVR (HCC)   Hyponatremia   Essential hypertension   Hypothyroidism   HLD (hyperlipidemia)   Chronic combined systolic and diastolic heart failure (H[facility])   Dehydration   Hyperkalemia   Umbilical hernia without obstruction or gangrene  #1 intractable nausea and vomiting likely secondary to incarcerated umbilical hernia -Patient presented with intractable nausea vomiting periumbilical pain and right lower quadrant pain worsening over the past 2 to 3 days with inability to keep anything down. -Patient with significant abdominal pain, right lower quadrant on exam, Harding umbilical hernia which is not reducible. -CT abdomen and pelvis done with moderate fat-containing periumbilical hernia is not substantially changed with persistent increased density, favored to reflect marked fat stranding rather than underlying soft tissue lesion. -Patient placed on bowel rest, placed on IV fluids with D5 normal saline at 100 cc an hour, IV antiemetics, IV pain medication, supportive care. -Patient seen in consultation by general surgery HIDA scan as well as right upper quadrant ultrasound ordered to rule out biliary  etiology. -Per general surgery.   2.  Hyponatremia -Likely secondary to hypovolemic hyponatremia in the setting of intractable nausea and vomiting x 2 to 3 days, diuretics of Lasix, spironolactone, Entresto. -Continue to hold spironolactone, Entresto, Lasix.   -Hyponatremia improving with hydration.   -IV fluids.     3.  Hyperkalemia -Likely secondary to spironolactone, Entresto. -Repeat labs with resolution of hyperkalemia, potassium at 4.7 today.   4.  Hypothyroidism Continue Synthroid 112 mcg IV daily.   5.  A-fib status post PPM -Continue home regimen amiodarone, IV Lopressor.  Hold Toprol-XL.   -Anticoagulation on hold in anticipation of possible surgery.  -General surgery to advise when anticoagulation may be resumed.   6.  GERD -Continue IV PPI.   7.  Hyperlipidemia -Continue to hold statin.   8.  CKD stage IIIb -Stable.     DVT prophylaxis: Heparin Code Status: DNR Family Communication: Updated patient and daughter at bedside. Disposition: TBD  Status is: Inpatient Remains inpatient appropriate because: Severity of illness   Consultants:  General surgery: Dr. Ninfa Linden 10/27/2022  Procedures:  CT abdomen pelvis 10/27/2022: HIDA scan pending 10/28/2022 Right upper quadrant ultrasound pending 10/28/2022  Antimicrobials:  None    Subjective: Patient laying in bed.  Denies any chest pain.  No significant shortness of breath.  Still with complaints of lower abdominal pain more right lower quadrant and left lower quadrant.  No emesis today but complains of dry heaves and nausea.  States last bowel movement which was a mix between soft and loose to was on Sunday, 10/26/2022.  Daughter at bedside.  Objective: Vitals:   10/28/22 0300 10/28/22 0600 10/28/22 0643 10/28/22 0712  BP: 132/60 (!) 110/52  (!) 128/55  Pulse: (!) 59 Marland Kitchen)  59  66  Resp: (!) '23 17  20  '$ Temp:   98.1 F (36.7 C) 97.9 F (36.6 C)  TempSrc:   Oral Oral  SpO2: 92% 94%  99%  Weight:    87 kg   Height:    '5\' 6"'$  (1.676 m)   No intake or output data in the 24 hours ending 10/28/22 1104 Filed Weights   10/27/22 1332 10/28/22 0712  Weight: 86.6 kg 87 kg    Examination:  General exam: Appears calm and comfortable  Respiratory system: Clear to auscultation. Respiratory effort normal. Cardiovascular system: S1 & S2 heard, RRR. No JVD, murmurs, rubs, gallops or clicks. No pedal edema. Gastrointestinal system: Abdomen is nondistended, soft and tender to palpation right lower quadrant, left lower quadrant.  Some tenderness to palpation in umbilical hernia which is nonreducible.  Positive bowel sounds.  No rebound.  Central nervous system: Alert and oriented. No focal neurological deficits. Extremities: Symmetric 5 x 5 power. Skin: No rashes, lesions or ulcers Psychiatry: Judgement and insight appear normal. Mood & affect appropriate.     Data Reviewed: I have personally reviewed following labs and imaging studies  CBC: Recent Labs  Lab 10/27/22 1420 10/27/22 1437 10/27/22 1907 10/28/22 0740  WBC 7.4  --  7.3 6.5  NEUTROABS  --   --  5.0  --   HGB 12.6 13.6 11.6* 12.6  HCT 38.9 40.0 35.9* 39.2  MCV 87.4  --  86.9 88.1  PLT 287  --  255 144    Basic Metabolic Panel: Recent Labs  Lab 10/27/22 1420 10/27/22 1437 10/27/22 1907 10/28/22 0740  NA 127* 127* 128* 131*  K 5.3* 5.6* 4.5 4.7  CL 96* 95* 97* 99  CO2 21*  --  20* 22  GLUCOSE 102* 101* 91 88  BUN '13 16 13 13  '$ CREATININE 1.35* 1.30* 1.34* 1.39*  CALCIUM 8.8*  --  8.5* 9.2  MG  --   --  2.0  --   PHOS  --   --  2.8  --     GFR: Estimated Creatinine Clearance: 30.5 mL/min (A) (by C-G formula based on SCr of 1.39 mg/dL (H)).  Liver Function Tests: Recent Labs  Lab 10/27/22 1420 10/27/22 1907 10/28/22 0740  AST '16 17 16  '$ ALT '9 9 9  '$ ALKPHOS 74 71 79  BILITOT 0.6 0.7 0.6  PROT 7.0 6.8 7.3  ALBUMIN 3.4* 3.1* 3.5    CBG: Recent Labs  Lab 10/28/22 0742  GLUCAP 89     No results found for  this or any previous visit (from the past 240 hour(s)).       Radiology Studies: CT ABDOMEN PELVIS W CONTRAST  Result Date: 10/27/2022 CLINICAL DATA:  Increased periumbilical pain, nausea, and vomiting in the setting of known hernia EXAM: CT ABDOMEN AND PELVIS WITH CONTRAST TECHNIQUE: Multidetector CT imaging of the abdomen and pelvis was performed using the standard protocol following bolus administration of intravenous contrast. RADIATION DOSE REDUCTION: This exam was performed according to the departmental dose-optimization program which includes automated exposure control, adjustment of the mA and/or kV according to patient size and/or use of iterative reconstruction technique. CONTRAST:  19m OMNIPAQUE IOHEXOL 300 MG/ML  SOLN COMPARISON:  CT abdomen and pelvis dated 10/07/2022 FINDINGS: Lower chest: No focal consolidation or pulmonary nodule in the lung bases. No pleural effusion or pneumothorax demonstrated. Partially imaged heart size is normal. Partially imaged pacemaker leads terminate in the right atrium and ventricle. Hepatobiliary: Subcentimeter hypodensity  in segment 4 (2:22), unchanged, is too small to characterize. No intra or extrahepatic biliary ductal dilation. Normal gallbladder. Pancreas: No focal lesions or main ductal dilation. Spleen: Normal in size without focal abnormality. Adrenals/Urinary Tract: No adrenal nodules. No suspicious renal mass, calculi or hydronephrosis. Unchanged simple fluid attenuation focus adjacent to the right kidney measuring 5.2 cm (2:39). No focal bladder wall thickening. Stomach/Bowel: Small hiatal hernia. Normal appearance of the stomach. No evidence of bowel wall thickening, distention, or inflammatory changes. Colonic diverticulosis without acute diverticulitis. Appendectomy. Vascular/Lymphatic: Aortic atherosclerosis. Circumaortic left renal vein. No enlarged abdominal or pelvic lymph nodes. Reproductive: No adnexal masses. Other: Slightly increased  small volume free fluid and mesenteric stranding. Free air. Moderate fat-containing paraumbilical hernia is not substantially changed with persistent increased density. Musculoskeletal: No acute or abnormal lytic or blastic osseous lesions. IMPRESSION: 1. Moderate fat-containing paraumbilical hernia is not substantially changed with persistent increased density, favored to reflect marked fat stranding rather than underlying soft tissue lesion. 2. Slightly increased small volume free fluid and mesenteric stranding, nonspecific, may be reactive. 3. Small hiatal hernia. 4. Colonic diverticulosis without acute diverticulitis. 5.  Aortic Atherosclerosis (ICD10-I70.0). Electronically Signed   By: Darrin Nipper M.D.   On: 10/27/2022 15:51        Scheduled Meds:  amiodarone  200 mg Oral Daily   [START ON 11/03/2022] levothyroxine  112 mcg Intravenous Daily   metoprolol tartrate  5 mg Intravenous Q8H   mometasone-formoterol  2 puff Inhalation BID   pantoprazole (PROTONIX) IV  40 mg Intravenous Q24H   prochlorperazine  5 mg Intravenous Once   Continuous Infusions:  dextrose 5 % and 0.9% NaCl 100 mL/hr at 10/28/22 0808     LOS: 1 day    Time spent: 40 minutes    Irine Seal, MD [facility]   To contact the attending provider between 7A-7P or the covering provider during after hours 7P-7A, please log into the web site [email or web address] and access using universal [place] password for that web site. If you do not have the password, please call the hospital operator.  10/28/2022, 11:04 AM

## 2022-10-28 NOTE — Evaluation (Signed)
Physical Therapy Evaluation Patient Details Name: [name] MRN: [id number] DOB: 11-03-1930 Today's Date: 10/28/2022  History of Present Illness  Pt is an 89 y/o F admitted on 10/27/22. Pt was scheduled to have hernia repair on 10/31/22 but when she presented for pre-op testing pt was found to have intractable N&V, worsening pain with some associated SOB, diarrhea, lightheadedness, & R flank pain so pt advised to go to ED. Pt has been admitted for medical management with plans for general sx to consult. PMH: chronic combined systolic & diastolic CHF, HTN, CKD 3, a-fib s/p PPM, GERD, incarcerated umbilical hernia, R BBB  Clinical Impression  Pt seen for PT evaluation with pt's daughter present in room. Prior to admission pt was living in her house with her daughter providing 24/7 care, pt was ambulatory with rollator without assistance but daughter managed all IADLS (finances, med management, cooking, cleaning). On this date, pt reports incontinent BM & feeling nauseous throughout, but agreeable to PT & nursing staff to assist with peri hygiene. Pt requires min assist for bed mobility with use of hospital bed features, min assist for STS, CGA for static standing & to take 2-3 steps to R along EOB with RW. Pt limited by N&V throughout session. Encouraged pt to sit in recliner or use BSC when feeling able. Daughter reports pt has received HHPT f/u in the past & daughter is agreeable to this. Will continue to follow pt acutely to progress mobility as able, anticipate pt will mobilize better once feeling better.       Recommendations for follow up therapy are one component of a multi-disciplinary discharge planning process, led by the attending physician.  Recommendations may be updated based on patient status, additional functional criteria and insurance authorization.  Follow Up Recommendations Home health PT      Assistance Recommended at Discharge Frequent or constant Supervision/Assistance   Patient can return home with the following  A little help with bathing/dressing/bathroom;A little help with walking and/or transfers;Assistance with cooking/housework;Assist for transportation;Help with stairs or ramp for entrance    Equipment Recommendations None recommended by PT  Recommendations for Other Services       Functional Status Assessment Patient has had a recent decline in their functional status and demonstrates the ability to make significant improvements in function in a reasonable and predictable amount of time.     Precautions / Restrictions Precautions Precautions: Fall Restrictions Weight Bearing Restrictions: No      Mobility  Bed Mobility Overal bed mobility: Needs Assistance Bed Mobility: Supine to Sit, Rolling Rolling: Supervision (cuing to utilize bed rails, HOB somewhat elevated)   Supine to sit: Min assist, HOB elevated (assistance to upright trunk to sitting EOB)          Transfers Overall transfer level: Needs assistance Equipment used: Rolling walker (2 wheels) Transfers: Sit to/from Stand Sit to Stand: Min assist           General transfer comment: STS from EOB    Ambulation/Gait                  Stairs            Wheelchair Mobility    Modified Rankin (Stroke Patients Only)       Balance Overall balance assessment: Needs assistance Sitting-balance support: Feet supported Sitting balance-Leahy Scale: Fair     Standing balance support: Bilateral upper extremity supported, During functional activity Standing balance-Leahy Scale: Fair  Pertinent Vitals/Pain Pain Assessment Pain Assessment: Faces Faces Pain Scale: Hurts whole lot Pain Location: abdomen Pain Descriptors / Indicators: Grimacing, Discomfort Pain Intervention(s): Monitored during session    Home Living Family/patient expects to be discharged to:: Private residence Living Arrangements:  Children Available Help at Discharge: Family;Available 24 hours/day Type of Home: House Home Access: Stairs to enter Entrance Stairs-Rails: None Entrance Stairs-Number of Steps: 1   Home Layout: One level Home Equipment: Rollator (4 wheels);BSC/3in1;Grab bars - tub/shower Additional Comments: daughter currently living with patient, providing care day & night, pt's Husband died 3 months ago    Prior Function Prior Level of Function : Needs assist             Mobility Comments: Pt is mobile with rollator without assistance from daughter. ADLs Comments: Daughter assisting with med management, finances, cooking, cleaning     Hand Dominance        Extremity/Trunk Assessment   Upper Extremity Assessment Upper Extremity Assessment: Generalized weakness    Lower Extremity Assessment Lower Extremity Assessment: Generalized weakness    Cervical / Trunk Assessment Cervical / Trunk Assessment: Kyphotic  Communication   Communication: HOH  Cognition Arousal/Alertness: Awake/alert Behavior During Therapy: WFL for tasks assessed/performed Overall Cognitive Status: Within Functional Limits for tasks assessed                                          General Comments General comments (skin integrity, edema, etc.): Pt with N&V throughout session as well as incontinent BM, requiring total assist for peri hygiene    Exercises     Assessment/Plan    PT Assessment Patient needs continued PT services  PT Problem List Decreased coordination;Decreased strength;Pain;Decreased range of motion;Decreased activity tolerance;Decreased knowledge of use of DME;Decreased balance;Decreased mobility;Decreased safety awareness       PT Treatment Interventions Therapeutic exercise;DME instruction;Gait training;Balance training;Neuromuscular re-education;Stair training;Functional mobility training;Cognitive remediation;Therapeutic activities;Patient/family education    PT Goals  (Current goals can be found in the Care Plan section)  Acute Rehab PT Goals Patient Stated Goal: feel better PT Goal Formulation: With patient/family Time For Goal Achievement: 11/11/22 Potential to Achieve Goals: Good    Frequency Min 3X/week     Co-evaluation               AM-PAC PT "6 Clicks" Mobility  Outcome Measure Help needed turning from your back to your side while in a flat bed without using bedrails?: A Little Help needed moving from lying on your back to sitting on the side of a flat bed without using bedrails?: A Little Help needed moving to and from a bed to a chair (including a wheelchair)?: A Little Help needed standing up from a chair using your arms (e.g., wheelchair or bedside chair)?: A Little Help needed to walk in hospital room?: A Little Help needed climbing 3-5 steps with a railing? : A Lot 6 Click Score: 17    End of Session   Activity Tolerance: Treatment limited secondary to medical complications (Comment) Patient left: in bed;with call bell/phone within reach;with family/visitor present;with nursing/sitter in room Nurse Communication: Mobility status PT Visit Diagnosis: Muscle weakness (generalized) (M62.81);Other abnormalities of gait and mobility (R26.89)    Time: 0240-9735 PT Time Calculation (min) (ACUTE ONLY): 17 min   Charges:   PT Evaluation $PT Eval Moderate Complexity: Madrone  Sabra Heck, PT, DPT 10/28/22, 2:46 PM   Waunita Schooner 10/28/2022, 2:43 PM

## 2022-10-28 NOTE — Progress Notes (Signed)
Subjective/Chief Complaint: Still having abdominal pain which is exacerbated by chronic cough. Pain is over umbilical hernia but also in LLQ and RLQ and worst in RLQ. Still nauseas and having dry heaves exacerbated by cough as well as when she tried some ice last night. Having diarrhea  Daughter [name] at bedside Objective: Vital signs in last 24 hours: Temp:  [97.6 F (36.4 C)-98.2 F (36.8 C)] 97.9 F (36.6 C) (01/23 0712) Pulse Rate:  [59-66] 66 (01/23 0712) Resp:  [15-23] 20 (01/23 0712) BP: (108-155)/(52-70) 128/55 (01/23 0712) SpO2:  [92 %-100 %] 99 % (01/23 0712) Weight:  [86.6 kg-87 kg] 87 kg (01/23 0712)    Intake/Output from previous day: No intake/output data recorded. Intake/Output this shift: No intake/output data recorded.  Exam: She is awake and alert. She does not appear to be in distress Her abdomen is morbidly obese with a chronically incarcerated umbilical hernia which is mildly tender.  There is some tenderness with a mild amount of guarding in the right lower abdomen and left lower abdomen  Lab Results:  Recent Labs    10/27/22 1420 10/27/22 1437 10/27/22 1907  WBC 7.4  --  7.3  HGB 12.6 13.6 11.6*  HCT 38.9 40.0 35.9*  PLT 287  --  255    BMET Recent Labs    10/27/22 1420 10/27/22 1437 10/27/22 1907  NA 127* 127* 128*  K 5.3* 5.6* 4.5  CL 96* 95* 97*  CO2 21*  --  20*  GLUCOSE 102* 101* 91  BUN '13 16 13  '$ CREATININE 1.35* 1.30* 1.34*  CALCIUM 8.8*  --  8.5*    PT/INR No results for input(s): "LABPROT", "INR" in the last 72 hours. ABG No results for input(s): "PHART", "HCO3" in the last 72 hours.  Invalid input(s): "PCO2", "PO2"  Studies/Results: CT ABDOMEN PELVIS W CONTRAST  Result Date: 10/27/2022 CLINICAL DATA:  Increased periumbilical pain, nausea, and vomiting in the setting of known hernia EXAM: CT ABDOMEN AND PELVIS WITH CONTRAST TECHNIQUE: Multidetector CT imaging of the abdomen and pelvis was performed using the  standard protocol following bolus administration of intravenous contrast. RADIATION DOSE REDUCTION: This exam was performed according to the departmental dose-optimization program which includes automated exposure control, adjustment of the mA and/or kV according to patient size and/or use of iterative reconstruction technique. CONTRAST:  164m OMNIPAQUE IOHEXOL 300 MG/ML  SOLN COMPARISON:  CT abdomen and pelvis dated 10/07/2022 FINDINGS: Lower chest: No focal consolidation or pulmonary nodule in the lung bases. No pleural effusion or pneumothorax demonstrated. Partially imaged heart size is normal. Partially imaged pacemaker leads terminate in the right atrium and ventricle. Hepatobiliary: Subcentimeter hypodensity in segment 4 (2:22), unchanged, is too small to characterize. No intra or extrahepatic biliary ductal dilation. Normal gallbladder. Pancreas: No focal lesions or main ductal dilation. Spleen: Normal in size without focal abnormality. Adrenals/Urinary Tract: No adrenal nodules. No suspicious renal mass, calculi or hydronephrosis. Unchanged simple fluid attenuation focus adjacent to the right kidney measuring 5.2 cm (2:39). No focal bladder wall thickening. Stomach/Bowel: Small hiatal hernia. Normal appearance of the stomach. No evidence of bowel wall thickening, distention, or inflammatory changes. Colonic diverticulosis without acute diverticulitis. Appendectomy. Vascular/Lymphatic: Aortic atherosclerosis. Circumaortic left renal vein. No enlarged abdominal or pelvic lymph nodes. Reproductive: No adnexal masses. Other: Slightly increased small volume free fluid and mesenteric stranding. Free air. Moderate fat-containing paraumbilical hernia is not substantially changed with persistent increased density. Musculoskeletal: No acute or abnormal lytic or blastic osseous lesions. IMPRESSION: 1. Moderate  fat-containing paraumbilical hernia is not substantially changed with persistent increased density, favored  to reflect marked fat stranding rather than underlying soft tissue lesion. 2. Slightly increased small volume free fluid and mesenteric stranding, nonspecific, may be reactive. 3. Small hiatal hernia. 4. Colonic diverticulosis without acute diverticulitis. 5.  Aortic Atherosclerosis (ICD10-I70.0). Electronically Signed   By: Darrin Nipper M.D.   On: 10/27/2022 15:51    Anti-infectives: Anti-infectives (From admission, onward)    None       Assessment/Plan: Incarcerated umbilical hernia containing fat - continued abdominal pain, nausea, dry heaves - lactic acid normal. WBC remains normal. UCX pending. Afebrile - on eliquis at baseline - last dose 1/22 am. Continue to hold. Okay for heparin gtt if indicated - not clear if all of her symptoms are from her [facility] as pain is worst in RLQ and present in LLQ.  - will discuss surgical plans with team. She does not need emergent surgery at this time and may advance to clears today  FEN: NPO ID: none VTE: okay for chemical prophylaxis from surgical standpoint  Per primary Hyponatremia Hypothyroidism A fib s/p PPM - on eliquis baseline GERD Hyperlipidemia CKD stage IIIb   LOS: 1 day   [name], [facility] 10/28/2022, 8:20 AM Please see Amion for pager number during day hours 7:00am-4:30pm

## 2022-10-29 DIAGNOSIS — R112 Nausea with vomiting, unspecified: Secondary | ICD-10-CM | POA: Diagnosis not present

## 2022-10-29 LAB — CBC WITH DIFFERENTIAL/PLATELET
Abs Immature Granulocytes: 0.02 10*3/uL (ref 0.00–0.07)
Basophils Absolute: 0 10*3/uL (ref 0.0–0.1)
Basophils Relative: 1 %
Eosinophils Absolute: 0.3 10*3/uL (ref 0.0–0.5)
Eosinophils Relative: 5 %
HCT: 33.2 % — ABNORMAL LOW (ref 36.0–46.0)
Hemoglobin: 10.5 g/dL — ABNORMAL LOW (ref 12.0–15.0)
Immature Granulocytes: 0 %
Lymphocytes Relative: 17 %
Lymphs Abs: 1 10*3/uL (ref 0.7–4.0)
MCH: 28.5 pg (ref 26.0–34.0)
MCHC: 31.6 g/dL (ref 30.0–36.0)
MCV: 90.2 fL (ref 80.0–100.0)
Monocytes Absolute: 0.8 10*3/uL (ref 0.1–1.0)
Monocytes Relative: 13 %
Neutro Abs: 3.7 10*3/uL (ref 1.7–7.7)
Neutrophils Relative %: 64 %
Platelets: 241 10*3/uL (ref 150–400)
RBC: 3.68 MIL/uL — ABNORMAL LOW (ref 3.87–5.11)
RDW: 13.2 % (ref 11.5–15.5)
WBC: 5.7 10*3/uL (ref 4.0–10.5)
nRBC: 0 % (ref 0.0–0.2)

## 2022-10-29 LAB — COMPREHENSIVE METABOLIC PANEL
ALT: 8 U/L (ref 0–44)
AST: 13 U/L — ABNORMAL LOW (ref 15–41)
Albumin: 2.6 g/dL — ABNORMAL LOW (ref 3.5–5.0)
Alkaline Phosphatase: 56 U/L (ref 38–126)
Anion gap: 8 (ref 5–15)
BUN: 9 mg/dL (ref 8–23)
CO2: 20 mmol/L — ABNORMAL LOW (ref 22–32)
Calcium: 8.1 mg/dL — ABNORMAL LOW (ref 8.9–10.3)
Chloride: 103 mmol/L (ref 98–111)
Creatinine, Ser: 1.28 mg/dL — ABNORMAL HIGH (ref 0.44–1.00)
GFR, Estimated: 40 mL/min — ABNORMAL LOW (ref >60)
Glucose, Bld: 116 mg/dL — ABNORMAL HIGH (ref 70–99)
Potassium: 4.6 mmol/L (ref 3.5–5.1)
Sodium: 131 mmol/L — ABNORMAL LOW (ref 135–145)
Total Bilirubin: 0.5 mg/dL (ref 0.3–1.2)
Total Protein: 5.4 g/dL — ABNORMAL LOW (ref 6.5–8.1)

## 2022-10-29 LAB — URINE CULTURE

## 2022-10-29 LAB — MAGNESIUM: Magnesium: 1.7 mg/dL (ref 1.7–2.4)

## 2022-10-29 MED ORDER — BOOST / RESOURCE BREEZE PO LIQD CUSTOM
1.0000 | Freq: Two times a day (BID) | ORAL | Status: DC
  Administered 2022-10-29 – 2022-11-01 (×5): 1 via ORAL

## 2022-10-29 NOTE — Progress Notes (Signed)
Patient seen this afternoon. Symptoms unchanged. [place] and HIDA reviewed- mild biliary dyskinesia. I discussed with her and her daughter the results of these tests.  I recommend proceeding with diagnostic laparoscopy to exclude any other gross intraabdominal pathology that could explain her pain or nausea (given findings of "misty mesentery" and sligh increase in free fluid noted on serial CT scans), cholecystectomy, and primary repair of the chronically incarcerated fat-containing umbilical hernia. I discussed the procedure in detail including relevant technical aspects, relevant anatomy, and risks of bleeding, infection, pain, scarring, injury to intra-abdominal structures, need to convert to open surgery, bile duct injury and sequelae, bile leak, subtotal cholecystectomy, poor wound healing or undesired cosmetic result, seroma/hematoma, hernia recurrence, as well as cardiovascular/pulmonary/thromboembolic risks. I emphasized to the patient and her daughter that her risk of complications is higher than average due to her age and medical comorbidities.  I think this has been optimized is much as possible and she has been cleared by cardiology to proceed with surgery. We also discussed that it is possible that removing her gallbladder and fixing her hernia will not completely resolve her symptoms, in which case further investigation and probable endoscopic evaluation would be the next steps.  Questions were welcomed and answered to their satisfaction.  They both wish to proceed and understand the risks involved.

## 2022-10-29 NOTE — Progress Notes (Addendum)
Subjective/Chief Complaint: Continues to have abdominal pain greatest in RLQ that is worse with movement. Had nausea and emesis after ensure yesterday. Tolerating clear liquids without nausea or emesis  Daughter Melissa at bedside  Objective: Vital signs in last 24 hours: Temp:  [97.7 F (36.5 C)-98.8 F (37.1 C)] 98.2 F (36.8 C) (01/24 0419) Pulse Rate:  [59-63] 63 (01/24 0419) Resp:  [17-20] 20 (01/24 0419) BP: (98-116)/(43-54) 108/48 (01/24 0419) SpO2:  [95 %-100 %] 95 % (01/24 0839) Weight:  [89.2 kg] 89.2 kg (01/24 0500)    Intake/Output from previous day: 01/23 0701 - 01/24 0700 In: 825 [I.V.:825] Out: 1 [Stool:1] Intake/Output this shift: No intake/output data recorded.  Exam: She is awake and alert. She does not appear to be in distress Her abdomen is obese with a chronically incarcerated umbilical hernia which is mildly tender.  There is some tenderness with a mild amount of guarding in the right lower abdomen and left lower abdomen. No RUQ TTP  Lab Results:  Recent Labs    10/28/22 0740 10/29/22 0414  WBC 6.5 5.7  HGB 12.6 10.5*  HCT 39.2 33.2*  PLT 290 241    BMET Recent Labs    10/28/22 0740 10/29/22 0414  NA 131* 131*  K 4.7 4.6  CL 99 103  CO2 22 20*  GLUCOSE 88 116*  BUN 13 9  CREATININE 1.39* 1.28*  CALCIUM 9.2 8.1*    PT/INR No results for input(s): "LABPROT", "INR" in the last 72 hours. ABG No results for input(s): "PHART", "HCO3" in the last 72 hours.  Invalid input(s): "PCO2", "PO2"  Studies/Results: NM Hepato W/EF  Result Date: 10/28/2022 CLINICAL DATA:  Abdominal pain with nausea and vomiting EXAM: NUCLEAR MEDICINE HEPATOBILIARY IMAGING WITH GALLBLADDER EF TECHNIQUE: Sequential images of the abdomen were obtained out to 60 minutes following intravenous administration of radiopharmaceutical. After oral ingestion of Ensure, gallbladder ejection fraction was determined. At 60 min, normal ejection fraction is greater than 33%.  RADIOPHARMACEUTICALS:  5.2 mCi Tc-33m Choletec IV COMPARISON:  CT 10/27/2022 FINDINGS: Prompt uptake and biliary excretion of activity by the liver is seen. Gallbladder activity is visualized, consistent with patency of cystic duct. Biliary activity passes into small bowel, consistent with patent common bile duct. Calculated gallbladder ejection fraction is 29%. (Normal gallbladder ejection fraction with Ensure is greater than 33%.) IMPRESSION: 1. Patency of the cystic duct and common bile duct without scintigraphic evidence of cholecystitis. 2. Gallbladder dysfunction with a decreased ejection fraction of 29%. Electronically Signed   By: NDavina PokeD.O.   On: 10/28/2022 14:28   [place]dom[place] Limited RUQ (LIVER/GB)  Result Date: 10/28/2022 CLINICAL DATA:  Cholecystitis EXAM: ULTRASOUND ABDOMEN LIMITED RIGHT UPPER QUADRANT COMPARISON:  CT 10/27/2022 FINDINGS: Gallbladder: The gallbladder is mildly distended without wall thickening or pericholecystic fluid. Negative sonographic Murphy sign. No gallstones identified. Common bile duct: Diameter: 3.0 mm, normal.  No intrahepatic ductal dilation. Liver: No focal lesion identified. Within normal limits in parenchymal echogenicity. Portal vein is patent on color Doppler imaging with normal direction of blood flow towards the liver. Other: Limited exam due to body habitus and bowel gas. IMPRESSION: No evidence of acute cholecystitis or biliary obstruction. Electronically Signed   By: JMaurine SimmeringM.D.   On: 10/28/2022 11:59   CT ABDOMEN PELVIS W CONTRAST  Result Date: 10/27/2022 CLINICAL DATA:  Increased periumbilical pain, nausea, and vomiting in the setting of known hernia EXAM: CT ABDOMEN AND PELVIS WITH CONTRAST TECHNIQUE: Multidetector CT imaging of  the abdomen and pelvis was performed using the standard protocol following bolus administration of intravenous contrast. RADIATION DOSE REDUCTION: This exam was performed according to the departmental  dose-optimization program which includes automated exposure control, adjustment of the mA and/or kV according to patient size and/or use of iterative reconstruction technique. CONTRAST:  137m OMNIPAQUE IOHEXOL 300 MG/ML  SOLN COMPARISON:  CT abdomen and pelvis dated 10/07/2022 FINDINGS: Lower chest: No focal consolidation or pulmonary nodule in the lung bases. No pleural effusion or pneumothorax demonstrated. Partially imaged heart size is normal. Partially imaged pacemaker leads terminate in the right atrium and ventricle. Hepatobiliary: Subcentimeter hypodensity in segment 4 (2:22), unchanged, is too small to characterize. No intra or extrahepatic biliary ductal dilation. Normal gallbladder. Pancreas: No focal lesions or main ductal dilation. Spleen: Normal in size without focal abnormality. Adrenals/Urinary Tract: No adrenal nodules. No suspicious renal mass, calculi or hydronephrosis. Unchanged simple fluid attenuation focus adjacent to the right kidney measuring 5.2 cm (2:39). No focal bladder wall thickening. Stomach/Bowel: Small hiatal hernia. Normal appearance of the stomach. No evidence of bowel wall thickening, distention, or inflammatory changes. Colonic diverticulosis without acute diverticulitis. Appendectomy. Vascular/Lymphatic: Aortic atherosclerosis. Circumaortic left renal vein. No enlarged abdominal or pelvic lymph nodes. Reproductive: No adnexal masses. Other: Slightly increased small volume free fluid and mesenteric stranding. Free air. Moderate fat-containing paraumbilical hernia is not substantially changed with persistent increased density. Musculoskeletal: No acute or abnormal lytic or blastic osseous lesions. IMPRESSION: 1. Moderate fat-containing paraumbilical hernia is not substantially changed with persistent increased density, favored to reflect marked fat stranding rather than underlying soft tissue lesion. 2. Slightly increased small volume free fluid and mesenteric stranding,  nonspecific, may be reactive. 3. Small hiatal hernia. 4. Colonic diverticulosis without acute diverticulitis. 5.  Aortic Atherosclerosis (ICD10-I70.0). Electronically Signed   By: LDarrin NipperM.D.   On: 10/27/2022 15:51    Anti-infectives: Anti-infectives (From admission, onward)    None       Assessment/Plan: Incarcerated umbilical hernia containing fat Abdominal pain Gallbladder dysfunction - continued abdominal pain and nausea. Emesis after ensure - lactic acid normal. WBC remains normal. UCX with multiple species. Afebrile - on eliquis at baseline - last dose 1/22 am. Continue to hold. Okay for heparin gtt if indicated - not clear if all of her symptoms are from her [facility] as pain is worst in RLQ and present in LLQ.  - [place] without gallstones or evidence of cholecystitis. HIDA with cystic duct patent with EF of 29%  Due to gallbladder dysfunction will plan for diagnostic laparoscopy [facility]cyst[facility]. She has been cleared by cardiology  FEN: CLD, NPO [place] ID: none VTE: heparin subq  Per primary Hyponatremia Hypothyroidism A fib s/p PPM - on eliquis baseline GERD Hyperlipidemia CKD stage IIIb   LOS: 2 days   MWinferd Humphrey PHospital [facility] CenterSur[facility] 10/29/2022, 9:51 AM Please see Amion for pager number during day hours 7:00am-4:30pm

## 2022-10-29 NOTE — Progress Notes (Signed)
PROGRESS NOTE    [name]  IEP:[id number] DOB: 1931-03-25 DOA: 10/27/2022 PCP: Merrilee Seashore, MD    Brief Narrative:  [name] is a 89 y.o. female with medical history significant of chronic combined syst and diastolic CHF, HTN, CKD stage III, Afib s/p PPM, GERD, hx of incarcerated umbilical hernia who was to have repair of hernia by general surgery, Dr. Kae Heller on Friday, 10/31/2022 who had presented to preop testing and noted to have had intractable nausea vomiting and worsening periumbilical and right lower quadrant abdominal pain over the past 2 to 3 days with some associated shortness of breath, diarrhea, lightheadedness, right flank pain in addition.  Patient denies any fevers, no chills, no chest pain, no constipation, no melena, no hematemesis, no hematochezia.  Patient seen at preop testing, call was made to general surgery's office and patient advised to present to the ED.  Patient noted to have been seen in the ED on 10/07/2022 with similar symptoms.   ED Course: Patient seen in the ED, comprehensive metabolic profile obtained with a sodium of 127, potassium of 5.3, chloride of 96, glucose of 102, creatinine of 1.35, albumin of 3.4 otherwise within normal limits.  Lactic acid level noted at 1.3.  CBC unremarkable.  Repeat CT abdomen and pelvis done with moderate fat-containing periumbilical hernia is not substantially changed with persistent increased density, favored to reflect marked fat stranding rather than underlying soft tissue lesion.  Slightly increased small volume free fluid and mesenteric stranding nonspecific may be reactive, small hiatal hernia, colonic diverticulosis without acute diverticulitis, aortic atherosclerosis.  ED PA spoke with general surgery who had recommended medical admission and general surgery will consult in the morning.  Assessment & Plan:   Principal Problem:   Intractable nausea and vomiting Active Problems:   Incarcerated hernia    Atrial fibrillation with RVR (HCC)   Hyponatremia   Essential hypertension   Hypothyroidism   HLD (hyperlipidemia)   Chronic combined systolic and diastolic heart failure (HCC)   Dehydration   Hyperkalemia   Umbilical hernia without obstruction or gangrene   #1 intractable nausea and vomiting likely secondary to incarcerated umbilical hernia gallbladder dysfunction -Patient presented with intractable nausea vomiting periumbilical pain and right lower quadrant pain worsening over the past 2 to 3 days with inability to keep anything down. -Patient with significant abdominal pain, right lower quadrant on exam, Harding umbilical hernia which is not reducible. -CT abdomen and pelvis done with moderate fat-containing periumbilical hernia is not substantially changed with persistent increased density, favored to reflect marked fat stranding rather than underlying soft tissue lesion. -General surgery following diagnostic laparoscopy and cholecystectomy with umbilical hernia repair tomorrow.    2.  Hyponatremia stable at 131 -Likely secondary to hypovolemic hyponatremia in the setting of intractable nausea and vomiting x 2 to 3 days, diuretics of Lasix, spironolactone, Entresto. -Hold antihypertensive medications of spironolactone and Entresto and Lasix. -IV fluids.   3.  Hyperkalemia resolved -Likely secondary to spironolactone, Entresto. -4.  Hypothyroidism Placed on home regimen Synthroid '1 1 2 '$ mcg IV daily.   5.  A-fib status post PPM -Resume home regimen amiodarone.  Hold Toprol-XL and placed on Lopressor 5 mg IV every 8 hours.  eliquis on hold  6.  GERD -IV PPI.   7.  Hyperlipidemia -Hold statin.  8.  CKD stage IIIb -Stable.  Pressure Injury 12/18/21 Sacrum Mid Stage 2 -  Partial thickness loss of dermis presenting as a shallow open injury with a red, pink  wound bed without slough. prior moisture associated skin damage now open (Active)  12/18/21 1900  Location: Sacrum   Location Orientation: Mid  Staging: Stage 2 -  Partial thickness loss of dermis presenting as a shallow open injury with a red, pink wound bed without slough.  Wound Description (Comments): prior moisture associated skin damage now open  Present on Admission: No     Pressure Injury 10/28/22 Buttocks Mid Stage 1 -  Intact skin with non-blanchable redness of a localized area usually over a bony prominence. (Active)  10/28/22 0800  Location: Buttocks  Location Orientation: Mid  Staging: Stage 1 -  Intact skin with non-blanchable redness of a localized area usually over a bony prominence.  Wound Description (Comments):   Present on Admission: Yes  Dressing Type Foam - Lift dressing to assess site every shift 10/28/22 2000     Estimated body mass index is 31.74 kg/m as calculated from the following:   Height as of this encounter: '5\' 6"'$  (1.676 m).   Weight as of this encounter: 89.2 kg.  DVT prophylaxis:      SCDs Code Status:              DNR Family Communication:       Updated patient, daughter [name] at bedside. Disposition Plan:              Patient is from:                        Home             Anticipated DC to:                   Home             Anticipated DC date:               TBD             Anticipated DC barriers:         Clinical improvement            Consults called:        General surgery Admission status:     Admit to progressive care/inpatient Subjective:  Vomited after taking Ensure yesterday No nausea or vomiting after drinking clear liquids today Objective: Vitals:   10/29/22 0419 10/29/22 0500 10/29/22 0839 10/29/22 1150  BP: (!) 108/48   (!) 116/46  Pulse: 63   (!) 59  Resp: 20   19  Temp: 98.2 F (36.8 C)   97.7 F (36.5 C)  TempSrc: Oral   Oral  SpO2: 95%  95% 97%  Weight:  89.2 kg    Height:        Intake/Output Summary (Last 24 hours) at 10/29/2022 1424 Last data filed at 10/29/2022 1043 Gross per 24 hour  Intake 1005.01 ml  Output 1 ml   Net 1004.01 ml   Filed Weights   10/27/22 1332 10/28/22 0712 10/29/22 0500  Weight: 86.6 kg 87 kg 89.2 kg    Examination:  General exam: Appears calm and comfortable  Respiratory system: Clear to auscultation. Respiratory effort normal. Cardiovascular system: S1 & S2 heard, RRR. No JVD, murmurs, rubs, gallops or clicks. No pedal edema. Gastrointestinal system: Abdomen is nondistended, soft and umbilical hernia tender. No organomegaly or masses felt. Normal bowel sounds heard. Central nervous system: Alert and oriented. No focal neurological deficits. Extremities: Symmetric 5 x 5 power. Skin: No rashes, lesions  or ulcers Psychiatry: Judgement and insight appear normal. Mood & affect appropriate.   Data Reviewed: I have personally reviewed following labs and imaging studies  CBC: Recent Labs  Lab 10/27/22 1420 10/27/22 1437 10/27/22 1907 10/28/22 0740 10/29/22 0414  WBC 7.4  --  7.3 6.5 5.7  NEUTROABS  --   --  5.0  --  3.7  HGB 12.6 13.6 11.6* 12.6 10.5*  HCT 38.9 40.0 35.9* 39.2 33.2*  MCV 87.4  --  86.9 88.1 90.2  PLT 287  --  255 290 932   Basic Metabolic Panel: Recent Labs  Lab 10/27/22 1420 10/27/22 1437 10/27/22 1907 10/28/22 0740 10/29/22 0414  NA 127* 127* 128* 131* 131*  K 5.3* 5.6* 4.5 4.7 4.6  CL 96* 95* 97* 99 103  CO2 21*  --  20* 22 20*  GLUCOSE 102* 101* 91 88 116*  BUN '13 16 13 13 9  '$ CREATININE 1.35* 1.30* 1.34* 1.39* 1.28*  CALCIUM 8.8*  --  8.5* 9.2 8.1*  MG  --   --  2.0  --  1.7  PHOS  --   --  2.8  --   --    GFR: Estimated Creatinine Clearance: 33.5 mL/min (A) (by C-G formula based on SCr of 1.28 mg/dL (H)). Liver Function Tests: Recent Labs  Lab 10/27/22 1420 10/27/22 1907 10/28/22 0740 10/29/22 0414  AST '16 17 16 '$ 13*  ALT '9 9 9 8  '$ ALKPHOS 74 71 79 56  BILITOT 0.6 0.7 0.6 0.5  PROT 7.0 6.8 7.3 5.4*  ALBUMIN 3.4* 3.1* 3.5 2.6*   Recent Labs  Lab 10/27/22 1420  LIPASE 27   No results for input(s): "AMMONIA" in the last  168 hours. Coagulation Profile: No results for input(s): "INR", "PROTIME" in the last 168 hours. Cardiac Enzymes: No results for input(s): "CKTOTAL", "CKMB", "CKMBINDEX", "TROPONINI" in the last 168 hours. BNP (last 3 results) No results for input(s): "PROBNP" in the last 8760 hours. HbA1C: No results for input(s): "HGBA1C" in the last 72 hours. CBG: Recent Labs  Lab 10/28/22 0742  GLUCAP 89   Lipid Profile: No results for input(s): "CHOL", "HDL", "LDLCALC", "TRIG", "CHOLHDL", "LDLDIRECT" in the last 72 hours. Thyroid Function Tests: No results for input(s): "TSH", "T4TOTAL", "FREET4", "T3FREE", "THYROIDAB" in the last 72 hours. Anemia Panel: No results for input(s): "VITAMINB12", "FOLATE", "FERRITIN", "TIBC", "IRON", "RETICCTPCT" in the last 72 hours. Sepsis Labs: Recent Labs  Lab 10/27/22 1420  LATICACIDVEN 1.3    Recent Results (from the past 240 hour(s))  Urine Culture     Status: Abnormal   Collection Time: 10/27/22  8:48 PM   Specimen: Urine, Catheterized  Result Value Ref Range Status   Specimen Description   Final    URINE, CATHETERIZED Performed at [facility] [address] [place], [place] [address]    Special Requests   Final    NONE Performed at [facility], [place] [address], [place], [place] [address]    Culture MULTIPLE SPECIES PRESENT, SUGGEST RECOLLECTION (A)  Final   Report Status 10/29/2022 FINAL  Final         Radiology Studies: NM Hepato W/EF  Result Date: 10/28/2022 CLINICAL DATA:  Abdominal pain with nausea and vomiting EXAM: NUCLEAR MEDICINE HEPATOBILIARY IMAGING WITH GALLBLADDER EF TECHNIQUE: Sequential images of the abdomen were obtained out to 60 minutes following intravenous administration of radiopharmaceutical. After oral ingestion of Ensure, gallbladder ejection fraction was determined. At 60 min, normal ejection fraction is greater  than 33%. RADIOPHARMACEUTICALS:  5.2 mCi Tc-60m Choletec IV  COMPARISON:  CT 10/27/2022 FINDINGS: Prompt uptake and biliary excretion of activity by the liver is seen. Gallbladder activity is visualized, consistent with patency of cystic duct. Biliary activity passes into small bowel, consistent with patent common bile duct. Calculated gallbladder ejection fraction is 29%. (Normal gallbladder ejection fraction with Ensure is greater than 33%.) IMPRESSION: 1. Patency of the cystic duct and common bile duct without scintigraphic evidence of cholecystitis. 2. Gallbladder dysfunction with a decreased ejection fraction of 29%. Electronically Signed   By: NDavina PokeD.O.   On: 10/28/2022 14:28   [place]Abdomen Limited RUQ (LIVER/GB)  Result Date: 10/28/2022 CLINICAL DATA:  Cholecystitis EXAM: ULTRASOUND ABDOMEN LIMITED RIGHT UPPER QUADRANT COMPARISON:  CT 10/27/2022 FINDINGS: Gallbladder: The gallbladder is mildly distended without wall thickening or pericholecystic fluid. Negative sonographic Murphy sign. No gallstones identified. Common bile duct: Diameter: 3.0 mm, normal.  No intrahepatic ductal dilation. Liver: No focal lesion identified. Within normal limits in parenchymal echogenicity. Portal vein is patent on color Doppler imaging with normal direction of blood flow towards the liver. Other: Limited exam due to body habitus and bowel gas. IMPRESSION: No evidence of acute cholecystitis or biliary obstruction. Electronically Signed   By: JMaurine SimmeringM.D.   On: 10/28/2022 11:59   CT ABDOMEN PELVIS W CONTRAST  Result Date: 10/27/2022 CLINICAL DATA:  Increased periumbilical pain, nausea, and vomiting in the setting of known hernia EXAM: CT ABDOMEN AND PELVIS WITH CONTRAST TECHNIQUE: Multidetector CT imaging of the abdomen and pelvis was performed using the standard protocol following bolus administration of intravenous contrast. RADIATION DOSE REDUCTION: This exam was performed according to the departmental dose-optimization program which includes automated exposure  control, adjustment of the mA and/or kV according to patient size and/or use of iterative reconstruction technique. CONTRAST:  1074mOMNIPAQUE IOHEXOL 300 MG/ML  SOLN COMPARISON:  CT abdomen and pelvis dated 10/07/2022 FINDINGS: Lower chest: No focal consolidation or pulmonary nodule in the lung bases. No pleural effusion or pneumothorax demonstrated. Partially imaged heart size is normal. Partially imaged pacemaker leads terminate in the right atrium and ventricle. Hepatobiliary: Subcentimeter hypodensity in segment 4 (2:22), unchanged, is too small to characterize. No intra or extrahepatic biliary ductal dilation. Normal gallbladder. Pancreas: No focal lesions or main ductal dilation. Spleen: Normal in size without focal abnormality. Adrenals/Urinary Tract: No adrenal nodules. No suspicious renal mass, calculi or hydronephrosis. Unchanged simple fluid attenuation focus adjacent to the right kidney measuring 5.2 cm (2:39). No focal bladder wall thickening. Stomach/Bowel: Small hiatal hernia. Normal appearance of the stomach. No evidence of bowel wall thickening, distention, or inflammatory changes. Colonic diverticulosis without acute diverticulitis. Appendectomy. Vascular/Lymphatic: Aortic atherosclerosis. Circumaortic left renal vein. No enlarged abdominal or pelvic lymph nodes. Reproductive: No adnexal masses. Other: Slightly increased small volume free fluid and mesenteric stranding. Free air. Moderate fat-containing paraumbilical hernia is not substantially changed with persistent increased density. Musculoskeletal: No acute or abnormal lytic or blastic osseous lesions. IMPRESSION: 1. Moderate fat-containing paraumbilical hernia is not substantially changed with persistent increased density, favored to reflect marked fat stranding rather than underlying soft tissue lesion. 2. Slightly increased small volume free fluid and mesenteric stranding, nonspecific, may be reactive. 3. Small hiatal hernia. 4. Colonic  diverticulosis without acute diverticulitis. 5.  Aortic Atherosclerosis (ICD10-I70.0). Electronically Signed   By: LiDarrin Nipper.D.   On: 10/27/2022 15:51     Scheduled Meds:  amiodarone  200 mg Oral Daily   heparin injection (subcutaneous)  5,000 Units Subcutaneous Q8H   [START ON 11/03/2022] levothyroxine  112 mcg Intravenous Daily   metoprolol tartrate  5 mg Intravenous Q8H   mometasone-formoterol  2 puff Inhalation BID   pantoprazole (PROTONIX) IV  40 mg Intravenous Q24H   Continuous Infusions:  dextrose 5 % and 0.9% NaCl 100 mL/hr at 10/29/22 0410     LOS: 2 days    Time spent: 28 min  Georgette Shell, MD 10/29/2022, 2:24 PM

## 2022-10-29 NOTE — H&P (View-Only) (Signed)
Patient seen this afternoon. Symptoms unchanged. [place] and HIDA reviewed- mild biliary dyskinesia. I discussed with her and her daughter the results of these tests.  I recommend proceeding with diagnostic laparoscopy to exclude any other gross intraabdominal pathology that could explain her pain or nausea (given findings of "misty mesentery" and sligh increase in free fluid noted on serial CT scans), cholecystectomy, and primary repair of the chronically incarcerated fat-containing umbilical hernia. I discussed the procedure in detail including relevant technical aspects, relevant anatomy, and risks of bleeding, infection, pain, scarring, injury to intra-abdominal structures, need to convert to open surgery, bile duct injury and sequelae, bile leak, subtotal cholecystectomy, poor wound healing or undesired cosmetic result, seroma/hematoma, hernia recurrence, as well as cardiovascular/pulmonary/thromboembolic risks. I emphasized to the patient and her daughter that her risk of complications is higher than average due to her age and medical comorbidities.  I think this has been optimized is much as possible and she has been cleared by cardiology to proceed with surgery. We also discussed that it is possible that removing her gallbladder and fixing her hernia will not completely resolve her symptoms, in which case further investigation and probable endoscopic evaluation would be the next steps.  Questions were welcomed and answered to their satisfaction.  They both wish to proceed and understand the risks involved.

## 2022-10-29 NOTE — Progress Notes (Signed)
OT Cancellation Note  Patient Details Name: [name] PIDCOCK MRN: [id number] DOB: [birth year]   Cancelled Treatment:    Reason Eval/Treat Not Completed: Patient declined, no reason specified. Patient declined OT evaluation citing pain when she moves. Plan is for repair of incarcerated umbilical hernia and cholecystectomy tomorrow in OR. Will f/u with patient s/p surgery.  Sabian Kuba L Vanetta Rule 10/29/2022, 3:41 PM

## 2022-10-29 NOTE — Progress Notes (Signed)
Initial Nutrition Assessment  DOCUMENTATION CODES:   Not applicable  INTERVENTION:  - Clear Liquid diet per MD. Advance as medically appropriate.  - Boost Breeze po BID, each supplement provides 250 kcal and 9 grams of protein - Encourage intake as tolerated.  - Monitor weight trends.   NUTRITION DIAGNOSIS:   Inadequate oral intake related to acute illness, decreased appetite, nausea, vomiting as evidenced by per patient/family report.  GOAL:   Patient will meet greater than or equal to 90% of their needs  MONITOR:   PO intake, Supplement acceptance, Diet advancement, Weight trends  REASON FOR ASSESSMENT:   Malnutrition Screening Tool    ASSESSMENT:   89 y.o. female with medical history significant of chronic combined syst and diastolic CHF, HTN, CKD stage III, Afib s/p PPM, GERD, hx of incarcerated umbilical hernia who was scheduled to have repair of hernia on 10/31/2022 but presented to preop testing with intractable nausea vomiting and worsening periumbilical and right lower quadrant abdominal pain over the past 2 to 3 days with associated SOB, diarrhea, lightheadedness, right flank pain in addition.  Patient in bed at time of visit, daughter at bedside who helped provide nutrition history. Reported UBW to be 194# and that patient has lost weight over the past month since being sick and having a decreased appetite. Per EMR, no significant changes in weight over the past year.  Daughter reports patient usually has a light breakfast (cereal) and lunch (cheese crackers) and has her biggest meal at dinner time. Does not drink any nutrition supplements at home. Over the past few weeks she has not been eating well due to decreased appetite and ongoing nausea and vomiting.  Thankfully, this has improved a little since admission. Patient on clear liquids and reports tolerating fairly well. Drinking ginger ale at time of visit.  Patient agreeable to try Boost Breeze to support intake  while on clear liquids.  Medications reviewed and include: -  Labs reviewed:  Na 131 Creatinine 1.28   NUTRITION - FOCUSED PHYSICAL EXAM:  Flowsheet Row Most Recent Value  Orbital Region Mild depletion  Upper Arm Region No depletion  Thoracic and Lumbar Region No depletion  Buccal Region No depletion  Temple Region Mild depletion  Clavicle Bone Region No depletion  Clavicle and Acromion Bone Region No depletion  Scapular Bone Region Unable to assess  Dorsal Hand No depletion  Patellar Region No depletion  Anterior Thigh Region No depletion  Posterior Calf Region No depletion  Edema (RD Assessment) None  Hair Reviewed  Eyes Reviewed  Mouth Reviewed  Skin Reviewed  Nails Reviewed       Diet Order:   Diet Order             Diet NPO time specified Except for: Sips with Meds  Diet effective midnight           Diet clear liquid Room service appropriate? Yes; Fluid consistency: Thin  Diet effective now                   EDUCATION NEEDS:  Education needs have been addressed  Skin:  Skin Assessment: Skin Integrity Issues: Skin Integrity Issues:: Stage I Stage I: Buttocks  Last BM:  unknown  Height:  Ht Readings from Last 1 Encounters:  10/28/22 '5\' 6"'$  (1.676 m)   Weight:  Wt Readings from Last 1 Encounters:  10/29/22 89.2 kg    BMI:  Body mass index is 31.74 kg/m.  Estimated Nutritional Needs:  Kcal:  1850-2000 kcals Protein:  90-105 grams Fluid:  >/= 1.8L    Samson Frederic RD, LDN For contact information, refer to [facility].

## 2022-10-30 ENCOUNTER — Ambulatory Visit (HOSPITAL_COMMUNITY): Admission: RE | Admit: 2022-10-30 | Payer: Medicare HMO | Source: Home / Self Care | Admitting: Surgery

## 2022-10-30 ENCOUNTER — Other Ambulatory Visit: Payer: Self-pay

## 2022-10-30 ENCOUNTER — Encounter (HOSPITAL_COMMUNITY): Payer: Self-pay | Admitting: Internal Medicine

## 2022-10-30 ENCOUNTER — Encounter (HOSPITAL_COMMUNITY)
Admission: EM | Disposition: A | Discharge status: Home / Self Care | Payer: Self-pay | Source: Home / Self Care | Attending: Internal Medicine

## 2022-10-30 ENCOUNTER — Inpatient Hospital Stay (HOSPITAL_COMMUNITY): Payer: Medicare HMO | Admitting: Certified Registered"

## 2022-10-30 ENCOUNTER — Encounter: Payer: Self-pay | Admitting: Cardiology

## 2022-10-30 DIAGNOSIS — R112 Nausea with vomiting, unspecified: Secondary | ICD-10-CM | POA: Diagnosis not present

## 2022-10-30 DIAGNOSIS — K828 Other specified diseases of gallbladder: Secondary | ICD-10-CM

## 2022-10-30 DIAGNOSIS — D638 Anemia in other chronic diseases classified elsewhere: Secondary | ICD-10-CM

## 2022-10-30 DIAGNOSIS — K420 Umbilical hernia with obstruction, without gangrene: Secondary | ICD-10-CM

## 2022-10-30 DIAGNOSIS — E039 Hypothyroidism, unspecified: Secondary | ICD-10-CM

## 2022-10-30 DIAGNOSIS — K660 Peritoneal adhesions (postprocedural) (postinfection): Secondary | ICD-10-CM

## 2022-10-30 HISTORY — PX: CHOLECYSTECTOMY: SHX55

## 2022-10-30 HISTORY — PX: UMBILICAL HERNIA REPAIR: SHX196

## 2022-10-30 LAB — COMPREHENSIVE METABOLIC PANEL
ALT: 10 U/L (ref 0–44)
AST: 17 U/L (ref 15–41)
Albumin: 3.2 g/dL — ABNORMAL LOW (ref 3.5–5.0)
Alkaline Phosphatase: 78 U/L (ref 38–126)
Anion gap: 7 (ref 5–15)
BUN: 6 mg/dL — ABNORMAL LOW (ref 8–23)
CO2: 16 mmol/L — ABNORMAL LOW (ref 22–32)
Calcium: 8.4 mg/dL — ABNORMAL LOW (ref 8.9–10.3)
Chloride: 109 mmol/L (ref 98–111)
Creatinine, Ser: 1.01 mg/dL — ABNORMAL HIGH (ref 0.44–1.00)
GFR, Estimated: 53 mL/min — ABNORMAL LOW (ref >60)
Glucose, Bld: 138 mg/dL — ABNORMAL HIGH (ref 70–99)
Potassium: 3.8 mmol/L (ref 3.5–5.1)
Sodium: 132 mmol/L — ABNORMAL LOW (ref 135–145)
Total Bilirubin: 0.3 mg/dL (ref 0.3–1.2)
Total Protein: 6.3 g/dL — ABNORMAL LOW (ref 6.5–8.1)

## 2022-10-30 LAB — CBC
HCT: 39.5 % (ref 36.0–46.0)
Hemoglobin: 12.1 g/dL (ref 12.0–15.0)
MCH: 28.3 pg (ref 26.0–34.0)
MCHC: 30.6 g/dL (ref 30.0–36.0)
MCV: 92.5 fL (ref 80.0–100.0)
Platelets: 261 10*3/uL (ref 150–400)
RBC: 4.27 MIL/uL (ref 3.87–5.11)
RDW: 13.2 % (ref 11.5–15.5)
WBC: 8.5 10*3/uL (ref 4.0–10.5)
nRBC: 0 % (ref 0.0–0.2)

## 2022-10-30 LAB — APTT: aPTT: 47 seconds — ABNORMAL HIGH (ref 24–36)

## 2022-10-30 LAB — GLUCOSE, CAPILLARY: Glucose-Capillary: 92 mg/dL (ref 70–99)

## 2022-10-30 LAB — HEPARIN LEVEL (UNFRACTIONATED): Heparin Unfractionated: >1.1 IU/mL — ABNORMAL HIGH (ref 0.30–0.70)

## 2022-10-30 SURGERY — REPAIR, HERNIA, UMBILICAL, ADULT
Anesthesia: General | Site: Abdomen

## 2022-10-30 MED ORDER — BUPIVACAINE-EPINEPHRINE 0.25% -1:200000 IJ SOLN
INTRAMUSCULAR | Status: DC | PRN
  Administered 2022-10-30: 10 mL

## 2022-10-30 MED ORDER — CEFAZOLIN SODIUM-DEXTROSE 2-4 GM/100ML-% IV SOLN
2.0000 g | INTRAVENOUS | Status: AC
  Administered 2022-10-30: 2 g via INTRAVENOUS
  Filled 2022-10-30: qty 100

## 2022-10-30 MED ORDER — HYDRALAZINE HCL 20 MG/ML IJ SOLN
INTRAMUSCULAR | Status: DC | PRN
  Administered 2022-10-30 (×2): 5 mg via INTRAVENOUS

## 2022-10-30 MED ORDER — BUPIVACAINE HCL 0.25 % IJ SOLN
INTRAMUSCULAR | Status: AC
  Filled 2022-10-30: qty 1

## 2022-10-30 MED ORDER — FENTANYL CITRATE PF 50 MCG/ML IJ SOSY
PREFILLED_SYRINGE | INTRAMUSCULAR | Status: AC
  Filled 2022-10-30: qty 1

## 2022-10-30 MED ORDER — LIDOCAINE 2% (20 MG/ML) 5 ML SYRINGE
INTRAMUSCULAR | Status: DC | PRN
  Administered 2022-10-30: 90 mg via INTRAVENOUS

## 2022-10-30 MED ORDER — PROPOFOL 10 MG/ML IV BOLUS
INTRAVENOUS | Status: DC | PRN
  Administered 2022-10-30: 80 mg via INTRAVENOUS

## 2022-10-30 MED ORDER — ACETAMINOPHEN 500 MG PO TABS
1000.0000 mg | ORAL_TABLET | ORAL | Status: AC
  Administered 2022-10-30: 1000 mg via ORAL
  Filled 2022-10-30: qty 2

## 2022-10-30 MED ORDER — HYDRALAZINE HCL 20 MG/ML IJ SOLN
INTRAMUSCULAR | Status: AC
  Filled 2022-10-30: qty 1

## 2022-10-30 MED ORDER — LACTATED RINGERS IV SOLN: Freq: Once | INTRAVENOUS | Status: DC

## 2022-10-30 MED ORDER — FENTANYL CITRATE (PF) 100 MCG/2ML IJ SOLN
INTRAMUSCULAR | Status: AC
  Filled 2022-10-30: qty 2

## 2022-10-30 MED ORDER — HEPARIN (PORCINE) 25000 UT/250ML-% IV SOLN: 1100.0000 [IU]/h | INTRAVENOUS | Status: DC

## 2022-10-30 MED ORDER — 0.9 % SODIUM CHLORIDE (POUR BTL) OPTIME
TOPICAL | Status: DC | PRN
  Administered 2022-10-30: 1000 mL

## 2022-10-30 MED ORDER — FENTANYL CITRATE PF 50 MCG/ML IJ SOSY
25.0000 ug | PREFILLED_SYRINGE | INTRAMUSCULAR | Status: DC | PRN
  Administered 2022-10-30 (×2): 50 ug via INTRAVENOUS

## 2022-10-30 MED ORDER — LACTATED RINGERS IV SOLN
INTRAVENOUS | Status: AC | PRN
  Administered 2022-10-30: 1000 mL via INTRAVENOUS

## 2022-10-30 MED ORDER — ROCURONIUM BROMIDE 10 MG/ML (PF) SYRINGE
PREFILLED_SYRINGE | INTRAVENOUS | Status: DC | PRN
  Administered 2022-10-30: 60 mg via INTRAVENOUS

## 2022-10-30 MED ORDER — HEPARIN SODIUM (PORCINE) 5000 UNIT/ML IJ SOLN
5000.0000 [IU] | Freq: Three times a day (TID) | INTRAMUSCULAR | Status: DC
  Administered 2022-10-30 – 2022-10-31 (×2): 5000 [IU] via SUBCUTANEOUS
  Filled 2022-10-30 (×2): qty 1

## 2022-10-30 MED ORDER — DEXAMETHASONE SODIUM PHOSPHATE 10 MG/ML IJ SOLN
INTRAMUSCULAR | Status: AC
  Filled 2022-10-30: qty 1

## 2022-10-30 MED ORDER — ROCURONIUM BROMIDE 10 MG/ML (PF) SYRINGE
PREFILLED_SYRINGE | INTRAVENOUS | Status: AC
  Filled 2022-10-30: qty 10

## 2022-10-30 MED ORDER — PROPOFOL 10 MG/ML IV BOLUS
INTRAVENOUS | Status: AC
  Filled 2022-10-30: qty 20

## 2022-10-30 MED ORDER — SUGAMMADEX SODIUM 200 MG/2ML IV SOLN
INTRAVENOUS | Status: DC | PRN
  Administered 2022-10-30: 200 mg via INTRAVENOUS

## 2022-10-30 MED ORDER — HEPARIN BOLUS VIA INFUSION
3000.0000 [IU] | Freq: Once | INTRAVENOUS | Status: DC
  Filled 2022-10-30: qty 3000

## 2022-10-30 MED ORDER — FENTANYL CITRATE (PF) 100 MCG/2ML IJ SOLN
INTRAMUSCULAR | Status: DC | PRN
  Administered 2022-10-30 (×4): 25 ug via INTRAVENOUS

## 2022-10-30 MED ORDER — DEXAMETHASONE SODIUM PHOSPHATE 10 MG/ML IJ SOLN
INTRAMUSCULAR | Status: DC | PRN
  Administered 2022-10-30: 4 mg via INTRAVENOUS

## 2022-10-30 MED ORDER — ORAL CARE MOUTH RINSE: 15.0000 mL | Freq: Once | OROMUCOSAL | Status: DC

## 2022-10-30 MED ORDER — CHLORHEXIDINE GLUCONATE 0.12 % MT SOLN: 15.0000 mL | Freq: Once | OROMUCOSAL | Status: DC

## 2022-10-30 MED ORDER — BUPIVACAINE LIPOSOME 1.3 % IJ SUSP: 20.0000 mL | Freq: Once | INTRAMUSCULAR | Status: DC

## 2022-10-30 MED ORDER — CHLORHEXIDINE GLUCONATE 4 % EX LIQD: 60.0000 mL | Freq: Once | CUTANEOUS | Status: DC

## 2022-10-30 MED ORDER — LACTATED RINGERS IV SOLN: INTRAVENOUS | Status: DC

## 2022-10-30 MED ORDER — ONDANSETRON HCL 4 MG/2ML IJ SOLN
INTRAMUSCULAR | Status: DC | PRN
  Administered 2022-10-30: 4 mg via INTRAVENOUS

## 2022-10-30 MED ORDER — ONDANSETRON HCL 4 MG/2ML IJ SOLN
INTRAMUSCULAR | Status: AC
  Filled 2022-10-30: qty 2

## 2022-10-30 SURGICAL SUPPLY — 64 items
ADH SKN CLS APL DERMABOND .7 (GAUZE/BANDAGES/DRESSINGS) ×1
APL PRP STRL LF DISP 70% ISPRP (MISCELLANEOUS) ×1
APL SKNCLS STERI-STRIP NONHPOA (GAUZE/BANDAGES/DRESSINGS) ×1
APPLIER CLIP ROT 10 11.4 M/L (STAPLE) ×1
APR CLP MED LRG 11.4X10 (STAPLE) ×1
BAG COUNTER SPONGE SURGICOUNT (BAG) IMPLANT
BAG SPNG CNTER NS LX DISP (BAG)
BENZOIN TINCTURE PRP APPL 2/3 (GAUZE/BANDAGES/DRESSINGS) IMPLANT
CABLE HIGH FREQUENCY MONO STRZ (ELECTRODE) ×1 IMPLANT
CHLORAPREP W/TINT 26 (MISCELLANEOUS) ×1 IMPLANT
CLIP APPLIE ROT 10 11.4 M/L (STAPLE) ×1 IMPLANT
COVER MAYO STAND STRL (DRAPES) IMPLANT
COVER SURGICAL LIGHT HANDLE (MISCELLANEOUS) ×1 IMPLANT
DERMABOND ADVANCED .7 DNX12 (GAUZE/BANDAGES/DRESSINGS) ×1 IMPLANT
DRAPE C-ARM 42X120 X-RAY (DRAPES) IMPLANT
DRAPE LAPAROSCOPIC ABDOMINAL (DRAPES) ×1 IMPLANT
DRSG TEGADERM 2-3/8X2-3/4 SM (GAUZE/BANDAGES/DRESSINGS) IMPLANT
DRSG TEGADERM 4X4.75 (GAUZE/BANDAGES/DRESSINGS) IMPLANT
ELECT PENCIL ROCKER SW 15FT (MISCELLANEOUS) IMPLANT
ELECT REM PT RETURN 15FT ADLT (MISCELLANEOUS) ×1 IMPLANT
GAUZE SPONGE 2X2 8PLY STRL LF (GAUZE/BANDAGES/DRESSINGS) IMPLANT
GAUZE SPONGE 4X4 12PLY STRL (GAUZE/BANDAGES/DRESSINGS) IMPLANT
GLOVE BIO SURGEON STRL SZ 6 (GLOVE) ×1 IMPLANT
GLOVE INDICATOR 6.5 STRL GRN (GLOVE) ×1 IMPLANT
GLOVE SS BIOGEL STRL SZ 6 (GLOVE) ×1 IMPLANT
GOWN STRL REUS W/ TWL LRG LVL3 (GOWN DISPOSABLE) ×1 IMPLANT
GOWN STRL REUS W/ TWL XL LVL3 (GOWN DISPOSABLE) IMPLANT
GOWN STRL REUS W/TWL LRG LVL3 (GOWN DISPOSABLE) ×1
GOWN STRL REUS W/TWL XL LVL3 (GOWN DISPOSABLE)
GRASPER SUT TROCAR 14GX15 (MISCELLANEOUS) ×1 IMPLANT
HEMOSTAT SNOW SURGICEL 2X4 (HEMOSTASIS) IMPLANT
IRRIG SUCT STRYKERFLOW 2 WTIP (MISCELLANEOUS) ×1
IRRIGATION SUCT STRKRFLW 2 WTP (MISCELLANEOUS) ×1 IMPLANT
KIT BASIN OR (CUSTOM PROCEDURE TRAY) ×1 IMPLANT
KIT TURNOVER KIT A (KITS) IMPLANT
NDL INSUFFLATION 14GA 120MM (NEEDLE) ×1 IMPLANT
NEEDLE HYPO 22GX1.5 SAFETY (NEEDLE) IMPLANT
NEEDLE INSUFFLATION 14GA 120MM (NEEDLE) ×1 IMPLANT
PACK GENERAL/GYN (CUSTOM PROCEDURE TRAY) ×1 IMPLANT
PENCIL SMOKE EVACUATOR (MISCELLANEOUS) IMPLANT
SCISSORS LAP 5X35 DISP (ENDOMECHANICALS) ×1 IMPLANT
SET CHOLANGIOGRAPH MIX (MISCELLANEOUS) IMPLANT
SET TUBE SMOKE EVAC HIGH FLOW (TUBING) ×1 IMPLANT
SLEEVE Z-THREAD 5X100MM (TROCAR) ×1 IMPLANT
SPIKE FLUID TRANSFER (MISCELLANEOUS) ×1 IMPLANT
STRIP CLOSURE SKIN 1/2X4 (GAUZE/BANDAGES/DRESSINGS) IMPLANT
SUT ETHIBOND 0 MO6 C/R (SUTURE) IMPLANT
SUT MNCRL AB 4-0 PS2 18 (SUTURE) ×1 IMPLANT
SUT PDS AB 1 CT1 27 (SUTURE) IMPLANT
SUT PROLENE 2 0 CT2 30 (SUTURE) IMPLANT
SUT VIC AB 3-0 SH 27 (SUTURE) ×1
SUT VIC AB 3-0 SH 27XBRD (SUTURE) IMPLANT
SUT VICRYL AB 3 0 TIES (SUTURE) IMPLANT
SYR CONTROL 10ML LL (SYRINGE) IMPLANT
SYS BAG RETRIEVAL 10MM (BASKET) ×1
SYS WOUND ALEXIS 18CM MED (MISCELLANEOUS) ×1
SYSTEM BAG RETRIEVAL 10MM (BASKET) ×1 IMPLANT
SYSTEM WOUND ALEXIS 18CM MED (MISCELLANEOUS) IMPLANT
TOWEL OR 17X26 10 PK STRL BLUE (TOWEL DISPOSABLE) ×1 IMPLANT
TOWEL OR NON WOVEN STRL DISP B (DISPOSABLE) ×1 IMPLANT
TRAY LAPAROSCOPIC (CUSTOM PROCEDURE TRAY) ×1 IMPLANT
TROCAR ADV FIXATION 12X100MM (TROCAR) ×1 IMPLANT
TROCAR XCEL NON-BLD 5MMX100MML (ENDOMECHANICALS) IMPLANT
TROCAR Z-THREAD OPTICAL 5X100M (TROCAR) ×1 IMPLANT

## 2022-10-30 NOTE — Progress Notes (Signed)
PROGRESS NOTE    [name]  ZOX:[id number] DOB: 1930-10-11 DOA: 10/27/2022 PCP: Merrilee Seashore, MD    Brief Narrative:  [name] is a 89 y.o. female with medical history significant of chronic combined syst and diastolic CHF, HTN, CKD stage III, Afib s/p PPM, GERD, hx of incarcerated umbilical hernia who was to have repair of hernia by general surgery, Dr. Kae Heller on Friday, 10/31/2022 who had presented to preop testing and noted to have had intractable nausea vomiting and worsening periumbilical and right lower quadrant abdominal pain over the past 2 to 3 days with some associated shortness of breath, diarrhea, lightheadedness, right flank pain in addition.  Patient denies any fevers, no chills, no chest pain, no constipation, no melena, no hematemesis, no hematochezia.  Patient seen at preop testing, call was made to general surgery's office and patient advised to present to the ED.  Patient noted to have been seen in the ED on 10/07/2022 with similar symptoms.   ED Course: Patient seen in the ED, comprehensive metabolic profile obtained with a sodium of 127, potassium of 5.3, chloride of 96, glucose of 102, creatinine of 1.35, albumin of 3.4 otherwise within normal limits.  Lactic acid level noted at 1.3.  CBC unremarkable.  Repeat CT abdomen and pelvis done with moderate fat-containing periumbilical hernia is not substantially changed with persistent increased density, favored to reflect marked fat stranding rather than underlying soft tissue lesion.  Slightly increased small volume free fluid and mesenteric stranding nonspecific may be reactive, small hiatal hernia, colonic diverticulosis without acute diverticulitis, aortic atherosclerosis.  ED PA spoke with general surgery who had recommended medical admission and general surgery will consult in the morning.  Assessment & Plan:   Principal Problem:   Intractable nausea and vomiting Active Problems:   Incarcerated hernia    Atrial fibrillation with RVR (HCC)   Hyponatremia   Essential hypertension   Hypothyroidism   HLD (hyperlipidemia)   Chronic combined systolic and diastolic heart failure (HCC)   Dehydration   Hyperkalemia   Umbilical hernia without obstruction or gangrene   #1 intractable nausea and vomiting likely secondary to incarcerated umbilical hernia gallbladder dysfunction -Patient presented with intractable nausea vomiting periumbilical pain and right lower quadrant pain worsening over the past 2 to 3 days with inability to keep anything down. -Patient with significant abdominal pain, right lower quadrant on exam, Harding umbilical hernia which is not reducible. -CT abdomen and pelvis done with moderate fat-containing periumbilical hernia is not substantially changed with persistent increased density, favored to reflect marked fat stranding rather than underlying soft tissue lesion. -General surgery following diagnostic laparoscopy and cholecystectomy with umbilical hernia repair today.    2.  Hyponatremia stable at 131 -Likely secondary to hypovolemic hyponatremia in the setting of intractable nausea and vomiting x 2 to 3 days, diuretics of Lasix, spironolactone, Entresto. -Hold antihypertensive medications of spironolactone and Entresto and Lasix. -IV fluids. Labs today   3.  Hyperkalemia resolved -Likely secondary to spironolactone, Entresto. -4.  Hypothyroidism Placed on home regimen Synthroid '1 1 2 '$ mcg IV daily.   5.  A-fib status post PPM -Resume home regimen amiodarone.  Hold Toprol-XL and placed on Lopressor 5 mg IV every 8 hours.  eliquis on hold Heparin gtt  6.  GERD -IV PPI.   7.  Hyperlipidemia -Hold statin.  8.  CKD stage IIIb -Stable.  Pressure Injury 12/18/21 Sacrum Mid Stage 2 -  Partial thickness loss of dermis presenting as a shallow open injury  with a red, pink wound bed without slough. prior moisture associated skin damage now open (Active)  12/18/21 1900   Location: Sacrum  Location Orientation: Mid  Staging: Stage 2 -  Partial thickness loss of dermis presenting as a shallow open injury with a red, pink wound bed without slough.  Wound Description (Comments): prior moisture associated skin damage now open  Present on Admission: No     Pressure Injury 10/28/22 Buttocks Mid Stage 1 -  Intact skin with non-blanchable redness of a localized area usually over a bony prominence. (Active)  10/28/22 0800  Location: Buttocks  Location Orientation: Mid  Staging: Stage 1 -  Intact skin with non-blanchable redness of a localized area usually over a bony prominence.  Wound Description (Comments):   Present on Admission: Yes  Dressing Type Foam - Lift dressing to assess site every shift 10/29/22 2030     Estimated body mass index is 33.09 kg/m as calculated from the following:   Height as of this encounter: '5\' 6"'$  (1.676 m).   Weight as of this encounter: 93 kg.  DVT prophylaxis:      SCDs Code Status:              DNR Family Communication:       Updated patient, [name] at bedside. Disposition Plan:              Patient is from:                        Home             Anticipated DC to:                   Home             Anticipated DC date:               TBD             Anticipated DC barriers:         Clinical improvement            Consults called:        General surgery Admission status:     Admit to progressive care/inpatient Subjective:  Vomited after taking Ensure yesterday No nausea or vomiting after drinking clear liquids today Objective: Vitals:   10/30/22 0500 10/30/22 0840 10/30/22 1107 10/30/22 1124  BP:   (!) 145/56   Pulse:   62   Resp:   15   Temp:   97.7 F (36.5 C)   TempSrc:   Oral   SpO2:  97% 94%   Weight: 93 kg   93 kg  Height:    '5\' 6"'$  (1.676 m)    Intake/Output Summary (Last 24 hours) at 10/30/2022 1333 Last data filed at 10/30/2022 0513 Gross per 24 hour  Intake 240 ml  Output 1450 ml  Net  -1210 ml    Filed Weights   10/29/22 0500 10/30/22 0500 10/30/22 1124  Weight: 89.2 kg 93 kg 93 kg    Examination:  General exam: Appears in nad  Respiratory system: Clear to auscultation. Respiratory effort normal. Cardiovascular system: S1 & S2 heard, RRR. No JVD, murmurs, rubs, gallops or clicks. No pedal edema. Gastrointestinal system: Abdomen is nondistended, soft and umbilical hernia tender. No organomegaly or masses felt. Normal bowel sounds heard. Central nervous system: Alert and oriented. No focal neurological deficits. Extremities: Symmetric 5 x 5 power. Skin:  No rashes, lesions or ulcers Psychiatry: Judgement and insight appear normal. Mood & affect appropriate.   Data Reviewed: I have personally reviewed following labs and imaging studies  CBC: Recent Labs  Lab 10/27/22 1420 10/27/22 1437 10/27/22 1907 10/28/22 0740 10/29/22 0414  WBC 7.4  --  7.3 6.5 5.7  NEUTROABS  --   --  5.0  --  3.7  HGB 12.6 13.6 11.6* 12.6 10.5*  HCT 38.9 40.0 35.9* 39.2 33.2*  MCV 87.4  --  86.9 88.1 90.2  PLT 287  --  255 290 222    Basic Metabolic Panel: Recent Labs  Lab 10/27/22 1420 10/27/22 1437 10/27/22 1907 10/28/22 0740 10/29/22 0414  NA 127* 127* 128* 131* 131*  K 5.3* 5.6* 4.5 4.7 4.6  CL 96* 95* 97* 99 103  CO2 21*  --  20* 22 20*  GLUCOSE 102* 101* 91 88 116*  BUN '13 16 13 13 9  '$ CREATININE 1.35* 1.30* 1.34* 1.39* 1.28*  CALCIUM 8.8*  --  8.5* 9.2 8.1*  MG  --   --  2.0  --  1.7  PHOS  --   --  2.8  --   --     GFR: Estimated Creatinine Clearance: 34.2 mL/min (A) (by C-G formula based on SCr of 1.28 mg/dL (H)). Liver Function Tests: Recent Labs  Lab 10/27/22 1420 10/27/22 1907 10/28/22 0740 10/29/22 0414  AST '16 17 16 '$ 13*  ALT '9 9 9 8  '$ ALKPHOS 74 71 79 56  BILITOT 0.6 0.7 0.6 0.5  PROT 7.0 6.8 7.3 5.4*  ALBUMIN 3.4* 3.1* 3.5 2.6*    Recent Labs  Lab 10/27/22 1420  LIPASE 27    No results for input(s): "AMMONIA" in the last 168  hours. Coagulation Profile: No results for input(s): "INR", "PROTIME" in the last 168 hours. Cardiac Enzymes: No results for input(s): "CKTOTAL", "CKMB", "CKMBINDEX", "TROPONINI" in the last 168 hours. BNP (last 3 results) No results for input(s): "PROBNP" in the last 8760 hours. HbA1C: No results for input(s): "HGBA1C" in the last 72 hours. CBG: Recent Labs  Lab 10/28/22 0742 10/30/22 0750  GLUCAP 89 92    Lipid Profile: No results for input(s): "CHOL", "HDL", "LDLCALC", "TRIG", "CHOLHDL", "LDLDIRECT" in the last 72 hours. Thyroid Function Tests: No results for input(s): "TSH", "T4TOTAL", "FREET4", "T3FREE", "THYROIDAB" in the last 72 hours. Anemia Panel: No results for input(s): "VITAMINB12", "FOLATE", "FERRITIN", "TIBC", "IRON", "RETICCTPCT" in the last 72 hours. Sepsis Labs: Recent Labs  Lab 10/27/22 1420  LATICACIDVEN 1.3     Recent Results (from the past 240 hour(s))  Urine Culture     Status: Abnormal   Collection Time: 10/27/22  8:48 PM   Specimen: Urine, Catheterized  Result Value Ref Range Status   Specimen Description   Final    URINE, CATHETERIZED Performed at [facility] [address], [place], [place] [address]    Special Requests   Final    NONE Performed at [facility], [place] [address] [place], [place] [address]    Culture MULTIPLE SPECIES PRESENT, SUGGEST RECOLLECTION (A)  Final   Report Status 10/29/2022 FINAL  Final         Radiology Studies: NM Hepato W/EF  Result Date: 10/28/2022 CLINICAL DATA:  Abdominal pain with nausea and vomiting EXAM: NUCLEAR MEDICINE HEPATOBILIARY IMAGING WITH GALLBLADDER EF TECHNIQUE: Sequential images of the abdomen were obtained out to 60 minutes following intravenous administration of radiopharmaceutical. After oral ingestion of Ensure, gallbladder  ejection fraction was determined. At 60 min, normal ejection fraction is greater than 33%. RADIOPHARMACEUTICALS:  5.2 mCi  Tc-41m Choletec IV COMPARISON:  CT 10/27/2022 FINDINGS: Prompt uptake and biliary excretion of activity by the liver is seen. Gallbladder activity is visualized, consistent with patency of cystic duct. Biliary activity passes into small bowel, consistent with patent common bile duct. Calculated gallbladder ejection fraction is 29%. (Normal gallbladder ejection fraction with Ensure is greater than 33%.) IMPRESSION: 1. Patency of the cystic duct and common bile duct without scintigraphic evidence of cholecystitis. 2. Gallbladder dysfunction with a decreased ejection fraction of 29%. Electronically Signed   By: NDavina PokeD.O.   On: 10/28/2022 14:28     Scheduled Meds:  [MAR Hold] amiodarone  200 mg Oral Daily   bupivacaine liposome  20 mL Infiltration Once   chlorhexidine  60 mL Topical Once   And   [START ON 10/31/2022] chlorhexidine  60 mL Topical Once   chlorhexidine  15 mL Mouth/Throat Once   Or   mouth rinse  15 mL Mouth Rinse Once   [MAR Hold] feeding supplement  1 Container Oral BID BM   [MAR Hold] heparin injection (subcutaneous)  5,000 Units Subcutaneous Q8H   [MAR Hold] levothyroxine  112 mcg Intravenous Daily   [MAR Hold] metoprolol tartrate  5 mg Intravenous Q8H   [MAR Hold] mometasone-formoterol  2 puff Inhalation BID   [MAR Hold] pantoprazole (PROTONIX) IV  40 mg Intravenous Q24H   Continuous Infusions:   ceFAZolin (ANCEF) IV     dextrose 5 % and 0.9% NaCl 100 mL/hr at 10/30/22 0013   [MAR Hold] lactated ringers     lactated ringers 10 mL/hr at 10/30/22 1243     LOS: 3 days    Time spent: 37 min  EGeorgette Shell MD 10/30/2022, 1:33 PM

## 2022-10-30 NOTE — Op Note (Signed)
Operative Note  [name]  [id number]  [id number]  10/30/2022   Surgeon: Romana Juniper MD FACS   Assistant: Sherlean Foot RNFA   Procedure performed:  Diagnostic laparoscopy Laparoscopic cholecystectomy Primary repair of chronically incarcerated, omentum-containing umbilical hernia (defect approximately 2.5cm)   Preop diagnosis: Subacute abdominal pain, refractory nausea, biliary dyskinesia, incarcerated umbilical hernia Post-op diagnosis/intraop findings: same; dilated and mobile cecum, inflammatory (acute appearing and chronic appearing) adhesions between the sigmoid colon mesentery and LLQ abdominal wall, between small bowel and RLQ abdominal wall, and between small bowel mesenteries   Specimens: gallbladder, inflammatory implant on small bowel mesentery Retained items: no  EBL: minimal cc Complications: none   Description of procedure: After obtaining informed consent the patient was taken to the operating room and placed supine on operating room table where general endotracheal anesthesia was initiated, preoperative antibiotics were administered, SCDs applied, and a formal timeout was performed.  The abdomen was prepped and draped in usual sterile fashion.  Peritoneal access was gained with a left subcostal optical entry and insufflation to 15 mmHg ensued without incident.  The abdomen was surveyed.  There was no injury from our entry.  The chronically incarcerated umbilical hernia was identified and noted to contain incarcerated omentum.  There are send omental adhesions to the low midline which were able to be swept away.  There are dense acute and chronic appearing inflammatory adhesions in the left lower quadrant and right lower quadrant.  Small amount of simple appearing ascites.  Dilated and mobile cecum.  Under direct visualization, additional trocars were placed in the supraumbilical, epigastric and right upper quadrant after infiltration with local.  The patient was  placed in reverse Trendelenburg and rotated to the left.  The gallbladder was dilated with omental adhesions along the body and infundibulum as well as duodenal adhesions along the infundibulum.  The fundus of the gallbladder was retracted cephalad and adhesions were carefully taken down using blunt dissection, and cold scissors to carefully separate the adhesions between the duodenum and the gallbladder infundibulum.  Once these adhesions had been freed away, the infundibulum was retracted laterally.  A combination of cautery and blunt dissection were used to dissect the peritoneum from the cystic duct and lift the gallbladder from the cystic plate.  The cystic artery was diminutive and was divided with cautery.  The critical view of safety was achieved.  The cystic duct was clipped with 3 clips proximally and 1 distally and then divided with the scissors.  The clips were inspected and confirmed to be completely across the remnant cystic duct and well opposed.  The gallbladder was then dissected from the liver bed using cautery and blunt dissection.  The wall of the gallbladder was quite thin and this did tear along the body during dissection spilling inspissated thickened dark bilious fluid.  There were some nodular implants along the mucosa of the gallbladder noted but no overt stones.  Once freed from the liver bed the gallbladder was placed in an Endo Catch bag and removed and handed off for pathology.  The liver bed was inspected and confirmed to be hemostatic.  There was no bile leak from the liver bed nor the cystic duct stump.  The right upper quadrant was irrigated and aspirated, the effluent was clear.  We then turned to the inflammatory adhesions noted in the bilateral lower quadrants.  Some of these were able to be gently bluntly swept down, but adhesions between the sigmoid into the abdominal wall as well as  the terminal ileum and abdominal wall were quite dense and firm consistent with a more chronic  process.  There were also some thin weblike adhesions between the mesentery of small bowel loops.  Small amount of creeping fat along some of the small bowel loops.  No discrete source of this inflammatory process was identified.  We then extended our supraumbilical port site incision over the chronically incarcerated umbilical hernia.  The soft tissue was dissected down to the level of the hernia sac which was then circumferentially isolated down to the level of the fascia.  The hernia sac as well as chronically incarcerated omentum were excised, ligating the remnant omentum with 3-0 Vicryl ties to ensure hemostasis.  The excised hernia sac and omentum were handed off for pathology.  The hernia defect measured about 2.5 cm.  This was extended slightly cephalad and a wound protector placed for further inspection of the inflammatory process.  We are still not really able to identify any specific etiology.  Not able to completely eviscerate the small bowel due to pelvic adhesions.  Again the cecum was noted to be somewhat dilated and mobile.  Some nodular inflammatory implants along the small bowel mesentery were taken off and handed off for pathology as well.  The bowel was all reduced to the abdominal cavity and omentum brought down over it.  The fascia was closed with running #1 PDS starting at either end and tying centrally.  Hemostasis was ensured within the wound.  The umbilical skin was tacked back down to the fascia with a 3-0 Vicryl and then the skin incisions were all closed with subcuticular 4-0 Monocryl followed by benzoin, Steri-Strips and sterile dressings.   The patient was then awakened, extubated and taken to PACU in stable condition.    All counts were correct at the completion of the case.

## 2022-10-30 NOTE — Progress Notes (Signed)
  PERIOPERATIVE PRESCRIPTION FOR IMPLANTED CARDIAC DEVICE PROGRAMMING  Patient Information: Name:  [name]  DOB:  1930/10/30  MRN:  [id number]    Planned Procedure:  umbilical hernia with lap chole  Surgeon:  Dr Kae Heller  Date of Procedure:  10/30/21 ADD ON [facility] FROM Floor  Cautery will be used.  Position during surgery:  supine   Device Information:  Clinic EP Physician:  Allegra Lai, MD   Device Type:  Pacemaker Manufacturer and Phone #:  [employer]: [id number] Pacemaker Dependent?:  Yes.   Date of Last Device Check: In office 06/10/2022/ Remote: 08/31/2022 Normal Device Function?:  Yes.    Electrophysiologist's Recommendations:  Have magnet available. Provide continuous ECG monitoring when magnet is used or reprogramming is to be performed.  Procedure may interfere with device function.  Magnet should be placed over device during procedure.  Per Device Clinic Standing Orders, Diamond Nickel, RN  3:47 PM 10/30/2022

## 2022-10-30 NOTE — Progress Notes (Signed)
Peridex  not given reaction to polythylene per pharmacy not recommended to take.  Caused stomach problems.

## 2022-10-30 NOTE — Anesthesia Procedure Notes (Signed)
Procedure Name: Intubation Date/Time: 10/30/2022 2:37 PM  Performed by: Jenne Campus, CRNAPre-anesthesia Checklist: Patient identified, Emergency Drugs available, Suction available and Patient being monitored Patient Re-evaluated:Patient Re-evaluated prior to induction Oxygen Delivery Method: Circle System Utilized Preoxygenation: Pre-oxygenation with 100% oxygen Induction Type: IV induction Ventilation: Mask ventilation without difficulty Laryngoscope Size: Miller and 3 Grade View: Grade I Tube type: Oral Tube size: 7.0 mm Number of attempts: 1 Airway Equipment and Method: Stylet Placement Confirmation: ETT inserted through vocal cords under direct vision, positive ETCO2 and breath sounds checked- equal and bilateral Secured at: 22 cm Tube secured with: Tape Dental Injury: Teeth and Oropharynx as per pre-operative assessment

## 2022-10-30 NOTE — Anesthesia Preprocedure Evaluation (Addendum)
Anesthesia Evaluation  Patient identified by MRN, date of birth, ID band Patient awake    Reviewed: Allergy & Precautions, H&P , NPO status , Patient's Chart, lab work & pertinent test results  Airway Mallampati: II  TM Distance: >3 FB Neck ROM: Full    Dental no notable dental hx. (+) Teeth Intact, Dental Advisory Given   Pulmonary neg pulmonary ROS   Pulmonary exam normal breath sounds clear to auscultation       Cardiovascular Exercise Tolerance: Good hypertension, +CHF  + dysrhythmias + pacemaker  Rhythm:Regular Rate:Normal     Neuro/Psych negative neurological ROS  negative psych ROS   GI/Hepatic Neg liver ROS,GERD  Medicated,,  Endo/Other  Hypothyroidism    Renal/GU negative Renal ROS  negative genitourinary   Musculoskeletal  (+) Arthritis , Osteoarthritis,    Abdominal   Peds  Hematology  (+) Blood dyscrasia, anemia   Anesthesia Other Findings   Reproductive/Obstetrics negative OB ROS                             Anesthesia Physical Anesthesia Plan  ASA: 3  Anesthesia Plan: General   Post-op Pain Management: Tylenol PO (pre-op)*   Induction: Intravenous  PONV Risk Score and Plan: 4 or greater and Ondansetron, Dexamethasone and Treatment may vary due to age or medical condition  Airway Management Planned: Oral ETT  Additional Equipment:   Intra-op Plan:   Post-operative Plan: Extubation in OR  Informed Consent: I have reviewed the patients History and Physical, chart, labs and discussed the procedure including the risks, benefits and alternatives for the proposed anesthesia with the patient or authorized representative who has indicated his/her understanding and acceptance.     Dental advisory given  Plan Discussed with: CRNA  Anesthesia Plan Comments:        Anesthesia Quick Evaluation

## 2022-10-30 NOTE — Anesthesia Postprocedure Evaluation (Signed)
Anesthesia Post Note  Patient: Tya S Philipp  Procedure(s) Performed: UMBILICAL HERNIA REPAIR (Abdomen) LAPAROSCOPIC CHOLECYSTECTOMY (Abdomen)     Patient location during evaluation: PACU Anesthesia Type: General Level of consciousness: awake and alert Pain management: pain level controlled Vital Signs Assessment: post-procedure vital signs reviewed and stable Respiratory status: spontaneous breathing, nonlabored ventilation, respiratory function stable and patient connected to nasal cannula oxygen Cardiovascular status: blood pressure returned to baseline and stable Postop Assessment: no apparent nausea or vomiting Anesthetic complications: no  No notable events documented.  Last Vitals:  Vitals:   10/30/22 1645 10/30/22 1700  BP: (!) 130/53 (!) 135/47  Pulse: (!) 59 (!) 59  Resp: (!) 23 (!) 25  Temp:  36.7 C  SpO2: 96% 100%    Last Pain:  Vitals:   10/30/22 1124  TempSrc:   PainSc: 0-No pain                 Merideth Bosque S

## 2022-10-30 NOTE — Transfer of Care (Signed)
Immediate Anesthesia Transfer of Care Note  Patient: [name]  Procedure(s) Performed: UMBILICAL HERNIA REPAIR (Abdomen) LAPAROSCOPIC CHOLECYSTECTOMY (Abdomen)  Patient Location: PACU  Anesthesia Type:General  Level of Consciousness: oriented, drowsy, and patient cooperative  Airway & Oxygen Therapy: Patient Spontanous Breathing and Patient connected to face mask oxygen  Post-op Assessment: Report given to RN and Post -op Vital signs reviewed and stable  Post vital signs: Reviewed  Last Vitals:  Vitals Value Taken Time  BP 141/51 10/30/22 1635  Temp    Pulse 58 10/30/22 1638  Resp 25 10/30/22 1638  SpO2 96 % 10/30/22 1638  Vitals shown include unvalidated device data.  Last Pain:  Vitals:   10/30/22 1124  TempSrc:   PainSc: 0-No pain      Patients Stated Pain Goal: 1 (91/91/66 0600)  Complications: No notable events documented.

## 2022-10-30 NOTE — Interval H&P Note (Signed)
History and Physical Interval Note:  10/30/2022 1:03 PM  [name]  has presented today for surgery, with the diagnosis of INCARCERATED UMBILICAL HERNIA.  The various methods of treatment have been discussed with the patient and family. After consideration of risks, benefits and other options for treatment, the patient has consented to  Procedure(s): UMBILICAL HERNIA REPAIR (N/A) LAPAROSCOPIC CHOLECYSTECTOMY (N/A) as a surgical intervention.  The patient's history has been reviewed, patient examined, no change in status, stable for surgery.  I have reviewed the patient's chart and labs.  Questions were answered to the patient's satisfaction.     [name]

## 2022-10-31 ENCOUNTER — Encounter (HOSPITAL_COMMUNITY): Payer: Self-pay | Admitting: Surgery

## 2022-10-31 DIAGNOSIS — R112 Nausea with vomiting, unspecified: Secondary | ICD-10-CM | POA: Diagnosis not present

## 2022-10-31 LAB — COMPREHENSIVE METABOLIC PANEL
ALT: 11 U/L (ref 0–44)
AST: 24 U/L (ref 15–41)
Albumin: 2.6 g/dL — ABNORMAL LOW (ref 3.5–5.0)
Alkaline Phosphatase: 70 U/L (ref 38–126)
Anion gap: 10 (ref 5–15)
BUN: 9 mg/dL (ref 8–23)
CO2: 17 mmol/L — ABNORMAL LOW (ref 22–32)
Calcium: 8.3 mg/dL — ABNORMAL LOW (ref 8.9–10.3)
Chloride: 107 mmol/L (ref 98–111)
Creatinine, Ser: 1.15 mg/dL — ABNORMAL HIGH (ref 0.44–1.00)
GFR, Estimated: 46 mL/min — ABNORMAL LOW (ref >60)
Glucose, Bld: 172 mg/dL — ABNORMAL HIGH (ref 70–99)
Potassium: 4.7 mmol/L (ref 3.5–5.1)
Sodium: 134 mmol/L — ABNORMAL LOW (ref 135–145)
Total Bilirubin: 0.4 mg/dL (ref 0.3–1.2)
Total Protein: 5.6 g/dL — ABNORMAL LOW (ref 6.5–8.1)

## 2022-10-31 LAB — CBC
HCT: 35.7 % — ABNORMAL LOW (ref 36.0–46.0)
Hemoglobin: 11 g/dL — ABNORMAL LOW (ref 12.0–15.0)
MCH: 28.1 pg (ref 26.0–34.0)
MCHC: 30.8 g/dL (ref 30.0–36.0)
MCV: 91.1 fL (ref 80.0–100.0)
Platelets: 247 10*3/uL (ref 150–400)
RBC: 3.92 MIL/uL (ref 3.87–5.11)
RDW: 13.4 % (ref 11.5–15.5)
WBC: 11.5 10*3/uL — ABNORMAL HIGH (ref 4.0–10.5)
nRBC: 0 % (ref 0.0–0.2)

## 2022-10-31 LAB — GLUCOSE, CAPILLARY: Glucose-Capillary: 174 mg/dL — ABNORMAL HIGH (ref 70–99)

## 2022-10-31 LAB — HEPARIN LEVEL (UNFRACTIONATED): Heparin Unfractionated: >1.1 IU/mL — ABNORMAL HIGH (ref 0.30–0.70)

## 2022-10-31 LAB — APTT: aPTT: 117 seconds — ABNORMAL HIGH (ref 24–36)

## 2022-10-31 MED ORDER — DOCUSATE SODIUM 100 MG PO CAPS
100.0000 mg | ORAL_CAPSULE | Freq: Two times a day (BID) | ORAL | Status: DC
  Administered 2022-10-31 – 2022-11-03 (×7): 100 mg via ORAL
  Filled 2022-10-31 (×7): qty 1

## 2022-10-31 MED ORDER — HEPARIN (PORCINE) 25000 UT/250ML-% IV SOLN
900.0000 [IU]/h | INTRAVENOUS | Status: DC
  Administered 2022-10-31: 1100 [IU]/h via INTRAVENOUS
  Filled 2022-10-31: qty 250

## 2022-10-31 MED ORDER — METHOCARBAMOL 1000 MG/10ML IJ SOLN: 500.0000 mg | Freq: Four times a day (QID) | INTRAVENOUS | Status: DC | PRN

## 2022-10-31 MED ORDER — ACETAMINOPHEN 500 MG PO TABS
1000.0000 mg | ORAL_TABLET | Freq: Four times a day (QID) | ORAL | Status: DC
  Administered 2022-10-31 – 2022-11-03 (×12): 1000 mg via ORAL
  Filled 2022-10-31 (×13): qty 2

## 2022-10-31 MED ORDER — PANTOPRAZOLE SODIUM 40 MG PO TBEC
40.0000 mg | DELAYED_RELEASE_TABLET | Freq: Every day | ORAL | Status: DC
  Administered 2022-10-31 – 2022-11-03 (×4): 40 mg via ORAL
  Filled 2022-10-31 (×4): qty 1

## 2022-10-31 MED ORDER — OXYCODONE HCL 5 MG PO TABS
5.0000 mg | ORAL_TABLET | ORAL | Status: DC | PRN
  Administered 2022-10-31: 5 mg via ORAL
  Filled 2022-10-31: qty 1

## 2022-10-31 MED ORDER — FAMOTIDINE 20 MG PO TABS
20.0000 mg | ORAL_TABLET | Freq: Every day | ORAL | Status: DC
  Administered 2022-10-31 – 2022-11-02 (×3): 20 mg via ORAL
  Filled 2022-10-31 (×3): qty 1

## 2022-10-31 MED ORDER — BISACODYL 10 MG RE SUPP: 10.0000 mg | Freq: Every day | RECTAL | Status: DC | PRN

## 2022-10-31 NOTE — Progress Notes (Signed)
ANTICOAGULATION CONSULT NOTE - Initial Consult  Pharmacy Consult for heparin IV Indication: atrial fibrillation  Allergies  Allergen Reactions   Atropine Other (See Comments)    Made her entire body jump after they gave it to her.   Miralax [Polyethylene Glycol] Other (See Comments)    17g dose caused stomach troubles for 3 days    Prozac [Fluoxetine] Other (See Comments)    Hyponatremia    Serotonin Reuptake Inhibitors (Ssris) Other (See Comments)    Hyponatremia    Keflex [Cephalexin] Other (See Comments)    Unknown reaction Tolerated Cefdinir 05/2022    Patient Measurements: Height: '5\' 6"'$  (167.6 cm) Weight: 95 kg (209 lb 7 oz) IBW/kg (Calculated) : 59.3 Heparin Dosing Weight: 80 kg  Vital Signs: Temp: 97.8 F (36.6 C) (01/26 0453) Temp Source: Oral (01/26 0453) BP: 124/61 (01/26 0453) Pulse Rate: 60 (01/26 0453)  Labs: Recent Labs    10/29/22 0414 10/30/22 0837 10/30/22 1700  HGB 10.5*  --  12.1  HCT 33.2*  --  39.5  PLT 241  --  261  APTT  --  47*  --   HEPARINUNFRC  --  >1.10*  --   CREATININE 1.28*  --  1.01*    Estimated Creatinine Clearance: 43.9 mL/min (A) (by C-G formula based on SCr of 1.01 mg/dL (H)).   Medical History: Past Medical History:  Diagnosis Date   Chronic combined systolic and diastolic heart failure ([place]) 08/26/2022   Dyspnea    diastolic dysfunction by echo 2012   GERD (gastroesophageal reflux disease)    Hypertension    Mild mitral and aortic regurgitation    Osteoarthritis    Presence of permanent cardiac pacemaker    Right bundle branch block 10/06/2012   Thyroid disease     Medications:  Medications Prior to Admission  Medication Sig Dispense Refill Last Dose   acetaminophen (TYLENOL) 500 MG tablet Take 1,000 mg by mouth every 6 (six) hours as needed for mild pain.   Past Week   amiodarone (PACERONE) 200 MG tablet Take 1 tablet (200 mg total) by mouth daily. 90 tablet 2 10/30/2022   apixaban (ELIQUIS) 5 MG TABS tablet  Take 1 tablet (5 mg total) by mouth 2 (two) times daily. 180 tablet 1 10/27/2022 at 0900   benzonatate (TESSALON PERLES) 100 MG capsule Take 1 capsule (100 mg total) by mouth 3 (three) times daily as needed for cough. 20 capsule 0 Past Week   carboxymethylcellulose (REFRESH PLUS) 0.5 % SOLN Place 1 drop into both eyes daily as needed (dry eyes).   Past Month   Cholecalciferol (VITAMIN D-3 PO) Take 1 capsule by mouth daily with lunch.   Past Week   Cyanocobalamin (VITAMIN B-12 PO) Take 1 tablet by mouth daily with lunch.   Past Week   Dextromethorphan HBr (DELSYM PO) Take 10 mLs by mouth 2 (two) times daily as needed (cough).   Past Week   famotidine (PEPCID) 20 MG tablet Take 1 tablet (20 mg total) by mouth at bedtime. 30 tablet 2 Past Week   fluticasone (FLONASE) 50 MCG/ACT nasal spray Place 1 spray into both nostrils daily.   10/27/2022   fluticasone-salmeterol (WIXELA INHUB) 100-50 MCG/ACT AEPB Inhale 1 puff into the lungs 2 (two) times daily. 1 each 5 Past Week   furosemide (LASIX) 20 MG tablet Take '40mg'$  (2 tablets) daily. May take additional '20mg'$  (1 tablet) as needed for weight gain of 2 pounds overnight or 5 pounds in one week (Patient taking  differently: Take 20 mg by mouth daily.) 90 tablet 3 10/27/2022   HYDROcodone-acetaminophen (NORCO) 5-325 MG tablet Take 1 tablet by mouth every 6 (six) hours as needed for moderate pain. (Patient taking differently: Take 0.5 tablets by mouth 2 (two) times daily as needed for moderate pain.) 20 tablet 0 Past Week   ipratropium (ATROVENT) 0.06 % nasal spray Place 1 spray into both nostrils 2 (two) times daily.   10/27/2022   ketoconazole (NIZORAL) 2 % cream Apply 1 Application topically 2 (two) times daily as needed for rash.   Past Month   levothyroxine (SYNTHROID) 200 MCG tablet Take 200 mcg by mouth daily.   10/27/2022   levothyroxine (SYNTHROID) 25 MCG tablet Take 25 mcg by mouth daily.   10/27/2022   LORazepam (ATIVAN) 0.5 MG tablet Take 0.5 mg by mouth at  bedtime.   Past Week   lovastatin (MEVACOR) 40 MG tablet Take 40 mg by mouth daily with supper.   Past Week   metoprolol succinate (TOPROL-XL) 25 MG 24 hr tablet Take 0.5 tablets (12.5 mg total) by mouth daily. 45 tablet 3 10/27/2022 at 0900   Multiple Vitamins-Minerals (EYE VITAMINS) CAPS Take 1 capsule by mouth daily with lunch.   Past Week   omeprazole (PRILOSEC) 10 MG capsule Take 10 mg by mouth every morning.   10/27/2022   ondansetron (ZOFRAN) 4 MG tablet Take 1 tablet (4 mg total) by mouth every 6 (six) hours. (Patient taking differently: Take 4 mg by mouth every 6 (six) hours as needed for nausea or vomiting.) 12 tablet 0 Past Week   potassium chloride SA (KLOR-CON M) 20 MEQ tablet Take 1 tablet (20 mEq total) by mouth daily. 90 tablet 3 10/27/2022   sacubitril-valsartan (ENTRESTO) 24-26 MG Take 1 tablet by mouth 2 (two) times daily. 180 tablet 3 10/27/2022   spironolactone (ALDACTONE) 25 MG tablet Take 0.5 tablets (12.5 mg total) by mouth daily. 45 tablet 3 10/27/2022   Thiamine HCl (THIAMINE PO) Take 1 tablet by mouth daily with lunch. Vitamin B-1, unknown strength.   Past Week   triamcinolone cream (KENALOG) 0.1 % Apply 1 Application topically 2 (two) times daily as needed (flare ups).   Past Month   levothyroxine (SYNTHROID) 75 MCG tablet Take 3 tablets (225 mcg total) by mouth daily at 6 (six) AM. (Patient not taking: Reported on 10/28/2022) 90 tablet 0 Not Taking   polyethylene glycol (MIRALAX) 17 g packet Take 17 g by mouth daily. (Patient not taking: Reported on 10/28/2022) 14 each 0 Not Taking   Scheduled:   acetaminophen  1,000 mg Oral Q6H   amiodarone  200 mg Oral Daily   docusate sodium  100 mg Oral BID   famotidine  20 mg Oral QHS   feeding supplement  1 Container Oral BID BM   [START ON 11/03/2022] levothyroxine  112 mcg Intravenous Daily   metoprolol tartrate  5 mg Intravenous Q8H   mometasone-formoterol  2 puff Inhalation BID   pantoprazole  40 mg Oral Daily   PRN: albuterol,  bisacodyl, hydrALAZINE, HYDROmorphone (DILAUDID) injection, ketoconazole, LORazepam, methocarbamol (ROBAXIN) IV, ondansetron **OR** ondansetron (ZOFRAN) IV, oxyCODONE, polyethylene glycol, sorbitol  Assessment: 8 yoF with PMH Afib s/p PPM, on Eliquis PTA, combined CHF, HTN, CKD3, admitted for repair of incarcerated umbilical hernia. Afib held for procedure; now POD1 and per Surgery team OK to start heparin infusion. Anticipating return to [facility] in 24-48 hr if stable  Baseline aPTT slightly elevated (recent SQH); heparin level elevated likely d/t recent  SQH as well as residual Eliquis. Prior anticoagulation: Eliquis 5 mg PO bid; last dose 1/22 in AM  Significant events: - 1/25: Dx lap w/ lap cholecystectomy, repair of incarcerated umbilical hernia  Today, 10/31/2022: CBC: improved to WNL postop SCr actually below baseline (~1.2) postop, likely from hydration No bleeding or infusion issues per nursing  Goal of Therapy: Heparin level 0.3-0.7 units/ml Monitor platelets by anticoagulation protocol: Yes  Plan: Start heparin 1100 units/hr IV infusion; no bolus needed given indication and recent surgery Check heparin level 8 hrs after start Daily CBC, daily heparin level once stable Monitor for signs of bleeding or thrombosis  Reuel Boom, PharmD, BCPS [phone] 10/31/2022, 12:16 PM

## 2022-10-31 NOTE — Evaluation (Signed)
Occupational Therapy Evaluation Patient Details Name: [name] MRN: [id number] DOB: 1931/09/01 Today's Date: 10/31/2022   History of Present Illness Pt is an 89 y/o F admitted on 10/27/22. Pt was scheduled to have hernia repair on 10/31/22 but when she presented for pre-op testing pt was found to have intractable N&V, worsening pain with some associated SOB, diarrhea, lightheadedness, & R flank pain so pt advised to go to ED. Pt has been admitted for medical management with plans for general sx to consult. PMH: chronic combined systolic & diastolic CHF, HTN, CKD 3, a-fib s/p PPM, GERD, incarcerated umbilical hernia, R BBB   Clinical Impression   [name] is an 89 year old woman who is typically independent with mobility and ADLs with rollator. Today she presents with pain and decreased activity tolerance resulting in needing increased assistance for LB ADLs and toileting and limiting ambulation. She reports 8/10 in RLQ with sitting and standing. She was able to stand and brush her teeth at the sink. Patient will benefit from skilled OT services while in hospital to improve deficits and learn compensatory strategies as needed in order to return to PLOF.       Recommendations for follow up therapy are one component of a multi-disciplinary discharge planning process, led by the attending physician.  Recommendations may be updated based on patient status, additional functional criteria and insurance authorization.   Follow Up Recommendations  Home health OT     Assistance Recommended at Discharge Intermittent Supervision/Assistance  Patient can return home with the following A little help with bathing/dressing/bathroom;Assistance with cooking/housework;Help with stairs or ramp for entrance    Functional Status Assessment  Patient has had a recent decline in their functional status and demonstrates the ability to make significant improvements in function in a reasonable and  predictable amount of time.  Equipment Recommendations  None recommended by OT    Recommendations for Other Services       Precautions / Restrictions Precautions Precautions: Fall Restrictions Weight Bearing Restrictions: No      Mobility Bed Mobility Overal bed mobility: Needs Assistance Bed Mobility: Supine to Sit     Supine to sit: Supervision, HOB elevated          Transfers Overall transfer level: Needs assistance Equipment used: Rolling walker (2 wheels) Transfers: Sit to/from Stand Sit to Stand: From elevated surface, Min assist                  Balance Overall balance assessment: Mild deficits observed, not formally tested                                         ADL either performed or assessed with clinical judgement   ADL Overall ADL's : Needs assistance/impaired Eating/Feeding: Independent   Grooming: Standing;Oral care   Upper Body Bathing: Set up;Sitting   Lower Body Bathing: Moderate assistance;Sit to/from stand   Upper Body Dressing : Sitting;Set up   Lower Body Dressing: Moderate assistance;Sit to/from stand   Toilet Transfer: Min guard;Rolling walker (2 wheels);BSC/3in1   Toileting- Water quality scientist and Hygiene: Moderate assistance;Sit to/from stand       Functional mobility during ADLs: Min guard;Rolling walker (2 wheels)       Vision Patient Visual Report: No change from baseline       Perception     Praxis      Pertinent Vitals/Pain Pain  Assessment Pain Assessment: 0-10 Pain Score: 8  Pain Location: abdomen Pain Descriptors / Indicators: Grimacing, Discomfort, Guarding Pain Intervention(s): Limited activity within patient's tolerance, Patient requesting pain meds-RN notified     Hand Dominance Right   Extremity/Trunk Assessment Upper Extremity Assessment Upper Extremity Assessment: Overall WFL for tasks assessed   Lower Extremity Assessment Lower Extremity Assessment: Defer to PT  evaluation   Cervical / Trunk Assessment Cervical / Trunk Assessment: Kyphotic   Communication Communication Communication: HOH   Cognition Arousal/Alertness: Awake/alert Behavior During Therapy: WFL for tasks assessed/performed Overall Cognitive Status: Within Functional Limits for tasks assessed                                       General Comments       Exercises     Shoulder Instructions      Home Living Family/patient expects to be discharged to:: Private residence Living Arrangements: Children Available Help at Discharge: Family;Available 24 hours/day Type of Home: House Home Access: Stairs to enter [employer] of Steps: 1 Entrance Stairs-Rails: None Home Layout: One level     Bathroom Shower/Tub: Chief Strategy Officer: Rollator (4 wheels);BSC/3in1;Grab bars - tub/shower;Shower seat   Additional Comments: daughter currently living with patient, providing care day & night, pt's Husband died 3 months ago      Prior Functioning/Environment Prior Level of Function : Needs assist             Mobility Comments: Pt is mobile with rollator without assistance from daughter. ADLs Comments: Daughter assisting with med management, finances, cooking, cleaning        OT Problem List: Decreased activity tolerance;Pain;Obesity      OT Treatment/Interventions: Self-care/ADL training;Patient/family education;Therapeutic activities    OT Goals(Current goals can be found in the care plan section) Acute Rehab OT Goals Patient Stated Goal: less pain with movement OT Goal Formulation: With patient Time For Goal Achievement: 11/14/22 Potential to Achieve Goals: Good  OT Frequency: Min 2X/week    Co-evaluation              AM-PAC OT "6 Clicks" Daily Activity     Outcome Measure Help from another person eating meals?: None Help from another person taking care of personal grooming?: A Little Help from another person  toileting, which includes using toliet, bedpan, or urinal?: A Lot Help from another person bathing (including washing, rinsing, drying)?: A Lot Help from another person to put on and taking off regular upper body clothing?: A Little Help from another person to put on and taking off regular lower body clothing?: A Lot 6 Click Score: 16   End of Session Equipment Utilized During Treatment: Rolling walker (2 wheels) Nurse Communication: Mobility status  Activity Tolerance: Patient tolerated treatment well Patient left: in chair;with call bell/phone within reach;with chair alarm set;with family/visitor present  OT Visit Diagnosis: Pain                Time: 1030-1043 OT Time Calculation (min): 13 min Charges:  OT General Charges $OT Visit: 1 Visit OT Evaluation $OT Eval Low Complexity: 1 Low  Gustavo Lah, OTR/L [place]  Office [phone]   Lenward Chancellor 10/31/2022, 1:07 PM

## 2022-10-31 NOTE — Progress Notes (Signed)
PROGRESS NOTE    [name]  DJM:[id number] DOB: 03/12/1931 DOA: 10/27/2022 PCP: Merrilee Seashore, MD    Brief Narrative:  [name] is a 89 y.o. female with medical history significant of chronic combined syst and diastolic CHF, HTN, CKD stage III, Afib s/p PPM, GERD, hx of incarcerated umbilical hernia who was to have repair of hernia by general surgery, Dr. Kae Heller on Friday, 10/31/2022 who had presented to preop testing and noted to have had intractable nausea vomiting and worsening periumbilical and right lower quadrant abdominal pain over the past 2 to 3 days with some associated shortness of breath, diarrhea, lightheadedness, right flank pain in addition.  Patient denies any fevers, no chills, no chest pain, no constipation, no melena, no hematemesis, no hematochezia.  Patient seen at preop testing, call was made to general surgery's office and patient advised to present to the ED.  Patient noted to have been seen in the ED on 10/07/2022 with similar symptoms.   ED Course: Patient seen in the ED, comprehensive metabolic profile obtained with a sodium of 127, potassium of 5.3, chloride of 96, glucose of 102, creatinine of 1.35, albumin of 3.4 otherwise within normal limits.  Lactic acid level noted at 1.3.  CBC unremarkable.  Repeat CT abdomen and pelvis done with moderate fat-containing periumbilical hernia is not substantially changed with persistent increased density, favored to reflect marked fat stranding rather than underlying soft tissue lesion.  Slightly increased small volume free fluid and mesenteric stranding nonspecific may be reactive, small hiatal hernia, colonic diverticulosis without acute diverticulitis, aortic atherosclerosis.  ED PA spoke with general surgery who had recommended medical admission and general surgery will consult in the morning.  Assessment & Plan:   Principal Problem:   Intractable nausea and vomiting Active Problems:   Incarcerated hernia    Atrial fibrillation with RVR (H[facility])   Hyponatremia   Essential hypertension   Hypothyroidism   HLD (hyperlipidemia)   Chronic combined systolic and diastolic heart failure ([facility])   Dehydration   Hyperkalemia   Umbilical hernia without obstruction or gangrene   #1 intractable nausea and vomiting likely secondary to incarcerated umbilical hernia gallbladder dysfunction -Patient presented with intractable nausea vomiting periumbilical pain and right lower quadrant pain worsening over the past 2 to 3 days with inability to keep anything down. -Patient with significant abdominal pain, right lower quadrant on exam, [place] umbilical hernia which is not reducible. -CT abdomen and pelvis done with moderate fat-containing periumbilical hernia is not substantially changed with persistent increased density, favored to reflect marked fat stranding rather than underlying soft tissue lesion. -General surgery following s/p diagnostic laparoscopy and cholecystectomy with umbilical hernia repair.    2.  Hyponatremia stable at 132 -Likely secondary to hypovolemic hyponatremia in the setting of intractable nausea and vomiting x 2 to 3 days, diuretics of Lasix, spironolactone, Entresto. -Hold antihypertensive medications of spironolactone and Entresto and Lasix. -IV fluids. Labs today pending    3.  Hyperkalemia resolved -Likely secondary to spironolactone, Entresto. -4.  Hypothyroidism Placed on home regimen Synthroid '1 1 2 '$ mcg IV daily.   5.  A-fib status post PPM -Resume home regimen amiodarone.  Hold Toprol-XL and placed on Lopressor 5 mg IV every 8 hours.  eliquis on hold Heparin gtt  6.  GERD -IV PPI.   7.  Hyperlipidemia -Hold statin.  8.  CKD stage IIIb -Stable.  Pressure Injury 12/18/21 Sacrum Mid Stage 2 -  Partial thickness loss of dermis presenting as a shallow  open injury with a red, pink wound bed without slough. prior moisture associated skin damage now open (Active)  12/18/21 1900   Location: Sacrum  Location Orientation: Mid  Staging: Stage 2 -  Partial thickness loss of dermis presenting as a shallow open injury with a red, pink wound bed without slough.  Wound Description (Comments): prior moisture associated skin damage now open  Present on Admission: No     Pressure Injury 10/28/22 Buttocks Mid Stage 1 -  Intact skin with non-blanchable redness of a localized area usually over a bony prominence. (Active)  10/28/22 0800  Location: Buttocks  Location Orientation: Mid  Staging: Stage 1 -  Intact skin with non-blanchable redness of a localized area usually over a bony prominence.  Wound Description (Comments):   Present on Admission: Yes  Dressing Type Foam - Lift dressing to assess site every shift 10/30/22 2200     Estimated body mass index is 33.8 kg/m as calculated from the following:   Height as of this encounter: '5\' 6"'$  (1.676 m).   Weight as of this encounter: 95 kg.  DVT prophylaxis:      SCDs Code Status:              DNR Family Communication:       Updated patient, daughter [name] at bedside. Disposition Plan:              Patient is from:                        Home             Anticipated DC to:                   Home             Anticipated DC date:               TBD             Anticipated DC barriers:         Clinical improvement            Consults called:        General surgery Admission status:     Admit to progressive care/inpatient Subjective: Reports flatus last night Objective: Vitals:   10/30/22 2228 10/31/22 0453 10/31/22 0500 10/31/22 0855  BP: (!) 137/55 124/61    Pulse: 60 60    Resp:  20    Temp: 97.7 F (36.5 C) 97.8 F (36.6 C)    TempSrc: Oral Oral    SpO2: 94% 98%  97%  Weight:   95 kg   Height:        Intake/Output Summary (Last 24 hours) at 10/31/2022 1144 Last data filed at 10/31/2022 0735 Gross per 24 hour  Intake 1100 ml  Output 420 ml  Net 680 ml    Filed Weights   10/30/22 0500 10/30/22 1124  10/31/22 0500  Weight: 93 kg 93 kg 95 kg    Examination:  General exam: Appears in nad  Respiratory system: Clear to auscultation. Respiratory effort normal. Cardiovascular system: S1 & S2 heard, RRR. No JVD, murmurs, rubs, gallops or clicks. No pedal edema. Gastrointestinal system: Abdomen is nondistended, soft and umbilical hernia tender. No organomegaly or masses felt. Normal bowel sounds heard. Central nervous system: Alert and oriented. No focal neurological deficits. Extremities: Symmetric 5 x 5 power. Skin: No rashes, lesions or ulcers Psychiatry: Judgement and insight appear  normal. Mood & affect appropriate.   Data Reviewed: I have personally reviewed following labs and imaging studies  CBC: Recent Labs  Lab 10/27/22 1420 10/27/22 1437 10/27/22 1907 10/28/22 0740 10/29/22 0414 10/30/22 1700  WBC 7.4  --  7.3 6.5 5.7 8.5  NEUTROABS  --   --  5.0  --  3.7  --   HGB 12.6 13.6 11.6* 12.6 10.5* 12.1  HCT 38.9 40.0 35.9* 39.2 33.2* 39.5  MCV 87.4  --  86.9 88.1 90.2 92.5  PLT 287  --  255 290 241 242    Basic Metabolic Panel: Recent Labs  Lab 10/27/22 1420 10/27/22 1437 10/27/22 1907 10/28/22 0740 10/29/22 0414 10/30/22 1700  NA 127* 127* 128* 131* 131* 132*  K 5.3* 5.6* 4.5 4.7 4.6 3.8  CL 96* 95* 97* 99 103 109  CO2 21*  --  20* 22 20* 16*  GLUCOSE 102* 101* 91 88 116* 138*  BUN '13 16 13 13 9 '$ 6*  CREATININE 1.35* 1.30* 1.34* 1.39* 1.28* 1.01*  CALCIUM 8.8*  --  8.5* 9.2 8.1* 8.4*  MG  --   --  2.0  --  1.7  --   PHOS  --   --  2.8  --   --   --     GFR: Estimated Creatinine Clearance: 43.9 mL/min (A) (by C-G formula based on SCr of 1.01 mg/dL (H)). Liver Function Tests: Recent Labs  Lab 10/27/22 1420 10/27/22 1907 10/28/22 0740 10/29/22 0414 10/30/22 1700  AST '16 17 16 '$ 13* 17  ALT '9 9 9 8 10  '$ ALKPHOS 74 71 79 56 78  BILITOT 0.6 0.7 0.6 0.5 0.3  PROT 7.0 6.8 7.3 5.4* 6.3*  ALBUMIN 3.4* 3.1* 3.5 2.6* 3.2*    Recent Labs  Lab 10/27/22 1420   LIPASE 27    No results for input(s): "AMMONIA" in the last 168 hours. Coagulation Profile: No results for input(s): "INR", "PROTIME" in the last 168 hours. Cardiac Enzymes: No results for input(s): "CKTOTAL", "CKMB", "CKMBINDEX", "TROPONINI" in the last 168 hours. BNP (last 3 results) No results for input(s): "PROBNP" in the last 8760 hours. HbA1C: No results for input(s): "HGBA1C" in the last 72 hours. CBG: Recent Labs  Lab 10/28/22 0742 10/30/22 0750 10/31/22 0733  GLUCAP 89 92 174*    Lipid Profile: No results for input(s): "CHOL", "HDL", "LDLCALC", "TRIG", "CHOLHDL", "LDLDIRECT" in the last 72 hours. Thyroid Function Tests: No results for input(s): "TSH", "T4TOTAL", "FREET4", "T3FREE", "THYROIDAB" in the last 72 hours. Anemia Panel: No results for input(s): "VITAMINB12", "FOLATE", "FERRITIN", "TIBC", "IRON", "RETICCTPCT" in the last 72 hours. Sepsis Labs: Recent Labs  Lab 10/27/22 1420  LATICACIDVEN 1.3     Recent Results (from the past 240 hour(s))  Urine Culture     Status: Abnormal   Collection Time: 10/27/22  8:48 PM   Specimen: Urine, Catheterized  Result Value Ref Range Status   Specimen Description   Final    URINE, CATHETERIZED Performed at [facility] [address] [place], [place] [address]    Special Requests   Final    NONE Performed at [facility], [place] [address], [place], [place] [address]    Culture MULTIPLE SPECIES PRESENT, SUGGEST RECOLLECTION (A)  Final   Report Status 10/29/2022 FINAL  Final         Radiology Studies: No results found.   Scheduled Meds:  acetaminophen  1,000 mg Oral Q6H   amiodarone  200 mg  Oral Daily   docusate sodium  100 mg Oral BID   famotidine  20 mg Oral QHS   feeding supplement  1 Container Oral BID BM   heparin injection (subcutaneous)  5,000 Units Subcutaneous Q8H   [START ON 11/03/2022] levothyroxine  112 mcg Intravenous Daily   metoprolol tartrate  5 mg  Intravenous Q8H   mometasone-formoterol  2 puff Inhalation BID   pantoprazole  40 mg Oral Daily   Continuous Infusions:  dextrose 5 % and 0.9% NaCl 100 mL/hr at 10/31/22 0451   methocarbamol (ROBAXIN) IV       LOS: 4 days    Time spent: 37 min  Georgette Shell, MD 10/31/2022, 11:44 AM

## 2022-10-31 NOTE — Care Management Important Message (Signed)
Important Message  Patient Details IM Letter given. Name: [name] MRN: [id number] Date of Birth: [birth year]   Medicare Important Message Given:  Yes     Kerin Salen 10/31/2022, 12:52 PM

## 2022-10-31 NOTE — Progress Notes (Signed)
Physical Therapy Treatment Patient Details Name: [name] MRN: [id number] DOB: 01/29/1931 Today's Date: 10/31/2022   History of Present Illness Pt is an 89 y/o F admitted on 10/27/22. Pt was scheduled to have hernia repair on 10/31/22 but when she presented for pre-op testing pt was found to have intractable N&V, worsening pain with some associated SOB, diarrhea, lightheadedness, & R flank pain so pt advised to go to ED. Pt has been admitted for medical management with plans for general sx to consult. PMH: chronic combined systolic & diastolic CHF, HTN, CKD 3, a-fib s/p PPM, GERD, incarcerated umbilical hernia, R BBB    PT Comments    General Comments: AxO x 3 very pleasant Lady, Highly motivated.  OOB in recliner.  Daughter in room.  General transfer comment: forward momentum and increased ABD pain with activity.  25% VC's on hand placement as well as safety with turns using walker. General Gait Details: Pt uses a Rollator at home.  Tolerated amb 32 feet in hallway with increased time and 25% VC's on proper walker to self distance.  Slightly forward posture due to recent ABD surgery. Pt plans to return home with Daughter.  Pt's Spouse recently passed.    Recommendations for follow up therapy are one component of a multi-disciplinary discharge planning process, led by the attending physician.  Recommendations may be updated based on patient status, additional functional criteria and insurance authorization.  Follow Up Recommendations  Home health PT     Assistance Recommended at Discharge Frequent or constant Supervision/Assistance  Patient can return home with the following A little help with bathing/dressing/bathroom;A little help with walking and/or transfers;Assistance with cooking/housework;Assist for transportation;Help with stairs or ramp for entrance   Equipment Recommendations  None recommended by PT    Recommendations for Other Services       Precautions / Restrictions  Precautions Precautions: Fall Precaution Comments: recent ABD pain Restrictions Weight Bearing Restrictions: No     Mobility  Bed Mobility               General bed mobility comments: OOB in recliner    Transfers Overall transfer level: Needs assistance Equipment used: Rolling walker (2 wheels) Transfers: Sit to/from Stand Sit to Stand: From elevated surface, Min assist           General transfer comment: forward momentum and increased ABD pain with activity.  25% VC's on hand placement as well as safety with turns using walker.    Ambulation/Gait Ambulation/Gait assistance: Min guard, Min assist Gait Distance (Feet): 32 Feet Assistive device: Rolling walker (2 wheels) Gait Pattern/deviations: Step-to pattern, Decreased step length - right, Decreased step length - left, Trunk flexed Gait velocity: decreased     General Gait Details: Pt uses a Rollator at home.  Tolerated amb 32 feet in hallway with increased time and 25% VC's on proper walker to self distance.  Slightly forward posture due to recent ABD surgery.   Stairs             Wheelchair Mobility    Modified Rankin (Stroke Patients Only)       Balance                                            Cognition Arousal/Alertness: Awake/alert Behavior During Therapy: WFL for tasks assessed/performed Overall Cognitive Status: Within Functional Limits for tasks assessed  General Comments: AxO x 3 very pleasant Lady, Highly motivated        Exercises      General Comments        Pertinent Vitals/Pain Pain Assessment Pain Assessment: Faces Faces Pain Scale: Hurts a little bit Pain Location: abdomen with activity Pain Descriptors / Indicators: Grimacing, Discomfort, Guarding Pain Intervention(s): Monitored during session    Home Living Family/patient expects to be discharged to:: Private residence Living Arrangements:  Children Available Help at Discharge: Family;Available 24 hours/day Type of Home: House Home Access: Stairs to enter Entrance Stairs-Rails: None Entrance Stairs-Number of Steps: 1   Home Layout: One level Home Equipment: Rollator (4 wheels);BSC/3in1;Grab bars - tub/shower;Shower seat Additional Comments: daughter currently living with patient, providing care day & night, pt's Husband died 3 months ago    Prior Function            PT Goals (current goals can now be found in the care plan section) Progress towards PT goals: Progressing toward goals    Frequency    Min 3X/week      PT Plan Current plan remains appropriate    Co-evaluation              AM-PAC PT "6 Clicks" Mobility   Outcome Measure  Help needed turning from your back to your side while in a flat bed without using bedrails?: A Little Help needed moving from lying on your back to sitting on the side of a flat bed without using bedrails?: A Little Help needed moving to and from a bed to a chair (including a wheelchair)?: A Little Help needed standing up from a chair using your arms (e.g., wheelchair or bedside chair)?: A Little Help needed to walk in hospital room?: A Little Help needed climbing 3-5 steps with a railing? : A Lot 6 Click Score: 17    End of Session Equipment Utilized During Treatment: Gait belt Activity Tolerance: Patient tolerated treatment well Patient left: in chair;with call bell/phone within reach;with chair alarm set Nurse Communication: Mobility status PT Visit Diagnosis: Muscle weakness (generalized) (M62.81);Other abnormalities of gait and mobility (R26.89)     Time: 1352-1415 PT Time Calculation (min) (ACUTE ONLY): 23 min  Charges:  $Gait Training: 8-22 mins $Therapeutic Activity: 8-22 mins     Rica Koyanagi  PTA Acute  Rehabilitation Services Office M-F          [phone] Weekend pager [phone]

## 2022-10-31 NOTE — Progress Notes (Signed)
ANTICOAGULATION CONSULT NOTE - Initial Consult  Pharmacy Consult for heparin IV Indication: atrial fibrillation  Allergies  Allergen Reactions   Atropine Other (See Comments)    Made her entire body jump after they gave it to her.   Miralax [Polyethylene Glycol] Other (See Comments)    17g dose caused stomach troubles for 3 days    Prozac [Fluoxetine] Other (See Comments)    Hyponatremia    Serotonin Reuptake Inhibitors (Ssris) Other (See Comments)    Hyponatremia    Keflex [Cephalexin] Other (See Comments)    Unknown reaction Tolerated Cefdinir 05/2022    Patient Measurements: Height: '5\' 6"'$  (167.6 cm) Weight: 95 kg (209 lb 7 oz) IBW/kg (Calculated) : 59.3 Heparin Dosing Weight: 80 kg  Vital Signs: Temp: 98.1 F (36.7 C) (01/26 2047) Temp Source: Oral (01/26 1331) BP: 109/47 (01/26 2047) Pulse Rate: 60 (01/26 2047)  Labs: Recent Labs    10/29/22 0414 10/30/22 0837 10/30/22 1700 10/31/22 1234 10/31/22 2112  HGB 10.5*  --  12.1 11.0*  --   HCT 33.2*  --  39.5 35.7*  --   PLT 241  --  261 247  --   APTT  --  47*  --   --  117*  HEPARINUNFRC  --  >1.10*  --   --  >1.10*  CREATININE 1.28*  --  1.01* 1.15*  --      Estimated Creatinine Clearance: 38.5 mL/min (A) (by C-G formula based on SCr of 1.15 mg/dL (H)).   Medical History: Past Medical History:  Diagnosis Date   Chronic combined systolic and diastolic heart failure ([place]) 08/26/2022   Dyspnea    diastolic dysfunction by echo 2012   GERD (gastroesophageal reflux disease)    Hypertension    Mild mitral and aortic regurgitation    Osteoarthritis    Presence of permanent cardiac pacemaker    Right bundle branch block 10/06/2012   Thyroid disease     Medications:  Medications Prior to Admission  Medication Sig Dispense Refill Last Dose   acetaminophen (TYLENOL) 500 MG tablet Take 1,000 mg by mouth every 6 (six) hours as needed for mild pain.   Past Week   amiodarone (PACERONE) 200 MG tablet Take 1  tablet (200 mg total) by mouth daily. 90 tablet 2 10/30/2022   apixaban (ELIQUIS) 5 MG TABS tablet Take 1 tablet (5 mg total) by mouth 2 (two) times daily. 180 tablet 1 10/27/2022 at 0900   benzonatate (TESSALON PERLES) 100 MG capsule Take 1 capsule (100 mg total) by mouth 3 (three) times daily as needed for cough. 20 capsule 0 Past Week   carboxymethylcellulose (REFRESH PLUS) 0.5 % SOLN Place 1 drop into both eyes daily as needed (dry eyes).   Past Month   Cholecalciferol (VITAMIN D-3 PO) Take 1 capsule by mouth daily with lunch.   Past Week   Cyanocobalamin (VITAMIN B-12 PO) Take 1 tablet by mouth daily with lunch.   Past Week   Dextromethorphan HBr (DELSYM PO) Take 10 mLs by mouth 2 (two) times daily as needed (cough).   Past Week   famotidine (PEPCID) 20 MG tablet Take 1 tablet (20 mg total) by mouth at bedtime. 30 tablet 2 Past Week   fluticasone (FLONASE) 50 MCG/ACT nasal spray Place 1 spray into both nostrils daily.   10/27/2022   fluticasone-salmeterol (WIXELA INHUB) 100-50 MCG/ACT AEPB Inhale 1 puff into the lungs 2 (two) times daily. 1 each 5 Past Week   furosemide (LASIX) 20 MG  tablet Take '40mg'$  (2 tablets) daily. May take additional '20mg'$  (1 tablet) as needed for weight gain of 2 pounds overnight or 5 pounds in one week (Patient taking differently: Take 20 mg by mouth daily.) 90 tablet 3 10/27/2022   HYDROcodone-acetaminophen (NORCO) 5-325 MG tablet Take 1 tablet by mouth every 6 (six) hours as needed for moderate pain. (Patient taking differently: Take 0.5 tablets by mouth 2 (two) times daily as needed for moderate pain.) 20 tablet 0 Past Week   ipratropium (ATROVENT) 0.06 % nasal spray Place 1 spray into both nostrils 2 (two) times daily.   10/27/2022   ketoconazole (NIZORAL) 2 % cream Apply 1 Application topically 2 (two) times daily as needed for rash.   Past Month   levothyroxine (SYNTHROID) 200 MCG tablet Take 200 mcg by mouth daily.   10/27/2022   levothyroxine (SYNTHROID) 25 MCG tablet  Take 25 mcg by mouth daily.   10/27/2022   LORazepam (ATIVAN) 0.5 MG tablet Take 0.5 mg by mouth at bedtime.   Past Week   lovastatin (MEVACOR) 40 MG tablet Take 40 mg by mouth daily with supper.   Past Week   metoprolol succinate (TOPROL-XL) 25 MG 24 hr tablet Take 0.5 tablets (12.5 mg total) by mouth daily. 45 tablet 3 10/27/2022 at 0900   Multiple Vitamins-Minerals (EYE VITAMINS) CAPS Take 1 capsule by mouth daily with lunch.   Past Week   omeprazole (PRILOSEC) 10 MG capsule Take 10 mg by mouth every morning.   10/27/2022   ondansetron (ZOFRAN) 4 MG tablet Take 1 tablet (4 mg total) by mouth every 6 (six) hours. (Patient taking differently: Take 4 mg by mouth every 6 (six) hours as needed for nausea or vomiting.) 12 tablet 0 Past Week   potassium chloride SA (KLOR-CON M) 20 MEQ tablet Take 1 tablet (20 mEq total) by mouth daily. 90 tablet 3 10/27/2022   sacubitril-valsartan (ENTRESTO) 24-26 MG Take 1 tablet by mouth 2 (two) times daily. 180 tablet 3 10/27/2022   spironolactone (ALDACTONE) 25 MG tablet Take 0.5 tablets (12.5 mg total) by mouth daily. 45 tablet 3 10/27/2022   Thiamine HCl (THIAMINE PO) Take 1 tablet by mouth daily with lunch. Vitamin B-1, unknown strength.   Past Week   triamcinolone cream (KENALOG) 0.1 % Apply 1 Application topically 2 (two) times daily as needed (flare ups).   Past Month   levothyroxine (SYNTHROID) 75 MCG tablet Take 3 tablets (225 mcg total) by mouth daily at 6 (six) AM. (Patient not taking: Reported on 10/28/2022) 90 tablet 0 Not Taking   polyethylene glycol (MIRALAX) 17 g packet Take 17 g by mouth daily. (Patient not taking: Reported on 10/28/2022) 14 each 0 Not Taking   Scheduled:   acetaminophen  1,000 mg Oral Q6H   amiodarone  200 mg Oral Daily   docusate sodium  100 mg Oral BID   famotidine  20 mg Oral QHS   feeding supplement  1 Container Oral BID BM   [START ON 11/03/2022] levothyroxine  112 mcg Intravenous Daily   metoprolol tartrate  5 mg Intravenous Q8H    mometasone-formoterol  2 puff Inhalation BID   pantoprazole  40 mg Oral Daily   PRN: albuterol, bisacodyl, hydrALAZINE, HYDROmorphone (DILAUDID) injection, ketoconazole, LORazepam, methocarbamol (ROBAXIN) IV, ondansetron **OR** ondansetron (ZOFRAN) IV, oxyCODONE, polyethylene glycol, sorbitol  Assessment: 37 yoF with PMH Afib s/p PPM, on Eliquis PTA, combined CHF, HTN, CKD3, admitted for repair of incarcerated umbilical hernia. Afib held for procedure; now POD1 and per  Surgery team OK to start heparin infusion. Anticipating return to [place] in 24-48 hr if stable  Baseline aPTT slightly elevated (recent SQH); heparin level elevated likely d/t recent SQH as well as residual Eliquis. Prior anticoagulation: Eliquis 5 mg PO bid; last dose 1/22 in AM  Significant events: - 1/25: Dx lap w/ lap cholecystectomy, repair of incarcerated umbilical hernia  Today, 10/31/2022: CBC: improved to WNL postop SCr actually below baseline (~1.2) postop, likely from hydration No bleeding or infusion issues per nursing  2112 aPTT 117 supra-therapeutic on 1100 units/hr HL > 1.1 Per RN no bleeding  Goal of Therapy: Heparin level 0.3-0.7 units/ml Monitor platelets by anticoagulation protocol: Yes  Plan: Decrease heparin drip to 0900 units/hr Check aPTT/heparin level in 8 hrs  Daily CBC, daily heparin level once stable Monitor for signs of bleeding or thrombosis  Dolly Rias RPh 10/31/2022, 11:17 PM

## 2022-10-31 NOTE — Progress Notes (Signed)
1 Day Post-Op   Subjective/Chief Complaint: Did not sleep well last night due to some abdominal soreness and alarms going off in her room. States she feels a little confused this am but has good insight and is able to answer questions appropriately. Nauseous this am and one episode of small volume bilious emesis. She is sipping on clear liquids. Abdominal pain is not severe and is actually improved in RLQ. Denies [facility]  Daughter Melissa at bedside  Objective: Vital signs in last 24 hours: Temp:  [97.4 F (36.3 C)-98 F (36.7 C)] 97.8 F (36.6 C) (01/26 0453) Pulse Rate:  [59-62] 60 (01/26 0453) Resp:  [15-25] 20 (01/26 0453) BP: (116-145)/(40-114) 124/61 (01/26 0453) SpO2:  [93 %-100 %] 97 % (01/26 0855) Weight:  [93 kg-95 kg] 95 kg (01/26 0500) Last BM Date : 10/28/22  Intake/Output from previous day: 01/25 0701 - 01/26 0700 In: 1100 [I.V.:1000; IV Piggyback:100] Out: 220 [Urine:200; Blood:20] Intake/Output this shift: Total I/O In: -  Out: 200 [Urine:200]  Exam: GEN:she is awake and alert. She does not appear to be in distress RESP: effort unlabored on 1lpm O2 via [place] ABD: soft and appropriately TTP over incisions which are c/d/I with bandages. Very mild TTP RLQ which is improved from my prior exams   Lab Results:  Recent Labs    10/29/22 0414 10/30/22 1700  WBC 5.7 8.5  HGB 10.5* 12.1  HCT 33.2* 39.5  PLT 241 261    BMET Recent Labs    10/29/22 0414 10/30/22 1700  NA 131* 132*  K 4.6 3.8  CL 103 109  CO2 20* 16*  GLUCOSE 116* 138*  BUN 9 6*  CREATININE 1.28* 1.01*  CALCIUM 8.1* 8.4*    PT/INR No results for input(s): "LABPROT", "INR" in the last 72 hours. ABG No results for input(s): "PHART", "HCO3" in the last 72 hours.  Invalid input(s): "PCO2", "PO2"  Studies/Results: No results found.  Anti-infectives: Anti-infectives (From admission, onward)    Start     Dose/Rate Route Frequency Ordered Stop   10/30/22 1115  ceFAZolin (ANCEF) IVPB  2g/100 mL premix        2 g 200 mL/hr over 30 Minutes Intravenous On call to O.R. 10/30/22 1109 10/30/22 1439       Assessment/Plan: Incarcerated umbilical hernia containing fat Abdominal pain Gallbladder dysfunction - reduced EF POD1 s/p Diagnostic laparoscopy, Laparoscopic cholecystectomy, Primary repair of chronically incarcerated, omentum-containing umbilical hernia 0/35 - Dr. Kae Heller - findings of inflammatory adhesions. Biopsies taken and pending - expected post op pain this am - multimodal pain control - ongoing n/v with CLD. Continue CLD and breeze. If symptoms improve today okay to slowly advance - on eliquis at baseline - last dose 1/22 am. Continue to hold. Okay for heparin gtt if indicated. Can likely resume PO anticoag soon - encouraged IS use and mobilization today  FEN: CLD, breeze ID: ancef periop VTE: heparin subq  Per primary Hyponatremia Hypothyroidism A fib s/p PPM - on eliquis baseline GERD Hyperlipidemia CKD stage IIIb   LOS: 4 days   Winferd Humphrey, [facility] 10/31/2022, 10:29 AM Please see Amion for pager number during day hours 7:00am-4:30pm

## 2022-11-01 DIAGNOSIS — R112 Nausea with vomiting, unspecified: Secondary | ICD-10-CM | POA: Diagnosis not present

## 2022-11-01 LAB — COMPREHENSIVE METABOLIC PANEL
ALT: 12 U/L (ref 0–44)
AST: 17 U/L (ref 15–41)
Albumin: 2.5 g/dL — ABNORMAL LOW (ref 3.5–5.0)
Alkaline Phosphatase: 59 U/L (ref 38–126)
Anion gap: 6 (ref 5–15)
BUN: 13 mg/dL (ref 8–23)
CO2: 18 mmol/L — ABNORMAL LOW (ref 22–32)
Calcium: 8.1 mg/dL — ABNORMAL LOW (ref 8.9–10.3)
Chloride: 107 mmol/L (ref 98–111)
Creatinine, Ser: 1.22 mg/dL — ABNORMAL HIGH (ref 0.44–1.00)
GFR, Estimated: 42 mL/min — ABNORMAL LOW (ref >60)
Glucose, Bld: 101 mg/dL — ABNORMAL HIGH (ref 70–99)
Potassium: 4.5 mmol/L (ref 3.5–5.1)
Sodium: 131 mmol/L — ABNORMAL LOW (ref 135–145)
Total Bilirubin: 0.2 mg/dL — ABNORMAL LOW (ref 0.3–1.2)
Total Protein: 5 g/dL — ABNORMAL LOW (ref 6.5–8.1)

## 2022-11-01 LAB — HEPARIN LEVEL (UNFRACTIONATED): Heparin Unfractionated: >1.1 IU/mL — ABNORMAL HIGH (ref 0.30–0.70)

## 2022-11-01 LAB — CBC
HCT: 31.7 % — ABNORMAL LOW (ref 36.0–46.0)
HCT: 30.5 % — ABNORMAL LOW (ref 36.0–46.0)
Hemoglobin: 10 g/dL — ABNORMAL LOW (ref 12.0–15.0)
Hemoglobin: 9.7 g/dL — ABNORMAL LOW (ref 12.0–15.0)
MCH: 28.7 pg (ref 26.0–34.0)
MCH: 28.5 pg (ref 26.0–34.0)
MCHC: 31.5 g/dL (ref 30.0–36.0)
MCHC: 31.8 g/dL (ref 30.0–36.0)
MCV: 90.8 fL (ref 80.0–100.0)
MCV: 89.7 fL (ref 80.0–100.0)
Platelets: 228 10*3/uL (ref 150–400)
Platelets: 220 10*3/uL (ref 150–400)
RBC: 3.49 MIL/uL — ABNORMAL LOW (ref 3.87–5.11)
RBC: 3.4 MIL/uL — ABNORMAL LOW (ref 3.87–5.11)
RDW: 13.5 % (ref 11.5–15.5)
RDW: 13.6 % (ref 11.5–15.5)
WBC: 9.2 10*3/uL (ref 4.0–10.5)
WBC: 7.6 10*3/uL (ref 4.0–10.5)
nRBC: 0 % (ref 0.0–0.2)
nRBC: 0 % (ref 0.0–0.2)

## 2022-11-01 LAB — APTT
aPTT: 113 seconds — ABNORMAL HIGH (ref 24–36)
aPTT: 79 seconds — ABNORMAL HIGH (ref 24–36)
aPTT: 55 seconds — ABNORMAL HIGH (ref 24–36)

## 2022-11-01 LAB — GLUCOSE, CAPILLARY: Glucose-Capillary: 92 mg/dL (ref 70–99)

## 2022-11-01 MED ORDER — HEPARIN (PORCINE) 25000 UT/250ML-% IV SOLN
1000.0000 [IU]/h | INTRAVENOUS | Status: DC
  Administered 2022-11-01 (×2): 900 [IU]/h via INTRAVENOUS
  Filled 2022-11-01 (×2): qty 250

## 2022-11-01 MED ORDER — HEPARIN (PORCINE) 25000 UT/250ML-% IV SOLN: 1100.0000 [IU]/h | INTRAVENOUS | Status: DC

## 2022-11-01 MED ORDER — SODIUM CHLORIDE 0.9 % IV SOLN: INTRAVENOUS | Status: DC

## 2022-11-01 MED ORDER — METOPROLOL SUCCINATE ER 25 MG PO TB24
12.5000 mg | ORAL_TABLET | Freq: Every day | ORAL | Status: DC
  Administered 2022-11-01 – 2022-11-03 (×3): 12.5 mg via ORAL
  Filled 2022-11-01 (×3): qty 1

## 2022-11-01 MED ORDER — LEVOTHYROXINE SODIUM 25 MCG PO TABS
225.0000 ug | ORAL_TABLET | Freq: Every day | ORAL | Status: DC
  Administered 2022-11-02 – 2022-11-03 (×2): 225 ug via ORAL
  Filled 2022-11-01 (×2): qty 1

## 2022-11-01 NOTE — Progress Notes (Signed)
ANTICOAGULATION CONSULT NOTE - Follow Up Consult  Pharmacy Consult for Heparin Indication: atrial fibrillation  Allergies  Allergen Reactions   Atropine Other (See Comments)    Made her entire body jump after they gave it to her.   Miralax [Polyethylene Glycol] Other (See Comments)    17g dose caused stomach troubles for 3 days    Prozac [Fluoxetine] Other (See Comments)    Hyponatremia    Serotonin Reuptake Inhibitors (Ssris) Other (See Comments)    Hyponatremia    Keflex [Cephalexin] Other (See Comments)    Unknown reaction Tolerated Cefdinir 05/2022    Patient Measurements: Height: '5\' 6"'$  (167.6 cm) Weight: 95 kg (209 lb 7 oz) IBW/kg (Calculated) : 59.3 Heparin Dosing Weight:  79.8 kg  Vital Signs: Temp: 98.3 F (36.8 C) (01/27 1155) Temp Source: Oral (01/27 0520) BP: 127/53 (01/27 1155) Pulse Rate: 63 (01/27 1155)  Labs: Recent Labs    10/30/22 0837 10/30/22 1700 10/30/22 1700 10/31/22 1234 10/31/22 2112 11/01/22 0808 11/01/22 1639  HGB  --  12.1   < > 11.0*  --  10.0*  --   HCT  --  39.5  --  35.7*  --  31.7*  --   PLT  --  261  --  247  --  228  --   APTT 47*  --   --   --  117* 113* 79*  HEPARINUNFRC >1.10*  --   --   --  >1.10* >1.10*  --   CREATININE  --  1.01*  --  1.15*  --  1.22*  --    < > = values in this interval not displayed.     Estimated Creatinine Clearance: 36.3 mL/min (A) (by C-G formula based on SCr of 1.22 mg/dL (H)).   Assessment:  AC/Heme: apixa 5 bid PTA for afib, LD 1/22 AM. Eliquis held for GI surgery. - Hgb 10 down, Plts WNL, Hep level remains >1.1 due to Eliquis. APTT 113 remains elevated, not clearing, hold infusion x 17mn - Next aPTT 79 now in goal.  Goal of Therapy:  Heparin level 0.3-0.7 units/ml aPTT 66-102 Monitor platelets by anticoagulation protocol: Yes   Plan:  Con't IV heparin at 900 units/hr Confirm level in 6 hrs.   Jonavin Seder S. RAlford Highland PharmD, BCPS Clinical Staff Pharmacist [email or web address] RAlford Highland  CThe Timken Company1/27/2024,5:19 PM

## 2022-11-01 NOTE — Plan of Care (Signed)

## 2022-11-01 NOTE — Progress Notes (Signed)
PROGRESS NOTE    MAHARI VANKIRK  OAC:[id number] DOB: 1931/06/13 DOA: 10/27/2022 PCP: Merrilee Seashore, MD    Brief Narrative:  [name] is a 89 y.o. female with medical history significant of chronic combined syst and diastolic CHF, HTN, CKD stage III, Afib s/p PPM, GERD, hx of incarcerated umbilical hernia who was to have repair of hernia by general surgery, Dr. Kae Heller on Friday, 10/31/2022 who had presented to preop testing and noted to have had intractable nausea vomiting and worsening periumbilical and right lower quadrant abdominal pain over the past 2 to 3 days with some associated shortness of breath, diarrhea, lightheadedness, right flank pain in addition.  Patient denies any fevers, no chills, no chest pain, no constipation, no melena, no hematemesis, no hematochezia.  Patient seen at preop testing, call was made to general surgery's office and patient advised to present to the ED.  Patient noted to have been seen in the ED on 10/07/2022 with similar symptoms.   ED Course: Patient seen in the ED, comprehensive metabolic profile obtained with a sodium of 127, potassium of 5.3, chloride of 96, glucose of 102, creatinine of 1.35, albumin of 3.4 otherwise within normal limits.  Lactic acid level noted at 1.3.  CBC unremarkable.  Repeat CT abdomen and pelvis done with moderate fat-containing periumbilical hernia is not substantially changed with persistent increased density, favored to reflect marked fat stranding rather than underlying soft tissue lesion.  Slightly increased small volume free fluid and mesenteric stranding nonspecific may be reactive, small hiatal hernia, colonic diverticulosis without acute diverticulitis, aortic atherosclerosis.  ED PA spoke with general surgery who had recommended medical admission and general surgery will consult in the morning.  Assessment & Plan:   Principal Problem:   Intractable nausea and vomiting Active Problems:   Incarcerated hernia    Atrial fibrillation with RVR (HCC)   Hyponatremia   Essential hypertension   Hypothyroidism   HLD (hyperlipidemia)   Chronic combined systolic and diastolic heart failure (HCC)   Dehydration   Hyperkalemia   Umbilical hernia without obstruction or gangrene   #1 intractable nausea and vomiting likely secondary to incarcerated umbilical hernia gallbladder dysfunction -Patient presented with intractable nausea vomiting periumbilical pain and right lower quadrant pain worsening over the past 2 to 3 days with inability to keep anything down. -CT abdomen and pelvis done with moderate fat-containing periumbilical hernia is not substantially changed with persistent increased density, favored to reflect marked fat stranding rather than underlying soft tissue lesion. -General surgery following s/p diagnostic laparoscopy and cholecystectomy with umbilical hernia repair.   She reports decreased pain, passing gas Ambulated in hallway Tolerating clears Defer to general surgery when Eliquis can be restarted   2.  Hyponatremia stable at 131 -Likely secondary to hypovolemic hyponatremia in the setting of intractable nausea and vomiting x 2 to 3 days, diuretics of Lasix, spironolactone, Entresto. -Hold antihypertensive medications of spironolactone and Entresto and Lasix. -IV fluids.   3.  Hyperkalemia resolved -Likely secondary to spironolactone, Entresto. -4.  Hypothyroidism Placed on home regimen Synthroid '1 1 2 '$ mcg IV daily.   5.  A-fib status post PPM -Resume home regimen amiodarone.  Hold Toprol-XL and placed on Lopressor 5 mg IV every 8 hours.  eliquis on hold Heparin gtt  6.  GERD -IV PPI.   7.  Hyperlipidemia -Hold statin.  8.  CKD stage IIIb Cr trending up Will order time limited ivf  Pressure Injury 12/18/21 Sacrum Mid Stage 2 -  Partial thickness  loss of dermis presenting as a shallow open injury with a red, pink wound bed without slough. prior moisture associated skin damage  now open (Active)  12/18/21 1900  Location: Sacrum  Location Orientation: Mid  Staging: Stage 2 -  Partial thickness loss of dermis presenting as a shallow open injury with a red, pink wound bed without slough.  Wound Description (Comments): prior moisture associated skin damage now open  Present on Admission: No     Pressure Injury 10/28/22 Buttocks Mid Stage 1 -  Intact skin with non-blanchable redness of a localized area usually over a bony prominence. (Active)  10/28/22 0800  Location: Buttocks  Location Orientation: Mid  Staging: Stage 1 -  Intact skin with non-blanchable redness of a localized area usually over a bony prominence.  Wound Description (Comments):   Present on Admission: Yes  Dressing Type Foam - Lift dressing to assess site every shift 10/31/22 0850     Estimated body mass index is 33.8 kg/m as calculated from the following:   Height as of this encounter: '5\' 6"'$  (1.676 m).   Weight as of this encounter: 95 kg.  DVT prophylaxis:      SCDs Code Status:              DNR Family Communication:       Updated patient, [name] at bedside. Disposition Plan:              Patient is from:                        Home             Anticipated DC to:                   Home             Anticipated DC date:               TBD             Anticipated DC barriers:         Clinical improvement            Consults called:        General surgery Admission status:     Admit to progressive care/inpatient Subjective: Reports flatus  Less pain Objective: Vitals:   10/31/22 2015 10/31/22 2047 11/01/22 0520 11/01/22 0730  BP:  (!) 109/47 (!) 137/58   Pulse:  60 78   Resp:  18 17   Temp:  98.1 F (36.7 C) (!) 97.5 F (36.4 C)   TempSrc:   Oral   SpO2: 98% 96% 99% 98%  Weight:      Height:        Intake/Output Summary (Last 24 hours) at 11/01/2022 1055 Last data filed at 11/01/2022 0530 Gross per 24 hour  Intake 1522 ml  Output 200 ml  Net 1322 ml    Filed  Weights   10/30/22 0500 10/30/22 1124 10/31/22 0500  Weight: 93 kg 93 kg 95 kg    Examination:  General exam: Appears in nad  Respiratory system: Clear to auscultation. Respiratory effort normal. Cardiovascular system: S1 & S2 heard, RRR. No JVD, murmurs, rubs, gallops or clicks. No pedal edema. Gastrointestinal system: Abdomen is nondistended, soft and umbilical hernia tender. No organomegaly or masses felt. Normal bowel sounds heard. Central nervous system: Alert and oriented. No focal neurological deficits. Extremities: no edema. Skin: No rashes, lesions or  ulcers Psychiatry: Judgement and insight appear normal. Mood & affect appropriate.   Data Reviewed: I have personally reviewed following labs and imaging studies  CBC: Recent Labs  Lab 10/27/22 1907 10/28/22 0740 10/29/22 0414 10/30/22 1700 10/31/22 1234 11/01/22 0808  WBC 7.3 6.5 5.7 8.5 11.5* 9.2  NEUTROABS 5.0  --  3.7  --   --   --   HGB 11.6* 12.6 10.5* 12.1 11.0* 10.0*  HCT 35.9* 39.2 33.2* 39.5 35.7* 31.7*  MCV 86.9 88.1 90.2 92.5 91.1 90.8  PLT 255 290 241 261 247 128    Basic Metabolic Panel: Recent Labs  Lab 10/27/22 1907 10/28/22 0740 10/29/22 0414 10/30/22 1700 10/31/22 1234 11/01/22 0808  NA 128* 131* 131* 132* 134* 131*  K 4.5 4.7 4.6 3.8 4.7 4.5  CL 97* 99 103 109 107 107  CO2 20* 22 20* 16* 17* 18*  GLUCOSE 91 88 116* 138* 172* 101*  BUN '13 13 9 '$ 6* 9 13  CREATININE 1.34* 1.39* 1.28* 1.01* 1.15* 1.22*  CALCIUM 8.5* 9.2 8.1* 8.4* 8.3* 8.1*  MG 2.0  --  1.7  --   --   --   PHOS 2.8  --   --   --   --   --     GFR: Estimated Creatinine Clearance: 36.3 mL/min (A) (by C-G formula based on SCr of 1.22 mg/dL (H)). Liver Function Tests: Recent Labs  Lab 10/28/22 0740 10/29/22 0414 10/30/22 1700 10/31/22 1234 11/01/22 0808  AST 16 13* '17 24 17  '$ ALT '9 8 10 11 12  '$ ALKPHOS 79 56 78 70 59  BILITOT 0.6 0.5 0.3 0.4 0.2*  PROT 7.3 5.4* 6.3* 5.6* 5.0*  ALBUMIN 3.5 2.6* 3.2* 2.6* 2.5*     Recent Labs  Lab 10/27/22 1420  LIPASE 27    No results for input(s): "AMMONIA" in the last 168 hours. Coagulation Profile: No results for input(s): "INR", "PROTIME" in the last 168 hours. Cardiac Enzymes: No results for input(s): "CKTOTAL", "CKMB", "CKMBINDEX", "TROPONINI" in the last 168 hours. BNP (last 3 results) No results for input(s): "PROBNP" in the last 8760 hours. HbA1C: No results for input(s): "HGBA1C" in the last 72 hours. CBG: Recent Labs  Lab 10/28/22 0742 10/30/22 0750 10/31/22 0733 11/01/22 0738  GLUCAP 89 92 174* 92    Lipid Profile: No results for input(s): "CHOL", "HDL", "LDLCALC", "TRIG", "CHOLHDL", "LDLDIRECT" in the last 72 hours. Thyroid Function Tests: No results for input(s): "TSH", "T4TOTAL", "FREET4", "T3FREE", "THYROIDAB" in the last 72 hours. Anemia Panel: No results for input(s): "VITAMINB12", "FOLATE", "FERRITIN", "TIBC", "IRON", "RETICCTPCT" in the last 72 hours. Sepsis Labs: Recent Labs  Lab 10/27/22 1420  LATICACIDVEN 1.3     Recent Results (from the past 240 hour(s))  Urine Culture     Status: Abnormal   Collection Time: 10/27/22  8:48 PM   Specimen: Urine, Catheterized  Result Value Ref Range Status   Specimen Description   Final    URINE, CATHETERIZED Performed at [facility] [address], [place], [place] [address]    Special Requests   Final    NONE Performed at [facility], [place] [address] [place], [place] [address]    Culture MULTIPLE SPECIES PRESENT, SUGGEST RECOLLECTION (A)  Final   Report Status 10/29/2022 FINAL  Final         Radiology Studies: No results found.   Scheduled Meds:  acetaminophen  1,000 mg Oral Q6H   amiodarone  200 mg Oral  Daily   docusate sodium  100 mg Oral BID   famotidine  20 mg Oral QHS   feeding supplement  1 Container Oral BID BM   [START ON 11/02/2022] levothyroxine  225 mcg Oral Q0600   metoprolol tartrate  5 mg Intravenous Q8H    mometasone-formoterol  2 puff Inhalation BID   pantoprazole  40 mg Oral Daily   Continuous Infusions:  heparin     methocarbamol (ROBAXIN) IV       LOS: 5 days    Time spent: 37 min  Georgette Shell, MD 11/01/2022, 10:55 AM

## 2022-11-01 NOTE — Progress Notes (Signed)
2 Days Post-Op   Subjective/Chief Complaint: Denies any further nausea, passing some flatus  Objective: Vital signs in last 24 hours: Temp:  [97.5 F (36.4 C)-98.5 F (36.9 C)] 97.5 F (36.4 C) (01/27 0520) Pulse Rate:  [60-78] 78 (01/27 0520) Resp:  [17-20] 17 (01/27 0520) BP: (105-137)/(47-58) 137/58 (01/27 0520) SpO2:  [96 %-100 %] 98 % (01/27 0730) Last BM Date : 10/28/22  Intake/Output from previous day: 01/26 0701 - 01/27 0700 In: 1522 [P.O.:600; I.V.:922] Out: 400 [Urine:400] Intake/Output this shift: No intake/output data recorded.  Exam: GEN:she is awake and alert. She does not appear to be in distress RESP: effort unlabored  ABD: soft and appropriately TTP over incisions which are c/d/I with bandages.   Lab Results:  Recent Labs    10/31/22 1234 11/01/22 0808  WBC 11.5* 9.2  HGB 11.0* 10.0*  HCT 35.7* 31.7*  PLT 247 228    BMET Recent Labs    10/30/22 1700 10/31/22 1234  NA 132* 134*  K 3.8 4.7  CL 109 107  CO2 16* 17*  GLUCOSE 138* 172*  BUN 6* 9  CREATININE 1.01* 1.15*  CALCIUM 8.4* 8.3*    PT/INR No results for input(s): "LABPROT", "INR" in the last 72 hours. ABG No results for input(s): "PHART", "HCO3" in the last 72 hours.  Invalid input(s): "PCO2", "PO2"  Studies/Results: No results found.  Anti-infectives: Anti-infectives (From admission, onward)    Start     Dose/Rate Route Frequency Ordered Stop   10/30/22 1115  ceFAZolin (ANCEF) IVPB 2g/100 mL premix        2 g 200 mL/hr over 30 Minutes Intravenous On call to O.R. 10/30/22 1109 10/30/22 1439       Assessment/Plan: Incarcerated umbilical hernia containing fat Abdominal pain Gallbladder dysfunction - reduced EF POD2 s/p Diagnostic laparoscopy, Laparoscopic cholecystectomy, Primary repair of chronically incarcerated, omentum-containing umbilical hernia 5/94 - Dr. Kae Heller - findings of inflammatory adhesions. Biopsies taken and pending - expected post op pain this am -  multimodal pain control - n/v improved, advance to fulls - on eliquis at baseline - last dose 1/22 am. Continue to hold. Okay for heparin gtt if indicated. Can likely resume PO anticoag soon - encouraged IS use and mobilization today  FEN: FLD, breeze ID: ancef periop VTE: heparin subq  Per primary Hyponatremia Hypothyroidism A fib s/p PPM - on eliquis baseline GERD Hyperlipidemia CKD stage IIIb   LOS: 5 days   Rosario Adie, [facility] 11/01/2022, 9:16 AM Please see Amion for pager number during day hours 7:00am-4:30pm

## 2022-11-01 NOTE — Progress Notes (Signed)
Mobility Specialist - Progress Note   11/01/22 1428  Mobility  Activity Transferred to/from [facility];Ambulated with assistance in hallway  Level of Assistance Minimal assist, patient does 75% or more  Assistive Device Front wheel walker  Distance Ambulated (ft) 56 ft  Range of Motion/Exercises Active  Activity Response Tolerated well  Mobility Referral Yes  $Mobility charge 1 Mobility   Pt was found on recliner chair and agreeable to ambulate after using BSC. Pt was min-A going from STS and SB for ambulation. At EOS returned to bed with all necessities in reach.  Ferd Hibbs Mobility Specialist

## 2022-11-01 NOTE — Progress Notes (Signed)
ANTICOAGULATION CONSULT NOTE - Follow Up Consult  Pharmacy Consult for Heparin Indication: atrial fibrillation  Allergies  Allergen Reactions   Atropine Other (See Comments)    Made her entire body jump after they gave it to her.   Miralax [Polyethylene Glycol] Other (See Comments)    17g dose caused stomach troubles for 3 days    Prozac [Fluoxetine] Other (See Comments)    Hyponatremia    Serotonin Reuptake Inhibitors (Ssris) Other (See Comments)    Hyponatremia    Keflex [Cephalexin] Other (See Comments)    Unknown reaction Tolerated Cefdinir 05/2022    Patient Measurements: Height: '5\' 6"'$  (167.6 cm) Weight: 95 kg (209 lb 7 oz) IBW/kg (Calculated) : 59.3 Heparin Dosing Weight:  79.8 kg  Vital Signs: Temp: 97.5 F (36.4 C) (01/27 0520) Temp Source: Oral (01/27 0520) BP: 137/58 (01/27 0520) Pulse Rate: 78 (01/27 0520)  Labs: Recent Labs    10/30/22 0837 10/30/22 1700 10/30/22 1700 10/31/22 1234 10/31/22 2112 11/01/22 0808  HGB  --  12.1   < > 11.0*  --  10.0*  HCT  --  39.5  --  35.7*  --  31.7*  PLT  --  261  --  247  --  228  APTT 47*  --   --   --  117* 113*  HEPARINUNFRC >1.10*  --   --   --  >1.10* >1.10*  CREATININE  --  1.01*  --  1.15*  --  1.22*   < > = values in this interval not displayed.    Estimated Creatinine Clearance: 36.3 mL/min (A) (by C-G formula based on SCr of 1.22 mg/dL (H)).   Assessment:  AC/Heme: apixa 5 bid PTA for afib, LD 1/22 AM. Eliquis held for GI surgery. - Hgb 10 down, Plts WNL, Hep level remains >1.1 due to Eliquis. APTT 113 remains elevated, not clearing  Goal of Therapy:  Heparin level 0.3-0.7 units/ml aPTT 66-102 Monitor platelets by anticoagulation protocol: Yes   Plan:  Hold IV heparin 28mn, then resume at 900 units/hr. Will recheck aPTT 6 hr afterwards.   Anylah Scheib S. RAlford Highland PharmD, BCPS Clinical Staff Pharmacist [email or web address] RAlford Highland CThe [employer] Company1/27/2024,10:18 AM

## 2022-11-02 ENCOUNTER — Inpatient Hospital Stay (HOSPITAL_COMMUNITY): Payer: Medicare HMO

## 2022-11-02 DIAGNOSIS — R112 Nausea with vomiting, unspecified: Secondary | ICD-10-CM | POA: Diagnosis not present

## 2022-11-02 LAB — CBC
HCT: 34.1 % — ABNORMAL LOW (ref 36.0–46.0)
Hemoglobin: 10.1 g/dL — ABNORMAL LOW (ref 12.0–15.0)
MCH: 28.4 pg (ref 26.0–34.0)
MCHC: 29.6 g/dL — ABNORMAL LOW (ref 30.0–36.0)
MCV: 95.8 fL (ref 80.0–100.0)
Platelets: 218 10*3/uL (ref 150–400)
RBC: 3.56 MIL/uL — ABNORMAL LOW (ref 3.87–5.11)
RDW: 13.6 % (ref 11.5–15.5)
WBC: 7.1 10*3/uL (ref 4.0–10.5)
nRBC: 0 % (ref 0.0–0.2)

## 2022-11-02 LAB — BASIC METABOLIC PANEL
Anion gap: 8 (ref 5–15)
BUN: 11 mg/dL (ref 8–23)
CO2: 16 mmol/L — ABNORMAL LOW (ref 22–32)
Calcium: 8 mg/dL — ABNORMAL LOW (ref 8.9–10.3)
Chloride: 109 mmol/L (ref 98–111)
Creatinine, Ser: 1.07 mg/dL — ABNORMAL HIGH (ref 0.44–1.00)
GFR, Estimated: 50 mL/min — ABNORMAL LOW (ref >60)
Glucose, Bld: 83 mg/dL (ref 70–99)
Potassium: 4.3 mmol/L (ref 3.5–5.1)
Sodium: 133 mmol/L — ABNORMAL LOW (ref 135–145)

## 2022-11-02 LAB — HEPARIN LEVEL (UNFRACTIONATED): Heparin Unfractionated: 0.74 IU/mL — ABNORMAL HIGH (ref 0.30–0.70)

## 2022-11-02 LAB — APTT: aPTT: 67 seconds — ABNORMAL HIGH (ref 24–36)

## 2022-11-02 MED ORDER — FUROSEMIDE 10 MG/ML IJ SOLN
40.0000 mg | Freq: Four times a day (QID) | INTRAMUSCULAR | Status: AC
  Administered 2022-11-02 (×2): 40 mg via INTRAVENOUS
  Filled 2022-11-02 (×3): qty 4

## 2022-11-02 MED ORDER — ENSURE ENLIVE PO LIQD
237.0000 mL | Freq: Two times a day (BID) | ORAL | Status: DC
  Administered 2022-11-02 (×2): 237 mL via ORAL

## 2022-11-02 MED ORDER — FUROSEMIDE 10 MG/ML IJ SOLN
40.0000 mg | INTRAMUSCULAR | Status: AC
  Administered 2022-11-02: 40 mg via INTRAVENOUS
  Filled 2022-11-02: qty 4

## 2022-11-02 MED ORDER — APIXABAN 5 MG PO TABS
5.0000 mg | ORAL_TABLET | Freq: Two times a day (BID) | ORAL | Status: DC
  Administered 2022-11-02 – 2022-11-03 (×3): 5 mg via ORAL
  Filled 2022-11-02 (×3): qty 1

## 2022-11-02 NOTE — Progress Notes (Signed)
ANTICOAGULATION CONSULT NOTE - Follow Up Consult  Pharmacy Consult for Heparin Indication: atrial fibrillation  Allergies  Allergen Reactions   Atropine Other (See Comments)    Made her entire body jump after they gave it to her.   Miralax [Polyethylene Glycol] Other (See Comments)    17g dose caused stomach troubles for 3 days    Prozac [Fluoxetine] Other (See Comments)    Hyponatremia    Serotonin Reuptake Inhibitors (Ssris) Other (See Comments)    Hyponatremia    Keflex [Cephalexin] Other (See Comments)    Unknown reaction Tolerated Cefdinir 05/2022    Patient Measurements: Height: '5\' 6"'$  (167.6 cm) Weight: 95 kg (209 lb 7 oz) IBW/kg (Calculated) : 59.3 Heparin Dosing Weight:  79.8 kg  Vital Signs: Temp: 98.4 F (36.9 C) (01/27 2028) Temp Source: Oral (01/27 2028) BP: 122/52 (01/27 2028) Pulse Rate: 60 (01/27 2028)  Labs: Recent Labs    10/30/22 0837 10/30/22 1700 10/30/22 1700 10/31/22 1234 10/31/22 2112 11/01/22 0808 11/01/22 1639 11/01/22 2326  HGB  --  12.1   < > 11.0*  --  10.0*  --  9.7*  HCT  --  39.5   < > 35.7*  --  31.7*  --  30.5*  PLT  --  261   < > 247  --  228  --  220  APTT 47*  --   --   --  117* 113* 79* 55*  HEPARINUNFRC >1.10*  --   --   --  >1.10* >1.10*  --   --   CREATININE  --  1.01*  --  1.15*  --  1.22*  --   --    < > = values in this interval not displayed.     Estimated Creatinine Clearance: 36.3 mL/min (A) (by C-G formula based on SCr of 1.22 mg/dL (H)).   Assessment:  AC/Heme: apixa 5 bid PTA for afib, LD 1/22 AM. Eliquis held for GI surgery. Now Aptt subtherapeutic on 900 units/hr Hgb 9.7, plts WNL No bleeding noted  - Goal of Therapy:  Heparin level 0.3-0.7 units/ml aPTT 66-102 Monitor platelets by anticoagulation protocol: Yes   Plan:  Increase heparin drip to 1000 units/hr Check heparin level and aptt in 8 hours Daily CBC   Dolly Rias RPh 11/02/2022, 12:06 AM

## 2022-11-02 NOTE — Plan of Care (Signed)

## 2022-11-02 NOTE — Progress Notes (Signed)
PROGRESS NOTE    [name]  YKZ:[id number] DOB: 01/31/1931 DOA: 10/27/2022 PCP: Merrilee Seashore, MD    Brief Narrative:  [name] is a 89 y.o. female with medical history significant of chronic combined syst and diastolic CHF, HTN, CKD stage III, Afib s/p PPM, GERD, hx of incarcerated umbilical hernia who was to have repair of hernia by general surgery, Dr. Kae Heller on Friday, 10/31/2022 who had presented to preop testing and noted to have had intractable nausea vomiting and worsening periumbilical and right lower quadrant abdominal pain over the past 2 to 3 days with some associated shortness of breath, diarrhea, lightheadedness, right flank pain in addition.  Patient denies any fevers, no chills, no chest pain, no constipation, no melena, no hematemesis, no hematochezia.  Patient seen at preop testing, call was made to general surgery's office and patient advised to present to the ED.  Patient noted to have been seen in the ED on 10/07/2022 with similar symptoms.   ED Course: Patient seen in the ED, comprehensive metabolic profile obtained with a sodium of 127, potassium of 5.3, chloride of 96, glucose of 102, creatinine of 1.35, albumin of 3.4 otherwise within normal limits.  Lactic acid level noted at 1.3.  CBC unremarkable.  Repeat CT abdomen and pelvis done with moderate fat-containing periumbilical hernia is not substantially changed with persistent increased density, favored to reflect marked fat stranding rather than underlying soft tissue lesion.  Slightly increased small volume free fluid and mesenteric stranding nonspecific may be reactive, small hiatal hernia, colonic diverticulosis without acute diverticulitis, aortic atherosclerosis.  ED PA spoke with general surgery who had recommended medical admission and general surgery will consult in the morning.  Assessment & Plan:   Principal Problem:   Intractable nausea and vomiting Active Problems:   Incarcerated hernia    Atrial fibrillation with RVR (HCC)   Hyponatremia   Essential hypertension   Hypothyroidism   HLD (hyperlipidemia)   Chronic combined systolic and diastolic heart failure (HCC)   Dehydration   Hyperkalemia   Umbilical hernia without obstruction or gangrene   #1 intractable nausea and vomiting likely secondary to incarcerated umbilical hernia gallbladder dysfunction -Patient presented with intractable nausea vomiting periumbilical pain and right lower quadrant pain worsening over the past 2 to 3 days with inability to keep anything down. -CT abdomen and pelvis done with moderate fat-containing periumbilical hernia is not substantially changed with persistent increased density, favored to reflect marked fat stranding rather than underlying soft tissue lesion. -General surgery following s/p diagnostic laparoscopy and cholecystectomy with umbilical hernia repair.   She reports decreased pain, passing gas Ambulated in hallway Tolerating clears restart Eliquis    2.  Hyponatremia -due to hypervolemia Trial of lasix  stable at 131 Dc ivf   3.  Hyperkalemia resolved -Likely secondary to spironolactone, Entresto. -4.  Hypothyroidism Placed on home regimen Synthroid '1 1 2 '$ mcg IV daily.   5.  A-fib status post PPM -Resume home regimen amiodarone.  Hold Toprol-XL and placed on Lopressor 5 mg IV every 8 hours.  eliquis   6.  GERD -IV PPI.   7.  Hyperlipidemia -Hold statin.  8.  CKD stage IIIb- monitor renal functions   9 Diastolic heart failure-ef 55%  Appears fluid overloaded Dc ivf Lasix Chest xray  Pressure Injury 12/18/21 Sacrum Mid Stage 2 -  Partial thickness loss of dermis presenting as a shallow open injury with a red, pink wound bed without slough. prior moisture associated skin damage now  open (Active)  12/18/21 1900  Location: Sacrum  Location Orientation: Mid  Staging: Stage 2 -  Partial thickness loss of dermis presenting as a shallow open injury with a red, pink  wound bed without slough.  Wound Description (Comments): prior moisture associated skin damage now open  Present on Admission: No     Pressure Injury 10/28/22 Buttocks Mid Stage 1 -  Intact skin with non-blanchable redness of a localized area usually over a bony prominence. (Active)  10/28/22 0800  Location: Buttocks  Location Orientation: Mid  Staging: Stage 1 -  Intact skin with non-blanchable redness of a localized area usually over a bony prominence.  Wound Description (Comments):   Present on Admission: Yes  Dressing Type Foam - Lift dressing to assess site every shift 11/02/22 0853     Estimated body mass index is 33.8 kg/m as calculated from the following:   Height as of this encounter: '5\' 6"'$  (1.676 m).   Weight as of this encounter: 95 kg.  DVT prophylaxis:      SCDs Code Status:              DNR Family Communication:       Updated patient, [name] at bedside. Disposition Plan:              Patient is from:                        Home             Anticipated DC to:                   Home             Anticipated DC date:               TBD             Anticipated DC barriers:         Clinical improvement            Consults called:        General surgery Admission status:     Admit to progressive care/inpatient Subjective: C/o sob Placed on 2L overnight  Cxr pending   Objective: Vitals:   11/01/22 2028 11/01/22 2037 11/02/22 0552 11/02/22 0619  BP: (!) 122/52  (!) 154/59   Pulse: 60  64   Resp: 18  17   Temp: 98.4 F (36.9 C)  97.6 F (36.4 C)   TempSrc: Oral     SpO2: 100% 100% 100% 98%  Weight:      Height:        Intake/Output Summary (Last 24 hours) at 11/02/2022 0959 Last data filed at 11/02/2022 0600 Gross per 24 hour  Intake 2275.57 ml  Output --  Net 2275.57 ml    Filed Weights   10/30/22 0500 10/30/22 1124 10/31/22 0500  Weight: 93 kg 93 kg 95 kg    Examination:  General exam: Appears in nad  Respiratory system: Clear to  auscultation. Respiratory effort normal. Cardiovascular system: S1 & S2 heard, RRR. No JVD, murmurs, rubs, gallops or clicks. No pedal edema. Gastrointestinal system: Abdomen is nondistended, soft and umbilical hernia tender. No organomegaly or masses felt. Normal bowel sounds heard. Central nervous system: Alert and oriented. No focal neurological deficits. Extremities: no edema. Skin: No rashes, lesions or ulcers Psychiatry: Judgement and insight appear normal. Mood & affect appropriate.   Data Reviewed: I have personally reviewed  following labs and imaging studies  CBC: Recent Labs  Lab 10/27/22 1907 10/28/22 0740 10/29/22 0414 10/30/22 1700 10/31/22 1234 11/01/22 0808 11/01/22 2326  WBC 7.3   < > 5.7 8.5 11.5* 9.2 7.6  NEUTROABS 5.0  --  3.7  --   --   --   --   HGB 11.6*   < > 10.5* 12.1 11.0* 10.0* 9.7*  HCT 35.9*   < > 33.2* 39.5 35.7* 31.7* 30.5*  MCV 86.9   < > 90.2 92.5 91.1 90.8 89.7  PLT 255   < > 241 261 247 228 220   < > = values in this interval not displayed.    Basic Metabolic Panel: Recent Labs  Lab 10/27/22 1907 10/28/22 0740 10/29/22 0414 10/30/22 1700 10/31/22 1234 11/01/22 0808  NA 128* 131* 131* 132* 134* 131*  K 4.5 4.7 4.6 3.8 4.7 4.5  CL 97* 99 103 109 107 107  CO2 20* 22 20* 16* 17* 18*  GLUCOSE 91 88 116* 138* 172* 101*  BUN '13 13 9 '$ 6* 9 13  CREATININE 1.34* 1.39* 1.28* 1.01* 1.15* 1.22*  CALCIUM 8.5* 9.2 8.1* 8.4* 8.3* 8.1*  MG 2.0  --  1.7  --   --   --   PHOS 2.8  --   --   --   --   --     GFR: Estimated Creatinine Clearance: 36.3 mL/min (A) (by C-G formula based on SCr of 1.22 mg/dL (H)). Liver Function Tests: Recent Labs  Lab 10/28/22 0740 10/29/22 0414 10/30/22 1700 10/31/22 1234 11/01/22 0808  AST 16 13* '17 24 17  '$ ALT '9 8 10 11 12  '$ ALKPHOS 79 56 78 70 59  BILITOT 0.6 0.5 0.3 0.4 0.2*  PROT 7.3 5.4* 6.3* 5.6* 5.0*  ALBUMIN 3.5 2.6* 3.2* 2.6* 2.5*    Recent Labs  Lab 10/27/22 1420  LIPASE 27    No results for  input(s): "AMMONIA" in the last 168 hours. Coagulation Profile: No results for input(s): "INR", "PROTIME" in the last 168 hours. Cardiac Enzymes: No results for input(s): "CKTOTAL", "CKMB", "CKMBINDEX", "TROPONINI" in the last 168 hours. BNP (last 3 results) No results for input(s): "PROBNP" in the last 8760 hours. HbA1C: No results for input(s): "HGBA1C" in the last 72 hours. CBG: Recent Labs  Lab 10/28/22 0742 10/30/22 0750 10/31/22 0733 11/01/22 0738  GLUCAP 89 92 174* 92    Lipid Profile: No results for input(s): "CHOL", "HDL", "LDLCALC", "TRIG", "CHOLHDL", "LDLDIRECT" in the last 72 hours. Thyroid Function Tests: No results for input(s): "TSH", "T4TOTAL", "FREET4", "T3FREE", "THYROIDAB" in the last 72 hours. Anemia Panel: No results for input(s): "VITAMINB12", "FOLATE", "FERRITIN", "TIBC", "IRON", "RETICCTPCT" in the last 72 hours. Sepsis Labs: Recent Labs  Lab 10/27/22 1420  LATICACIDVEN 1.3     Recent Results (from the past 240 hour(s))  Urine Culture     Status: Abnormal   Collection Time: 10/27/22  8:48 PM   Specimen: Urine, Catheterized  Result Value Ref Range Status   Specimen Description   Final    URINE, CATHETERIZED Performed at [facility] [address], [place], [place] [address]    Special Requests   Final    NONE Performed at [facility], [place] [address] [place], [place] [address]    Culture MULTIPLE SPECIES PRESENT, SUGGEST RECOLLECTION (A)  Final   Report Status 10/29/2022 FINAL  Final         Radiology Studies: No results found.  Scheduled Meds:  acetaminophen  1,000 mg Oral Q6H   amiodarone  200 mg Oral Daily   apixaban  5 mg Oral BID   docusate sodium  100 mg Oral BID   famotidine  20 mg Oral QHS   feeding supplement  1 Container Oral BID BM   feeding supplement  237 mL Oral BID BM   levothyroxine  225 mcg Oral Q0600   metoprolol succinate  12.5 mg Oral Daily   mometasone-formoterol   2 puff Inhalation BID   pantoprazole  40 mg Oral Daily   Continuous Infusions:  methocarbamol (ROBAXIN) IV       LOS: 6 days    Time spent: 37 min  Georgette Shell, MD 11/02/2022, 9:59 AM

## 2022-11-02 NOTE — Progress Notes (Signed)
Mobility Specialist - Progress Note   11/02/22 1443  Mobility  Activity Ambulated with assistance in hallway  Level of Assistance Minimal assist, patient does 75% or more  Assistive Device Front wheel walker  Distance Ambulated (ft) 128 ft  Range of Motion/Exercises Active  Activity Response Tolerated well  Mobility Referral Yes  $Mobility charge 1 Mobility   Pt was found in bed and agreeable to ambulate. Had no complaints during session and at EOS returned to bed with all necessities in reach. Daughter in room.  Ferd Hibbs Mobility Specialist

## 2022-11-02 NOTE — Progress Notes (Addendum)
3 Days Post-Op   Subjective/Chief Complaint: Denies any further nausea, passing some flatus, no BM yet, feels SOB  Objective: Vital signs in last 24 hours: Temp:  [97.6 F (36.4 C)-98.4 F (36.9 C)] 97.6 F (36.4 C) (01/28 0552) Pulse Rate:  [60-64] 64 (01/28 0552) Resp:  [15-18] 17 (01/28 0552) BP: (122-154)/(52-59) 154/59 (01/28 0552) SpO2:  [96 %-100 %] 98 % (01/28 0619) Last BM Date : 10/28/22  Intake/Output from previous day: 01/27 0701 - 01/28 0700 In: 2635.6 [P.O.:1200; I.V.:1435.6] Out: -  Intake/Output this shift: No intake/output data recorded.  Exam: GEN:she is awake and alert. She does not appear to be in distress RESP: effort unlabored  ABD: soft and appropriately TTP over incisions which are c/d/I with bandages.   Lab Results:  Recent Labs    11/01/22 0808 11/01/22 2326  WBC 9.2 7.6  HGB 10.0* 9.7*  HCT 31.7* 30.5*  PLT 228 220    BMET Recent Labs    10/31/22 1234 11/01/22 0808  NA 134* 131*  K 4.7 4.5  CL 107 107  CO2 17* 18*  GLUCOSE 172* 101*  BUN 9 13  CREATININE 1.15* 1.22*  CALCIUM 8.3* 8.1*    PT/INR No results for input(s): "LABPROT", "INR" in the last 72 hours. ABG No results for input(s): "PHART", "HCO3" in the last 72 hours.  Invalid input(s): "PCO2", "PO2"  Studies/Results: No results found.  Anti-infectives: Anti-infectives (From admission, onward)    Start     Dose/Rate Route Frequency Ordered Stop   10/30/22 1115  ceFAZolin (ANCEF) IVPB 2g/100 mL premix        2 g 200 mL/hr over 30 Minutes Intravenous On call to O.R. 10/30/22 1109 10/30/22 1439       Assessment/Plan: Incarcerated umbilical hernia containing fat Abdominal pain Gallbladder dysfunction - reduced EF POD 3 s/p Diagnostic laparoscopy, Laparoscopic cholecystectomy, Primary repair of chronically incarcerated, omentum-containing umbilical hernia 2/35 - Dr. Kae Heller - findings of inflammatory adhesions. Biopsies taken and pending - expected post op  pain this am - multimodal pain control - n/v improved, advance to soft diet - on eliquis at baseline - last dose 1/22 am. Can likely resume PO anticoag today - encouraged IS use and mobilization today  FEN: SD, breeze ID: ancef periop VTE: heparin subq  Per primary Hyponatremia Hypothyroidism A fib s/p PPM - on eliquis baseline GERD Hyperlipidemia CKD stage IIIb   LOS: 6 days   Rosario Adie, [facility] 11/02/2022, 8:19 AM Please see Amion for pager number during day hours 7:00am-4:30pm

## 2022-11-03 ENCOUNTER — Encounter: Payer: Self-pay | Admitting: *Deleted

## 2022-11-03 ENCOUNTER — Other Ambulatory Visit: Payer: Self-pay | Admitting: *Deleted

## 2022-11-03 ENCOUNTER — Inpatient Hospital Stay (HOSPITAL_COMMUNITY): Payer: Medicare HMO

## 2022-11-03 DIAGNOSIS — J90 Pleural effusion, not elsewhere classified: Secondary | ICD-10-CM | POA: Diagnosis not present

## 2022-11-03 DIAGNOSIS — I700 Atherosclerosis of aorta: Secondary | ICD-10-CM | POA: Diagnosis not present

## 2022-11-03 DIAGNOSIS — J9811 Atelectasis: Secondary | ICD-10-CM | POA: Diagnosis not present

## 2022-11-03 DIAGNOSIS — R112 Nausea with vomiting, unspecified: Secondary | ICD-10-CM | POA: Diagnosis not present

## 2022-11-03 DIAGNOSIS — C189 Malignant neoplasm of colon, unspecified: Secondary | ICD-10-CM | POA: Diagnosis not present

## 2022-11-03 DIAGNOSIS — K460 Unspecified abdominal hernia with obstruction, without gangrene: Secondary | ICD-10-CM

## 2022-11-03 LAB — CBC
HCT: 31.7 % — ABNORMAL LOW (ref 36.0–46.0)
Hemoglobin: 10.2 g/dL — ABNORMAL LOW (ref 12.0–15.0)
MCH: 29 pg (ref 26.0–34.0)
MCHC: 32.2 g/dL (ref 30.0–36.0)
MCV: 90.1 fL (ref 80.0–100.0)
Platelets: 247 10*3/uL (ref 150–400)
RBC: 3.52 MIL/uL — ABNORMAL LOW (ref 3.87–5.11)
RDW: 13.6 % (ref 11.5–15.5)
WBC: 6.9 10*3/uL (ref 4.0–10.5)
nRBC: 0 % (ref 0.0–0.2)

## 2022-11-03 LAB — BASIC METABOLIC PANEL
Anion gap: 8 (ref 5–15)
BUN: 15 mg/dL (ref 8–23)
CO2: 21 mmol/L — ABNORMAL LOW (ref 22–32)
Calcium: 8.2 mg/dL — ABNORMAL LOW (ref 8.9–10.3)
Chloride: 101 mmol/L (ref 98–111)
Creatinine, Ser: 1.28 mg/dL — ABNORMAL HIGH (ref 0.44–1.00)
GFR, Estimated: 40 mL/min — ABNORMAL LOW (ref >60)
Glucose, Bld: 80 mg/dL (ref 70–99)
Potassium: 3.8 mmol/L (ref 3.5–5.1)
Sodium: 130 mmol/L — ABNORMAL LOW (ref 135–145)

## 2022-11-03 LAB — SURGICAL PATHOLOGY

## 2022-11-03 LAB — GLUCOSE, CAPILLARY: Glucose-Capillary: 82 mg/dL (ref 70–99)

## 2022-11-03 MED ORDER — SODIUM CHLORIDE (PF) 0.9 % IJ SOLN
INTRAMUSCULAR | Status: AC
  Filled 2022-11-03: qty 50

## 2022-11-03 MED ORDER — IOHEXOL 300 MG/ML  SOLN
60.0000 mL | Freq: Once | INTRAMUSCULAR | Status: AC | PRN
  Administered 2022-11-03: 60 mL via INTRAVENOUS

## 2022-11-03 MED ORDER — DOCUSATE SODIUM 100 MG PO CAPS: 100.0000 mg | ORAL_CAPSULE | Freq: Two times a day (BID) | ORAL | 0 refills | Status: DC

## 2022-11-03 NOTE — Progress Notes (Signed)
PT Cancellation Note  Patient Details Name: [name] MRN: [id number] DOB: [birth year]   Cancelled Treatment:    Reason Eval/Treat Not Completed: Patient declined, no reason specified Attempted to see pt for PT tx. Pt just returning from CT scan & pt & daughter declining participation in PT at this time. Pt anticipating d/c home soon.  Lavone Nian, PT, DPT 11/03/22, 10:56 AM   Waunita Schooner 11/03/2022, 10:55 AM

## 2022-11-03 NOTE — Progress Notes (Signed)
Received following message from Dr Zigmund Daniel, Inpatient MD: Can you follow-up on this sweet lady she is already discharged. 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 CT of the chest [place] [address], CA 19-9 was sent on the day of discharge.   Dr Marin Olp would like to see patient in about 2 weeks to allow for surgical healing. Called and spoke to patient's daughter and [place], [name].   Melissa lives near the [place] location and would rather have her mom's care there. Information passed on to Donella Stade, Nurse Navigator to follow up and schedule patient with [facility].   Oncology Nurse Navigator Documentation     11/03/2022    2:45 PM  Oncology Nurse Navigator Flowsheets  Abnormal Finding Date 10/27/2022  Confirmed Diagnosis Date 10/30/2022  Diagnosis Status Additional Work Up  Navigation Complete Date: 11/03/2022  Post Navigation: Continue to Follow Patient? No  Reason Not Navigating Patient: Seeking Care elsewhere  Navigator Location [facility]  Navigator Encounter Type Introductory Phone Call  Patient Visit Type MedOnc  Treatment Phase Pre-Tx/Tx Discussion  Barriers/Navigation Needs Coordination of Care;Education  Education Other  Interventions Coordination of Care;Education  Acuity Level 2-Minimal Needs (1-2 Barriers Identified)  Coordination of Care Appts  Education Method Verbal  Support Groups/Services Friends and Family  Time Spent with Patient 30

## 2022-11-03 NOTE — Progress Notes (Signed)
Patient medication regimen, discharge information, and transportation for discharge explained to daughter. All questions answered regarding post hospital follow-up appointments for  new diagnosis. Daughter explained that she will be seeking new PCP for post hospital follow-up. Patient safely escorted safely for discharge at main entrance via wheelchair

## 2022-11-03 NOTE — Progress Notes (Signed)
Progress Note  4 Days Post-Op  Subjective: Pt tolerating diet and having bowel function. Abdominal pain with mobilization and coughing, but mild overall. Discussed post-operative instructions and follow up.   Notified by pathology of preliminary findings of adenocarcinoma on biopsies sent. Patient denies personal hx of malignancy. Per daughter, patient's mother may have had lung cancer but unsure. Patient reports smoking cigarettes at age 89 but not often or for very long. No significant known environmental exposures or radiation. She reports she is no longer getting colonoscopies at her age but does not remember any remarkable on past scopes. Was getting annual mammograms but has not in the last 2-3 years. Patient has had a hysterectomy but does still have ovaries. Long discussion with patient and daughter about findings and workup going forward.   Objective: Vital signs in last 24 hours: Temp:  [98.5 F (36.9 C)-98.6 F (37 C)] 98.5 F (36.9 C) (01/29 0524) Pulse Rate:  [59-65] 59 (01/29 0524) Resp:  [18-20] 20 (01/29 0524) BP: (122-148)/(53-94) 148/53 (01/29 0524) SpO2:  [94 %-99 %] 95 % (01/29 0851) Weight:  [95.4 kg] 95.4 kg (01/29 0500) Last BM Date : 11/02/22  Intake/Output from previous day: 01/28 0701 - 01/29 0700 In: 540 [P.O.:540] Out: 5250 [Urine:5250] Intake/Output this shift: Total I/O In: 240 [P.O.:240] Out: -   PE: General: pleasant, WD, elderly female who is laying in bed in NAD Heart: regular, rate, and rhythm.  Lungs: CTAB, no wheezes, rhonchi, or rales noted.  Respiratory effort nonlabored Abd: soft, appropriately ttp, incisions C/D/I, ND, +BS Psych: A&Ox3 with an appropriate affect.    Lab Results:  Recent Labs    11/02/22 0810 11/03/22 0358  WBC 7.1 6.9  HGB 10.1* 10.2*  HCT 34.1* 31.7*  PLT 218 247   BMET Recent Labs    11/02/22 0810 11/03/22 0358  NA 133* 130*  K 4.3 3.8  CL 109 101  CO2 16* 21*  GLUCOSE 83 80  BUN 11 15   CREATININE 1.07* 1.28*  CALCIUM 8.0* 8.2*   PT/INR No results for input(s): "LABPROT", "INR" in the last 72 hours. CMP     Component Value Date/Time   NA 130 (L) 11/03/2022 0358   NA 137 06/20/2022 1000   K 3.8 11/03/2022 0358   CL 101 11/03/2022 0358   CO2 21 (L) 11/03/2022 0358   GLUCOSE 80 11/03/2022 0358   BUN 15 11/03/2022 0358   BUN 20 06/20/2022 1000   CREATININE 1.28 (H) 11/03/2022 0358   CALCIUM 8.2 (L) 11/03/2022 0358   PROT 5.0 (L) 11/01/2022 0808   ALBUMIN 2.5 (L) 11/01/2022 0808   AST 17 11/01/2022 0808   ALT 12 11/01/2022 0808   ALKPHOS 59 11/01/2022 0808   BILITOT 0.2 (L) 11/01/2022 0808   GFRNONAA 40 (L) 11/03/2022 0358   Lipase     Component Value Date/Time   LIPASE 27 10/27/2022 1420       Studies/Results: DG CHEST PORT 1 VIEW  Result Date: 11/02/2022 CLINICAL DATA:  Shortness of breath, history of incarcerated umbilical hernia EXAM: PORTABLE CHEST 1 VIEW COMPARISON:  05/28/2022 FINDINGS: Cardiac shadow is mildly enlarged. Pacing device is seen. No focal infiltrate or effusion is seen. Chronic blunting of the left costophrenic angle is noted. No bony abnormality is seen. IMPRESSION: No acute abnormality noted. Electronically Signed   By: Inez Catalina M.D.   On: 11/02/2022 17:21    Anti-infectives: Anti-infectives (From admission, onward)    Start     Dose/Rate Route  Frequency Ordered Stop   10/30/22 1115  ceFAZolin (ANCEF) IVPB 2g/100 mL premix        2 g 200 mL/hr over 30 Minutes Intravenous On call to O.R. 10/30/22 1109 10/30/22 1439        Assessment/Plan  Incarcerated umbilical hernia containing fat Abdominal pain Gallbladder dysfunction - reduced EF POD4 s/p Diagnostic laparoscopy, Laparoscopic cholecystectomy, Primary repair of chronically incarcerated, omentum-containing umbilical hernia 2/99 - Dr. Kae Heller - findings of inflammatory adhesions. Biopsies taken and positive for moderately differentiated adenocarcinoma on prelim but  unclear etiology. Stains pending. Ordered CEA, CA19-9 and CA 125. No concern for malignancy on abdominal imaging. Ordered chest CT as well - expected post op pain this am - multimodal pain control - tolerating soft diet - eliquis resumed 1/28 - patient otherwise stable for discharge from a surgical standpoint after CT and labs drawn. Notified hospitalist of pathology findings and recommend seeing if ONC would want to see patient here or just follow up outpatient once source of adenocarcinoma identified.    FEN: SD, breeze ID: ancef periop VTE: heparin subq   Per primary Hyponatremia Hypothyroidism A fib s/p PPM - on eliquis baseline GERD Hyperlipidemia CKD stage IIIb   LOS: 7 days    Norm Parcel, [facility] 11/03/2022, 10:26 AM Please see Amion for pager number during day hours 7:00am-4:30pm

## 2022-11-03 NOTE — Progress Notes (Signed)
PATIENT NAVIGATOR PROGRESS NOTE  Name: [name] Date: 11/03/2022 MRN: [id number]  DOB: 13-Jan-1931   Reason for visit:  New Patient appt  Comments:  Called and spoke with Ms Durene Romans, Ms [facility] daughter and scheduled her for New Patient appt with Dr Benay Spice on 11/17/22 at 1:40pm. Directions to building and parking reviewed with Ms Durene Romans, as well as one support person allowed in the appt. Gave contact information to call with any questions or issues    Time spent counseling/coordinating care: 45-60 minutes

## 2022-11-03 NOTE — Discharge Instructions (Signed)
[facility] _______[facility]  UMBILICAL OR INGUINAL HERNIA REPAIR: POST OP INSTRUCTIONS  Always review your discharge instruction sheet given to you by the facility where your surgery was performed. IF YOU HAVE DISABILITY OR FAMILY LEAVE FORMS, YOU MUST BRING THEM TO THE OFFICE FOR PROCESSING.   DO NOT GIVE THEM TO YOUR DOCTOR.  1. A  prescription for pain medication may be given to you upon discharge.  Take your pain medication as prescribed, if needed.  If narcotic pain medicine is not needed, then you may take acetaminophen (Tylenol) or ibuprofen (Advil) as needed. 2. Take your usually prescribed medications unless otherwise directed. If you need a refill on your pain medication, please contact your pharmacy.  They will contact our office to request authorization. Prescriptions will not be filled after 5 pm or on week-ends. 3. You should follow a light diet the first 24 hours after arrival home, such as soup and crackers, etc.  Be sure to include lots of fluids daily.  Resume your normal diet the day after surgery. 4.Most patients will experience some swelling and bruising around the umbilicus or in the groin and scrotum.  Ice packs and reclining will help.  Swelling and bruising can take several days to resolve.  6. It is common to experience some constipation if taking pain medication after surgery.  Increasing fluid intake and taking a stool softener (such as Colace) will usually help or prevent this problem from occurring.  A mild laxative (Milk of Magnesia or Miralax) should be taken according to package directions if there are no bowel movements after 48 hours. 7. Unless discharge instructions indicate otherwise, you may remove your bandages 24-48 hours after surgery, and you may shower at that time.  You may have steri-strips (small skin tapes) in place directly over the incision.  These strips should be left on the skin for 7-10 days.  If your surgeon used skin glue on the  incision, you may shower in 24 hours.  The glue will flake off over the next 2-3 weeks.  Any sutures or staples will be removed at the office during your follow-up visit. 8. ACTIVITIES:  You may resume regular (light) daily activities beginning the next day--such as daily self-care, walking, climbing stairs--gradually increasing activities as tolerated.  You may have sexual intercourse when it is comfortable.  Refrain from any heavy lifting or straining until approved by your doctor.  a.You may drive when you are no longer taking prescription pain medication, you can comfortably wear a seatbelt, and you can safely maneuver your car and apply brakes.   9.You should see your doctor in the office for a follow-up appointment approximately 2-3 weeks after your surgery.  Make sure that you call for this appointment within a day or two after you arrive home to insure a convenient appointment time.   WHEN TO CALL YOUR DOCTOR: Fever over 101.0 Inability to urinate Nausea and/or vomiting Extreme swelling or bruising Continued bleeding from incision. Increased pain, redness, or drainage from the incision  The clinic staff is available to answer your questions during regular business hours.  Please don't hesitate to call and ask to speak to one of the nurses for clinical concerns.  If you have a medical emergency, go to the nearest emergency room or call 911.  A surgeon from [facility] is always on call at the hospital   [address] [place], [place], [place]  [address] ?  [address] [address], [place], [place]  [address] [phone] ? [phone] ? FAX [phone] Web site: [email or web address]

## 2022-11-03 NOTE — Discharge Summary (Signed)
Physician Discharge Summary  [name] EQA:[id number] DOB: 1931-03-19 DOA: 10/27/2022  PCP: Merrilee Seashore, MD  Admit date: 10/27/2022 Discharge date: 11/03/2022  Admitted From:  home Disposition: Home   Recommendations for Outpatient Follow-up:  Follow up with PCP in 1-2 weeks Please obtain BMP/CBC in one week Please follow up with general surgery Home Health: Yes Equipment/Devices:none  Discharge Condition:stable CODE STATUS:dnr Diet recommendation:cardiac Brief/Interim Summary:  [name] is a 89 y.o. female with medical history significant of chronic combined syst and diastolic CHF, HTN, CKD stage III, Afib s/p PPM, GERD, hx of incarcerated umbilical hernia who was to have repair of hernia by general surgery, Dr. Kae Heller on Friday, 10/31/2022 who had presented to preop testing and noted to have had intractable nausea vomiting and worsening periumbilical and right lower quadrant abdominal pain over the past 2 to 3 days with some associated shortness of breath, diarrhea, lightheadedness, right flank pain in addition.  Patient denies any fevers, no chills, no chest pain, no constipation, no melena, no hematemesis, no hematochezia.  Patient seen at preop testing, call was made to general surgery's office and patient advised to present to the ED.  Patient noted to have been seen in the ED on 10/07/2022 with similar symptoms.   ED Course: Patient seen in the ED, comprehensive metabolic profile obtained with a sodium of 127, potassium of 5.3, chloride of 96, glucose of 102, creatinine of 1.35, albumin of 3.4 otherwise within normal limits.  Lactic acid level noted at 1.3.  CBC unremarkable.  Repeat CT abdomen and pelvis done with moderate fat-containing periumbilical hernia is not substantially changed with persistent increased density, favored to reflect marked fat stranding rather than underlying soft tissue lesion.  Slightly increased small volume free fluid and mesenteric  stranding nonspecific may be reactive, small hiatal hernia, colonic diverticulosis without acute diverticulitis, aortic atherosclerosis.  ED PA spoke with general surgery who had recommended medical admission and general surgery will consult in the morning. Discharge Diagnoses:  Principal Problem:   Intractable nausea and vomiting Active Problems:   Incarcerated hernia   Atrial fibrillation with RVR (HCC)   Hyponatremia   Essential hypertension   Hypothyroidism   HLD (hyperlipidemia)   Chronic combined systolic and diastolic heart failure (HCC)   Dehydration   Hyperkalemia   Umbilical hernia without obstruction or gangrene   #1 intractable nausea and vomiting likely secondary to incarcerated umbilical hernia gallbladder dysfunction -Patient presented with intractable nausea vomiting periumbilical pain and right lower quadrant pain worsening over the past 2 to 3 days with inability to keep anything down. -CT abdomen and pelvis done with moderate fat-containing periumbilical hernia is not substantially changed with persistent increased density, favored to reflect marked fat stranding rather than underlying soft tissue lesion.patient had diagnostic laparoscopy and cholecystectomy with umbilical hernia repair.   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  Chest CT was done prior to discharge with no evidence of metastatic disease, mild focal groundglass opacity in the left upper lobe which needs to be followed on bilateral pleural effusions and bibasilar atelectasis.  CA 19-9 and CEA 20 CEA was sent out prior to discharge. Patient to follow-up with Dr. Marin Olp.  Discussed with patient's daughter and Dr. Marin Olp. 2.  Hyponatremia -due to hypervolemia stable 3.  Hyperkalemia resolved -Likely secondary to spironolactone, Entresto. -4.  Hypothyroidism Placed on home regimen Synthroid '1 1 2 '$ mcg daily. 5.   A-fib status  post PPM continue amiodarone and Toprol. Continue Eliquis. 6.  GERD-continue home meds. 7.  Hyperlipidemia-continue home meds.  8.  CKD stage IIIb-  stable  9 Diastolic heart failure-ef 55% continue lasix  Pressure Injury 12/18/21 Sacrum Mid Stage 2 -  Partial thickness loss of dermis presenting as a shallow open injury with a red, pink wound bed without slough. prior moisture associated skin damage now open (Active)  12/18/21 1900  Location: Sacrum  Location Orientation: Mid  Staging: Stage 2 -  Partial thickness loss of dermis presenting as a shallow open injury with a red, pink wound bed without slough.  Wound Description (Comments): prior moisture associated skin damage now open  Present on Admission: No     Pressure Injury 10/28/22 Buttocks Mid Stage 1 -  Intact skin with non-blanchable redness of a localized area usually over a bony prominence. (Active)  10/28/22 0800  Location: Buttocks  Location Orientation: Mid  Staging: Stage 1 -  Intact skin with non-blanchable redness of a localized area usually over a bony prominence.  Wound Description (Comments):   Present on Admission: Yes  Dressing Type Foam - Lift dressing to assess site every shift 11/03/22 2841   Nutrition Problem: Inadequate oral intake Etiology: acute illness, decreased appetite, nausea, vomiting  Signs/Symptoms: per patient/family report  Interventions: Refer to RD note for recommendations, Boost Breeze  Estimated body mass index is 33.95 kg/m as calculated from the following:   Height as of this encounter: '5\' 6"'$  (1.676 m).   Weight as of this encounter: 95.4 kg.  Discharge Instructions  Discharge Instructions     Diet - low sodium heart healthy   Complete by: As directed    Discharge wound care:   Complete by: As directed    See orders   Increase activity slowly   Complete by: As directed       Allergies as of 11/03/2022       Reactions   Atropine Other (See Comments)   Made  her entire body jump after they gave it to her.   Miralax [polyethylene Glycol] Other (See Comments)   17g dose caused stomach troubles for 3 days    Prozac [fluoxetine] Other (See Comments)   Hyponatremia    Serotonin Reuptake Inhibitors (ssris) Other (See Comments)   Hyponatremia    Keflex [cephalexin] Other (See Comments)   Unknown reaction Tolerated Cefdinir 05/2022        Medication List     STOP taking these medications    HYDROcodone-acetaminophen 5-325 MG tablet Commonly known as: Norco   ondansetron 4 MG tablet Commonly known as: ZOFRAN       TAKE these medications    acetaminophen 500 MG tablet Commonly known as: TYLENOL Take 1,000 mg by mouth every 6 (six) hours as needed for mild pain.   amiodarone 200 MG tablet Commonly known as: PACERONE Take 1 tablet (200 mg total) by mouth daily.   apixaban 5 MG Tabs tablet Commonly known as: Eliquis Take 1 tablet (5 mg total) by mouth 2 (two) times daily.   benzonatate 100 MG capsule Commonly known as: Tessalon Perles Take 1 capsule (100 mg total) by mouth 3 (three) times daily as needed for cough.   carboxymethylcellulose 0.5 % Soln Commonly known as: REFRESH PLUS Place 1 drop into both eyes daily as needed (dry eyes).   DELSYM PO Take 10 mLs by mouth 2 (two) times daily as needed (cough).   docusate sodium 100 MG capsule Commonly known as: COLACE  Take 1 capsule (100 mg total) by mouth 2 (two) times daily.   Entresto 24-26 MG Generic drug: sacubitril-valsartan Take 1 tablet by mouth 2 (two) times daily.   Eye Vitamins Caps Take 1 capsule by mouth daily with lunch.   famotidine 20 MG tablet Commonly known as: PEPCID Take 1 tablet (20 mg total) by mouth at bedtime.   fluticasone 50 MCG/ACT nasal spray Commonly known as: FLONASE Place 1 spray into both nostrils daily.   fluticasone-salmeterol 100-50 MCG/ACT Aepb Commonly known as: Wixela Inhub Inhale 1 puff into the lungs 2 (two) times daily.    furosemide 20 MG tablet Commonly known as: LASIX Take '40mg'$  (2 tablets) daily. May take additional '20mg'$  (1 tablet) as needed for weight gain of 2 pounds overnight or 5 pounds in one week What changed:  how much to take how to take this when to take this additional instructions   ipratropium 0.06 % nasal spray Commonly known as: ATROVENT Place 1 spray into both nostrils 2 (two) times daily.   ketoconazole 2 % cream Commonly known as: NIZORAL Apply 1 Application topically 2 (two) times daily as needed for rash.   levothyroxine 200 MCG tablet Commonly known as: SYNTHROID Take 200 mcg by mouth daily. What changed: Another medication with the same name was removed. Continue taking this medication, and follow the directions you see here.   levothyroxine 25 MCG tablet Commonly known as: SYNTHROID Take 25 mcg by mouth daily. What changed: Another medication with the same name was removed. Continue taking this medication, and follow the directions you see here.   LORazepam 0.5 MG tablet Commonly known as: ATIVAN Take 0.5 mg by mouth at bedtime.   lovastatin 40 MG tablet Commonly known as: MEVACOR Take 40 mg by mouth daily with supper.   metoprolol succinate 25 MG 24 hr tablet Commonly known as: TOPROL-XL Take 0.5 tablets (12.5 mg total) by mouth daily.   omeprazole 10 MG capsule Commonly known as: PRILOSEC Take 10 mg by mouth every morning.   polyethylene glycol 17 g packet Commonly known as: MiraLax Take 17 g by mouth daily.   potassium chloride SA 20 MEQ tablet Commonly known as: KLOR-CON M Take 1 tablet (20 mEq total) by mouth daily.   spironolactone 25 MG tablet Commonly known as: ALDACTONE Take 0.5 tablets (12.5 mg total) by mouth daily.   THIAMINE PO Take 1 tablet by mouth daily with lunch. Vitamin B-1, unknown strength.   triamcinolone cream 0.1 % Commonly known as: KENALOG Apply 1 Application topically 2 (two) times daily as needed (flare ups).   VITAMIN  B-12 PO Take 1 tablet by mouth daily with lunch.   VITAMIN D-3 PO Take 1 capsule by mouth daily with lunch.               Discharge Care Instructions  (From admission, onward)           Start     Ordered   11/03/22 0000  Discharge wound care:       Comments: See orders   11/03/22 1026            Follow-up Information     Clovis Riley, MD. Go on 11/14/2022.   Specialty: General Surgery Why: 9:10 AM, please arrive 30 min prior to appointment time. Have ID and insurance information with you. Contact information: [address] [place] [address] [phone]         Merrilee Seashore, MD Follow up.  Specialty: Internal Medicine Contact information: [place] [address] [phone]                Allergies  Allergen Reactions   Atropine Other (See Comments)    Made her entire body jump after they gave it to her.   Miralax [Polyethylene Glycol] Other (See Comments)    17g dose caused stomach troubles for 3 days    Prozac [Fluoxetine] Other (See Comments)    Hyponatremia    Serotonin Reuptake Inhibitors (Ssris) Other (See Comments)    Hyponatremia    Keflex [Cephalexin] Other (See Comments)    Unknown reaction Tolerated Cefdinir 05/2022    Consultations: General surgery   Procedures/Studies: CT CHEST W CONTRAST  Result Date: 11/03/2022 CLINICAL DATA:  Colon cancer staging; * Tracking Code: BO * EXAM: CT CHEST WITH CONTRAST TECHNIQUE: Multidetector CT imaging of the chest was performed during intravenous contrast administration. RADIATION DOSE REDUCTION: This exam was performed according to the departmental dose-optimization program which includes automated exposure control, adjustment of the mA and/or kV according to patient size and/or use of iterative reconstruction technique. CONTRAST:  63m OMNIPAQUE IOHEXOL 300 MG/ML  SOLN COMPARISON:  CT abdomen and pelvis dated October 27, 2022  FINDINGS: Cardiovascular: Normal heart size. No pericardial effusion. Pericardial recess fluid normal caliber thoracic aorta with moderate calcified plaque. Severe coronary artery calcifications. Chest wall dual lead pacer with leads in the right atrium and right ventricle. Mediastinum/Nodes: Small hiatal hernia. Thyroid is unremarkable. No pathologically enlarged lymph nodes seen in the chest. Lungs/Pleura: Central airways are patent. Mild focal ground-glass opacity of the left upper lobe seen on series 5, image 66. No consolidation, pleural effusion or pneumothorax. Small bilateral pleural effusions and bibasilar atelectasis. Upper Abdomen: Interval cholecystectomy. Musculoskeletal: No aggressive appearing osseous lesions. Scattered locules of gas are seen in the anterior lower chest/upper abdominal wall, likely postoperative. IMPRESSION: 1. No evidence of metastatic disease in the chest. 2. Mild focal ground-glass opacity of the left upper lobe, likely infectious or inflammatory. Recommend attention on follow-up. 3. Small bilateral pleural effusions and bibasilar atelectasis. 4. Severe coronary artery calcificans and aortic Atherosclerosis (ICD10-I70.0). Electronically Signed   By: LYetta GlassmanM.D.   On: 11/03/2022 11:10   DG CHEST PORT 1 VIEW  Result Date: 11/02/2022 CLINICAL DATA:  Shortness of breath, history of incarcerated umbilical hernia EXAM: PORTABLE CHEST 1 VIEW COMPARISON:  05/28/2022 FINDINGS: Cardiac shadow is mildly enlarged. Pacing device is seen. No focal infiltrate or effusion is seen. Chronic blunting of the left costophrenic angle is noted. No bony abnormality is seen. IMPRESSION: No acute abnormality noted. Electronically Signed   By: MInez CatalinaM.D.   On: 11/02/2022 17:21   NM Hepato W/EF  Result Date: 10/28/2022 CLINICAL DATA:  Abdominal pain with nausea and vomiting EXAM: NUCLEAR MEDICINE HEPATOBILIARY IMAGING WITH GALLBLADDER EF TECHNIQUE: Sequential images of the abdomen  were obtained out to 60 minutes following intravenous administration of radiopharmaceutical. After oral ingestion of Ensure, gallbladder ejection fraction was determined. At 60 min, normal ejection fraction is greater than 33%. RADIOPHARMACEUTICALS:  5.2 mCi Tc-980mCholetec IV COMPARISON:  CT 10/27/2022 FINDINGS: Prompt uptake and biliary excretion of activity by the liver is seen. Gallbladder activity is visualized, consistent with patency of cystic duct. Biliary activity passes into small bowel, consistent with patent common bile duct. Calculated gallbladder ejection fraction is 29%. (Normal gallbladder ejection fraction with Ensure is greater than 33%.) IMPRESSION: 1. Patency of the cystic duct and common  bile duct without scintigraphic evidence of cholecystitis. 2. Gallbladder dysfunction with a decreased ejection fraction of 29%. Electronically Signed   By: Davina Poke D.O.   On: 10/28/2022 14:28   US Abdomen Limited RUQ (LIVER/GB)  Result Date: 10/28/2022 CLINICAL DATA:  Cholecystitis EXAM: ULTRASOUND ABDOMEN LIMITED RIGHT UPPER QUADRANT COMPARISON:  CT 10/27/2022 FINDINGS: Gallbladder: The gallbladder is mildly distended without wall thickening or pericholecystic fluid. Negative sonographic Murphy sign. No gallstones identified. Common bile duct: Diameter: 3.0 mm, normal.  No intrahepatic ductal dilation. Liver: No focal lesion identified. Within normal limits in parenchymal echogenicity. Portal vein is patent on color Doppler imaging with normal direction of blood flow towards the liver. Other: Limited exam due to body habitus and bowel gas. IMPRESSION: No evidence of acute cholecystitis or biliary obstruction. Electronically Signed   By: Maurine Simmering M.D.   On: 10/28/2022 11:59   CT ABDOMEN PELVIS W CONTRAST  Result Date: 10/27/2022 CLINICAL DATA:  Increased periumbilical pain, nausea, and vomiting in the setting of known hernia EXAM: CT ABDOMEN AND PELVIS WITH CONTRAST TECHNIQUE: Multidetector  CT imaging of the abdomen and pelvis was performed using the standard protocol following bolus administration of intravenous contrast. RADIATION DOSE REDUCTION: This exam was performed according to the departmental dose-optimization program which includes automated exposure control, adjustment of the mA and/or kV according to patient size and/or use of iterative reconstruction technique. CONTRAST:  166m OMNIPAQUE IOHEXOL 300 MG/ML  SOLN COMPARISON:  CT abdomen and pelvis dated 10/07/2022 FINDINGS: Lower chest: No focal consolidation or pulmonary nodule in the lung bases. No pleural effusion or pneumothorax demonstrated. Partially imaged heart size is normal. Partially imaged pacemaker leads terminate in the right atrium and ventricle. Hepatobiliary: Subcentimeter hypodensity in segment 4 (2:22), unchanged, is too small to characterize. No intra or extrahepatic biliary ductal dilation. Normal gallbladder. Pancreas: No focal lesions or main ductal dilation. Spleen: Normal in size without focal abnormality. Adrenals/Urinary Tract: No adrenal nodules. No suspicious renal mass, calculi or hydronephrosis. Unchanged simple fluid attenuation focus adjacent to the right kidney measuring 5.2 cm (2:39). No focal bladder wall thickening. Stomach/Bowel: Small hiatal hernia. Normal appearance of the stomach. No evidence of bowel wall thickening, distention, or inflammatory changes. Colonic diverticulosis without acute diverticulitis. Appendectomy. Vascular/Lymphatic: Aortic atherosclerosis. Circumaortic left renal vein. No enlarged abdominal or pelvic lymph nodes. Reproductive: No adnexal masses. Other: Slightly increased small volume free fluid and mesenteric stranding. Free air. Moderate fat-containing paraumbilical hernia is not substantially changed with persistent increased density. Musculoskeletal: No acute or abnormal lytic or blastic osseous lesions. IMPRESSION: 1. Moderate fat-containing paraumbilical hernia is not  substantially changed with persistent increased density, favored to reflect marked fat stranding rather than underlying soft tissue lesion. 2. Slightly increased small volume free fluid and mesenteric stranding, nonspecific, may be reactive. 3. Small hiatal hernia. 4. Colonic diverticulosis without acute diverticulitis. 5.  Aortic Atherosclerosis (ICD10-I70.0). Electronically Signed   By: LDarrin NipperM.D.   On: 10/27/2022 15:51   ECHOCARDIOGRAM COMPLETE  Result Date: 10/14/2022    ECHOCARDIOGRAM REPORT   [facility] Name[facility]   [facility]: [facility]/2022 Medical Rec #:  [id number]        Height:       66.0 in Accession #:    [id number]       Weight:       195.1 lb Date of Birth:  [birth year]       BSA:          1.979  m Patient Age:    43 years          BP:           110/60 mmHg Patient Gender: F                 HR:           98 bpm. Exam Location:  Outpatient Procedure: 2D Echo, 3D Echo, Cardiac Doppler, Color Doppler and Strain Analysis Indications:    I48.91* Unspeicified atrial fibrillation  History:        Patient has prior history of Echocardiogram examinations, most                 recent 12/09/2021. Pacemaker, Arrythmias:Atrial Fibrillation; Risk                 Factors:Hypertension, Non-Smoker and Dyslipidemia.  Sonographer:    Leavy Cella RDCS Referring Phys: [id number] TIFFANY Caro IMPRESSIONS  1. Left ventricular ejection fraction, by estimation, is 50 to 55%. The left ventricle has low normal function. The left ventricle has no regional wall motion abnormalities. There is mild left ventricular hypertrophy. Left ventricular diastolic parameters are consistent with Grade I diastolic dysfunction (impaired relaxation). Elevated left ventricular end-diastolic pressure.  2. Right ventricular systolic function is normal. The right ventricular size is normal. There is normal pulmonary artery systolic pressure.  3. The mitral valve is normal in structure. No evidence of mitral valve regurgitation. No  evidence of mitral stenosis.  4. The aortic valve is tricuspid. There is mild calcification of the aortic valve. There is mild thickening of the aortic valve. Aortic valve regurgitation is mild to moderate. No aortic stenosis is present. Aortic regurgitation PHT measures 425 msec.  5. Aortic dilatation noted. There is mild dilatation of the ascending aorta, measuring 42 mm.  6. The inferior vena cava is normal in size with greater than 50% respiratory variability, suggesting right atrial pressure of 3 mmHg. FINDINGS  Left Ventricle: Left ventricular ejection fraction, by estimation, is 50 to 55%. The left ventricle has low normal function. The left ventricle has no regional wall motion abnormalities. The left ventricular internal cavity size was normal in size. There is mild left ventricular hypertrophy. Left ventricular diastolic parameters are consistent with Grade I diastolic dysfunction (impaired relaxation). Elevated left ventricular end-diastolic pressure. Right Ventricle: The right ventricular size is normal. No increase in right ventricular wall thickness. Right ventricular systolic function is normal. There is normal pulmonary artery systolic pressure. The tricuspid regurgitant velocity is 2.28 m/s, and  with an assumed right atrial pressure of 3 mmHg, the estimated right ventricular systolic pressure is 84.1 mmHg. Left Atrium: Left atrial size was normal in size. Right Atrium: Right atrial size was normal in size. Pericardium: There is no evidence of pericardial effusion. Mitral Valve: The mitral valve is normal in structure. No evidence of mitral valve regurgitation. No evidence of mitral valve stenosis. Tricuspid Valve: The tricuspid valve is normal in structure. Tricuspid valve regurgitation is mild . No evidence of tricuspid stenosis. Aortic Valve: The aortic valve is tricuspid. There is mild calcification of the aortic valve. There is mild thickening of the aortic valve. Aortic valve regurgitation is  mild to moderate. Aortic regurgitation PHT measures 425 msec. No aortic stenosis is present. Pulmonic Valve: The pulmonic valve was normal in structure. Pulmonic valve regurgitation is not visualized. No evidence of pulmonic stenosis. Aorta: Aortic dilatation noted. There is mild dilatation of the ascending aorta, measuring 42 mm. Venous: The inferior vena  cava is normal in size with greater than 50% respiratory variability, suggesting right atrial pressure of 3 mmHg. IAS/Shunts: No atrial level shunt detected by color flow Doppler.  LEFT VENTRICLE PLAX 2D LVIDd:         4.43 cm   Diastology LVIDs:         3.40 cm   LV e' medial:    4.24 cm/s LV PW:         1.34 cm   LV E/e' medial:  15.9 LV IVS:        1.20 cm   LV e' lateral:   5.55 cm/s LVOT diam:     1.80 cm   LV E/e' lateral: 12.2 LV SV:         43 LV SV Index:   22 LVOT Area:     2.54 cm  RIGHT VENTRICLE RV Basal diam:  4.20 cm RV Mid diam:    3.38 cm TAPSE (M-mode): 1.2 cm LEFT ATRIUM             Index        RIGHT ATRIUM           Index LA diam:        3.20 cm 1.62 cm/m   RA Area:     14.60 cm LA Vol (A2C):   51.3 ml 25.92 ml/m  RA Volume:   41.10 ml  20.77 ml/m LA Vol (A4C):   46.3 ml 23.40 ml/m LA Biplane Vol: 48.6 ml 24.56 ml/m  AORTIC VALVE LVOT Vmax:   77.60 cm/s LVOT Vmean:  49.800 cm/s LVOT VTI:    0.168 m AI PHT:      425 msec  AORTA Ao Root diam: 3.50 cm Ao Asc diam:  4.20 cm MITRAL VALVE               TRICUSPID VALVE MV Area (PHT): 3.21 cm    TR Peak grad:   20.8 mmHg MV Decel Time: 236 msec    TR Vmax:        228.00 cm/s MV E velocity: 67.60 cm/s MV A velocity: 80.70 cm/s  SHUNTS MV E/A ratio:  0.84        Systemic VTI:  0.17 m                            Systemic Diam: 1.80 cm [name] MD Electronically signed by [name] MD Signature Date/Time: 10/14/2022/3:23:03 PM    Final    CT ABDOMEN PELVIS W CONTRAST  Result Date: 10/07/2022 CLINICAL DATA:  Abdominal pain, acute, nonlocalized. Endorses RLQ ABD that spans across to  the LLQ. This began a few days ago. Normally has Loose Bowels but has recently been more loose and watery. Some nausea. No Emesis or Fevers Hx of GERD, hysterectomy EXAM: CT ABDOMEN AND PELVIS WITH CONTRAST TECHNIQUE: Multidetector CT imaging of the abdomen and pelvis was performed using the standard protocol following bolus administration of intravenous contrast. RADIATION DOSE REDUCTION: This exam was performed according to the departmental dose-optimization program which includes automated exposure control, adjustment of the mA and/or kV according to patient size and/or use of iterative reconstruction technique. CONTRAST:  42m OMNIPAQUE IOHEXOL 300 MG/ML  SOLN COMPARISON:  None Available. FINDINGS: Lower chest: No acute abnormality. Cardiac leads noted. Coronary artery calcification. Tiny hiatal hernia. Hepatobiliary: Subcentimeter hypodensity too small to characterize. Otherwise no focal liver abnormality. No gallstones, gallbladder wall thickening, or pericholecystic fluid. No  biliary dilatation. Pancreas: Diffusely atrophic. No focal lesion. Otherwise normal pancreatic contour. No surrounding inflammatory changes. No main pancreatic ductal dilatation. Spleen: Normal in size without focal abnormality. Adrenals/Urinary Tract: No adrenal nodule bilaterally. Bilateral kidneys enhance symmetrically. Exophytic 5.3 cm right fluid density lesion likely represents a simple renal cysts. Simple renal cysts, in the absence of clinically indicated signs/symptoms, require no independent follow-up. Subcentimeter hypodensities are too small to characterize-no further follow-up indicated. No hydronephrosis. No hydroureter.  No nephroureterolithiasis. The urinary bladder is unremarkable. No excretion of intravenous contrast from the kidneys on delayed imaging. Stomach/Bowel: Stomach is within normal limits. No evidence of bowel wall thickening or dilatation. Colonic diverticulosis. The appendix is not definitely identified  with no inflammatory changes in the right lower quadrant to suggest acute appendicitis. Vascular/Lymphatic: No abdominal aorta or iliac aneurysm. Moderate atherosclerotic plaque of the aorta and its branches. No abdominal, pelvic, or inguinal lymphadenopathy. Reproductive: Status post hysterectomy. No adnexal masses. Other: No intraperitoneal free fluid. No intraperitoneal free gas. No organized fluid collection. Nonspecific scattered misty mesentery of the small bowel mesentery (2:52, 57, 59). Musculoskeletal: Small to moderate volume umbilical hernia containing fat with an abdominal defect of 2.4 x 2.5 cm. The contained fat demonstrates marked fat stranding and query associated soft tissue density. No suspicious lytic or blastic osseous lesions. No acute displaced fracture. Multilevel degenerative changes of the spine. IMPRESSION: 1. Small to moderate volume fat containing umbilical hernia with the abdominal defect of 2.5 cm. Associated stranding of the contained fat in query associated soft tissue density. Correlate clinically for incarceration. Underlying mass lesion is not excluded. 2. Nonspecific scattered misty mesentery of the small bowel mesentery. Correlate clinically for prior malignancy given history of hysterectomy. Recommend attention on follow-up. 3. No excretion of intravenous contrast from the kidneys on delayed imaging. Correlate with renal function. 4. Colonic diverticulosis with no acute diverticulitis. 5.  Aortic Atherosclerosis (ICD10-I70.0). 6. The appendix is not definitely identified with no inflammatory changes in the right lower quadrant to suggest acute appendicitis. Electronically Signed   By: Iven Finn M.D.   On: 10/07/2022 17:15   (Echo, Carotid, EGD, Colonoscopy, ERCP)    Subjective: Patient resting in bed anxious to go home No new complaints breathing better off of oxygen  Discharge Exam: Vitals:   11/03/22 0524 11/03/22 0851  BP: (!) 148/53   Pulse: (!) 59   Resp:  20   Temp: 98.5 F (36.9 C)   SpO2: 94% 95%   Vitals:   11/02/22 2148 11/03/22 0500 11/03/22 0524 11/03/22 0851  BP: (!) 126/53  (!) 148/53   Pulse:   (!) 59   Resp:   20   Temp:   98.5 F (36.9 C)   TempSrc:   Oral   SpO2:   94% 95%  Weight:  95.4 kg    Height:        General: Pt is alert, awake, not in acute distress Cardiovascular: RRR, S1/S2 +, no rubs, no gallops Respiratory: CTA bilaterally, no wheezing, no rhonchi Abdominal: Soft, appropriately tender, ND, bowel sounds + Extremities: no edema, no cyanosis    The results of significant diagnostics from this hospitalization (including imaging, microbiology, ancillary and laboratory) are listed below for reference.     Microbiology: Recent Results (from the past 240 hour(s))  Urine Culture     Status: Abnormal   Collection Time: 10/27/22  8:48 PM   Specimen: Urine, Catheterized  Result Value Ref Range Status   Specimen Description   Final  URINE, CATHETERIZED Performed at [facility], [place] [address], [place], [place] [address]    Special Requests   Final    NONE Performed at [facility], [place] [address], [place], [place] [address]    Culture MULTIPLE SPECIES PRESENT, SUGGEST RECOLLECTION (A)  Final   Report Status 10/29/2022 FINAL  Final     Labs: BNP (last 3 results) Recent Labs    01/08/22 1554 01/22/22 1551 05/22/22 2011  BNP 810.1* 699.9* 970.2*   Basic Metabolic Panel: Recent Labs  Lab 10/27/22 1907 10/28/22 0740 10/29/22 0414 10/30/22 1700 10/31/22 1234 11/01/22 0808 11/02/22 0810 11/03/22 0358  NA 128*   < > 131* 132* 134* 131* 133* 130*  K 4.5   < > 4.6 3.8 4.7 4.5 4.3 3.8  CL 97*   < > 103 109 107 107 109 101  CO2 20*   < > 20* 16* 17* 18* 16* 21*  GLUCOSE 91   < > 116* 138* 172* 101* 83 80  BUN 13   < > 9 6* '9 13 11 15  '$ CREATININE 1.34*   < > 1.28* 1.01* 1.15* 1.22* 1.07* 1.28*  CALCIUM 8.5*   < > 8.1* 8.4* 8.3* 8.1* 8.0* 8.2*  MG 2.0  --   1.7  --   --   --   --   --   PHOS 2.8  --   --   --   --   --   --   --    < > = values in this interval not displayed.   Liver Function Tests: Recent Labs  Lab 10/28/22 0740 10/29/22 0414 10/30/22 1700 10/31/22 1234 11/01/22 0808  AST 16 13* '17 24 17  '$ ALT '9 8 10 11 12  '$ ALKPHOS 79 56 78 70 59  BILITOT 0.6 0.5 0.3 0.4 0.2*  PROT 7.3 5.4* 6.3* 5.6* 5.0*  ALBUMIN 3.5 2.6* 3.2* 2.6* 2.5*   Recent Labs  Lab 10/27/22 1420  LIPASE 27   No results for input(s): "AMMONIA" in the last 168 hours. CBC: Recent Labs  Lab 10/27/22 1907 10/28/22 0740 10/29/22 0414 10/30/22 1700 10/31/22 1234 11/01/22 0808 11/01/22 2326 11/02/22 0810 11/03/22 0358  WBC 7.3   < > 5.7   < > 11.5* 9.2 7.6 7.1 6.9  NEUTROABS 5.0  --  3.7  --   --   --   --   --   --   HGB 11.6*   < > 10.5*   < > 11.0* 10.0* 9.7* 10.1* 10.2*  HCT 35.9*   < > 33.2*   < > 35.7* 31.7* 30.5* 34.1* 31.7*  MCV 86.9   < > 90.2   < > 91.1 90.8 89.7 95.8 90.1  PLT 255   < > 241   < > 247 228 220 218 247   < > = values in this interval not displayed.   Cardiac Enzymes: No results for input(s): "CKTOTAL", "CKMB", "CKMBINDEX", "TROPONINI" in the last 168 hours. BNP: Invalid input(s): "POCBNP" CBG: Recent Labs  Lab 10/28/22 0742 10/30/22 0750 10/31/22 0733 11/01/22 0738 11/03/22 0726  GLUCAP 89 92 174* 92 82   D-Dimer No results for input(s): "DDIMER" in the last 72 hours. Hgb A1c No results for input(s): "HGBA1C" in the last 72 hours. Lipid Profile No results for input(s): "CHOL", "HDL", "LDLCALC", "TRIG", "CHOLHDL", "LDLDIRECT" in the last 72 hours. Thyroid function studies No results for input(s): "TSH", "T4TOTAL", "T3FREE", "THYROIDAB" in the last 72  hours.  Invalid input(s): "FREET3" Anemia work up No results for input(s): "VITAMINB12", "FOLATE", "FERRITIN", "TIBC", "IRON", "RETICCTPCT" in the last 72 hours. Urinalysis    Component Value Date/Time   COLORURINE STRAW (A) 10/27/2022 2048   APPEARANCEUR  CLEAR 10/27/2022 2048   APPEARANCEUR Clear 01/08/2022 1554   LABSPEC 1.028 10/27/2022 2048   PHURINE 7.0 10/27/2022 2048   GLUCOSEU NEGATIVE 10/27/2022 2048   HGBUR NEGATIVE 10/27/2022 2048   BILIRUBINUR NEGATIVE 10/27/2022 2048   BILIRUBINUR Negative 01/08/2022 1554   KETONESUR 5 (A) 10/27/2022 2048   PROTEINUR NEGATIVE 10/27/2022 2048   NITRITE NEGATIVE 10/27/2022 2048   LEUKOCYTESUR SMALL (A) 10/27/2022 2048   Sepsis Labs Recent Labs  Lab 11/01/22 0808 11/01/22 2326 11/02/22 0810 11/03/22 0358  WBC 9.2 7.6 7.1 6.9   Microbiology Recent Results (from the past 240 hour(s))  Urine Culture     Status: Abnormal   Collection Time: 10/27/22  8:48 PM   Specimen: Urine, Catheterized  Result Value Ref Range Status   Specimen Description   Final    URINE, CATHETERIZED Performed at [facility], [place] [address], [place], [place] [address]    Special Requests   Final    NONE Performed at [facility], [place] [address] [place], [place] [address]    Culture MULTIPLE SPECIES PRESENT, SUGGEST RECOLLECTION (A)  Final   Report Status 10/29/2022 FINAL  Final     Time coordinating discharge: 39 minutes  SIGNED: Georgette Shell, MD  [facility] 11/03/2022, 2:02 PM

## 2022-11-03 NOTE — TOC Progression Note (Addendum)
Transition of Care [facility]) - Progression Note    Patient Details  Name: [name] MRN: [id number] Date of Birth: 1930-11-23  Transition of Care [facility]) CM/SW Contact  Leeroy Cha, RN Phone Number: 11/03/2022, 11:00 AM  Clinical Narrative:    Message sent to [facility] for home physical therapy per family request and orders. 1100 Orders accepted.  Expected Discharge Plan: Home/Self Care Barriers to Discharge: Continued Medical Work up  Expected Discharge Plan and Services       Living arrangements for the past 2 months: Single Family Home Expected Discharge Date: 11/03/22                                     Social Determinants of Health (SDOH) Interventions SDOH Screenings   Food Insecurity: No Food Insecurity (10/28/2022)  Housing: Low Risk  (10/28/2022)  Transportation Needs: No Transportation Needs (10/28/2022)  Utilities: Not At Risk (10/28/2022)  Alcohol Screen: Low Risk  (12/16/2021)  Financial Resource Strain: Low Risk  (12/16/2021)  Tobacco Use: Low Risk  (10/31/2022)    Readmission Risk Interventions   No data to display

## 2022-11-05 DIAGNOSIS — I5022 Chronic systolic (congestive) heart failure: Secondary | ICD-10-CM | POA: Diagnosis not present

## 2022-11-05 DIAGNOSIS — E7800 Pure hypercholesterolemia, unspecified: Secondary | ICD-10-CM | POA: Diagnosis not present

## 2022-11-05 DIAGNOSIS — I10 Essential (primary) hypertension: Secondary | ICD-10-CM | POA: Diagnosis not present

## 2022-11-05 LAB — CA 125: Cancer Antigen (CA) 125: 73.6 U/mL — ABNORMAL HIGH (ref 0.0–38.1)

## 2022-11-05 LAB — CEA: CEA: 1.1 ng/mL (ref 0.0–4.7)

## 2022-11-05 LAB — CANCER ANTIGEN 19-9: CA 19-9: 20 U/mL (ref 0–35)

## 2022-11-06 ENCOUNTER — Telehealth (HOSPITAL_BASED_OUTPATIENT_CLINIC_OR_DEPARTMENT_OTHER): Payer: Self-pay | Admitting: Cardiovascular Disease

## 2022-11-06 DIAGNOSIS — I4891 Unspecified atrial fibrillation: Secondary | ICD-10-CM

## 2022-11-06 MED ORDER — APIXABAN 5 MG PO TABS: 5.0000 mg | ORAL_TABLET | Freq: Two times a day (BID) | ORAL | 1 refills | Status: DC

## 2022-11-06 NOTE — Telephone Encounter (Signed)
*  STAT* If patient is at the pharmacy, call can be transferred to refill team.   1. Which medications need to be refilled? (please list name of each medication and dose if known) new prescription for Eliquis  2. Which pharmacy/location (including street and city if local pharmacy) is medication to be sent to? [facility]  [place], [place]  3. Do they need a 30 day or 90 day supply? 47 Days#120 and refills

## 2022-11-06 NOTE — Telephone Encounter (Signed)
Prescription refill request for Eliquis received. Indication: a fib Last office visit: 09/25/22 Scr: 1.28  11/03/22 Age: 89 Weight: 193

## 2022-11-06 NOTE — Telephone Encounter (Signed)
Please review message below concerning Eliquis. Thank you!

## 2022-11-07 DIAGNOSIS — Z7901 Long term (current) use of anticoagulants: Secondary | ICD-10-CM | POA: Diagnosis not present

## 2022-11-07 DIAGNOSIS — I080 Rheumatic disorders of both mitral and aortic valves: Secondary | ICD-10-CM | POA: Diagnosis not present

## 2022-11-07 DIAGNOSIS — E669 Obesity, unspecified: Secondary | ICD-10-CM | POA: Diagnosis not present

## 2022-11-07 DIAGNOSIS — Z48815 Encounter for surgical aftercare following surgery on the digestive system: Secondary | ICD-10-CM | POA: Diagnosis not present

## 2022-11-07 DIAGNOSIS — E871 Hypo-osmolality and hyponatremia: Secondary | ICD-10-CM | POA: Diagnosis not present

## 2022-11-07 DIAGNOSIS — I5042 Chronic combined systolic (congestive) and diastolic (congestive) heart failure: Secondary | ICD-10-CM | POA: Diagnosis not present

## 2022-11-07 DIAGNOSIS — N1832 Chronic kidney disease, stage 3b: Secondary | ICD-10-CM | POA: Diagnosis not present

## 2022-11-07 DIAGNOSIS — K460 Unspecified abdominal hernia with obstruction, without gangrene: Secondary | ICD-10-CM | POA: Diagnosis not present

## 2022-11-07 DIAGNOSIS — D631 Anemia in chronic kidney disease: Secondary | ICD-10-CM | POA: Diagnosis not present

## 2022-11-07 DIAGNOSIS — K219 Gastro-esophageal reflux disease without esophagitis: Secondary | ICD-10-CM | POA: Diagnosis not present

## 2022-11-07 DIAGNOSIS — I495 Sick sinus syndrome: Secondary | ICD-10-CM | POA: Diagnosis not present

## 2022-11-07 DIAGNOSIS — Z950 Presence of cardiac pacemaker: Secondary | ICD-10-CM | POA: Diagnosis not present

## 2022-11-07 DIAGNOSIS — I4510 Unspecified right bundle-branch block: Secondary | ICD-10-CM | POA: Diagnosis not present

## 2022-11-07 DIAGNOSIS — E785 Hyperlipidemia, unspecified: Secondary | ICD-10-CM | POA: Diagnosis not present

## 2022-11-07 DIAGNOSIS — E039 Hypothyroidism, unspecified: Secondary | ICD-10-CM | POA: Diagnosis not present

## 2022-11-07 DIAGNOSIS — M1990 Unspecified osteoarthritis, unspecified site: Secondary | ICD-10-CM | POA: Diagnosis not present

## 2022-11-07 DIAGNOSIS — Z87440 Personal history of urinary (tract) infections: Secondary | ICD-10-CM | POA: Diagnosis not present

## 2022-11-07 DIAGNOSIS — I130 Hypertensive heart and chronic kidney disease with heart failure and stage 1 through stage 4 chronic kidney disease, or unspecified chronic kidney disease: Secondary | ICD-10-CM | POA: Diagnosis not present

## 2022-11-07 DIAGNOSIS — I480 Paroxysmal atrial fibrillation: Secondary | ICD-10-CM | POA: Diagnosis not present

## 2022-11-11 DIAGNOSIS — J90 Pleural effusion, not elsewhere classified: Secondary | ICD-10-CM | POA: Diagnosis not present

## 2022-11-11 DIAGNOSIS — R350 Frequency of micturition: Secondary | ICD-10-CM | POA: Diagnosis not present

## 2022-11-11 DIAGNOSIS — Z9049 Acquired absence of other specified parts of digestive tract: Secondary | ICD-10-CM | POA: Diagnosis not present

## 2022-11-11 DIAGNOSIS — Z09 Encounter for follow-up examination after completed treatment for conditions other than malignant neoplasm: Secondary | ICD-10-CM | POA: Diagnosis not present

## 2022-11-12 DIAGNOSIS — N1832 Chronic kidney disease, stage 3b: Secondary | ICD-10-CM | POA: Diagnosis not present

## 2022-11-12 DIAGNOSIS — I130 Hypertensive heart and chronic kidney disease with heart failure and stage 1 through stage 4 chronic kidney disease, or unspecified chronic kidney disease: Secondary | ICD-10-CM | POA: Diagnosis not present

## 2022-11-12 DIAGNOSIS — I080 Rheumatic disorders of both mitral and aortic valves: Secondary | ICD-10-CM | POA: Diagnosis not present

## 2022-11-12 DIAGNOSIS — Z7901 Long term (current) use of anticoagulants: Secondary | ICD-10-CM | POA: Diagnosis not present

## 2022-11-12 DIAGNOSIS — E039 Hypothyroidism, unspecified: Secondary | ICD-10-CM | POA: Diagnosis not present

## 2022-11-12 DIAGNOSIS — D631 Anemia in chronic kidney disease: Secondary | ICD-10-CM | POA: Diagnosis not present

## 2022-11-12 DIAGNOSIS — K219 Gastro-esophageal reflux disease without esophagitis: Secondary | ICD-10-CM | POA: Diagnosis not present

## 2022-11-12 DIAGNOSIS — Z48815 Encounter for surgical aftercare following surgery on the digestive system: Secondary | ICD-10-CM | POA: Diagnosis not present

## 2022-11-12 DIAGNOSIS — E785 Hyperlipidemia, unspecified: Secondary | ICD-10-CM | POA: Diagnosis not present

## 2022-11-12 DIAGNOSIS — Z87440 Personal history of urinary (tract) infections: Secondary | ICD-10-CM | POA: Diagnosis not present

## 2022-11-12 DIAGNOSIS — I480 Paroxysmal atrial fibrillation: Secondary | ICD-10-CM | POA: Diagnosis not present

## 2022-11-12 DIAGNOSIS — E871 Hypo-osmolality and hyponatremia: Secondary | ICD-10-CM | POA: Diagnosis not present

## 2022-11-12 DIAGNOSIS — E669 Obesity, unspecified: Secondary | ICD-10-CM | POA: Diagnosis not present

## 2022-11-12 DIAGNOSIS — M1990 Unspecified osteoarthritis, unspecified site: Secondary | ICD-10-CM | POA: Diagnosis not present

## 2022-11-12 DIAGNOSIS — I495 Sick sinus syndrome: Secondary | ICD-10-CM | POA: Diagnosis not present

## 2022-11-12 DIAGNOSIS — I5042 Chronic combined systolic (congestive) and diastolic (congestive) heart failure: Secondary | ICD-10-CM | POA: Diagnosis not present

## 2022-11-12 DIAGNOSIS — I4510 Unspecified right bundle-branch block: Secondary | ICD-10-CM | POA: Diagnosis not present

## 2022-11-12 DIAGNOSIS — Z950 Presence of cardiac pacemaker: Secondary | ICD-10-CM | POA: Diagnosis not present

## 2022-11-17 ENCOUNTER — Encounter: Payer: Self-pay | Admitting: *Deleted

## 2022-11-17 ENCOUNTER — Inpatient Hospital Stay: Payer: Medicare HMO | Attending: Oncology | Admitting: Oncology

## 2022-11-17 ENCOUNTER — Encounter: Payer: Self-pay | Admitting: Oncology

## 2022-11-17 VITALS — BP 134/65 | HR 78 | Temp 98.2°F | Resp 18 | Ht 66.0 in | Wt 190.0 lb

## 2022-11-17 DIAGNOSIS — C569 Malignant neoplasm of unspecified ovary: Secondary | ICD-10-CM

## 2022-11-17 NOTE — Progress Notes (Signed)
[facility] New Patient Consult   Requesting MD: Georgette Shell, Md [place],  [place] [id number]   [name] 89 y.o.  [birth year]    Reason for Consult: Metastatic adenocarcinoma   HPI: [name] into the emergency room on 10/07/2022 with 1 week history of abdominal pain.  She was noted to have an umbilical hernia and right upper abdomen tenderness.  A CT abdomen/pelvis revealed an umbilical hernia containing fat with a possible associated soft tissue density.  There is scattered nonspecific "misty "mesentery of the small bowel mesentery.  She was diagnosed with an incarcerated umbilical hernia.  She was referred to Dr. Kae Heller and was taken the operating room for a diagnostic laparoscopy, laparoscopic cholecystectomy, and repair of chronically incarcerated umbilical hernia containing omentum.  There was a small amount of simple appearing ascites, acute and chronic appearing inflammatory adhesions in the left and right lower quadrant.  The hernia sac was excised and nodular implants at the small bowel mesentery were excised. The pathology from the gallbladder revealed chronic cholecystitis, negative for malignancy.  The hernia sac excision revealed metastatic moderately differentiated adenocarcinoma as did the oh mentum biopsy.  Immunohistochemical stains found the tumor cells to be positive for AE1/AE3, CK7, and PAX8.  There is strong partial positive for WT1.  The tumor cells are positive for ER and rare focal PR positivity.  The cells are negative for CK20, CDX2, TTF-1, and GATA 3.  The morphologic and immunohistochemical features are consistent with a gynecologic primary, ovarian primary favored.  She reports resolution of abdominal discomfort following surgery.  She continues to have tenderness/soreness at the upper abdomen.  [name] is here today with her daughter.  Past Medical History:  Diagnosis Date   Chronic combined systolic and  diastolic heart failure ([place]) 08/26/2022   Dyspnea    diastolic dysfunction by echo 2012   GERD (gastroesophageal reflux disease)    Hypertension    Mild mitral and aortic regurgitation    Osteoarthritis    Presence of permanent cardiac pacemaker    Right bundle branch block 10/06/2012   Thyroid disease     .  Atrial fibrillation   .  G2 P2   .  Hearing loss with bilateral hearing aids  Past Surgical History:  Procedure Laterality Date   arm surgery     following an accident   CARDIOVERSION N/A 12/11/2021   Procedure: CARDIOVERSION;  Surgeon: Werner Lean, MD;  Location: [facility];  Service: Cardiovascular;  Laterality: N/A;   CARDIOVERSION N/A 05/22/2022   Procedure: CARDIOVERSION;  Surgeon: [name], MD;  Location: [facility];  Service: Cardiovascular;  Laterality: N/A;   CATARACT EXTRACTION, BILATERAL  2018   CHOLECYSTECTOMY N/A 10/30/2022   Procedure: LAPAROSCOPIC CHOLECYSTECTOMY;  Surgeon: Clovis Riley, MD;  Location: [facility];  Service: General;  Laterality: N/A;   EYE SURGERY     KNEE SURGERY     Following an accident   PACEMAKER IMPLANT N/A 05/27/2022   Procedure: PACEMAKER IMPLANT;  Surgeon: Constance Haw, MD;  Location: [facility];  Service: Cardiovascular;  Laterality: N/A;   TEE WITHOUT CARDIOVERSION N/A 12/11/2021   Procedure: TRANSESOPHAGEAL ECHOCARDIOGRAM (TEE);  Surgeon: Werner Lean, MD;  Location: [facility];  Service: Cardiovascular;  Laterality: N/A;   TONSILLECTOMY AND ADENOIDECTOMY     UMBILICAL HERNIA REPAIR N/A 10/30/2022   Procedure: UMBILICAL HERNIA REPAIR;  Surgeon: Clovis Riley, MD;  Location: [facility];  Service: General;  Laterality: N/A;   VAGINAL HYSTERECTOMY      .  Appendectomy  Medications: Reviewed  Allergies:  Allergies  Allergen Reactions   Atropine Other (See Comments)    Made her entire body jump after they gave it to her.   Miralax [Polyethylene Glycol] Other (See Comments)     17g dose caused stomach troubles for 3 days    Prozac [Fluoxetine] Other (See Comments)    Hyponatremia    Serotonin Reuptake Inhibitors (Ssris) Other (See Comments)    Hyponatremia    Keflex [Cephalexin] Other (See Comments)    Unknown reaction Tolerated Cefdinir 05/2022    Family history: No family history of cancer  Social History:   She lives with her daughter in [place].  She is retired from a [employer] consultant.  She quit smoking cigarettes at age 41.  No alcohol use.  No transfusion history.  No risk factor for HIV or hepatitis.  ROS:   Positives include: Anorexia, 20 pound weight loss, cough since March 2023-improved with antiacid therapy and followed by pulmonary medicine, nausea with movement relieved with Zofran, occasional headache, abdominal pain improved following repair of the umbilical hernia  A complete ROS was otherwise negative.  Physical Exam:  Blood pressure 134/65, pulse 78, temperature 98.2 F (36.8 C), temperature source Oral, resp. rate 18, height 5' 6"$  (1.676 m), weight 190 lb (86.2 kg), SpO2 100 %.  HEENT: Oropharynx without visible mass, neck without mass Lungs: Clear bilaterally Cardiac: Regular rate and rhythm Abdomen: No hepatosplenomegaly, no mass, nontender, no apparent ascites Breast: Bilateral breast without mass Vascular: No leg edema Lymph nodes: No cervical, supraclavicular, axillary, or inguinal nodes Neurologic: Alert and oriented, the motor exam appears intact in the upper and lower extremities bilaterally Skin: No rash Musculoskeletal: No spine tenderness   LAB:  CBC  Lab Results  Component Value Date   WBC 6.9 11/03/2022   HGB 10.2 (L) 11/03/2022   HCT 31.7 (L) 11/03/2022   MCV 90.1 11/03/2022   PLT 247 11/03/2022   NEUTROABS 3.7 10/29/2022        CMP  Lab Results  Component Value Date   NA 130 (L) 11/03/2022   K 3.8 11/03/2022   CL 101 11/03/2022   CO2 21 (L) 11/03/2022   GLUCOSE 80 11/03/2022   BUN 15 11/03/2022    CREATININE 1.28 (H) 11/03/2022   CALCIUM 8.2 (L) 11/03/2022   PROT 5.0 (L) 11/01/2022   ALBUMIN 2.5 (L) 11/01/2022   AST 17 11/01/2022   ALT 12 11/01/2022   ALKPHOS 59 11/01/2022   BILITOT 0.2 (L) 11/01/2022   GFRNONAA 40 (L) 11/03/2022     Lab Results  Component Value Date   CEA1 1.1 11/03/2022   [id number] 20 11/03/2022  CA125-73.6  Imaging:  As per HPI, CT images from 10/07/2022 and 10/27/2022, and CT chest 11/03/2022 reviewed 1   Assessment/Plan:   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 Umbilical hernia sac excision and omental biopsy 10/30/2022-moderately differentiated adenocarcinoma, AE1/AE3, CK7, PAX8, WT1, and ER positive CT abdomen/pelvis 10/07/2022-small to moderate fat-containing umbilical hernia with associated stranding of the contained fat, nonspecific scattered misty mesentery of the small bowel CT abdomen/pelvis 10/27/2022-moderate fat-containing periumbilical hernia with persistent increased density, small volume free fluid and mesenteric stranding CT chest 11/03/2022-no evidence of metastatic disease, mild focal groundglass opacity in the left upper lobe likely infectious or inflammatory, small bilateral pleural effusions and bibasilar atelectasis Mild elevation of the CA125 Abdominal pain-likely secondary to  a chronically incarcerated umbilical hernia-improved History of congestive heart failure History of atrial fibrillation, right bundle branch block, and bradycardia-pacemaker placed 05/27/2022 Hysterectomy G2 P2 Hypertension   Disposition:   [name] has been diagnosed with metastatic adenocarcinoma involving an omental biopsy and an umbilical hernia sac.  The immunohistochemical profile is consistent with a gynecologic primary.  I discussed the differential diagnosis with Ms. Shugar and her daughter.  We discussed the possibilities of ovarian cancer and primary peritoneal carcinoma.  There is no apparent ovarian mass on CT  images.  I discussed multimodality management of these malignancies.  She does not wish to consider surgery or chemotherapy at present.  We discussed hormonal therapy since the tumor is ER positive.  She does not appear to have a large tumor burden based on her symptoms and staging evaluation to date.  She is comfortable with observation.  She declines a GYN oncology evaluation.  Ms. Baldonado will be referred to the genetics counselor.  We will submit the omental biopsy for molecular testing.  She will return for an office visit and repeat Ca125 in 5-6 weeks.  We will plan for a restaging CT evaluation in 2-3 months.  We will discuss chemotherapy, tamoxifen, and surgical options if she develops significant disease progression.  Betsy Coder, MD  11/17/2022, 3:13 PM

## 2022-11-17 NOTE — Progress Notes (Signed)
PATIENT NAVIGATOR PROGRESS NOTE  Name: [name] Date: 11/17/2022 MRN: [id number]  DOB: Mar 06, 1931   Reason for visit:  Initial visit  Comments:  Met with Ms Hallows and daughter during visit with Dr Benay Spice Referral to genetics Options given for referral to gyn onc surgeon or surveillance Will return in 6 weeks for further discussion and lab work, CA125 markers Will arrange for surveillance CT scan in April Given contact information to call with any issues or questions.  May need refill on Zofran, will call if needed    Time spent counseling/coordinating care: > 60 minutes

## 2022-11-18 ENCOUNTER — Telehealth: Payer: Self-pay | Admitting: Genetic Counselor

## 2022-11-18 ENCOUNTER — Encounter: Payer: Self-pay | Admitting: *Deleted

## 2022-11-18 NOTE — Telephone Encounter (Signed)
Scheduled appt per 2/12 referral. Called pt, no answer. Left msg with appt date/time.

## 2022-11-20 ENCOUNTER — Telehealth: Payer: Self-pay | Admitting: *Deleted

## 2022-11-20 ENCOUNTER — Telehealth (HOSPITAL_BASED_OUTPATIENT_CLINIC_OR_DEPARTMENT_OTHER): Payer: Self-pay | Admitting: Cardiovascular Disease

## 2022-11-20 DIAGNOSIS — Z87440 Personal history of urinary (tract) infections: Secondary | ICD-10-CM | POA: Diagnosis not present

## 2022-11-20 DIAGNOSIS — K219 Gastro-esophageal reflux disease without esophagitis: Secondary | ICD-10-CM | POA: Diagnosis not present

## 2022-11-20 DIAGNOSIS — I130 Hypertensive heart and chronic kidney disease with heart failure and stage 1 through stage 4 chronic kidney disease, or unspecified chronic kidney disease: Secondary | ICD-10-CM | POA: Diagnosis not present

## 2022-11-20 DIAGNOSIS — N1832 Chronic kidney disease, stage 3b: Secondary | ICD-10-CM | POA: Diagnosis not present

## 2022-11-20 DIAGNOSIS — I080 Rheumatic disorders of both mitral and aortic valves: Secondary | ICD-10-CM | POA: Diagnosis not present

## 2022-11-20 DIAGNOSIS — E669 Obesity, unspecified: Secondary | ICD-10-CM | POA: Diagnosis not present

## 2022-11-20 DIAGNOSIS — E039 Hypothyroidism, unspecified: Secondary | ICD-10-CM | POA: Diagnosis not present

## 2022-11-20 DIAGNOSIS — I4510 Unspecified right bundle-branch block: Secondary | ICD-10-CM | POA: Diagnosis not present

## 2022-11-20 DIAGNOSIS — Z950 Presence of cardiac pacemaker: Secondary | ICD-10-CM | POA: Diagnosis not present

## 2022-11-20 DIAGNOSIS — Z48815 Encounter for surgical aftercare following surgery on the digestive system: Secondary | ICD-10-CM | POA: Diagnosis not present

## 2022-11-20 DIAGNOSIS — E785 Hyperlipidemia, unspecified: Secondary | ICD-10-CM | POA: Diagnosis not present

## 2022-11-20 DIAGNOSIS — E871 Hypo-osmolality and hyponatremia: Secondary | ICD-10-CM | POA: Diagnosis not present

## 2022-11-20 DIAGNOSIS — I5042 Chronic combined systolic (congestive) and diastolic (congestive) heart failure: Secondary | ICD-10-CM | POA: Diagnosis not present

## 2022-11-20 DIAGNOSIS — D631 Anemia in chronic kidney disease: Secondary | ICD-10-CM | POA: Diagnosis not present

## 2022-11-20 DIAGNOSIS — M1990 Unspecified osteoarthritis, unspecified site: Secondary | ICD-10-CM | POA: Diagnosis not present

## 2022-11-20 DIAGNOSIS — I480 Paroxysmal atrial fibrillation: Secondary | ICD-10-CM | POA: Diagnosis not present

## 2022-11-20 DIAGNOSIS — I495 Sick sinus syndrome: Secondary | ICD-10-CM | POA: Diagnosis not present

## 2022-11-20 DIAGNOSIS — Z7901 Long term (current) use of anticoagulants: Secondary | ICD-10-CM | POA: Diagnosis not present

## 2022-11-20 MED ORDER — POTASSIUM CHLORIDE CRYS ER 10 MEQ PO TBCR: 20.0000 meq | EXTENDED_RELEASE_TABLET | Freq: Every day | ORAL | 3 refills | Status: DC

## 2022-11-20 NOTE — Telephone Encounter (Signed)
Ok to send in prescription for 50m (2 tabs) to be smaller for patient to take?

## 2022-11-20 NOTE — Telephone Encounter (Signed)
Per Dr. Benay Spice: Continue w/daily zofran in am. If nausea persists, will order CT scan with oral contrast and see her soon afterwards. Daughter agrees to this plan.

## 2022-11-20 NOTE — Telephone Encounter (Signed)
Per Laurann Montana, NP-   "Fine to send two 40m tablets daily.   CLoel Dubonnet NP "

## 2022-11-20 NOTE — Addendum Note (Signed)
Addended by: Gerald Stabs on: 11/20/2022 12:58 PM   Modules accepted: Orders

## 2022-11-20 NOTE — Telephone Encounter (Signed)
[name]:  1. Name of Medication:  potassium chloride SA (KLOR-CON M) 20 MEQ tablet   2. How are you currently taking this medication (dosage and times per day)? As prescribed   3. Are you having a reaction (difficulty breathing--STAT)? No   4. What is your medication issue? Patient's [name] is calling stating that patient is having a hard time taking this medication in the morning due to having nausea issues since being diagnosed with cancer. She report she has to put it in applesauce for her and is wanting to know if a smaller tablet can be prescribed. Please advise.

## 2022-11-20 NOTE — Telephone Encounter (Addendum)
Daughter left VM that patient is having persistent nausea and vomited today. Asking for advice to manage. Noted on 11/14/22 an outside provider ordered Zofran 4 mg ODT #2 tablets. Attempted to call daughter and call went to voice mail. Daughter returned call. She has not taken the zofran for a couple days because she felt OK. She does report that when she has nausea, it tends to be in the morning. Suggested she start each day with an ODT zofran and then wait 30 minutes to take other meds and eat. Reports that her bowels are moving, so she is not constipated.

## 2022-11-20 NOTE — Telephone Encounter (Signed)
Fine to send two 39m tablets daily.  CLoel Dubonnet NP

## 2022-11-24 ENCOUNTER — Encounter: Payer: Self-pay | Admitting: Pulmonary Disease

## 2022-11-25 NOTE — Telephone Encounter (Signed)
FYI

## 2022-11-27 DIAGNOSIS — I5042 Chronic combined systolic (congestive) and diastolic (congestive) heart failure: Secondary | ICD-10-CM | POA: Diagnosis not present

## 2022-11-27 DIAGNOSIS — Z7901 Long term (current) use of anticoagulants: Secondary | ICD-10-CM | POA: Diagnosis not present

## 2022-11-27 DIAGNOSIS — D631 Anemia in chronic kidney disease: Secondary | ICD-10-CM | POA: Diagnosis not present

## 2022-11-27 DIAGNOSIS — E669 Obesity, unspecified: Secondary | ICD-10-CM | POA: Diagnosis not present

## 2022-11-27 DIAGNOSIS — I080 Rheumatic disorders of both mitral and aortic valves: Secondary | ICD-10-CM | POA: Diagnosis not present

## 2022-11-27 DIAGNOSIS — E039 Hypothyroidism, unspecified: Secondary | ICD-10-CM | POA: Diagnosis not present

## 2022-11-27 DIAGNOSIS — I130 Hypertensive heart and chronic kidney disease with heart failure and stage 1 through stage 4 chronic kidney disease, or unspecified chronic kidney disease: Secondary | ICD-10-CM | POA: Diagnosis not present

## 2022-11-27 DIAGNOSIS — E785 Hyperlipidemia, unspecified: Secondary | ICD-10-CM | POA: Diagnosis not present

## 2022-11-27 DIAGNOSIS — K219 Gastro-esophageal reflux disease without esophagitis: Secondary | ICD-10-CM | POA: Diagnosis not present

## 2022-11-27 DIAGNOSIS — C569 Malignant neoplasm of unspecified ovary: Secondary | ICD-10-CM | POA: Diagnosis not present

## 2022-11-27 DIAGNOSIS — E871 Hypo-osmolality and hyponatremia: Secondary | ICD-10-CM | POA: Diagnosis not present

## 2022-11-27 DIAGNOSIS — Z87440 Personal history of urinary (tract) infections: Secondary | ICD-10-CM | POA: Diagnosis not present

## 2022-11-27 DIAGNOSIS — N1832 Chronic kidney disease, stage 3b: Secondary | ICD-10-CM | POA: Diagnosis not present

## 2022-11-27 DIAGNOSIS — Z950 Presence of cardiac pacemaker: Secondary | ICD-10-CM | POA: Diagnosis not present

## 2022-11-27 DIAGNOSIS — M1990 Unspecified osteoarthritis, unspecified site: Secondary | ICD-10-CM | POA: Diagnosis not present

## 2022-11-27 DIAGNOSIS — I4510 Unspecified right bundle-branch block: Secondary | ICD-10-CM | POA: Diagnosis not present

## 2022-11-27 DIAGNOSIS — I495 Sick sinus syndrome: Secondary | ICD-10-CM | POA: Diagnosis not present

## 2022-11-27 DIAGNOSIS — I480 Paroxysmal atrial fibrillation: Secondary | ICD-10-CM | POA: Diagnosis not present

## 2022-11-27 DIAGNOSIS — Z48815 Encounter for surgical aftercare following surgery on the digestive system: Secondary | ICD-10-CM | POA: Diagnosis not present

## 2022-11-28 ENCOUNTER — Encounter (HOSPITAL_COMMUNITY): Payer: Self-pay | Admitting: Oncology

## 2022-11-28 NOTE — Telephone Encounter (Signed)
November 28, 2022 [name], MD  to Gerald Stabs, RN  Loel Dubonnet, NP     11/28/22  9:16 AM Thank you

## 2022-12-01 ENCOUNTER — Ambulatory Visit: Payer: Medicare HMO

## 2022-12-01 DIAGNOSIS — I495 Sick sinus syndrome: Secondary | ICD-10-CM

## 2022-12-02 DIAGNOSIS — I080 Rheumatic disorders of both mitral and aortic valves: Secondary | ICD-10-CM | POA: Diagnosis not present

## 2022-12-02 DIAGNOSIS — D631 Anemia in chronic kidney disease: Secondary | ICD-10-CM | POA: Diagnosis not present

## 2022-12-02 DIAGNOSIS — Z87440 Personal history of urinary (tract) infections: Secondary | ICD-10-CM | POA: Diagnosis not present

## 2022-12-02 DIAGNOSIS — E785 Hyperlipidemia, unspecified: Secondary | ICD-10-CM | POA: Diagnosis not present

## 2022-12-02 DIAGNOSIS — I495 Sick sinus syndrome: Secondary | ICD-10-CM | POA: Diagnosis not present

## 2022-12-02 DIAGNOSIS — I130 Hypertensive heart and chronic kidney disease with heart failure and stage 1 through stage 4 chronic kidney disease, or unspecified chronic kidney disease: Secondary | ICD-10-CM | POA: Diagnosis not present

## 2022-12-02 DIAGNOSIS — Z48815 Encounter for surgical aftercare following surgery on the digestive system: Secondary | ICD-10-CM | POA: Diagnosis not present

## 2022-12-02 DIAGNOSIS — I480 Paroxysmal atrial fibrillation: Secondary | ICD-10-CM | POA: Diagnosis not present

## 2022-12-02 DIAGNOSIS — Z7901 Long term (current) use of anticoagulants: Secondary | ICD-10-CM | POA: Diagnosis not present

## 2022-12-02 DIAGNOSIS — I5042 Chronic combined systolic (congestive) and diastolic (congestive) heart failure: Secondary | ICD-10-CM | POA: Diagnosis not present

## 2022-12-02 DIAGNOSIS — K219 Gastro-esophageal reflux disease without esophagitis: Secondary | ICD-10-CM | POA: Diagnosis not present

## 2022-12-02 DIAGNOSIS — N1832 Chronic kidney disease, stage 3b: Secondary | ICD-10-CM | POA: Diagnosis not present

## 2022-12-02 DIAGNOSIS — Z950 Presence of cardiac pacemaker: Secondary | ICD-10-CM | POA: Diagnosis not present

## 2022-12-02 DIAGNOSIS — M1990 Unspecified osteoarthritis, unspecified site: Secondary | ICD-10-CM | POA: Diagnosis not present

## 2022-12-02 DIAGNOSIS — E669 Obesity, unspecified: Secondary | ICD-10-CM | POA: Diagnosis not present

## 2022-12-02 DIAGNOSIS — E039 Hypothyroidism, unspecified: Secondary | ICD-10-CM | POA: Diagnosis not present

## 2022-12-02 DIAGNOSIS — I4510 Unspecified right bundle-branch block: Secondary | ICD-10-CM | POA: Diagnosis not present

## 2022-12-02 DIAGNOSIS — E871 Hypo-osmolality and hyponatremia: Secondary | ICD-10-CM | POA: Diagnosis not present

## 2022-12-02 LAB — CUP PACEART REMOTE DEVICE CHECK
Battery Remaining Longevity: 130 mo
Battery Voltage: 3.15 V
Brady Statistic AP VP Percent: 0.06 %
Brady Statistic AP VS Percent: 99.41 %
Brady Statistic AS VP Percent: 0 %
Brady Statistic AS VS Percent: 0.53 %
Brady Statistic RA Percent Paced: 99.46 %
Brady Statistic RV Percent Paced: 0.07 %
Date Time Interrogation Session: 20240226030301
Implantable Lead Connection Status: 753985
Implantable Lead Connection Status: 753985
Implantable Lead Implant Date: 20230822
Implantable Lead Implant Date: 20230822
Implantable Lead Location: 753859
Implantable Lead Location: 753860
Implantable Lead Model: 3830
Implantable Lead Model: 5076
Implantable Pulse Generator Implant Date: 20230822
Lead Channel Impedance Value: 361 Ohm
Lead Channel Impedance Value: 475 Ohm
Lead Channel Impedance Value: 494 Ohm
Lead Channel Impedance Value: 798 Ohm
Lead Channel Pacing Threshold Amplitude: 1.25 V
Lead Channel Pacing Threshold Amplitude: 1.75 V
Lead Channel Pacing Threshold Pulse Width: 0.4 ms
Lead Channel Pacing Threshold Pulse Width: 0.4 ms
Lead Channel Sensing Intrinsic Amplitude: 2.625 mV
Lead Channel Sensing Intrinsic Amplitude: 2.625 mV
Lead Channel Sensing Intrinsic Amplitude: 20.25 mV
Lead Channel Sensing Intrinsic Amplitude: 20.25 mV
Lead Channel Setting Pacing Amplitude: 2 V
Lead Channel Setting Pacing Amplitude: 2.75 V
Lead Channel Setting Pacing Pulse Width: 0.4 ms
Lead Channel Setting Sensing Sensitivity: 0.9 mV
Zone Setting Status: 755011

## 2022-12-04 DIAGNOSIS — G4700 Insomnia, unspecified: Secondary | ICD-10-CM | POA: Diagnosis not present

## 2022-12-04 DIAGNOSIS — I2510 Atherosclerotic heart disease of native coronary artery without angina pectoris: Secondary | ICD-10-CM | POA: Diagnosis not present

## 2022-12-04 DIAGNOSIS — E669 Obesity, unspecified: Secondary | ICD-10-CM | POA: Diagnosis not present

## 2022-12-04 DIAGNOSIS — R69 Illness, unspecified: Secondary | ICD-10-CM | POA: Diagnosis not present

## 2022-12-04 DIAGNOSIS — R32 Unspecified urinary incontinence: Secondary | ICD-10-CM | POA: Diagnosis not present

## 2022-12-04 DIAGNOSIS — Z008 Encounter for other general examination: Secondary | ICD-10-CM | POA: Diagnosis not present

## 2022-12-04 DIAGNOSIS — K219 Gastro-esophageal reflux disease without esophagitis: Secondary | ICD-10-CM | POA: Diagnosis not present

## 2022-12-04 DIAGNOSIS — M1990 Unspecified osteoarthritis, unspecified site: Secondary | ICD-10-CM | POA: Diagnosis not present

## 2022-12-04 DIAGNOSIS — F419 Anxiety disorder, unspecified: Secondary | ICD-10-CM | POA: Diagnosis not present

## 2022-12-04 DIAGNOSIS — I4891 Unspecified atrial fibrillation: Secondary | ICD-10-CM | POA: Diagnosis not present

## 2022-12-04 DIAGNOSIS — E785 Hyperlipidemia, unspecified: Secondary | ICD-10-CM | POA: Diagnosis not present

## 2022-12-04 DIAGNOSIS — L309 Dermatitis, unspecified: Secondary | ICD-10-CM | POA: Diagnosis not present

## 2022-12-04 DIAGNOSIS — E039 Hypothyroidism, unspecified: Secondary | ICD-10-CM | POA: Diagnosis not present

## 2022-12-04 DIAGNOSIS — J45909 Unspecified asthma, uncomplicated: Secondary | ICD-10-CM | POA: Diagnosis not present

## 2022-12-06 ENCOUNTER — Encounter (HOSPITAL_BASED_OUTPATIENT_CLINIC_OR_DEPARTMENT_OTHER): Payer: Self-pay

## 2022-12-06 ENCOUNTER — Inpatient Hospital Stay (HOSPITAL_BASED_OUTPATIENT_CLINIC_OR_DEPARTMENT_OTHER)
Admission: EM | Admit: 2022-12-06 | Discharge: 2022-12-16 | DRG: 948 | Disposition: A | Discharge status: Hospice | Payer: Medicare HMO | Attending: Internal Medicine | Admitting: Internal Medicine

## 2022-12-06 ENCOUNTER — Emergency Department (HOSPITAL_BASED_OUTPATIENT_CLINIC_OR_DEPARTMENT_OTHER): Payer: Medicare HMO

## 2022-12-06 ENCOUNTER — Other Ambulatory Visit: Payer: Self-pay

## 2022-12-06 DIAGNOSIS — C786 Secondary malignant neoplasm of retroperitoneum and peritoneum: Secondary | ICD-10-CM | POA: Diagnosis present

## 2022-12-06 DIAGNOSIS — R109 Unspecified abdominal pain: Secondary | ICD-10-CM

## 2022-12-06 DIAGNOSIS — N1832 Chronic kidney disease, stage 3b: Secondary | ICD-10-CM | POA: Diagnosis not present

## 2022-12-06 DIAGNOSIS — Z79890 Hormone replacement therapy (postmenopausal): Secondary | ICD-10-CM

## 2022-12-06 DIAGNOSIS — K449 Diaphragmatic hernia without obstruction or gangrene: Secondary | ICD-10-CM | POA: Diagnosis present

## 2022-12-06 DIAGNOSIS — K429 Umbilical hernia without obstruction or gangrene: Secondary | ICD-10-CM | POA: Diagnosis present

## 2022-12-06 DIAGNOSIS — Z9841 Cataract extraction status, right eye: Secondary | ICD-10-CM

## 2022-12-06 DIAGNOSIS — I701 Atherosclerosis of renal artery: Secondary | ICD-10-CM | POA: Diagnosis not present

## 2022-12-06 DIAGNOSIS — C569 Malignant neoplasm of unspecified ovary: Secondary | ICD-10-CM | POA: Diagnosis present

## 2022-12-06 DIAGNOSIS — E871 Hypo-osmolality and hyponatremia: Secondary | ICD-10-CM | POA: Diagnosis not present

## 2022-12-06 DIAGNOSIS — I480 Paroxysmal atrial fibrillation: Secondary | ICD-10-CM | POA: Diagnosis present

## 2022-12-06 DIAGNOSIS — R110 Nausea: Secondary | ICD-10-CM | POA: Diagnosis present

## 2022-12-06 DIAGNOSIS — I5030 Unspecified diastolic (congestive) heart failure: Secondary | ICD-10-CM | POA: Diagnosis present

## 2022-12-06 DIAGNOSIS — G893 Neoplasm related pain (acute) (chronic): Secondary | ICD-10-CM | POA: Diagnosis not present

## 2022-12-06 DIAGNOSIS — N390 Urinary tract infection, site not specified: Secondary | ICD-10-CM | POA: Diagnosis not present

## 2022-12-06 DIAGNOSIS — I5022 Chronic systolic (congestive) heart failure: Secondary | ICD-10-CM

## 2022-12-06 DIAGNOSIS — I130 Hypertensive heart and chronic kidney disease with heart failure and stage 1 through stage 4 chronic kidney disease, or unspecified chronic kidney disease: Secondary | ICD-10-CM | POA: Diagnosis present

## 2022-12-06 DIAGNOSIS — I700 Atherosclerosis of aorta: Secondary | ICD-10-CM | POA: Diagnosis present

## 2022-12-06 DIAGNOSIS — K219 Gastro-esophageal reflux disease without esophagitis: Secondary | ICD-10-CM | POA: Diagnosis present

## 2022-12-06 DIAGNOSIS — Z6834 Body mass index (BMI) 34.0-34.9, adult: Secondary | ICD-10-CM

## 2022-12-06 DIAGNOSIS — I080 Rheumatic disorders of both mitral and aortic valves: Secondary | ICD-10-CM | POA: Diagnosis present

## 2022-12-06 DIAGNOSIS — C481 Malignant neoplasm of specified parts of peritoneum: Secondary | ICD-10-CM | POA: Diagnosis not present

## 2022-12-06 DIAGNOSIS — Z515 Encounter for palliative care: Secondary | ICD-10-CM

## 2022-12-06 DIAGNOSIS — R400 Somnolence: Secondary | ICD-10-CM | POA: Diagnosis not present

## 2022-12-06 DIAGNOSIS — Z794 Long term (current) use of insulin: Secondary | ICD-10-CM | POA: Diagnosis not present

## 2022-12-06 DIAGNOSIS — E669 Obesity, unspecified: Secondary | ICD-10-CM | POA: Diagnosis present

## 2022-12-06 DIAGNOSIS — N281 Cyst of kidney, acquired: Secondary | ICD-10-CM | POA: Diagnosis not present

## 2022-12-06 DIAGNOSIS — R338 Other retention of urine: Secondary | ICD-10-CM | POA: Diagnosis not present

## 2022-12-06 DIAGNOSIS — B965 Pseudomonas (aeruginosa) (mallei) (pseudomallei) as the cause of diseases classified elsewhere: Secondary | ICD-10-CM | POA: Diagnosis present

## 2022-12-06 DIAGNOSIS — R1084 Generalized abdominal pain: Secondary | ICD-10-CM | POA: Diagnosis not present

## 2022-12-06 DIAGNOSIS — Z9049 Acquired absence of other specified parts of digestive tract: Secondary | ICD-10-CM

## 2022-12-06 DIAGNOSIS — I4510 Unspecified right bundle-branch block: Secondary | ICD-10-CM | POA: Diagnosis present

## 2022-12-06 DIAGNOSIS — Z79899 Other long term (current) drug therapy: Secondary | ICD-10-CM

## 2022-12-06 DIAGNOSIS — E039 Hypothyroidism, unspecified: Secondary | ICD-10-CM | POA: Diagnosis not present

## 2022-12-06 DIAGNOSIS — Z87898 Personal history of other specified conditions: Secondary | ICD-10-CM

## 2022-12-06 DIAGNOSIS — F419 Anxiety disorder, unspecified: Secondary | ICD-10-CM | POA: Diagnosis present

## 2022-12-06 DIAGNOSIS — C799 Secondary malignant neoplasm of unspecified site: Secondary | ICD-10-CM | POA: Diagnosis present

## 2022-12-06 DIAGNOSIS — M1990 Unspecified osteoarthritis, unspecified site: Secondary | ICD-10-CM | POA: Diagnosis present

## 2022-12-06 DIAGNOSIS — Z170 Estrogen receptor positive status [ER+]: Secondary | ICD-10-CM

## 2022-12-06 DIAGNOSIS — E440 Moderate protein-calorie malnutrition: Secondary | ICD-10-CM | POA: Diagnosis not present

## 2022-12-06 DIAGNOSIS — N179 Acute kidney failure, unspecified: Secondary | ICD-10-CM | POA: Diagnosis present

## 2022-12-06 DIAGNOSIS — Z950 Presence of cardiac pacemaker: Secondary | ICD-10-CM

## 2022-12-06 DIAGNOSIS — Z8249 Family history of ischemic heart disease and other diseases of the circulatory system: Secondary | ICD-10-CM

## 2022-12-06 DIAGNOSIS — C801 Malignant (primary) neoplasm, unspecified: Secondary | ICD-10-CM | POA: Diagnosis present

## 2022-12-06 DIAGNOSIS — I5042 Chronic combined systolic (congestive) and diastolic (congestive) heart failure: Secondary | ICD-10-CM | POA: Diagnosis present

## 2022-12-06 DIAGNOSIS — Z90710 Acquired absence of both cervix and uterus: Secondary | ICD-10-CM

## 2022-12-06 DIAGNOSIS — E785 Hyperlipidemia, unspecified: Secondary | ICD-10-CM | POA: Diagnosis present

## 2022-12-06 DIAGNOSIS — K5900 Constipation, unspecified: Secondary | ICD-10-CM | POA: Diagnosis present

## 2022-12-06 DIAGNOSIS — Z5329 Procedure and treatment not carried out because of patient's decision for other reasons: Secondary | ICD-10-CM | POA: Diagnosis present

## 2022-12-06 DIAGNOSIS — Z66 Do not resuscitate: Secondary | ICD-10-CM | POA: Diagnosis not present

## 2022-12-06 DIAGNOSIS — I495 Sick sinus syndrome: Secondary | ICD-10-CM | POA: Diagnosis present

## 2022-12-06 DIAGNOSIS — Z7901 Long term (current) use of anticoagulants: Secondary | ICD-10-CM

## 2022-12-06 DIAGNOSIS — Z9842 Cataract extraction status, left eye: Secondary | ICD-10-CM

## 2022-12-06 DIAGNOSIS — Z6833 Body mass index (BMI) 33.0-33.9, adult: Secondary | ICD-10-CM

## 2022-12-06 DIAGNOSIS — H9190 Unspecified hearing loss, unspecified ear: Secondary | ICD-10-CM | POA: Diagnosis present

## 2022-12-06 LAB — LACTIC ACID, PLASMA: Lactic Acid, Venous: 0.9 mmol/L (ref 0.5–1.9)

## 2022-12-06 LAB — COMPREHENSIVE METABOLIC PANEL
ALT: 7 U/L (ref 0–44)
AST: 13 U/L — ABNORMAL LOW (ref 15–41)
Albumin: 4.1 g/dL (ref 3.5–5.0)
Alkaline Phosphatase: 85 U/L (ref 38–126)
Anion gap: 11 (ref 5–15)
BUN: 17 mg/dL (ref 8–23)
CO2: 25 mmol/L (ref 22–32)
Calcium: 9.7 mg/dL (ref 8.9–10.3)
Chloride: 97 mmol/L — ABNORMAL LOW (ref 98–111)
Creatinine, Ser: 1.35 mg/dL — ABNORMAL HIGH (ref 0.44–1.00)
GFR, Estimated: 38 mL/min — ABNORMAL LOW (ref >60)
Glucose, Bld: 90 mg/dL (ref 70–99)
Potassium: 4.6 mmol/L (ref 3.5–5.1)
Sodium: 133 mmol/L — ABNORMAL LOW (ref 135–145)
Total Bilirubin: 0.8 mg/dL (ref 0.3–1.2)
Total Protein: 7.5 g/dL (ref 6.5–8.1)

## 2022-12-06 LAB — CBC
HCT: 40.5 % (ref 36.0–46.0)
Hemoglobin: 13.2 g/dL (ref 12.0–15.0)
MCH: 28.6 pg (ref 26.0–34.0)
MCHC: 32.6 g/dL (ref 30.0–36.0)
MCV: 87.9 fL (ref 80.0–100.0)
Platelets: 220 10*3/uL (ref 150–400)
RBC: 4.61 MIL/uL (ref 3.87–5.11)
RDW: 14.7 % (ref 11.5–15.5)
WBC: 7.1 10*3/uL (ref 4.0–10.5)
nRBC: 0 % (ref 0.0–0.2)

## 2022-12-06 LAB — URINALYSIS, ROUTINE W REFLEX MICROSCOPIC
Bilirubin Urine: NEGATIVE
Glucose, UA: NEGATIVE mg/dL
Hgb urine dipstick: NEGATIVE
Leukocytes,Ua: NEGATIVE
Nitrite: NEGATIVE
Protein, ur: NEGATIVE mg/dL
Specific Gravity, Urine: 1.016 (ref 1.005–1.030)
pH: 6.5 (ref 5.0–8.0)

## 2022-12-06 LAB — LIPASE, BLOOD: Lipase: <10 U/L — ABNORMAL LOW (ref 11–51)

## 2022-12-06 MED ORDER — APIXABAN 5 MG PO TABS
5.0000 mg | ORAL_TABLET | Freq: Two times a day (BID) | ORAL | Status: DC
  Administered 2022-12-06 – 2022-12-09 (×6): 5 mg via ORAL
  Filled 2022-12-06 (×6): qty 1

## 2022-12-06 MED ORDER — FENTANYL CITRATE PF 50 MCG/ML IJ SOSY
50.0000 ug | PREFILLED_SYRINGE | Freq: Once | INTRAMUSCULAR | Status: AC
  Administered 2022-12-06: 50 ug via INTRAVENOUS
  Filled 2022-12-06: qty 1

## 2022-12-06 MED ORDER — LEVOTHYROXINE SODIUM 25 MCG PO TABS
225.0000 ug | ORAL_TABLET | Freq: Every day | ORAL | Status: DC
  Administered 2022-12-06 – 2022-12-16 (×11): 225 ug via ORAL
  Filled 2022-12-06 (×11): qty 1

## 2022-12-06 MED ORDER — FUROSEMIDE 20 MG PO TABS
20.0000 mg | ORAL_TABLET | Freq: Every day | ORAL | Status: DC
  Administered 2022-12-06 – 2022-12-08 (×3): 20 mg via ORAL
  Filled 2022-12-06 (×3): qty 1

## 2022-12-06 MED ORDER — ONDANSETRON HCL 4 MG/2ML IJ SOLN
4.0000 mg | Freq: Four times a day (QID) | INTRAMUSCULAR | Status: DC | PRN
  Administered 2022-12-06 – 2022-12-11 (×6): 4 mg via INTRAVENOUS
  Filled 2022-12-06 (×6): qty 2

## 2022-12-06 MED ORDER — METOPROLOL SUCCINATE ER 25 MG PO TB24
12.5000 mg | ORAL_TABLET | Freq: Every day | ORAL | Status: DC
  Administered 2022-12-08 – 2022-12-16 (×9): 12.5 mg via ORAL
  Filled 2022-12-06 (×11): qty 1

## 2022-12-06 MED ORDER — LEVOTHYROXINE SODIUM 100 MCG PO TABS: 200.0000 ug | ORAL_TABLET | Freq: Every day | ORAL | Status: DC

## 2022-12-06 MED ORDER — AMIODARONE HCL 200 MG PO TABS
200.0000 mg | ORAL_TABLET | Freq: Every day | ORAL | Status: DC
  Administered 2022-12-06 – 2022-12-16 (×11): 200 mg via ORAL
  Filled 2022-12-06 (×11): qty 1

## 2022-12-06 MED ORDER — ACETAMINOPHEN 325 MG PO TABS
650.0000 mg | ORAL_TABLET | Freq: Four times a day (QID) | ORAL | Status: DC | PRN
  Administered 2022-12-11: 650 mg via ORAL
  Filled 2022-12-06: qty 2

## 2022-12-06 MED ORDER — SPIRONOLACTONE 12.5 MG HALF TABLET
12.5000 mg | ORAL_TABLET | Freq: Every day | ORAL | Status: DC
  Administered 2022-12-06 – 2022-12-07 (×2): 12.5 mg via ORAL
  Filled 2022-12-06 (×2): qty 1

## 2022-12-06 MED ORDER — HYDROCODONE-ACETAMINOPHEN 5-325 MG PO TABS
1.0000 | ORAL_TABLET | ORAL | Status: DC | PRN
  Administered 2022-12-07 (×4): 2 via ORAL
  Administered 2022-12-08: 1 via ORAL
  Filled 2022-12-06: qty 1
  Filled 2022-12-06 (×4): qty 2

## 2022-12-06 MED ORDER — HYDROMORPHONE HCL 1 MG/ML IJ SOLN
1.0000 mg | Freq: Once | INTRAMUSCULAR | Status: AC
  Administered 2022-12-06: 1 mg via INTRAVENOUS
  Filled 2022-12-06: qty 1

## 2022-12-06 MED ORDER — LORAZEPAM 0.5 MG PO TABS
0.5000 mg | ORAL_TABLET | Freq: Every day | ORAL | Status: DC
  Administered 2022-12-06 – 2022-12-07 (×2): 0.5 mg via ORAL
  Filled 2022-12-06 (×2): qty 1

## 2022-12-06 MED ORDER — IOHEXOL 350 MG/ML SOLN
100.0000 mL | Freq: Once | INTRAVENOUS | Status: AC | PRN
  Administered 2022-12-06: 80 mL via INTRAVENOUS

## 2022-12-06 MED ORDER — IOHEXOL 300 MG/ML  SOLN
100.0000 mL | Freq: Once | INTRAMUSCULAR | Status: AC | PRN
  Administered 2022-12-06: 60 mL via INTRAVENOUS

## 2022-12-06 MED ORDER — LACTATED RINGERS IV BOLUS
1000.0000 mL | Freq: Once | INTRAVENOUS | Status: AC
  Administered 2022-12-06: 1000 mL via INTRAVENOUS

## 2022-12-06 MED ORDER — ONDANSETRON HCL 4 MG/2ML IJ SOLN
4.0000 mg | Freq: Once | INTRAMUSCULAR | Status: AC
  Administered 2022-12-06: 4 mg via INTRAVENOUS
  Filled 2022-12-06: qty 2

## 2022-12-06 MED ORDER — SACUBITRIL-VALSARTAN 24-26 MG PO TABS
1.0000 | ORAL_TABLET | Freq: Two times a day (BID) | ORAL | Status: DC
  Administered 2022-12-06 – 2022-12-07 (×2): 1 via ORAL
  Filled 2022-12-06 (×2): qty 1

## 2022-12-06 MED ORDER — SENNOSIDES-DOCUSATE SODIUM 8.6-50 MG PO TABS: 1.0000 | ORAL_TABLET | Freq: Two times a day (BID) | ORAL | Status: DC | PRN

## 2022-12-06 MED ORDER — POLYETHYLENE GLYCOL 3350 17 G PO PACK
17.0000 g | PACK | Freq: Every day | ORAL | Status: DC
  Filled 2022-12-06 (×3): qty 1

## 2022-12-06 MED ORDER — HYDROMORPHONE HCL 1 MG/ML IJ SOLN
0.5000 mg | INTRAMUSCULAR | Status: DC | PRN
  Administered 2022-12-06 – 2022-12-08 (×5): 0.5 mg via INTRAVENOUS
  Filled 2022-12-06 (×5): qty 0.5

## 2022-12-06 MED ORDER — PANTOPRAZOLE SODIUM 40 MG PO TBEC
40.0000 mg | DELAYED_RELEASE_TABLET | Freq: Every day | ORAL | Status: DC
  Administered 2022-12-07 – 2022-12-10 (×4): 40 mg via ORAL
  Filled 2022-12-06 (×4): qty 1

## 2022-12-06 MED ORDER — GABAPENTIN 100 MG PO CAPS
100.0000 mg | ORAL_CAPSULE | Freq: Two times a day (BID) | ORAL | Status: DC
  Administered 2022-12-06 – 2022-12-14 (×16): 100 mg via ORAL
  Filled 2022-12-06 (×16): qty 1

## 2022-12-06 MED ORDER — LEVOTHYROXINE SODIUM 25 MCG PO TABS: 25.0000 ug | ORAL_TABLET | Freq: Every day | ORAL | Status: DC

## 2022-12-06 MED ORDER — ACETAMINOPHEN 650 MG RE SUPP: 650.0000 mg | Freq: Four times a day (QID) | RECTAL | Status: DC | PRN

## 2022-12-06 MED ORDER — IPRATROPIUM BROMIDE 0.02 % IN SOLN
2.5000 mL | Freq: Two times a day (BID) | RESPIRATORY_TRACT | Status: DC
  Administered 2022-12-06 – 2022-12-07 (×4): 0.5 mg via RESPIRATORY_TRACT
  Filled 2022-12-06 (×6): qty 2.5

## 2022-12-06 NOTE — H&P (Signed)
History and Physical    [name] [id number] DOB: [birth year] DOA: 12/06/2022  PCP: Deon Pilling, NP  Patient coming from: Home  I have personally briefly reviewed patient's old medical records in [place]  Chief Complaint: Abdominal pain  HPI: [name] is a 89 y.o. 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  Patient reports yesterday developing significant generalized abdominal pain associated with nausea without emesis.  She is having some loose stools but denies any diarrhea.  Last BM was yesterday.  She is passing flatus.  She has not been eating or drinking much.  She denies fevers, chills.  [facility] ED Course  Labs/Imaging on admission: I have personally reviewed following labs and imaging studies.  Initial vitals showed BP 134/96, pulse 61, RR 15, temp 97.8 F, SpO2 100% on room air.  Labs show WBC 7.1, hemoglobin 13.2, platelets 220,000, sodium 133, potassium 4.6, bicarb 25, BUN 17, creatinine 1.35, serum glucose 90, lipase <10, lactic acid 0.9.  UA negative for UTI.  CTA abdomen/pelvis negative for evidence for acute mesenteric ischemia.  Mild approximately 20% stenosis at the origin of the left renal artery secondary to calcified plaque noted.  Persistent diffuse mesenteric edema with small volume free fluid within the mesentery and along the peritoneal reflection is noted.  No signs of abscess or pneumoperitoneum.  Unchanged small hiatal hernia.  Small hiatal hernia, unchanged.  Patient was given 1 L LR, IV Dilaudid 1 mg and IV fentanyl 50 mcg.  Patient was started on her chronic home medications.  The hospitalist service was  consulted to admit for further evaluation and management.  Review of Systems: All systems reviewed and are negative except as documented in history of present illness above.   Past Medical History:  Diagnosis Date   Chronic combined systolic and diastolic heart failure ([place]) 08/26/2022   Dyspnea    diastolic dysfunction by echo 2012   GERD (gastroesophageal reflux disease)    Hypertension    Mild mitral and aortic regurgitation    Osteoarthritis    Presence of permanent cardiac pacemaker    Right bundle branch block 10/06/2012   Thyroid disease     Past Surgical History:  Procedure Laterality Date   arm surgery     following an accident   CARDIOVERSION N/A 12/11/2021   Procedure: CARDIOVERSION;  Surgeon: Werner Lean, MD;  Location: [facility];  Service: Cardiovascular;  Laterality: N/A;   CARDIOVERSION N/A 05/22/2022   Procedure: CARDIOVERSION;  Surgeon: [name], MD;  Location: [facility];  Service: Cardiovascular;  Laterality: N/A;   CATARACT EXTRACTION, BILATERAL  2018   CHOLECYSTECTOMY N/A 10/30/2022   Procedure: LAPAROSCOPIC CHOLECYSTECTOMY;  Surgeon: Clovis Riley, MD;  Location: [facility];  Service: General;  Laterality: N/A;   [place]     Following an accident   PACEMAKER IMPLANT N/A 05/27/2022   Procedure: PACEMAKER IMPLANT;  Surgeon: Constance Haw, MD;  Location: [facility];  Service: Cardiovascular;  Laterality: N/A;   TEE WITHOUT CARDIOVERSION N/A 12/11/2021   Procedure: TRANSESOPHAGEAL ECHOCARDIOGRAM (TEE);  Surgeon: Werner Lean, MD;  Location: [facility];  Service: Cardiovascular;  Laterality: N/A;   TONSILLECTOMY AND ADENOIDECTOMY  UMBILICAL HERNIA REPAIR N/A 10/30/2022   Procedure: UMBILICAL HERNIA REPAIR;  Surgeon: Clovis Riley, MD;  Location: [facility];  Service: General;  Laterality: N/A;   VAGINAL HYSTERECTOMY      Social History:  reports that she has never smoked. She has  never used smokeless tobacco. She reports that she does not drink alcohol and does not use drugs.  Allergies  Allergen Reactions   Atropine Other (See Comments)    Made her entire body jump after they gave it to her.   Miralax [Polyethylene Glycol] Other (See Comments)    17g dose caused stomach troubles for 3 days    Prozac [Fluoxetine] Other (See Comments)    Hyponatremia    Serotonin Reuptake Inhibitors (Ssris) Other (See Comments)    Hyponatremia    Keflex [Cephalexin] Other (See Comments)    Unknown reaction Tolerated Cefdinir 05/2022    Family History  Problem Relation Age of Onset   Heart attack Mother    Heart attack Father      Prior to Admission medications   Medication Sig Start Date End Date Taking? Authorizing Provider  acetaminophen (TYLENOL) 500 MG tablet Take 1,000 mg by mouth every 6 (six) hours as needed for mild pain.   Yes [provider]  amiodarone (PACERONE) 200 MG tablet Take 1 tablet (200 mg total) by mouth daily. 03/13/22  Yes Loel Dubonnet, NP  apixaban (ELIQUIS) 5 MG TABS tablet Take 1 tablet (5 mg total) by mouth 2 (two) times daily. 11/06/22  Yes Camnitz, Will Hassell Done, MD  benzonatate (TESSALON PERLES) 100 MG capsule Take 1 capsule (100 mg total) by mouth 3 (three) times daily as needed for cough. 01/14/22  Yes Loel Dubonnet, NP  carboxymethylcellulose (REFRESH PLUS) 0.5 % SOLN Place 1 drop into both eyes daily as needed (dry eyes).   Yes [provider]  Cholecalciferol (VITAMIN D-3 PO) Take 1 capsule by mouth daily with lunch.   Yes [provider]  Cyanocobalamin (VITAMIN B-12 PO) Take 1 tablet by mouth daily with lunch.   Yes [provider]  Dextromethorphan HBr (DELSYM PO) Take 10 mLs by mouth 2 (two) times daily as needed (cough).   Yes [provider]  famotidine (PEPCID) 20 MG tablet Take 1 tablet (20 mg total) by mouth at bedtime. 10/23/22  Yes Freddi Starr, MD  fluticasone (FLONASE) 50  MCG/ACT nasal spray Place 1 spray into both nostrils daily.   Yes [provider]  furosemide (LASIX) 20 MG tablet Take '40mg'$  (2 tablets) daily. May take additional '20mg'$  (1 tablet) as needed for weight gain of 2 pounds overnight or 5 pounds in one week Patient taking differently: Take 20 mg by mouth daily. 03/13/22  Yes Loel Dubonnet, NP  ipratropium (ATROVENT) 0.06 % nasal spray Place 1 spray into both nostrils 2 (two) times daily. 09/11/22  Yes [provider]  ketoconazole (NIZORAL) 2 % cream Apply 1 Application topically 2 (two) times daily as needed for rash. 03/04/22  Yes [provider]  levothyroxine (SYNTHROID) 200 MCG tablet Take 200 mcg by mouth daily.   Yes [provider]  levothyroxine (SYNTHROID) 25 MCG tablet Take 25 mcg by mouth daily.   Yes [provider]  LORazepam (ATIVAN) 0.5 MG tablet Take 0.5 mg by mouth at bedtime.   Yes [provider]  lovastatin (MEVACOR) 40 MG tablet Take 40 mg by mouth daily with supper.   Yes [provider]  metoprolol succinate (TOPROL-XL)  25 MG 24 hr tablet Take 0.5 tablets (12.5 mg total) by mouth daily. 10/02/22  Yes Loel Dubonnet, NP  omeprazole (PRILOSEC) 10 MG capsule Take 10 mg by mouth every morning. 10/16/21  Yes [provider]  potassium chloride SA (KLOR-CON M) 10 MEQ tablet Take 2 tablets (20 mEq total) by mouth daily. 11/20/22  Yes Loel Dubonnet, NP  sacubitril-valsartan (ENTRESTO) 24-26 MG Take 1 tablet by mouth 2 (two) times daily. 09/25/22  Yes [name], MD  spironolactone (ALDACTONE) 25 MG tablet Take 0.5 tablets (12.5 mg total) by mouth daily. 06/06/22 06/01/23 Yes Loel Dubonnet, NP  Thiamine HCl (THIAMINE PO) Take 1 tablet by mouth daily with lunch. Vitamin B-1, unknown strength.   Yes [provider]  triamcinolone cream (KENALOG) 0.1 % Apply 1 Application topically 2 (two) times daily as needed (flare ups).   Yes [provider]  fluticasone-salmeterol (WIXELA INHUB) 100-50 MCG/ACT AEPB Inhale 1 puff into the lungs 2 (two) times daily. Patient not taking: Reported on 11/17/2022 10/24/22   Parrett, Fonnie Mu, NP  Multiple Vitamins-Minerals (EYE VITAMINS) CAPS Take 1 capsule by mouth daily with lunch. Patient not taking: Reported on 12/06/2022    [provider]  polyethylene glycol (MIRALAX) 17 g packet Take 17 g by mouth daily. Patient not taking: Reported on 10/28/2022 10/07/22   Osvaldo Shipper, [place]    Physical Exam: Vitals:   12/06/22 1554 12/06/22 1641 12/06/22 1904 12/06/22 1931  BP:  (!) 137/47 (!) 130/55   Pulse:  61 63   Resp:  16 18   Temp:   99.7 F (37.6 C)   TempSrc:   Oral   SpO2: 100% 100% 95% 94%   Constitutional: Elderly woman resting supine in bed.  NAD, calm Eyes: EOMI, lids and conjunctivae normal ENMT: Mucous membranes are dry. Posterior pharynx clear of any exudate or lesions.Normal dentition.  Neck: normal, supple, no masses. Respiratory: clear to auscultation bilaterally, no wheezing, no crackles. Normal respiratory effort. No accessory muscle use.  Cardiovascular: Regular rate and rhythm, no murmurs / rubs / gallops. No extremity edema. 2+ pedal pulses. Abdomen: Generalized tenderness to light palpation. Musculoskeletal: no clubbing / cyanosis. No joint deformity upper and lower extremities. Good ROM, no contractures. Normal muscle tone.  Skin: no rashes, lesions, ulcers. No induration Neurologic: Sensation intact. Strength 5/5 in all 4.  Psychiatric: Alert and oriented x 3. Normal mood.   EKG: Not performed.  Assessment/Plan Principal Problem:   Intractable abdominal pain Active Problems:   Metastatic adenocarcinoma of unknown origin (HCC)   Paroxysmal atrial fibrillation (HCC)   (HFpEF) heart failure with preserved ejection fraction (HCC)   Chronic kidney disease, stage 3b (HCC)   Hypothyroidism   HLD (hyperlipidemia)   [name] is a 89 y.o. 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  Assessment and Plan: Intractable generalized abdominal pain with nausea: Unclear etiology, potentially cancer associated pain.  CTA abdomen/pelvis were negative for acute etiology.  Does show persistent diffuse mesenteric edema.  No evidence of mesenteric ischemia or bowel obstruction.  She has had some relief with Dilaudid in the ED.  Also reports neuropathic type pain. -Stepwise pain control with Tylenol, Norco, IV Dilaudid only as needed -Senokot-S prn for bowel regimen (daughter states  she is very sensitive to stool softeners/laxatives)  Metastatic adenocarcinoma involving umbilical hernia sac and omentum: Suspicion is for ovarian primary versus primary peritoneal carcinoma.  She is following with oncology, Dr. Benay Spice.  Considering hormonal therapy since tumor is ER positive.  Paroxysmal atrial fibrillation s/p PPM: Stable.  Continue amiodarone, Toprol-XL, and Eliquis.  Heart failure with preserved ejection fraction: Appears euvolemic.  Continue Entresto, Toprol-XL, spironolactone, Lasix.  CKD stage IIIb: Stable.  Hypothyroidism: Continue Synthroid.  Anxiety: Continue home Ativan 0.5 mg nightly.   DVT prophylaxis:  apixaban (ELIQUIS) tablet 5 mg   Code Status: DNR, confirmed with patient on admission Family Communication: Daughter at bedside Disposition Plan: Home and likely discharge to home pending clinical progress Consults called: None Severity of Illness: The appropriate patient status for this patient is OBSERVATION. Observation status is judged to be reasonable and necessary in order to provide the required intensity of service to ensure the patient's safety. The  patient's presenting symptoms, physical exam findings, and initial radiographic and laboratory data in the context of their medical condition is felt to place them at decreased risk for further clinical deterioration. Furthermore, it is anticipated that the patient will be medically stable for discharge from the hospital within 2 midnights of admission.   Zada Finders MD [facility]  If 7PM-7AM, please contact night-coverage [email or web address]  12/06/2022, 7:43 PM

## 2022-12-06 NOTE — ED Notes (Signed)
Pt going to CT

## 2022-12-06 NOTE — ED Notes (Signed)
Pt provided warm blankets, family at bedside. Call light in reach.

## 2022-12-06 NOTE — ED Notes (Signed)
She tells me she does not wish to receive Zofran as she "took it right before I left home".

## 2022-12-06 NOTE — ED Notes (Signed)
As I write this she is still in CT.

## 2022-12-06 NOTE — ED Triage Notes (Signed)
She c/o low abd. Pain. She is in no distress and her daughter is with her.

## 2022-12-06 NOTE — ED Notes (Signed)
Pt provided pain medication and bear hugger for comfort. Call light in reach and family at bedside will continue with plan of care.

## 2022-12-06 NOTE — ED Provider Notes (Signed)
[facility] Provider Note   CSN: [id number] Arrival date & time: 12/06/22  [id number]     History  Chief Complaint  Patient presents with   Abdominal Pain    [name] is a 89 y.o. female.  HPI    89 y.o. female with medical history significant of chronic combined syst and diastolic CHF, HTN, CKD stage III, Afib s/p PPM, GERD, hx of incarcerated umbilical hernia who was to have repair of hernia by general surgery, Dr. Kae Heller on Friday, 10/31/2022 who presents to the emergency department with sudden onset severe abdominal pain with associated nausea.  The pain is located in the bilateral lower abdomen, nonradiating.  The patient had a last bowel movement yesterday.  She has had no episodes of emesis.  She is not certain if she is passing gas.  She denies any fevers and chills or urinary symptoms.  She took Zofran for nausea prior to arrival.  Home Medications Prior to Admission medications   Medication Sig Start Date End Date Taking? Authorizing Provider  acetaminophen (TYLENOL) 500 MG tablet Take 1,000 mg by mouth every 6 (six) hours as needed for mild pain.    [provider]  amiodarone (PACERONE) 200 MG tablet Take 1 tablet (200 mg total) by mouth daily. 03/13/22   Loel Dubonnet, NP  apixaban (ELIQUIS) 5 MG TABS tablet Take 1 tablet (5 mg total) by mouth 2 (two) times daily. 11/06/22   Camnitz, Ocie Doyne, MD  benzonatate (TESSALON PERLES) 100 MG capsule Take 1 capsule (100 mg total) by mouth 3 (three) times daily as needed for cough. 01/14/22   Loel Dubonnet, NP  carboxymethylcellulose (REFRESH PLUS) 0.5 % SOLN Place 1 drop into both eyes daily as needed (dry eyes).    [provider]  Cholecalciferol (VITAMIN D-3 PO) Take 1 capsule by mouth daily with lunch.    [provider]  Cyanocobalamin (VITAMIN B-12 PO) Take 1 tablet by mouth daily with lunch.    [provider]  Dextromethorphan HBr (DELSYM  PO) Take 10 mLs by mouth 2 (two) times daily as needed (cough).    [provider]  famotidine (PEPCID) 20 MG tablet Take 1 tablet (20 mg total) by mouth at bedtime. 10/23/22   Freddi Starr, MD  fluticasone (FLONASE) 50 MCG/ACT nasal spray Place 1 spray into both nostrils daily.    [provider]  fluticasone-salmeterol (WIXELA INHUB) 100-50 MCG/ACT AEPB Inhale 1 puff into the lungs 2 (two) times daily. Patient not taking: Reported on 11/17/2022 10/24/22   Parrett, Fonnie Mu, NP  furosemide (LASIX) 20 MG tablet Take '40mg'$  (2 tablets) daily. May take additional '20mg'$  (1 tablet) as needed for weight gain of 2 pounds overnight or 5 pounds in one week Patient taking differently: Take 20 mg by mouth daily. 03/13/22   Loel Dubonnet, NP  ipratropium (ATROVENT) 0.06 % nasal spray Place 1 spray into both nostrils 2 (two) times daily. Patient not taking: Reported on 11/17/2022 09/11/22   [provider]  ketoconazole (NIZORAL) 2 % cream Apply 1 Application topically 2 (two) times daily as needed for rash. 03/04/22   [provider]  levothyroxine (SYNTHROID) 200 MCG tablet Take 200 mcg by mouth daily.    [provider]  levothyroxine (SYNTHROID) 25 MCG tablet Take 25 mcg by mouth daily.    [provider]  LORazepam (ATIVAN) 0.5 MG tablet Take 0.5 mg by mouth at bedtime.  [provider]  lovastatin (MEVACOR) 40 MG tablet Take 40 mg by mouth daily with supper.    [provider]  metoprolol succinate (TOPROL-XL) 25 MG 24 hr tablet Take 0.5 tablets (12.5 mg total) by mouth daily. 10/02/22   Loel Dubonnet, NP  Multiple Vitamins-Minerals (EYE VITAMINS) CAPS Take 1 capsule by mouth daily with lunch.    [provider]  omeprazole (PRILOSEC) 10 MG capsule Take 10 mg by mouth every morning. 10/16/21   [provider]  polyethylene glycol (MIRALAX) 17 g packet Take 17 g by mouth daily. Patient not taking: Reported on  10/28/2022 10/07/22   Small, Brooke L, PA  potassium chloride SA (KLOR-CON M) 10 MEQ tablet Take 2 tablets (20 mEq total) by mouth daily. 11/20/22   Loel Dubonnet, NP  sacubitril-valsartan (ENTRESTO) 24-26 MG Take 1 tablet by mouth 2 (two) times daily. 09/25/22   [name], MD  spironolactone (ALDACTONE) 25 MG tablet Take 0.5 tablets (12.5 mg total) by mouth daily. 06/06/22 06/01/23  Loel Dubonnet, NP  Thiamine HCl (THIAMINE PO) Take 1 tablet by mouth daily with lunch. Vitamin B-1, unknown strength.    [provider]  triamcinolone cream (KENALOG) 0.1 % Apply 1 Application topically 2 (two) times daily as needed (flare ups).    [provider]      Allergies    Atropine, Miralax [polyethylene glycol], Prozac [fluoxetine], Serotonin reuptake inhibitors (ssris), and Keflex [cephalexin]    Review of Systems   Review of Systems  All other systems reviewed and are negative.   Physical Exam Updated Vital Signs BP (!) 135/58   Pulse (!) 59   Temp 97.8 F (36.6 C) (Oral)   Resp 16   SpO2 100%  Physical Exam Vitals and nursing note reviewed.  Constitutional:      General: She is not in acute distress.    Appearance: She is well-developed.  HENT:     Head: Normocephalic and atraumatic.  Eyes:     Conjunctiva/sclera: Conjunctivae normal.  Cardiovascular:     Rate and Rhythm: Normal rate and regular rhythm.     Heart sounds: No murmur heard. Pulmonary:     Effort: Pulmonary effort is normal. No respiratory distress.     Breath sounds: Normal breath sounds.  Abdominal:     Palpations: Abdomen is soft.     Tenderness: There is abdominal tenderness in the right lower quadrant, suprapubic area and left lower quadrant. There is guarding and rebound. There is no right CVA tenderness or left CVA tenderness.  Musculoskeletal:        General: No swelling.     Cervical back: Neck supple.  Skin:    General: Skin is warm and dry.     Capillary Refill: Capillary  refill takes less than 2 seconds.  Neurological:     Mental Status: She is alert.  Psychiatric:        Mood and Affect: Mood normal.     ED Results / Procedures / Treatments   Labs (all labs ordered are listed, but only abnormal results are displayed) Labs Reviewed  LIPASE, BLOOD - Abnormal; Notable for the following components:      Result Value   Lipase <10 (*)    All other components within normal limits  COMPREHENSIVE METABOLIC PANEL - Abnormal; Notable for the following components:   Sodium 133 (*)    Chloride 97 (*)    Creatinine, Ser 1.35 (*)    AST 13 (*)  GFR, Estimated 38 (*)    All other components within normal limits  URINALYSIS, ROUTINE W REFLEX MICROSCOPIC - Abnormal; Notable for the following components:   Ketones, ur TRACE (*)    All other components within normal limits  CBC  LACTIC ACID, PLASMA    EKG None  Radiology CT Angio Abd/Pel W and/or Wo Contrast  Result Date: 12/06/2022 CLINICAL DATA:  Evaluate for acute mesenteric ischemia. Follow-up prior CT. EXAM: CTA ABDOMEN AND PELVIS WITHOUT AND WITH CONTRAST TECHNIQUE: Multidetector CT imaging of the abdomen and pelvis was performed using the standard protocol during bolus administration of intravenous contrast. Multiplanar reconstructed images and MIPs were obtained and reviewed to evaluate the vascular anatomy. RADIATION DOSE REDUCTION: This exam was performed according to the departmental dose-optimization program which includes automated exposure control, adjustment of the mA and/or kV according to patient size and/or use of iterative reconstruction technique. CONTRAST:  60m OMNIPAQUE IOHEXOL 350 MG/ML SOLN COMPARISON:  12/06/2022 FINDINGS: VASCULAR Aorta: Aortic atherosclerotic calcifications. Normal caliber aorta without aneurysm, dissection, vasculitis or significant stenosis. Celiac: Patent without evidence of aneurysm, dissection, vasculitis or significant stenosis. SMA: Patent without evidence of  aneurysm, dissection, vasculitis or significant stenosis. Renals: Mild, approximately 20% stenosis at the origin of the left renal artery secondary to calcified plaque. Right renal artery is patent. IMA: Patent without evidence of aneurysm, dissection, vasculitis or significant stenosis. Inflow: Patent without evidence of aneurysm, dissection, vasculitis or significant stenosis. Proximal Outflow: Bilateral common femoral and visualized portions of the superficial and profunda femoral arteries are patent without evidence of aneurysm, dissection, vasculitis or significant stenosis. Veins: No obvious venous abnormality within the limitations of this arterial phase study. Review of the MIP images confirms the above findings. NON-VASCULAR Lower chest: No acute abnormality. Hepatobiliary: No suspicious enhancing liver lesion identified. Status post cholecystectomy. No bile duct dilatation identified. Pancreas: Unremarkable. No pancreatic ductal dilatation or surrounding inflammatory changes. Spleen: Normal in size without focal abnormality. Adrenals/Urinary Tract: Normal adrenal glands. Unchanged simple appearing right kidney cyst measuring 5.5 cm. No follow-up imaging recommended. No nephrolithiasis, hydronephrosis or suspicious mass. Bladder is unremarkable. Stomach/Bowel: Unchanged small hiatal hernia. Stomach is otherwise within normal limits. No pathologic dilatation of the large or small bowel loops to suggest a bowel obstruction. Colonic diverticulosis. No bowel wall thickening, inflammation, or distension. No signs of pneumatosis. Lymphatic: There is no abdominopelvic adenopathy. No inguinal adenopathy. Reproductive: Status post hysterectomy. No adnexal masses. Other: Status post umbilical hernia. Stable from the prior exam. Diffuse mesenteric edema is again seen as noted previously. Small volume scab sat scattered areas of free fluid noted within the mesentery and along the peritoneal reflections. No focal fluid  collections to suggest abscess. No pneumoperitoneum. Musculoskeletal: Degenerative disc disease. No acute or suspicious osseous findings. IMPRESSION: 1. No evidence for acute mesenteric ischemia. 2. Mild, approximately 20% stenosis at the origin of the left renal artery secondary to calcified plaque. 3. Persistent diffuse mesenteric edema with small volume of free fluid within the mesentery and along the peritoneal reflections. Findings are nonspecific. 4. No signs of abscess or pneumoperitoneum. 5. Unchanged small hiatal hernia. 6. Status post umbilical hernia repair. 7.  Aortic Atherosclerosis (ICD10-I70.0). Electronically Signed   By: TKerby MoorsM.D.   On: 12/06/2022 14:29   CT ABDOMEN PELVIS WO CONTRAST  Result Date: 12/06/2022 CLINICAL DATA:  Lower abdominal pain. EXAM: CT ABDOMEN AND PELVIS WITHOUT CONTRAST TECHNIQUE: Multidetector CT imaging of the abdomen and pelvis was performed following the standard protocol without IV contrast. RADIATION  DOSE REDUCTION: This exam was performed according to the departmental dose-optimization program which includes automated exposure control, adjustment of the mA and/or kV according to patient size and/or use of iterative reconstruction technique. COMPARISON:  CT abdomen pelvis dated October 27, 2022. FINDINGS: Lower chest: No acute abnormality. Hepatobiliary: No focal liver abnormality is seen. Status post cholecystectomy. No biliary dilatation. Pancreas: Atrophic. No ductal dilatation or surrounding inflammatory changes. Spleen: Normal in size without focal abnormality. Adrenals/Urinary Tract: Adrenal glands are unremarkable. Unchanged 5.3 cm right renal simple cyst. No follow-up imaging is recommended. No renal calculi or hydronephrosis. Small amount of excreted contrast in both renal collecting systems and the bladder. The bladder is otherwise unremarkable. Stomach/Bowel: Unchanged small hiatal hernia. The stomach is otherwise within normal limits. No bowel wall  thickening, distention, or surrounding inflammatory changes. Severe sigmoid colonic diverticulosis. Vascular/Lymphatic: Aortic atherosclerosis. No enlarged abdominal or pelvic lymph nodes. Reproductive: Status post hysterectomy. No adnexal masses. Other: Interval umbilical hernia repair without recurrent hernia. Slightly increased trace free fluid and mesenteric fat stranding in the pelvis. No pneumoperitoneum. Musculoskeletal: No acute or significant osseous findings. IMPRESSION: 1. No acute intra-abdominal process.  No bowel obstruction. 2. Slightly increased trace free fluid and mesenteric fat stranding in the pelvis, nonspecific. 3. Interval umbilical hernia repair without recurrent hernia. 4.  Aortic Atherosclerosis (ICD10-I70.0). Electronically Signed   By: Titus Dubin M.D.   On: 12/06/2022 12:40    Procedures Procedures    Medications Ordered in ED Medications  lactated ringers bolus 1,000 mL (1,000 mLs Intravenous New Bag/Given 12/06/22 1055)  fentaNYL (SUBLIMAZE) injection 50 mcg (50 mcg Intravenous Given 12/06/22 1058)  ondansetron (ZOFRAN) injection 4 mg (4 mg Intravenous Given 12/06/22 1411)  iohexol (OMNIPAQUE) 300 MG/ML solution 100 mL (60 mLs Intravenous Contrast Given 12/06/22 1131)  HYDROmorphone (DILAUDID) injection 1 mg (1 mg Intravenous Given 12/06/22 1324)  iohexol (OMNIPAQUE) 350 MG/ML injection 100 mL (80 mLs Intravenous Contrast Given 12/06/22 1359)    ED Course/ Medical Decision Making/ A&P                             Medical Decision Making Amount and/or Complexity of Data Reviewed Labs: ordered. Radiology: ordered.  Risk Prescription drug management.     89 y.o. female with medical history significant of chronic combined syst and diastolic CHF, HTN, CKD stage III, Afib s/p PPM, GERD, hx of incarcerated umbilical hernia who was to have repair of hernia by general surgery, Dr. Kae Heller on Friday, 10/31/2022 who presents to the emergency department with sudden onset severe  abdominal pain with associated nausea.  The pain is located in the bilateral lower abdomen, nonradiating.  The patient had a last bowel movement yesterday.  She has had no episodes of emesis.  She is not certain if she is passing gas.  She denies any fevers and chills or urinary symptoms.  She took Zofran for nausea prior to arrival.   Vitals and telemetry on arrival: On arrival, the patient was afebrile, hemodynamically stable, not tachycardic or tachypneic, saturating well on room air.  Sinus rhythm noted on cardiac telemetry  Pertinent exam findings include: Generalized abdominal tenderness to palpation with guarding present, mild rebound tenderness present, some focality in the bilateral lower quadrants.  Differential Diagnosis: Worsening pain associated with the patient's metastatic cancer, appendicitis, Bowel Obstruction, AAA, Pyelonephritis, Nephrolithiasis, Pancreatitis, Shingles, Perforated Bowel or Ulcer, Diverticulosis/itis, Ischemic Mesentery, Inflammatory Bowel Disease, Strangulated/Incarcerated Hernia, gastritis, PUD.   Lab results include:  Lactic acid 0.9, urinalysis without evidence of UTI or hematuria, CMP without significant electrolyte abnormality, mildly elevated serum creatinine but at baseline at 1.35, normal liver function, CBC without a leukocytosis or anemia, lipase normal  Imaging results include:  CT Abdomen Pelvis WO: IMPRESSION:  1. No acute intra-abdominal process.  No bowel obstruction.  2. Slightly increased trace free fluid and mesenteric fat stranding  in the pelvis, nonspecific.  3. Interval umbilical hernia repair without recurrent hernia.  4.  Aortic Atherosclerosis (ICD10-I70.0).    CTA Abdomen Pelvis: IMPRESSION:  1. No evidence for acute mesenteric ischemia.  2. Mild, approximately 20% stenosis at the origin of the left renal  artery secondary to calcified plaque.  3. Persistent diffuse mesenteric edema with small volume of free  fluid within the  mesentery and along the peritoneal reflections.  Findings are nonspecific.  4. No signs of abscess or pneumoperitoneum.  5. Unchanged small hiatal hernia.  6. Status post umbilical hernia repair.  7.  Aortic Atherosclerosis (ICD10-I70.0).    Course of tx has consisted of: Patient was administered fentanyl and then required escalation to Dilaudid due to persistent worsening severe pain in her abdomen.  She was administered an IV fluid bolus.  She was administered IV Zofran for nausea.  Thought process: Patient CT imaging did not reveal acute findings other than evidence of inflammation of the patient's mesentery and she has evidence of metastatic cancer in that area.  Patient was persistent pain despite IV fentanyl and Dilaudid.  Hospitalist medicine consulted for admission in the setting of concern for metastatic ovarian cancer.   Dr. Horris Latino cleared and accepted the patient in admission.  She requested we consult oncology for inpatient recommendations.   Final Clinical Impression(s) / ED Diagnoses Final diagnoses:  Metastatic malignant neoplasm, unspecified site [facility])  Generalized abdominal pain    Rx / DC Orders ED Discharge Orders     None         Regan Lemming, MD 12/06/22 1504

## 2022-12-06 NOTE — Progress Notes (Signed)
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  Patient is followed by Dr. Benay Spice and was recently seen 10/2022.  Vital signs and labs fairly stable.  Patient was given pain management and [facility] without any significant relief.  Requesting admission for further pain management.  I asked EDP to consult oncology.  Patient will be admitted to a MedSurg bed here at [facility]

## 2022-12-06 NOTE — ED Notes (Signed)
Pt in CT.

## 2022-12-06 NOTE — ED Notes (Signed)
Patient transported to CT 

## 2022-12-06 NOTE — Hospital Course (Signed)
[name] is a 89 y.o. 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

## 2022-12-07 DIAGNOSIS — R110 Nausea: Secondary | ICD-10-CM | POA: Diagnosis not present

## 2022-12-07 DIAGNOSIS — Z515 Encounter for palliative care: Secondary | ICD-10-CM | POA: Diagnosis not present

## 2022-12-07 DIAGNOSIS — I130 Hypertensive heart and chronic kidney disease with heart failure and stage 1 through stage 4 chronic kidney disease, or unspecified chronic kidney disease: Secondary | ICD-10-CM | POA: Diagnosis not present

## 2022-12-07 DIAGNOSIS — E871 Hypo-osmolality and hyponatremia: Secondary | ICD-10-CM | POA: Diagnosis not present

## 2022-12-07 DIAGNOSIS — I495 Sick sinus syndrome: Secondary | ICD-10-CM | POA: Diagnosis not present

## 2022-12-07 DIAGNOSIS — E039 Hypothyroidism, unspecified: Secondary | ICD-10-CM | POA: Diagnosis not present

## 2022-12-07 DIAGNOSIS — R1084 Generalized abdominal pain: Secondary | ICD-10-CM | POA: Diagnosis not present

## 2022-12-07 DIAGNOSIS — C569 Malignant neoplasm of unspecified ovary: Secondary | ICD-10-CM | POA: Diagnosis not present

## 2022-12-07 DIAGNOSIS — I700 Atherosclerosis of aorta: Secondary | ICD-10-CM | POA: Diagnosis not present

## 2022-12-07 DIAGNOSIS — I5042 Chronic combined systolic (congestive) and diastolic (congestive) heart failure: Secondary | ICD-10-CM | POA: Diagnosis not present

## 2022-12-07 DIAGNOSIS — E669 Obesity, unspecified: Secondary | ICD-10-CM | POA: Diagnosis not present

## 2022-12-07 DIAGNOSIS — I480 Paroxysmal atrial fibrillation: Secondary | ICD-10-CM | POA: Diagnosis not present

## 2022-12-07 DIAGNOSIS — R338 Other retention of urine: Secondary | ICD-10-CM | POA: Diagnosis not present

## 2022-12-07 DIAGNOSIS — I080 Rheumatic disorders of both mitral and aortic valves: Secondary | ICD-10-CM | POA: Diagnosis not present

## 2022-12-07 DIAGNOSIS — G893 Neoplasm related pain (acute) (chronic): Secondary | ICD-10-CM | POA: Diagnosis not present

## 2022-12-07 DIAGNOSIS — C799 Secondary malignant neoplasm of unspecified site: Secondary | ICD-10-CM | POA: Diagnosis not present

## 2022-12-07 DIAGNOSIS — Z7189 Other specified counseling: Secondary | ICD-10-CM | POA: Diagnosis not present

## 2022-12-07 DIAGNOSIS — C481 Malignant neoplasm of specified parts of peritoneum: Secondary | ICD-10-CM | POA: Diagnosis not present

## 2022-12-07 DIAGNOSIS — C786 Secondary malignant neoplasm of retroperitoneum and peritoneum: Secondary | ICD-10-CM | POA: Diagnosis not present

## 2022-12-07 DIAGNOSIS — N1832 Chronic kidney disease, stage 3b: Secondary | ICD-10-CM | POA: Diagnosis not present

## 2022-12-07 DIAGNOSIS — Z794 Long term (current) use of insulin: Secondary | ICD-10-CM | POA: Diagnosis not present

## 2022-12-07 DIAGNOSIS — N179 Acute kidney failure, unspecified: Secondary | ICD-10-CM | POA: Diagnosis not present

## 2022-12-07 DIAGNOSIS — N390 Urinary tract infection, site not specified: Secondary | ICD-10-CM | POA: Diagnosis not present

## 2022-12-07 DIAGNOSIS — Z66 Do not resuscitate: Secondary | ICD-10-CM | POA: Diagnosis not present

## 2022-12-07 DIAGNOSIS — E440 Moderate protein-calorie malnutrition: Secondary | ICD-10-CM | POA: Diagnosis not present

## 2022-12-07 DIAGNOSIS — C801 Malignant (primary) neoplasm, unspecified: Secondary | ICD-10-CM | POA: Diagnosis not present

## 2022-12-07 DIAGNOSIS — E785 Hyperlipidemia, unspecified: Secondary | ICD-10-CM | POA: Diagnosis not present

## 2022-12-07 DIAGNOSIS — Z48815 Encounter for surgical aftercare following surgery on the digestive system: Secondary | ICD-10-CM | POA: Diagnosis not present

## 2022-12-07 DIAGNOSIS — R109 Unspecified abdominal pain: Secondary | ICD-10-CM | POA: Diagnosis not present

## 2022-12-07 DIAGNOSIS — B965 Pseudomonas (aeruginosa) (mallei) (pseudomallei) as the cause of diseases classified elsewhere: Secondary | ICD-10-CM | POA: Diagnosis not present

## 2022-12-07 LAB — BASIC METABOLIC PANEL
Anion gap: 10 (ref 5–15)
BUN: 20 mg/dL (ref 8–23)
CO2: 22 mmol/L (ref 22–32)
Calcium: 8.5 mg/dL — ABNORMAL LOW (ref 8.9–10.3)
Chloride: 101 mmol/L (ref 98–111)
Creatinine, Ser: 1.45 mg/dL — ABNORMAL HIGH (ref 0.44–1.00)
GFR, Estimated: 34 mL/min — ABNORMAL LOW (ref >60)
Glucose, Bld: 90 mg/dL (ref 70–99)
Potassium: 4.7 mmol/L (ref 3.5–5.1)
Sodium: 133 mmol/L — ABNORMAL LOW (ref 135–145)

## 2022-12-07 LAB — CBC
HCT: 36.5 % (ref 36.0–46.0)
Hemoglobin: 11.3 g/dL — ABNORMAL LOW (ref 12.0–15.0)
MCH: 28 pg (ref 26.0–34.0)
MCHC: 31 g/dL (ref 30.0–36.0)
MCV: 90.6 fL (ref 80.0–100.0)
Platelets: 253 10*3/uL (ref 150–400)
RBC: 4.03 MIL/uL (ref 3.87–5.11)
RDW: 14.6 % (ref 11.5–15.5)
WBC: 9.1 10*3/uL (ref 4.0–10.5)
nRBC: 0 % (ref 0.0–0.2)

## 2022-12-07 NOTE — Plan of Care (Signed)
Patient AOX4, VSS throughout shift.  All meds given on time as ordered.  Pt c/o abd pain relieved by PRN pain meds.  Diminished lungs, IS encouraged.  Purewick in place.  POC maintained, will continue to monitor.  Problem: Education: Goal: Knowledge of General Education information will improve Description: Including pain rating scale, medication(s)/side effects and non-pharmacologic comfort measures Outcome: Progressing   Problem: Health Behavior/Discharge Planning: Goal: Ability to manage health-related needs will improve Outcome: Progressing   Problem: Clinical Measurements: Goal: Ability to maintain clinical measurements within normal limits will improve Outcome: Progressing Goal: Will remain free from infection Outcome: Progressing Goal: Diagnostic test results will improve Outcome: Progressing Goal: Respiratory complications will improve Outcome: Progressing Goal: Cardiovascular complication will be avoided Outcome: Progressing   Problem: Activity: Goal: Risk for activity intolerance will decrease Outcome: Progressing   Problem: Nutrition: Goal: Adequate nutrition will be maintained Outcome: Progressing   Problem: Coping: Goal: Level of anxiety will decrease Outcome: Progressing   Problem: Elimination: Goal: Will not experience complications related to bowel motility Outcome: Progressing Goal: Will not experience complications related to urinary retention Outcome: Progressing   Problem: Pain Managment: Goal: General experience of comfort will improve Outcome: Progressing   Problem: Safety: Goal: Ability to remain free from injury will improve Outcome: Progressing   Problem: Skin Integrity: Goal: Risk for impaired skin integrity will decrease Outcome: Progressing

## 2022-12-07 NOTE — Progress Notes (Signed)
PROGRESS NOTE   [name]  [id number] DOB: [birth year] DOA: 12/06/2022 PCP: Deon Pilling, NP  Brief Narrative:  33 Y female recent diagnosis abdominal pain at ED visit 10/07/2022 Surgery 10/31/2022 Dr. Windle Guard for incarcerated about her hernia hernia sac = metastatic moderately differentiated adeno CA omentum biopsy-immunohistochemistry = gynecological primary?  Ovarian-- at last OV Dr. Benay Spice 2/12/24the time she did not wish to consider surgery chemotherapy therapy and declined gyn-onc evaluation - referred to genetics counseling The plan at the time was to get a CA125 around mid February and a restaging CT around March Tamoxifen surgical options chemotherapy were discussed with the patient by Dr. Benay Spice on visit at 11/17/2022 HFrEF/HFpEF (EF 50-55%) followed by Dr. [name] on [place],  Tachybradycardia syndrome CHADVASC at least 5, [employer] PPM 05/27/2022//amiodarone/Eliquis 5 twice daily Mild aortic regurgitation Chronic right bundle CKD 3B Acquired hypothyroidism HLD Anxiety  [facility] 12/[facility]/2022 bilateral lower abdominal pain nonradiating  last BM 3/1 no emesis no fever chills urinary symptoms WBC 7.1 hemoglobin 13.2 platelet 224 potassium 4.6 bicarb 25 BUN/creatinine 17/1.3 lactic acid 0.9 CTA abdomen pelvis no acute ischemia 20% left renal artery stenosis small hiatal hernia unchanged no abscess no pneumoperitoneum Rx Dilaudid IV fluids Fentanyl Home meds   Hospital-Problem based course  Severe abd pain on admit--now mainly nausea-likely secondary to progression of disease? - Tolerating some diet but having some nausea, continue Zofran 4 mg IV every 6 as needed, MiraLAX 17 g daily, Protonix 40 daily - Has not vomited today so allow diet as best able  Adenocarcinoma?  Ovarian versus omental primary - Patient is categorically declining chemotherapy or any further surgery - I have added Dr. Benay Spice to the treatment team to discuss options such as  immunotherapy or tamoxifen to be discussed prior to discharge -we should ask palliative care to step in with Dr. Gearldine Shown oversight to delineate goals of care  HFrEF/HFpEF EF 50-55% Mild aortic regurgitation - Stop Aldactone, Entresto-- can continue low-dose Lasix 20 and watch creatinine - AM labs  Tachybradycardia syndrome CHADVASC >5/[employer] PPM-on amnio Chronic right bundle branch block - Continue amiodarone 200 daily, Toprol-XL 12.5 daily, apixaban 5 twice daily-seems predominantly in sinus rhythm to exam -Obtain magnesium in a.m.  CKD 3B - See above discussion regarding cardiac meds - Outpatient follow-up  Anxiety -Continue Ativan 0.5 at bedtime  Hold statin, dextromethorphan, B12, D3, Tessalon, MVI for now  DVT prophylaxis:  Code Status:  Family Communication:  Disposition:  Status is: Observation The patient will require care spanning > 2 midnights and should be moved to inpatient because:   She will need symptom management and ability to take in p.o. prior to discussion with oncologist tomorrow about next steps in terms of goals-she is categorically stating to me that she does not want chemo does not want surgery so we will see what Dr. Benay Spice has to offer with shared decision making on the patient I have encouraged the daughter to be present early in the morning when the oncologist rounds to have these discussions      Subjective: Somewhat nauseous overnight and some today pain is moderate it is across the lower quadrant of the abdomen no 1 specific place No nausea no vomiting Tolerated p.o. and thinks that she can eat--passing flatus no stool since 2 days however Daughter is present at the bedside No fever  Objective: Vitals:   12/06/22 2015 12/06/22 2339 12/07/22 0313 12/07/22 0657  BP: 128/63 (!) 115/48 (!) 104/39 (!) 109/50  Pulse:  61 66 61 60  Resp: '18 17 15 16  '$ Temp: 98.3 F (36.8 C) 98.7 F (37.1 C) 98.3 F (36.8 C) 97.7 F (36.5 C)  TempSrc:  Oral Oral Oral Oral  SpO2: 97% 95% 91% 91%    Intake/Output Summary (Last 24 hours) at 12/07/2022 0755 Last data filed at 12/07/2022 0315 Gross per 24 hour  Intake 200 ml  Output 340 ml  Net -140 ml   There were no vitals filed for this visit.  Examination:  EOMI NCAT no focal deficit coherent x 4 Neck soft supple Abdomen slight distention no rebound no guarding but is tender to light palpation umbilical scar noted from recent surgery S1-S2 seems to be in sinus No lower extremity edema Moving all 4 limbs equally   Data Reviewed: personally reviewed   CBC    Component Value Date/Time   WBC 7.1 12/06/2022 1019   RBC 4.61 12/06/2022 1019   HGB 13.2 12/06/2022 1019   HGB 13.0 05/15/2022 0944   HCT 40.5 12/06/2022 1019   HCT 42.0 05/15/2022 0944   PLT 220 12/06/2022 1019   PLT 216 05/15/2022 0944   MCV 87.9 12/06/2022 1019   MCV 86 05/15/2022 0944   MCH 28.6 12/06/2022 1019   MCHC 32.6 12/06/2022 1019   RDW 14.7 12/06/2022 1019   RDW 13.8 05/15/2022 0944   LYMPHSABS 1.0 10/29/2022 0414   MONOABS 0.8 10/29/2022 0414   EOSABS 0.3 10/29/2022 0414   BASOSABS 0.0 10/29/2022 0414      Latest Ref Rng & Units 12/06/2022   10:19 AM 11/03/2022    3:58 AM 11/02/2022    8:10 AM  CMP  Glucose 70 - 99 mg/dL 90  80  83   BUN 8 - 23 mg/dL '17  15  11   '$ Creatinine 0.44 - 1.00 mg/dL 1.35  1.28  1.07   Sodium 135 - 145 mmol/L 133  130  133   Potassium 3.5 - 5.1 mmol/L 4.6  3.8  4.3   Chloride 98 - 111 mmol/L 97  101  109   CO2 22 - 32 mmol/L '25  21  16   '$ Calcium 8.9 - 10.3 mg/dL 9.7  8.2  8.0   Total Protein 6.5 - 8.1 g/dL 7.5     Total Bilirubin 0.3 - 1.2 mg/dL 0.8     Alkaline Phos 38 - 126 U/L 85     AST 15 - 41 U/L 13     ALT 0 - 44 U/L 7        Radiology Studies: CT Angio Abd/Pel W and/or Wo Contrast  Result Date: 12/06/2022 CLINICAL DATA:  Evaluate for acute mesenteric ischemia. Follow-up prior CT. EXAM: CTA ABDOMEN AND PELVIS WITHOUT AND WITH CONTRAST TECHNIQUE:  Multidetector CT imaging of the abdomen and pelvis was performed using the standard protocol during bolus administration of intravenous contrast. Multiplanar reconstructed images and MIPs were obtained and reviewed to evaluate the vascular anatomy. RADIATION DOSE REDUCTION: This exam was performed according to the departmental dose-optimization program which includes automated exposure control, adjustment of the mA and/or kV according to patient size and/or use of iterative reconstruction technique. CONTRAST:  43m OMNIPAQUE IOHEXOL 350 MG/ML SOLN COMPARISON:  12/06/2022 FINDINGS: VASCULAR Aorta: Aortic atherosclerotic calcifications. Normal caliber aorta without aneurysm, dissection, vasculitis or significant stenosis. Celiac: Patent without evidence of aneurysm, dissection, vasculitis or significant stenosis. SMA: Patent without evidence of aneurysm, dissection, vasculitis or significant stenosis. Renals: Mild, approximately 20% stenosis at the origin of the left  renal artery secondary to calcified plaque. Right renal artery is patent. IMA: Patent without evidence of aneurysm, dissection, vasculitis or significant stenosis. Inflow: Patent without evidence of aneurysm, dissection, vasculitis or significant stenosis. Proximal Outflow: Bilateral common femoral and visualized portions of the superficial and profunda femoral arteries are patent without evidence of aneurysm, dissection, vasculitis or significant stenosis. Veins: No obvious venous abnormality within the limitations of this arterial phase study. Review of the MIP images confirms the above findings. NON-VASCULAR Lower chest: No acute abnormality. Hepatobiliary: No suspicious enhancing liver lesion identified. Status post cholecystectomy. No bile duct dilatation identified. Pancreas: Unremarkable. No pancreatic ductal dilatation or surrounding inflammatory changes. Spleen: Normal in size without focal abnormality. Adrenals/Urinary Tract: Normal adrenal  glands. Unchanged simple appearing right kidney cyst measuring 5.5 cm. No follow-up imaging recommended. No nephrolithiasis, hydronephrosis or suspicious mass. Bladder is unremarkable. Stomach/Bowel: Unchanged small hiatal hernia. Stomach is otherwise within normal limits. No pathologic dilatation of the large or small bowel loops to suggest a bowel obstruction. Colonic diverticulosis. No bowel wall thickening, inflammation, or distension. No signs of pneumatosis. Lymphatic: There is no abdominopelvic adenopathy. No inguinal adenopathy. Reproductive: Status post hysterectomy. No adnexal masses. Other: Status post umbilical hernia. Stable from the prior exam. Diffuse mesenteric edema is again seen as noted previously. Small volume scab sat scattered areas of free fluid noted within the mesentery and along the peritoneal reflections. No focal fluid collections to suggest abscess. No pneumoperitoneum. Musculoskeletal: Degenerative disc disease. No acute or suspicious osseous findings. IMPRESSION: 1. No evidence for acute mesenteric ischemia. 2. Mild, approximately 20% stenosis at the origin of the left renal artery secondary to calcified plaque. 3. Persistent diffuse mesenteric edema with small volume of free fluid within the mesentery and along the peritoneal reflections. Findings are nonspecific. 4. No signs of abscess or pneumoperitoneum. 5. Unchanged small hiatal hernia. 6. Status post umbilical hernia repair. 7.  Aortic Atherosclerosis (ICD10-I70.0). Electronically Signed   By: Kerby Moors M.D.   On: 12/06/2022 14:29   CT ABDOMEN PELVIS WO CONTRAST  Result Date: 12/06/2022 CLINICAL DATA:  Lower abdominal pain. EXAM: CT ABDOMEN AND PELVIS WITHOUT CONTRAST TECHNIQUE: Multidetector CT imaging of the abdomen and pelvis was performed following the standard protocol without IV contrast. RADIATION DOSE REDUCTION: This exam was performed according to the departmental dose-optimization program which includes  automated exposure control, adjustment of the mA and/or kV according to patient size and/or use of iterative reconstruction technique. COMPARISON:  CT abdomen pelvis dated October 27, 2022. FINDINGS: Lower chest: No acute abnormality. Hepatobiliary: No focal liver abnormality is seen. Status post cholecystectomy. No biliary dilatation. Pancreas: Atrophic. No ductal dilatation or surrounding inflammatory changes. Spleen: Normal in size without focal abnormality. Adrenals/Urinary Tract: Adrenal glands are unremarkable. Unchanged 5.3 cm right renal simple cyst. No follow-up imaging is recommended. No renal calculi or hydronephrosis. Small amount of excreted contrast in both renal collecting systems and the bladder. The bladder is otherwise unremarkable. Stomach/Bowel: Unchanged small hiatal hernia. The stomach is otherwise within normal limits. No bowel wall thickening, distention, or surrounding inflammatory changes. Severe sigmoid colonic diverticulosis. Vascular/Lymphatic: Aortic atherosclerosis. No enlarged abdominal or pelvic lymph nodes. Reproductive: Status post hysterectomy. No adnexal masses. Other: Interval umbilical hernia repair without recurrent hernia. Slightly increased trace free fluid and mesenteric fat stranding in the pelvis. No pneumoperitoneum. Musculoskeletal: No acute or significant osseous findings. IMPRESSION: 1. No acute intra-abdominal process.  No bowel obstruction. 2. Slightly increased trace free fluid and mesenteric fat stranding in the pelvis, nonspecific. 3.  Interval umbilical hernia repair without recurrent hernia. 4.  Aortic Atherosclerosis (ICD10-I70.0). Electronically Signed   By: Titus Dubin M.D.   On: 12/06/2022 12:40     Scheduled Meds:  amiodarone  200 mg Oral Daily   apixaban  5 mg Oral BID   furosemide  20 mg Oral Daily   gabapentin  100 mg Oral BID   ipratropium  2.5 mL Nebulization BID   levothyroxine  225 mcg Oral Q0600   LORazepam  0.5 mg Oral QHS    metoprolol succinate  12.5 mg Oral Daily   pantoprazole  40 mg Oral Daily   polyethylene glycol  17 g Oral Daily   Continuous Infusions:   LOS: 0 days   Time spent: [place], MD [facility] To contact the attending provider between 7A-7P or the covering provider during after hours 7P-7A, please log into the web site [email or web address] and access using universal [place] password for that web site. If you do not have the password, please call the hospital operator.  12/07/2022, 7:55 AM

## 2022-12-08 DIAGNOSIS — R109 Unspecified abdominal pain: Secondary | ICD-10-CM | POA: Diagnosis not present

## 2022-12-08 MED ORDER — DEXTROSE-NACL 5-0.9 % IV SOLN: INTRAVENOUS | Status: DC

## 2022-12-08 MED ORDER — PROCHLORPERAZINE EDISYLATE 10 MG/2ML IJ SOLN
5.0000 mg | Freq: Once | INTRAMUSCULAR | Status: AC | PRN
  Administered 2022-12-08: 5 mg via INTRAVENOUS
  Filled 2022-12-08: qty 2

## 2022-12-08 MED ORDER — SODIUM CHLORIDE 0.9 % IV SOLN: INTRAVENOUS | Status: DC

## 2022-12-08 MED ORDER — PROCHLORPERAZINE EDISYLATE 10 MG/2ML IJ SOLN
5.0000 mg | Freq: Once | INTRAMUSCULAR | Status: AC
  Administered 2022-12-08: 5 mg via INTRAVENOUS
  Filled 2022-12-08: qty 2

## 2022-12-08 MED ORDER — HYDROCODONE-ACETAMINOPHEN 5-325 MG PO TABS
1.0000 | ORAL_TABLET | Freq: Four times a day (QID) | ORAL | Status: DC | PRN
  Administered 2022-12-09 – 2022-12-11 (×7): 1 via ORAL
  Filled 2022-12-08 (×8): qty 1

## 2022-12-08 MED ORDER — IPRATROPIUM BROMIDE 0.02 % IN SOLN: 2.5000 mL | Freq: Four times a day (QID) | RESPIRATORY_TRACT | Status: DC | PRN

## 2022-12-08 NOTE — Progress Notes (Addendum)
PROGRESS NOTE   [name]  [id number] DOB: 27-May-1931 DOA: 12/06/2022 PCP: Deon Pilling, NP  Brief Narrative:  56 Y female recent diagnosis abdominal pain at ED visit 10/07/2022 Surgery 10/31/2022 Dr. Windle Guard for incarcerated about her hernia hernia sac = metastatic moderately differentiated adeno CA omentum biopsy-immunohistochemistry = gynecological primary?  Ovarian-- at last OV Dr. Benay Spice 2/12/24the time she did not wish to consider surgery chemotherapy therapy and declined gyn-onc evaluation - referred to genetics counseling The plan at the time was to get a CA125 around mid February and a restaging CT around March Tamoxifen surgical options chemotherapy were discussed with the patient by Dr. Benay Spice on visit at 11/17/2022 HFrEF/HFpEF (EF 50-55%) followed by Dr. [name] on [place],  Tachybradycardia syndrome CHADVASC at least 5, [employer] PPM 05/27/2022//amiodarone/Eliquis 5 twice daily Mild aortic regurgitation Chronic right bundle CKD 3B Acquired hypothyroidism HLD Anxiety  [facility] 12/[facility]/2022 bilateral lower abdominal pain nonradiating  last BM 3/1 no emesis no fever chills urinary symptoms WBC 7.1 hemoglobin 13.2 platelet 224 potassium 4.6 bicarb 25 BUN/creatinine 17/1.3 lactic acid 0.9 CTA abdomen pelvis no acute ischemia 20% left renal artery stenosis small hiatal hernia unchanged no abscess no pneumoperitoneum Rx Dilaudid IV fluids Fentanyl Home meds   Hospital-Problem based course  Severe abd pain on admit--now mainly nausea-likely secondary to progression of disease? - Tolerating some diet but having some nausea, -Some somnolence today so discontinue IV pain meds, no Ativan - Continue clear liquid continue Zofran 4 mg IV every 6 as needed, MiraLAX 17 g daily, Protonix 40 daily - Continue diet  Adenocarcinoma?  Ovarian versus omental primary - Patient is categorically declining chemotherapy or any further surgery - Dr. Benay Spice oncology  is out of town and I have asked Dr. Marin Olp to come and see the patient today to discuss other options with the family - May require further discussion of is warranted with palliative care  HFrEF/HFpEF EF 50-55% Mild aortic regurgitation - Stop Aldactone, Entresto--3/4 holding Lasix 20  - AM labs  Tachybradycardia syndrome CHADVASC >5/[employer] PPM-on amnio Chronic right bundle branch block - Continue amiodarone 200 daily, Toprol-XL 12.5 daily, apixaban 5 twice daily-seems predominantly in sinus  -am labs  CKD 3B Urinary retention from Likely pain meds - Bladder scan showed 136 cc, Straight cath placed and will keep as in-dwelling - Outpatient follow-up  Anxiety - Stop Ativan complete given somnolence   Hold statin, dextromethorphan, B12, D3, Tessalon, MVI for now  DVT prophylaxis:  Code Status:  Family Communication:  Disposition:  Status is: Observation The patient will require care spanning > 2 midnights and should be moved to inpatient because:   Requires further discussion with oncology team with regards to next therapeutic steps      Subjective:  Sleepy this afternoon has not really eaten all day missed pain meds probably because of being given Ativan overnight Had 300 out urine this morning after review of nursing documentation with them Daughter is concerned about options for care I have mentioned palliation given patient's resistance to do further chemo and we await input from oncology  Objective: Vitals:   12/07/22 2140 12/08/22 0418 12/08/22 0433 12/08/22 0439  BP: (!) 106/41 (!) 159/50 (!) 110/40 (!) 109/42  Pulse: 60 82 (!) 59 61  Resp: '15 18 14 14  '$ Temp: 98.1 F (36.7 C) 98.4 F (36.9 C) 99.1 F (37.3 C)   TempSrc: Oral Oral Axillary   SpO2: 92% 92% 100% 99%    Intake/Output Summary (Last  24 hours) at 12/08/2022 1319 Last data filed at 12/08/2022 0600 Gross per 24 hour  Intake 240 ml  Output 350 ml  Net -110 ml    There were no vitals filed  for this visit.  Examination:  EOMI NCAT no focal deficit coherent x 4 Neck soft supple Abdomen slight distention no rebound no guarding but is tender to light palpation umbilical scar noted from recent surgery S1-S2 seems to be in sinus    Data Reviewed: personally reviewed   CBC    Component Value Date/Time   WBC 9.1 12/07/2022 0720   RBC 4.03 12/07/2022 0720   HGB 11.3 (L) 12/07/2022 0720   HGB 13.0 05/15/2022 0944   HCT 36.5 12/07/2022 0720   HCT 42.0 05/15/2022 0944   PLT 253 12/07/2022 0720   PLT 216 05/15/2022 0944   MCV 90.6 12/07/2022 0720   MCV 86 05/15/2022 0944   MCH 28.0 12/07/2022 0720   MCHC 31.0 12/07/2022 0720   RDW 14.6 12/07/2022 0720   RDW 13.8 05/15/2022 0944   LYMPHSABS 1.0 10/29/2022 0414   MONOABS 0.8 10/29/2022 0414   EOSABS 0.3 10/29/2022 0414   BASOSABS 0.0 10/29/2022 0414      Latest Ref Rng & Units 12/07/2022    7:20 AM 12/06/2022   10:19 AM 11/03/2022    3:58 AM  CMP  Glucose 70 - 99 mg/dL 90  90  80   BUN 8 - 23 mg/dL '20  17  15   '$ Creatinine 0.44 - 1.00 mg/dL 1.45  1.35  1.28   Sodium 135 - 145 mmol/L 133  133  130   Potassium 3.5 - 5.1 mmol/L 4.7  4.6  3.8   Chloride 98 - 111 mmol/L 101  97  101   CO2 22 - 32 mmol/L '22  25  21   '$ Calcium 8.9 - 10.3 mg/dL 8.5  9.7  8.2   Total Protein 6.5 - 8.1 g/dL  7.5    Total Bilirubin 0.3 - 1.2 mg/dL  0.8    Alkaline Phos 38 - 126 U/L  85    AST 15 - 41 U/L  13    ALT 0 - 44 U/L  7       Radiology Studies: CT Angio Abd/Pel W and/or Wo Contrast  Result Date: 12/06/2022 CLINICAL DATA:  Evaluate for acute mesenteric ischemia. Follow-up prior CT. EXAM: CTA ABDOMEN AND PELVIS WITHOUT AND WITH CONTRAST TECHNIQUE: Multidetector CT imaging of the abdomen and pelvis was performed using the standard protocol during bolus administration of intravenous contrast. Multiplanar reconstructed images and MIPs were obtained and reviewed to evaluate the vascular anatomy. RADIATION DOSE REDUCTION: This exam was  performed according to the departmental dose-optimization program which includes automated exposure control, adjustment of the mA and/or kV according to patient size and/or use of iterative reconstruction technique. CONTRAST:  14m OMNIPAQUE IOHEXOL 350 MG/ML SOLN COMPARISON:  12/06/2022 FINDINGS: VASCULAR Aorta: Aortic atherosclerotic calcifications. Normal caliber aorta without aneurysm, dissection, vasculitis or significant stenosis. Celiac: Patent without evidence of aneurysm, dissection, vasculitis or significant stenosis. SMA: Patent without evidence of aneurysm, dissection, vasculitis or significant stenosis. Renals: Mild, approximately 20% stenosis at the origin of the left renal artery secondary to calcified plaque. Right renal artery is patent. IMA: Patent without evidence of aneurysm, dissection, vasculitis or significant stenosis. Inflow: Patent without evidence of aneurysm, dissection, vasculitis or significant stenosis. Proximal Outflow: Bilateral common femoral and visualized portions of the superficial and profunda femoral arteries are patent without evidence  of aneurysm, dissection, vasculitis or significant stenosis. Veins: No obvious venous abnormality within the limitations of this arterial phase study. Review of the MIP images confirms the above findings. NON-VASCULAR Lower chest: No acute abnormality. Hepatobiliary: No suspicious enhancing liver lesion identified. Status post cholecystectomy. No bile duct dilatation identified. Pancreas: Unremarkable. No pancreatic ductal dilatation or surrounding inflammatory changes. Spleen: Normal in size without focal abnormality. Adrenals/Urinary Tract: Normal adrenal glands. Unchanged simple appearing right kidney cyst measuring 5.5 cm. No follow-up imaging recommended. No nephrolithiasis, hydronephrosis or suspicious mass. Bladder is unremarkable. Stomach/Bowel: Unchanged small hiatal hernia. Stomach is otherwise within normal limits. No pathologic  dilatation of the large or small bowel loops to suggest a bowel obstruction. Colonic diverticulosis. No bowel wall thickening, inflammation, or distension. No signs of pneumatosis. Lymphatic: There is no abdominopelvic adenopathy. No inguinal adenopathy. Reproductive: Status post hysterectomy. No adnexal masses. Other: Status post umbilical hernia. Stable from the prior exam. Diffuse mesenteric edema is again seen as noted previously. Small volume scab sat scattered areas of free fluid noted within the mesentery and along the peritoneal reflections. No focal fluid collections to suggest abscess. No pneumoperitoneum. Musculoskeletal: Degenerative disc disease. No acute or suspicious osseous findings. IMPRESSION: 1. No evidence for acute mesenteric ischemia. 2. Mild, approximately 20% stenosis at the origin of the left renal artery secondary to calcified plaque. 3. Persistent diffuse mesenteric edema with small volume of free fluid within the mesentery and along the peritoneal reflections. Findings are nonspecific. 4. No signs of abscess or pneumoperitoneum. 5. Unchanged small hiatal hernia. 6. Status post umbilical hernia repair. 7.  Aortic Atherosclerosis (ICD10-I70.0). Electronically Signed   By: Kerby Moors M.D.   On: 12/06/2022 14:29     Scheduled Meds:  amiodarone  200 mg Oral Daily   apixaban  5 mg Oral BID   furosemide  20 mg Oral Daily   gabapentin  100 mg Oral BID   ipratropium  2.5 mL Nebulization BID   levothyroxine  225 mcg Oral Q0600   LORazepam  0.5 mg Oral QHS   metoprolol succinate  12.5 mg Oral Daily   pantoprazole  40 mg Oral Daily   polyethylene glycol  17 g Oral Daily   Continuous Infusions:   LOS: 1 day   Time spent: [place], MD [facility] To contact the attending provider between 7A-7P or the covering provider during after hours 7P-7A, please log into the web site [email or web address] and access using universal [place] password for that web site. If  you do not have the password, please call the hospital operator.  12/08/2022, 1:19 PM

## 2022-12-08 NOTE — TOC Progression Note (Signed)
Transition of Care [facility]) - Progression Note    Patient Details  Name: [name] MRN: [id number] Date of Birth: 14-Sep-1931  Transition of Care [facility]) CM/SW [place], RN Phone Number:[phone]  12/08/2022, 1:12 PM  Clinical Narrative:     Transition of Care (TOC) Screening Note   Patient Details  Name: [name] Date of Birth: Nov 26, 1930   Transition of Care [facility]) CM/SW Contact:    Angelita Ingles, RN Phone Number: 12/08/2022, 1:13 PM    Transition of Care Department [facility]) has reviewed patient and no TOC needs have been identified at this time. We will continue to monitor patient advancement through interdisciplinary progression rounds. If new patient transition needs arise, please place a TOC consult.          Expected Discharge Plan and Services                                               Social Determinants of Health (SDOH) Interventions SDOH Screenings   Food Insecurity: No Food Insecurity (11/17/2022)  Housing: Low Risk  (11/17/2022)  Transportation Needs: No Transportation Needs (11/17/2022)  Utilities: Not At Risk (11/17/2022)  Alcohol Screen: Low Risk  (12/16/2021)  Depression (PHQ2-9): Medium Risk (11/17/2022)  Financial Resource Strain: Low Risk  (12/16/2021)  Tobacco Use: Low Risk  (12/06/2022)    Readmission Risk Interventions     No data to display

## 2022-12-08 NOTE — Plan of Care (Signed)
Patient AOX4, VSS throughout shift. All meds given on time as ordered. Pt c/o abd pain relieved by PRN pain meds. Diminished lungs, IS encouraged. Purewick in place. Pt had panic attack with hip pain and tremors.  Provider notified.  Pt given compazine for emesis episode and pain meds.  VSS, however, pt now on 4LNC.  Provider aware.  POC maintained, will continue to monitor.  Problem: Education: Goal: Knowledge of General Education information will improve Description: Including pain rating scale, medication(s)/side effects and non-pharmacologic comfort measures Outcome: Progressing   Problem: Health Behavior/Discharge Planning: Goal: Ability to manage health-related needs will improve Outcome: Progressing   Problem: Clinical Measurements: Goal: Ability to maintain clinical measurements within normal limits will improve Outcome: Progressing Goal: Will remain free from infection Outcome: Progressing Goal: Diagnostic test results will improve Outcome: Progressing Goal: Respiratory complications will improve Outcome: Progressing Goal: Cardiovascular complication will be avoided Outcome: Progressing   Problem: Activity: Goal: Risk for activity intolerance will decrease Outcome: Progressing   Problem: Nutrition: Goal: Adequate nutrition will be maintained Outcome: Progressing   Problem: Coping: Goal: Level of anxiety will decrease Outcome: Progressing   Problem: Elimination: Goal: Will not experience complications related to bowel motility Outcome: Progressing Goal: Will not experience complications related to urinary retention Outcome: Progressing   Problem: Pain Managment: Goal: General experience of comfort will improve Outcome: Progressing   Problem: Safety: Goal: Ability to remain free from injury will improve Outcome: Progressing   Problem: Skin Integrity: Goal: Risk for impaired skin integrity will decrease Outcome: Progressing

## 2022-12-08 NOTE — Consult Note (Signed)
[name] is a very nice 89 year old white female.  She does have an interesting history.  She is in with her daughter and her daughter's husband.  She apparently presented with abdominal pain back in early January of this year.  She was found to have an incarcerated hernia.  She underwent surgery on 10/31/2022.  Shockingly, she was found to have a moderately differentiated adeno carcinoma.  It was felt that this may have been an ovarian primary.  Patient is followed by Dr. Julieanne Manson.  He has not yet seen her in the office.  He had tests run on the tumor.  The tumor was ER positive.  She does have some cardiac issues.  She had an ejection fraction of 50-55% that was done by her cardiologist.  She subsequently presented with abdominal pain in the lower abdomen on 12/06/2022.  She had a bowel movement on 12/05/2022.  She had no fever.  There is no urinary symptoms.  She had some shortness of breath.  She had no rash.  She had no obvious melena or bright red blood per rectum.  She underwent a CT angiogram of the abdomen and pelvis.  This did not show any mesenteric ischemia.  Of note, back in late January, her CA 125 was 22.  She does have a pacemaker in place.  She is on amiodarone.  She is also on Eliquis.  She is still having abdominal discomfort.  Again is hard to say where this might be coming from.  It certainly could be coming from her underlying malignancy although the CT scan did not show anything that was obvious.  She has had no palpable lymph nodes.  She has had no rashes.  She seemed to be doing fairly well up until recently.  Her vital signs are temperature of 98.1.  Pulse 60.  Blood pressure 102/43.  Her head neck exam shows no scleral icterus.  There is no oral lesions.  There is no adenopathy in the neck.  Lungs are with some slight decrease at the bases.  Cardiac exam regular rate and rhythm.  She has occasional extra beat.  Abdomen is soft.  There is some tenderness in the lower  abdomen.  Bowel sounds are present.  There is no fluid wave.  There is no obvious abdominal mass.  There is no nodularity subcutaneously.  There is no palpable liver or spleen tip.  Extremities shows some trace edema in the legs.  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  Neurological exam is nonfocal.  Skin exam shows no rashes, ecchymosis or petechia.   [name] is a very nice 89 year old white female.  She has a very interesting history.  She was found to have this adenocarcinoma just by chance given the hernia repair.  It is hard to say what is causing all this discomfort.  I will know if she may have involvement of a celiac plexus that could be causing her pain.  Typically, this, pain is sort of in the upper abdomen and back.  I think the real issue is whether or not the cancer needs to be treated.  It certainly would be difficult to treat without any obvious measurable disease.  We will see what a CA-125 shows.  I think would be nice to get a PET scan but I just do not think that this can will be done as an inpatient.  1 possibility is to start treatment with tamoxifen.  I just would not think she would  be a candidate for systemic chemotherapy.  I think even with single agent chemotherapy she would have a hard time with.  I cannot think of any other radiographic studies that we need to get right now.  We will follow her along.  I do appreciate the great care that she is getting from the staff upon 6 E.  Lattie Haw, MD  Jeneen Rinks 1:5

## 2022-12-09 DIAGNOSIS — Z48815 Encounter for surgical aftercare following surgery on the digestive system: Secondary | ICD-10-CM | POA: Diagnosis not present

## 2022-12-09 DIAGNOSIS — I130 Hypertensive heart and chronic kidney disease with heart failure and stage 1 through stage 4 chronic kidney disease, or unspecified chronic kidney disease: Secondary | ICD-10-CM | POA: Diagnosis not present

## 2022-12-09 DIAGNOSIS — N1832 Chronic kidney disease, stage 3b: Secondary | ICD-10-CM | POA: Diagnosis not present

## 2022-12-09 DIAGNOSIS — E440 Moderate protein-calorie malnutrition: Secondary | ICD-10-CM | POA: Insufficient documentation

## 2022-12-09 DIAGNOSIS — I5042 Chronic combined systolic (congestive) and diastolic (congestive) heart failure: Secondary | ICD-10-CM | POA: Diagnosis not present

## 2022-12-09 DIAGNOSIS — R109 Unspecified abdominal pain: Secondary | ICD-10-CM | POA: Diagnosis not present

## 2022-12-09 LAB — COMPREHENSIVE METABOLIC PANEL
ALT: 14 U/L (ref 0–44)
ALT: 14 U/L (ref 0–44)
AST: 19 U/L (ref 15–41)
AST: 19 U/L (ref 15–41)
Albumin: 2.6 g/dL — ABNORMAL LOW (ref 3.5–5.0)
Albumin: 2.4 g/dL — ABNORMAL LOW (ref 3.5–5.0)
Alkaline Phosphatase: 84 U/L (ref 38–126)
Alkaline Phosphatase: 75 U/L (ref 38–126)
Anion gap: 9 (ref 5–15)
Anion gap: 4 — ABNORMAL LOW (ref 5–15)
BUN: 29 mg/dL — ABNORMAL HIGH (ref 8–23)
BUN: 26 mg/dL — ABNORMAL HIGH (ref 8–23)
CO2: 20 mmol/L — ABNORMAL LOW (ref 22–32)
CO2: 21 mmol/L — ABNORMAL LOW (ref 22–32)
Calcium: 7.9 mg/dL — ABNORMAL LOW (ref 8.9–10.3)
Calcium: 7.5 mg/dL — ABNORMAL LOW (ref 8.9–10.3)
Chloride: 96 mmol/L — ABNORMAL LOW (ref 98–111)
Chloride: 100 mmol/L (ref 98–111)
Creatinine, Ser: 1.97 mg/dL — ABNORMAL HIGH (ref 0.44–1.00)
Creatinine, Ser: 1.76 mg/dL — ABNORMAL HIGH (ref 0.44–1.00)
GFR, Estimated: 24 mL/min — ABNORMAL LOW (ref >60)
GFR, Estimated: 27 mL/min — ABNORMAL LOW (ref >60)
Glucose, Bld: 122 mg/dL — ABNORMAL HIGH (ref 70–99)
Glucose, Bld: 124 mg/dL — ABNORMAL HIGH (ref 70–99)
Potassium: 4.2 mmol/L (ref 3.5–5.1)
Potassium: 4.5 mmol/L (ref 3.5–5.1)
Sodium: 125 mmol/L — ABNORMAL LOW (ref 135–145)
Sodium: 125 mmol/L — ABNORMAL LOW (ref 135–145)
Total Bilirubin: 0.4 mg/dL (ref 0.3–1.2)
Total Bilirubin: 0.5 mg/dL (ref 0.3–1.2)
Total Protein: 5.4 g/dL — ABNORMAL LOW (ref 6.5–8.1)
Total Protein: 5.3 g/dL — ABNORMAL LOW (ref 6.5–8.1)

## 2022-12-09 LAB — CBC WITH DIFFERENTIAL/PLATELET
Abs Immature Granulocytes: 0.12 10*3/uL — ABNORMAL HIGH (ref 0.00–0.07)
Basophils Absolute: 0 10*3/uL (ref 0.0–0.1)
Basophils Relative: 0 %
Eosinophils Absolute: 0.2 10*3/uL (ref 0.0–0.5)
Eosinophils Relative: 2 %
HCT: 32.8 % — ABNORMAL LOW (ref 36.0–46.0)
Hemoglobin: 10.1 g/dL — ABNORMAL LOW (ref 12.0–15.0)
Immature Granulocytes: 1 %
Lymphocytes Relative: 8 %
Lymphs Abs: 0.7 10*3/uL (ref 0.7–4.0)
MCH: 28.5 pg (ref 26.0–34.0)
MCHC: 30.8 g/dL (ref 30.0–36.0)
MCV: 92.4 fL (ref 80.0–100.0)
Monocytes Absolute: 1.1 10*3/uL — ABNORMAL HIGH (ref 0.1–1.0)
Monocytes Relative: 12 %
Neutro Abs: 6.9 10*3/uL (ref 1.7–7.7)
Neutrophils Relative %: 77 %
Platelets: 237 10*3/uL (ref 150–400)
RBC: 3.55 MIL/uL — ABNORMAL LOW (ref 3.87–5.11)
RDW: 14.3 % (ref 11.5–15.5)
WBC: 9.1 10*3/uL (ref 4.0–10.5)
nRBC: 0 % (ref 0.0–0.2)

## 2022-12-09 LAB — SODIUM, URINE, RANDOM: Sodium, Ur: <10 mmol/L

## 2022-12-09 LAB — CREATININE, URINE, RANDOM: Creatinine, Urine: 57 mg/dL

## 2022-12-09 LAB — LACTATE DEHYDROGENASE: LDH: 105 U/L (ref 98–192)

## 2022-12-09 LAB — MAGNESIUM: Magnesium: 1.7 mg/dL (ref 1.7–2.4)

## 2022-12-09 LAB — OSMOLALITY, URINE: Osmolality, Ur: 191 mOsm/kg — ABNORMAL LOW (ref 300–900)

## 2022-12-09 MED ORDER — LORAZEPAM 0.5 MG PO TABS
0.5000 mg | ORAL_TABLET | Freq: Once | ORAL | Status: AC
  Administered 2022-12-09: 0.5 mg via ORAL
  Filled 2022-12-09: qty 1

## 2022-12-09 MED ORDER — APIXABAN 2.5 MG PO TABS
2.5000 mg | ORAL_TABLET | Freq: Two times a day (BID) | ORAL | Status: DC
  Administered 2022-12-09: 2.5 mg via ORAL
  Filled 2022-12-09: qty 1

## 2022-12-09 MED ORDER — ENSURE ENLIVE PO LIQD
237.0000 mL | Freq: Two times a day (BID) | ORAL | Status: DC
  Administered 2022-12-09 – 2022-12-16 (×6): 237 mL via ORAL

## 2022-12-09 MED ORDER — SODIUM CHLORIDE 0.9 % IV SOLN: INTRAVENOUS | Status: DC

## 2022-12-09 MED ORDER — CHLORHEXIDINE GLUCONATE CLOTH 2 % EX PADS
6.0000 | MEDICATED_PAD | Freq: Every day | CUTANEOUS | Status: DC
  Administered 2022-12-09 – 2022-12-16 (×8): 6 via TOPICAL

## 2022-12-09 NOTE — Progress Notes (Addendum)
Initial Nutrition Assessment  DOCUMENTATION CODES:   Non-severe (moderate) malnutrition in context of chronic illness  INTERVENTION:   -Ensure Plus High Protein po BID, each supplement provides 350 kcal and 20 grams of protein.   NUTRITION DIAGNOSIS:   Moderate Malnutrition related to chronic illness, cancer and cancer related treatments as evidenced by mild fat depletion, mild muscle depletion.  GOAL:   Patient will meet greater than or equal to 90% of their needs  MONITOR:   PO intake, Supplement acceptance  REASON FOR ASSESSMENT:   Malnutrition Screening Tool    ASSESSMENT:   89 y.o. 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  Patient in room, sleeping. No family at bedside. When awake pt states she was not eating well PTA. Ate 100% of her breakfast this morning of pancakes and sausage. Pt had lunch delivered at time of visit. States soups and soft foods are easier to eat. Sometimes get choked on foods. Has no appetite. States she uses protein supplements at home, agreeable to receiving here in vanilla flavor.   Per bed scale weight: 206 lbs UBW ~194 lbs.  Medications: Miralax  Labs reviewed:   Low Na  NUTRITION - FOCUSED PHYSICAL EXAM:  Flowsheet Row Most Recent Value  Orbital Region Mild depletion  Upper Arm Region Mild depletion  Thoracic and Lumbar Region No depletion  Buccal Region Mild depletion  Temple Region Mild depletion  Clavicle Bone Region Mild depletion  Clavicle and Acromion Bone Region Mild depletion  Scapular Bone Region Mild depletion  Dorsal Hand Moderate depletion  Patellar Region Mild depletion  Anterior Thigh Region Mild depletion   Posterior Calf Region Mild depletion  Edema (RD Assessment) None  Hair Reviewed  Eyes Reviewed  Mouth Reviewed  Skin Reviewed  Nails Reviewed       Diet Order:   Diet Order             Diet regular Room service appropriate? Yes; Fluid consistency: Thin  Diet effective now                   EDUCATION NEEDS:   No education needs have been identified at this time  Skin:  Skin Assessment: Reviewed RN Assessment  Last BM:  3/2  Height:   Ht Readings from Last 1 Encounters:  12/09/22 '5\' 6"'$  (1.676 m)    Weight:   Wt Readings from Last 1 Encounters:  12/09/22 93.7 kg   BMI:  Body mass index is 33.34 kg/m.  Estimated Nutritional Needs:   Kcal:  1550-1750  Protein:  70-80g  Fluid:  1.7L/day  Clayton Bibles, MS, RD, LDN Inpatient Clinical Dietitian Contact information available via Amion

## 2022-12-09 NOTE — Progress Notes (Signed)
[name] is still having some abdominal discomfort.  She is in the lower abdomen.  It is more so on the right side than on the left side.  Her labs today show significant hyponatremia with a sodium 125.  Chloride is 96.  BUN is 29 creatinine 1.97.  Her calcium is only 7.9 but albumin is only 2.6.  Her white cell count is 9.1.  Hemoglobin is 10.1.  Platelet count 237,000.  I think this is 1 of those situations where a PET scan could certainly help [place] out.  I realize that she is inpatient.  However, we really need to determine how advanced her malignancy is as to how we can then treat her and how we can help manage her pain.  She has had no fever.  There is a little bit of a cough but nonproductive.  She has had no bleeding.  There is been no diarrhea.  Her vital signs show temperature of 98.2.  Pulse 61.  Blood pressure 98/41.  Her lungs sound relatively clear bilaterally.  Cardiac exam regular rate and rhythm.  There are no murmurs.  Abdomen is soft.  She is slightly obese.  Bowel sounds are decreased but present.  There is some tenderness in the lower abdomen.  There is little bit of guarding over on the right side.  Extremities shows some mild edema in the lower legs.  Neurological exam is nonfocal.  It is still hard to figure out while this pain.  Again, a PET scan would be reasonable in this situation even though she is in inpatient.  She clearly needs some IV fluids my point of view.  I do appreciate the care she is getting from the wonderful staff upon 6 E.  Lattie Haw, MD  Briscoe Deutscher

## 2022-12-09 NOTE — Progress Notes (Addendum)
PROGRESS NOTE   [name]  [id number] DOB: January 14, 1931 DOA: 12/06/2022 PCP: Deon Pilling, NP  Brief Narrative:  57 Y female recent diagnosis abdominal pain at ED visit 10/07/2022 Surgery 10/31/2022 Dr. Windle Guard for incarcerated about her hernia hernia sac = metastatic moderately differentiated adeno CA omentum biopsy-immunohistochemistry = gynecological primary?  Ovarian-- at last OV Dr. Benay Spice 2/12/24the time she did not wish to consider surgery chemotherapy therapy and declined gyn-onc evaluation - referred to genetics counseling The plan at the time was to get a CA125 around mid February and a restaging CT around March Tamoxifen surgical options chemotherapy were discussed with the patient by Dr. Benay Spice on visit at 11/17/2022 HFrEF/HFpEF (EF 50-55%) followed by Dr. [name] on [employer],  Tachybradycardia syndrome CHADVASC at least 5, [employer] PPM 05/27/2022//amiodarone/Eliquis 5 twice daily Mild aortic regurgitation Chronic right bundle CKD 3B Acquired hypothyroidism HLD Anxiety  Present [facility] 12/06/2022 bilateral lower abdominal pain nonradiating  last BM 3/1 no emesis no fever chills urinary symptoms WBC 7.1 hemoglobin 13.2 platelet 224 potassium 4.6 bicarb 25 BUN/creatinine 17/1.3 lactic acid 0.9 CTA abdomen pelvis no acute ischemia 20% left renal artery stenosis small hiatal hernia unchanged no abscess no pneumoperitoneum Rx Dilaudid IV fluids Fentanyl Home meds   Hospital-Problem based course  Severe abd pain on admit--now mainly nausea-likely secondary to progression of disease? - Tolerating some diet, will graduate and see if she can manage regular -Poor p.o. input so saline 125 cc/H - continue Zofran 4 mg IV every 6 as needed, MiraLAX 17 g daily, Protonix 40 daily  Adenocarcinoma?  Ovarian versus omental primary - Patient is categorically declining chemotherapy or any further surgery -Inpatient PET scan as per Dr. Marin Olp??  I am not sure how  this will ever be approved?-Hopeful for the same however -?  Palliative care?  Immunotherapy - Dr. Benay Spice will be back on 3/7 AM and I discussed this with the patient's daughter at the bedside  HFrEF/HFpEF EF 50-55% Mild aortic regurgitation - Stop Aldactone, Entresto--3/4 holding Lasix 20  -Monitor trends  Tachybradycardia syndrome CHADVASC >5/Medtronic PPM-on amnio Chronic right bundle branch block - Continue amiodarone 200 daily, Toprol-XL 12.5 daily, apixaban 5 twice daily-seems predominantly in sinus  -am labs  CKD 3B Urinary retention from Likely pain meds -Foley catheter placed 3/5 given retention - Azotemia is improving however will need saline 125 cc/H  Likely hypervolemic hyponatremia - Urine sodium urine osm to be obtained-nursing aware, - Fluid restrict to 1200 to 1500 cc and monitor trends - If no improvement salt tablets versus urea tablets  Anxiety - Stop Ativan complete given somnolence   Hold statin, dextromethorphan, B12, D3, Tessalon, MVI for now  DVT prophylaxis:  Code Status:  Family Communication:  Disposition:  Status is: Observation The patient will require care spanning > 2 midnights and should be moved to inpatient because:   Requires further discussion with oncology team with regards to next therapeutic steps      Subjective:  Much more awake Some discomfort in abdomen but overall improved No chest pain not eating much however-Foley catheter in place now draining well   Objective: Vitals:   12/08/22 1930 12/09/22 0509 12/09/22 1000 12/09/22 1354  BP: (!) 103/48 (!) 98/41  (!) 108/52  Pulse: 63 61  61  Resp: '15 18  18  '$ Temp: 98.8 F (37.1 C) 98.2 F (36.8 C)  97.8 F (36.6 C)  TempSrc: Oral Oral  Oral  SpO2: 93% 90%  90%  Weight:  93.7 kg   Height:   '5\' 6"'$  (1.676 m)     Intake/Output Summary (Last 24 hours) at 12/09/2022 1528 Last data filed at 12/09/2022 1449 Gross per 24 hour  Intake 1662.12 ml  Output 750 ml  Net 912.12  ml    Filed Weights   12/09/22 1000  Weight: 93.7 kg    Examination:  EOMI NCAT thick neck Mallampati 4 Neck soft supple Abdomen soft distended Foley in place slightly tender lower quadrant ROM intact Trace lower extremity edema S1-S2 no murmur Neuro intact  Data Reviewed: personally reviewed   CBC    Component Value Date/Time   WBC 9.1 12/09/2022 0603   RBC 3.55 (L) 12/09/2022 0603   HGB 10.1 (L) 12/09/2022 0603   HGB 13.0 05/15/2022 0944   HCT 32.8 (L) 12/09/2022 0603   HCT 42.0 05/15/2022 0944   PLT 237 12/09/2022 0603   PLT 216 05/15/2022 0944   MCV 92.4 12/09/2022 0603   MCV 86 05/15/2022 0944   MCH 28.5 12/09/2022 0603   MCHC 30.8 12/09/2022 0603   RDW 14.3 12/09/2022 0603   RDW 13.8 05/15/2022 0944   LYMPHSABS 0.7 12/09/2022 0603   MONOABS 1.1 (H) 12/09/2022 0603   EOSABS 0.2 12/09/2022 0603   BASOSABS 0.0 12/09/2022 0603      Latest Ref Rng & Units 12/09/2022    2:01 PM 12/09/2022    6:03 AM 12/07/2022    7:20 AM  CMP  Glucose 70 - 99 mg/dL 124  122  90   BUN 8 - 23 mg/dL '26  29  20   '$ Creatinine 0.44 - 1.00 mg/dL 1.76  1.97  1.45   Sodium 135 - 145 mmol/L 125  125  133   Potassium 3.5 - 5.1 mmol/L 4.5  4.2  4.7   Chloride 98 - 111 mmol/L 100  96  101   CO2 22 - 32 mmol/L '21  20  22   '$ Calcium 8.9 - 10.3 mg/dL 7.5  7.9  8.5   Total Protein 6.5 - 8.1 g/dL 5.3  5.4    Total Bilirubin 0.3 - 1.2 mg/dL 0.5  0.4    Alkaline Phos 38 - 126 U/L 75  84    AST 15 - 41 U/L 19  19    ALT 0 - 44 U/L 14  14       Radiology Studies: No results found.   Scheduled Meds:  amiodarone  200 mg Oral Daily   apixaban  2.5 mg Oral BID   Chlorhexidine Gluconate Cloth  6 each Topical Daily   feeding supplement  237 mL Oral BID BM   gabapentin  100 mg Oral BID   levothyroxine  225 mcg Oral Q0600   metoprolol succinate  12.5 mg Oral Daily   pantoprazole  40 mg Oral Daily   polyethylene glycol  17 g Oral Daily   Continuous Infusions:  sodium chloride 125 mL/hr at  12/09/22 0820     LOS: 2 days   Time spent: [place], MD [facility] To contact the attending provider between 7A-7P or the covering provider during after hours 7P-7A, please log into the web site [email or web address] and access using universal [place] password for that web site. If you do not have the password, please call the hospital operator.  12/09/2022, 3:28 PM

## 2022-12-10 DIAGNOSIS — Z515 Encounter for palliative care: Secondary | ICD-10-CM

## 2022-12-10 DIAGNOSIS — I480 Paroxysmal atrial fibrillation: Secondary | ICD-10-CM | POA: Diagnosis not present

## 2022-12-10 DIAGNOSIS — C799 Secondary malignant neoplasm of unspecified site: Secondary | ICD-10-CM

## 2022-12-10 DIAGNOSIS — R109 Unspecified abdominal pain: Secondary | ICD-10-CM | POA: Diagnosis not present

## 2022-12-10 DIAGNOSIS — R110 Nausea: Secondary | ICD-10-CM

## 2022-12-10 LAB — CBC WITH DIFFERENTIAL/PLATELET
Abs Immature Granulocytes: 0.03 10*3/uL (ref 0.00–0.07)
Basophils Absolute: 0 10*3/uL (ref 0.0–0.1)
Basophils Relative: 0 %
Eosinophils Absolute: 0.2 10*3/uL (ref 0.0–0.5)
Eosinophils Relative: 3 %
HCT: 31.1 % — ABNORMAL LOW (ref 36.0–46.0)
Hemoglobin: 9.1 g/dL — ABNORMAL LOW (ref 12.0–15.0)
Immature Granulocytes: 0 %
Lymphocytes Relative: 11 %
Lymphs Abs: 0.8 10*3/uL (ref 0.7–4.0)
MCH: 27.8 pg (ref 26.0–34.0)
MCHC: 29.3 g/dL — ABNORMAL LOW (ref 30.0–36.0)
MCV: 95.1 fL (ref 80.0–100.0)
Monocytes Absolute: 1 10*3/uL (ref 0.1–1.0)
Monocytes Relative: 15 %
Neutro Abs: 4.7 10*3/uL (ref 1.7–7.7)
Neutrophils Relative %: 71 %
Platelets: 231 10*3/uL (ref 150–400)
RBC: 3.27 MIL/uL — ABNORMAL LOW (ref 3.87–5.11)
RDW: 14.4 % (ref 11.5–15.5)
WBC: 6.7 10*3/uL (ref 4.0–10.5)
nRBC: 0 % (ref 0.0–0.2)

## 2022-12-10 LAB — URINALYSIS, ROUTINE W REFLEX MICROSCOPIC
Bilirubin Urine: NEGATIVE
Glucose, UA: NEGATIVE mg/dL
Ketones, ur: NEGATIVE mg/dL
Nitrite: NEGATIVE
Protein, ur: NEGATIVE mg/dL
Specific Gravity, Urine: 1.013 (ref 1.005–1.030)
WBC, UA: >50 WBC/hpf (ref 0–5)
pH: 5 (ref 5.0–8.0)

## 2022-12-10 LAB — COMPREHENSIVE METABOLIC PANEL
ALT: 13 U/L (ref 0–44)
AST: 15 U/L (ref 15–41)
Albumin: 2.3 g/dL — ABNORMAL LOW (ref 3.5–5.0)
Alkaline Phosphatase: 68 U/L (ref 38–126)
Anion gap: 5 (ref 5–15)
BUN: 19 mg/dL (ref 8–23)
CO2: 20 mmol/L — ABNORMAL LOW (ref 22–32)
Calcium: 7.7 mg/dL — ABNORMAL LOW (ref 8.9–10.3)
Chloride: 106 mmol/L (ref 98–111)
Creatinine, Ser: 1.28 mg/dL — ABNORMAL HIGH (ref 0.44–1.00)
GFR, Estimated: 40 mL/min — ABNORMAL LOW (ref >60)
Glucose, Bld: 97 mg/dL (ref 70–99)
Potassium: 4.7 mmol/L (ref 3.5–5.1)
Sodium: 131 mmol/L — ABNORMAL LOW (ref 135–145)
Total Bilirubin: 0.3 mg/dL (ref 0.3–1.2)
Total Protein: 5 g/dL — ABNORMAL LOW (ref 6.5–8.1)

## 2022-12-10 MED ORDER — BISACODYL 10 MG RE SUPP
10.0000 mg | Freq: Every day | RECTAL | Status: DC | PRN
  Administered 2022-12-11 – 2022-12-12 (×2): 10 mg via RECTAL
  Filled 2022-12-10 (×3): qty 1

## 2022-12-10 MED ORDER — SENNOSIDES-DOCUSATE SODIUM 8.6-50 MG PO TABS: 1.0000 | ORAL_TABLET | Freq: Two times a day (BID) | ORAL | Status: DC

## 2022-12-10 MED ORDER — DEXAMETHASONE SODIUM PHOSPHATE 10 MG/ML IJ SOLN
10.0000 mg | Freq: Once | INTRAMUSCULAR | Status: AC
  Administered 2022-12-11: 10 mg via INTRAVENOUS
  Filled 2022-12-10: qty 1

## 2022-12-10 MED ORDER — MORPHINE SULFATE (PF) 2 MG/ML IV SOLN: 2.0000 mg | INTRAVENOUS | Status: DC | PRN

## 2022-12-10 MED ORDER — SENNA 8.6 MG PO TABS
2.0000 | ORAL_TABLET | Freq: Every day | ORAL | Status: DC
  Administered 2022-12-10: 17.2 mg via ORAL
  Filled 2022-12-10: qty 2

## 2022-12-10 MED ORDER — PANTOPRAZOLE SODIUM 40 MG IV SOLR
40.0000 mg | Freq: Two times a day (BID) | INTRAVENOUS | Status: DC
  Administered 2022-12-10 – 2022-12-11 (×2): 40 mg via INTRAVENOUS
  Filled 2022-12-10 (×2): qty 10

## 2022-12-10 MED ORDER — SUCRALFATE 1 GM/10ML PO SUSP
1.0000 g | Freq: Three times a day (TID) | ORAL | Status: DC
  Administered 2022-12-10 – 2022-12-16 (×24): 1 g via ORAL
  Filled 2022-12-10 (×23): qty 10

## 2022-12-10 MED ORDER — PANTOPRAZOLE SODIUM 40 MG IV SOLR
40.0000 mg | INTRAVENOUS | Status: AC
  Administered 2022-12-10: 40 mg via INTRAVENOUS
  Filled 2022-12-10: qty 10

## 2022-12-10 MED ORDER — ALUM & MAG HYDROXIDE-SIMETH 200-200-20 MG/5ML PO SUSP: 15.0000 mL | ORAL | Status: DC | PRN

## 2022-12-10 MED ORDER — BENZONATATE 100 MG PO CAPS: 100.0000 mg | ORAL_CAPSULE | Freq: Three times a day (TID) | ORAL | Status: DC | PRN

## 2022-12-10 MED ORDER — TAMSULOSIN HCL 0.4 MG PO CAPS
0.4000 mg | ORAL_CAPSULE | Freq: Every day | ORAL | Status: DC
  Administered 2022-12-10 – 2022-12-15 (×6): 0.4 mg via ORAL
  Filled 2022-12-10 (×6): qty 1

## 2022-12-10 MED ORDER — APIXABAN 5 MG PO TABS
5.0000 mg | ORAL_TABLET | Freq: Two times a day (BID) | ORAL | Status: DC
  Administered 2022-12-10 – 2022-12-16 (×13): 5 mg via ORAL
  Filled 2022-12-10 (×13): qty 1

## 2022-12-10 MED ORDER — DEXAMETHASONE 4 MG PO TABS: 4.0000 mg | ORAL_TABLET | Freq: Every day | ORAL | Status: DC

## 2022-12-10 NOTE — Progress Notes (Signed)
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  On presentation, lipase was less than 10, UA was negative for UTI. CTA abdomen/pelvis negative for evidence for acute mesenteric ischemia. Mild approximately 20% stenosis at the origin of the left renal artery secondary to calcified plaque noted. Persistent diffuse mesenteric edema with small volume free fluid within the mesentery and along the peritoneal reflection is noted. No signs of abscess or pneumoperitoneum. Unchanged small hiatal hernia. Small hiatal hernia, unchanged.  She was given IV fluids and analgesics.  Oncology was consulted.  Assessment & Plan:   Intractable generalized abdominal pain with nausea -Imaging as above.  Questionable cause: Unsure if this is cancer-related pain.  Still having intermittent abdominal pain with nausea.  Right upper quadrant ultrasound on 10/28/2022 had shown no evidence of acute cholecystitis or biliary obstruction -Continue current pain management.  Tolerating some diet. -Switch Protonix to IV twice a day.  Add Carafate along with Maalox as needed  Metastatic adenocarcinoma involving umbilical hernia and omentum -Suspicion is for ovarian primary versus primary peritoneal carcinoma.  Oncology following.  Oncology wants to have inpatient PET scan done. -Patient is declining chemotherapy or any further surgery. -Consult palliative care for goals of care discussion  Chronic diastolic heart failure Mild aortic regurgitation Hypertension -Currently compensated.  Continue metoprolol  succinate.  Aldactone/Entresto/Lasix on hold.  Tachybradycardia syndrome status post PPM placement PAF on Eliquis -Currently rate controlled.  Continue amiodarone, Eliquis and metoprolol succinate.  Outpatient follow-up with cardiology  AKI on chronic kidney disease stage IIIb -Creatinine improving to 1.28 today, peaked to 1.97.  Decrease normal saline to 50 cc an hour. -Repeat a.m. labs  Hyponatremia -Possibly hypovolemic.  Improving.  Repeat a.m. labs.  IV fluids as above  Acute urinary retention -Foley catheter placed on 12/09/2022.  Start Flomax.  Will need outpatient urology follow-up  Anxiety -Continue gabapentin.  Ativan stopped because of somnolence.  Obesity -Outpatient follow-up  Goals of care -Consult palliative care for goals of care discussion   DVT prophylaxis: Eliquis Code Status: DNR Family Communication: Daughter at bedside Disposition Plan: Status is: Inpatient Remains inpatient appropriate because: Of severity of illness.  Still having significant abdominal pain.  Consultants: Oncology.  Consult palliative care  Procedures: None  Antimicrobials: None   Subjective: Patient seen and examined at bedside.  Complains of intermittent abdominal pain.  No bowel movement since admission.  No current nausea or vomiting.  Daughter present at bedside states that patient is more awake but still intermittently slow to respond.  Objective: Vitals:   12/09/22 1000 12/09/22 1354 12/09/22 2111 12/10/22 0609  BP:  (!) 108/52 (!) 121/53 (!) 142/53  Pulse:  61 60 63  Resp:  '18 18 16  '$ Temp:  97.8 F (36.6 C) 98.1 F (36.7 C) 98.3 F (36.8 C)  TempSrc:  Oral Oral Oral  SpO2:  90% 95% 97%  Weight: 93.7 kg   92.9 kg  Height: '5\' 6"'$  (1.676 m)       Intake/Output Summary (Last 24 hours) at 12/10/2022 1045 Last data filed at 12/10/2022 0615 Gross per 24 hour  Intake 2964 ml  Output 2800 ml  Net 164 ml  Filed Weights   12/09/22 1000 12/10/22 0609  Weight: 93.7 kg  92.9 kg    Examination:  General exam: Appears calm and comfortable.  Looks chronically ill and deconditioned.  Elderly female lying in bed. Respiratory system: Bilateral decreased breath sounds at bases with some scattered crackles Cardiovascular system: S1 & S2 heard, Rate controlled Gastrointestinal system: Abdomen is obese, distended, soft and mildly tender in the upper quadrant. Normal bowel sounds heard. Extremities: No cyanosis, clubbing; trace lower extremity edema present Central nervous system: Awake, slow to respond, poor historian.  No focal neurological deficits. Moving extremities Skin: No rashes, lesions or ulcers Psychiatry: Flat affect.  Not agitated Genitourinary: Foley catheter present  Data Reviewed: I have personally reviewed following labs and imaging studies  CBC: Recent Labs  Lab 12/06/22 1019 12/07/22 0720 12/09/22 0603 12/10/22 0555  WBC 7.1 9.1 9.1 6.7  NEUTROABS  --   --  6.9 4.7  HGB 13.2 11.3* 10.1* 9.1*  HCT 40.5 36.5 32.8* 31.1*  MCV 87.9 90.6 92.4 95.1  PLT 220 253 237 [id number]   Basic Metabolic Panel: Recent Labs  Lab 12/06/22 1019 12/07/22 0720 12/09/22 0603 12/09/22 1401 12/10/22 0555  NA 133* 133* 125* 125* 131*  K 4.6 4.7 4.2 4.5 4.7  CL 97* 101 96* 100 106  CO2 25 22 20* 21* 20*  GLUCOSE 90 90 122* 124* 97  BUN 17 20 29* 26* 19  CREATININE 1.35* 1.45* 1.97* 1.76* 1.28*  CALCIUM 9.7 8.5* 7.9* 7.5* 7.7*  MG  --   --  1.7  --   --    GFR: Estimated Creatinine Clearance: 34.2 mL/min (A) (by C-G formula based on SCr of 1.28 mg/dL (H)). Liver Function Tests: Recent Labs  Lab 12/06/22 1019 12/09/22 0603 12/09/22 1401 12/10/22 0555  AST 13* '19 19 15  '$ ALT '7 14 14 13  '$ ALKPHOS 85 84 75 68  BILITOT 0.8 0.4 0.5 0.3  PROT 7.5 5.4* 5.3* 5.0*  ALBUMIN 4.1 2.6* 2.4* 2.3*   Recent Labs  Lab 12/06/22 1019  LIPASE <10*   No results for input(s): "AMMONIA" in the last 168 hours. Coagulation Profile: No results for input(s): "INR",  "PROTIME" in the last 168 hours. Cardiac Enzymes: No results for input(s): "CKTOTAL", "CKMB", "CKMBINDEX", "TROPONINI" in the last 168 hours. BNP (last 3 results) No results for input(s): "PROBNP" in the last 8760 hours. HbA1C: No results for input(s): "HGBA1C" in the last 72 hours. CBG: No results for input(s): "GLUCAP" in the last 168 hours. Lipid Profile: No results for input(s): "CHOL", "HDL", "LDLCALC", "TRIG", "CHOLHDL", "LDLDIRECT" in the last 72 hours. Thyroid Function Tests: No results for input(s): "TSH", "T4TOTAL", "FREET4", "T3FREE", "THYROIDAB" in the last 72 hours. Anemia Panel: No results for input(s): "VITAMINB12", "FOLATE", "FERRITIN", "TIBC", "IRON", "RETICCTPCT" in the last 72 hours. Sepsis Labs: Recent Labs  Lab 12/06/22 1031  LATICACIDVEN 0.9    No results found for this or any previous visit (from the past 240 hour(s)).       Radiology Studies: No results found.      Scheduled Meds:  amiodarone  200 mg Oral Daily   apixaban  5 mg Oral BID   Chlorhexidine Gluconate Cloth  6 each Topical Daily   feeding supplement  237 mL Oral BID BM   gabapentin  100 mg Oral BID   levothyroxine  225 mcg Oral Q0600   metoprolol succinate  12.5 mg Oral Daily   pantoprazole  40 mg Oral Daily   polyethylene glycol  17 g Oral Daily   Continuous Infusions:  sodium chloride 125 mL/hr at 12/10/22 [id number]          Aline August, MD [facility] 12/10/2022, 10:45 AM

## 2022-12-10 NOTE — Progress Notes (Signed)
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  No known primary tumor site, but we suspect a gynecologic primary.  She was admitted 12/06/2022 with increased abdominal pain.  She reports the pain improved after hospital admission, but is worse again this morning.  She is having bowel movements.  A CT on hospital admission revealed no acute finding and no evidence of progressive cancer.  Objective: Vital signs in last 24 hours: Blood pressure (!) 142/53, pulse 63, temperature 98.3 F (36.8 C), temperature source Oral, resp. rate 16, height '5\' 6"'$  (1.676 m), weight 204 lb 12.9 oz (92.9 kg), SpO2 97 %.  Intake/Output from previous day: 03/05 0701 - 03/06 0700 In: 3084 [P.O.:360; I.V.:2724] Out: 2950 [Urine:2950]  Physical Exam:  HEENT: No thrush Lungs: Clear bilaterally Cardiac: Regular rate and rhythm Abdomen: Mild tenderness throughout the abdomen, no mass, no hepatosplenomegaly Extremities: No leg edema   Lab Results: Recent Labs    12/09/22 0603 12/10/22 0555  WBC 9.1 6.7  HGB 10.1* 9.1*  HCT 32.8* 31.1*  PLT 237 231    BMET Recent Labs    12/09/22 1401 12/10/22 0555  NA 125* 131*  K 4.5 4.7  CL 100 106  CO2 21* 20*  GLUCOSE 124* 97  BUN 26* 19  CREATININE 1.76* 1.28*  CALCIUM 7.5* 7.7*    Lab Results  Component Value Date   CEA1 1.1 11/03/2022   [id number] 20 11/03/2022   Medications: I have reviewed the patient's current medications.  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 Umbilical hernia sac excision and omental biopsy 10/30/2022-moderately differentiated adenocarcinoma, AE1/AE3, CK7, PAX8, WT1, and ER positive CT abdomen/pelvis 10/07/2022-small to moderate fat-containing umbilical hernia with associated stranding of the contained fat, nonspecific scattered misty mesentery of the small bowel CT abdomen/pelvis  10/27/2022-moderate fat-containing periumbilical hernia with persistent increased density, small volume free fluid and mesenteric stranding CT chest 11/03/2022-no evidence of metastatic disease, mild focal groundglass opacity in the left upper lobe likely infectious or inflammatory, small bilateral pleural effusions and bibasilar atelectasis Mild elevation of the CA125 Abdominal pain-progressive History of congestive heart failure History of atrial fibrillation, right bundle branch block, 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  No primary tumor site was identified on initial CT imaging, but the immunohistochemical profile of the omental biopsy is consistent with a gynecologic primary.  [name] is now admitted with increased abdominal pain.  A CT does not reveal evidence of disease progression.  Pain could be related to progression of cancer not seen on the CT or another abdominal process.  We will follow-up on the CA125 and consider an outpatient PET.  We can consider a trial of tamoxifen or possibly outpatient chemotherapy.  I will see her again in the a.m. on 12/11/2022.   Recommendations: Continue hydrocodone and morphine as needed for pain Bowel regimen Follow-up CA125 Outpatient PET scan Outpatient follow-up will be scheduled at the Cancer center    LOS: 3 days   Betsy Coder, MD   12/10/2022, 1:37 PM

## 2022-12-10 NOTE — Consult Note (Signed)
Consultation Note Date: 12/10/2022   Patient Name: [name]  DOB: December 27, 1930  MRN: [id number]  Age / Sex: 89 y.o., female  PCP: Deon Pilling, NP Referring Physician: Aline August, MD  Reason for Consultation:  "goals of care."  HPI/Patient Profile: 89 y.o. 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   Primary Decision Maker PATIENT  Discussion: I have reviewed medical records including Care Everywhere, progress notes from this and prior admissions, labs and imaging, discussed with RN.  On evaluation patient is awake and alert, oriented. Very hard of hearing. Her daughter is at bedside.   Lives in  her home with her daughter. She is able to ambulate at home, bathe, and dress herself. She enjoys crocheting.   We discussed patient's current illness and what it means in the larger context of patient's on-going co-morbidities.  Natural disease trajectory and expectations at EOL were discussed. She and her daughter understand that she has cancer- she has expressed that she does not want and likely would not be a candidate for aggressive treatment. However, they are considering possible hormone therapy due to cancer was estrogen positive.   Currently their goals of care are to find out more information about her cancer via PET scan. Pre-authorization is in progress.   Discussed with patient/family the importance of continued conversation with family and the medical providers regarding overall plan of care and treatment options, ensuring decisions are within the context of the patient's values and GOCs.  Answered questions about hospice- they are familiar with hospice due to hospice's  involvement with other family member.     Reviewed her pain- she has lower abdominal pain that is worse with movements. Sharp pain, pressure, tender to palpation. Relieved with movement, felt better while standing. Current pain regimen helps relieve pain.   She has not had a bowel movement since last Friday- 3/1. They are concerned about being very aggressive with bowel management due to her labored mobility. We discussed how constipation can increase any visceral abdominal pain caused by the cancer- she had evidence of small amount of free fluid and mesenteric edema on the CT scan and addressing constipation can help relieve some of this pressure.    SUMMARY OF RECOMMENDATIONS -[place]- obtain more information regarding cancer via PET scan per recommendations of Dr. Marin Olp, awaiting pre-authorization, want to speak with Dr. Benay Spice re: hormone therapy- does not want surgery or chemotherapy, if she chooses not to proceed with hormone therapy then interested in invoking hospice benefit, will refer for outpatient Palliative if she chooses to start hormone treatment -Symptom management- recommend treating constipation- 2 senna po tonight, if no BM tomorrow then recommend starting Miralax- daughter also plans to give her Mom prune juice; dexamethasone '10mg'$  IV x1 tomorrow morning and the dexamethasone '4mg'$  po x 3 days for nausea and abdominal visceral inflammation  -She  recently recovered from urinary tract infection- will send urine for UA   Code Status/Advance Care Planning: DNR   Prognosis:   Unable to determine  Discharge Planning: To Be Determined  Primary Diagnoses: Present on Admission:  Intractable abdominal pain  Hypothyroidism  Paroxysmal atrial fibrillation (HCC)  Metastatic adenocarcinoma of unknown origin (Onset)  (HFpEF) heart failure with preserved ejection fraction (HCC)  Chronic kidney disease, stage 3b (HCC)  HLD (hyperlipidemia)   Review of Systems  Physical Exam  Vital  Signs: BP (!) 142/53 (BP Location: Left Arm)   Pulse 63   Temp 98.3 F (36.8 C) (Oral)   Resp 16   Ht '5\' 6"'$  (1.676 m)   Wt 92.9 kg   SpO2 97%   BMI 33.06 kg/m  Pain Scale: 0-10   Pain Score: 6    SpO2: SpO2: 97 % O2 Device:SpO2: 97 % O2 Flow Rate: .O2 Flow Rate (L/min): 4 L/min  IO: Intake/output summary:  Intake/Output Summary (Last 24 hours) at 12/10/2022 1325 Last data filed at 12/10/2022 0615 Gross per 24 hour  Intake 2844 ml  Output 2400 ml  Net 444 ml    LBM: Last BM Date : 12/06/22 Baseline Weight: Weight: 93.7 kg Most recent weight: Weight: 92.9 kg       Thank you for this consult. Palliative medicine will continue to follow and assist as needed.  Time Total: 90 minutes  Greater than 50%  of this time was spent counseling and coordinating care related to the above assessment and plan.  Signed by: Mariana Kaufman, AGNP-C Palliative Medicine    Please contact Palliative Medicine Team phone at [phone] for questions and concerns.  For individual provider: See Shea Evans

## 2022-12-10 NOTE — Evaluation (Signed)
Physical Therapy Evaluation Patient Details Name: DESTYN WADDY MRN: [id number] DOB: 03-19-1931 Today's Date: 12/10/2022  History of Present Illness  89 year old female with recently diagnosed metastatic adenocarcinoma involving umbilical hernia sac and omentum suspected from ovarian primary versus primary peritoneal carcinoma, PAF on Eliquis status post PPM, chronic diastolic heart failure, CKD stage IIIb, hypothyroidism, hyperlipidemia, anxiety presented with worsening abdominal pain and nausea. CTA abdomen/pelvis negative for evidence for acute mesenteric ischemia. Mild approximately 20% stenosis at the origin of the left renal artery secondary to calcified plaque noted. Persistent diffuse mesenteric edema with small volume free fluid within the mesentery and along the peritoneal reflection is noted.  Clinical Impression  Pt admitted with above diagnosis. Pt from home with daughter, ind with amb using rollator and self care, daughter completing household chores and present for supv. Pt with abdominal pain in supine and worst in sitting, reports relief in standing; also noted belching in standing. Pt taking a few steps over to recliner in room, very slow and steady steps with RW with flat foot posture and minimal bil foot clearance. Pt active with HHPT for ~1 month; recommend resume HHPT and 24/7 support at home. Pt currently with functional limitations due to the deficits listed below (see PT Problem List). Pt will benefit from skilled PT to increase their independence and safety with mobility to allow discharge to the venue listed below.          Recommendations for follow up therapy are one component of a multi-disciplinary discharge planning process, led by the attending physician.  Recommendations may be updated based on patient status, additional functional criteria and insurance authorization.  Follow Up Recommendations Home health PT      Assistance Recommended at Discharge Frequent or  constant Supervision/Assistance  Patient can return home with the following  A little help with walking and/or transfers;A little help with bathing/dressing/bathroom;Assistance with cooking/housework;Assist for transportation;Help with stairs or ramp for entrance;Direct supervision/assist for medications management    Equipment Recommendations None recommended by PT  Recommendations for Other Services       Functional Status Assessment Patient has had a recent decline in their functional status and demonstrates the ability to make significant improvements in function in a reasonable and predictable amount of time.     Precautions / Restrictions Precautions Precautions: Fall Restrictions Weight Bearing Restrictions: No      Mobility  Bed Mobility Overal bed mobility: Needs Assistance Bed Mobility: Supine to Sit  Supine to sit: Min assist  General bed mobility comments: pt pulling up on therapist's hand to upright trunk into sitting and scoot hips forward; pt sleeps in lift chair at baseline    Transfers Overall transfer level: Needs assistance Equipment used: Rolling walker (2 wheels) Transfers: Sit to/from Stand Sit to Stand: Min guard  General transfer comment: pt powers up to standing with BUE assisting, educated to avoid breath holding    Ambulation/Gait Ambulation/Gait assistance: Min guard Gait Distance (Feet): 3 Feet  Gait Pattern/deviations: Step-to pattern, Decreased stride length Gait velocity: decreased  General Gait Details: pt takes steps in room to recliner with RW, minimal bil foot clearance, very slow cadence, flat foot posture  Stairs            Wheelchair Mobility    Modified Rankin (Stroke Patients Only)       Balance Overall balance assessment: Mild deficits observed, not formally tested        Pertinent Vitals/Pain Pain Assessment Pain Assessment: 0-10 Pain Score: 3  Pain  Location: abdomen Pain Descriptors / Indicators:  Grimacing Pain Intervention(s): Limited activity within patient's tolerance, Monitored during session    Home Living Family/patient expects to be discharged to:: Private residence Living Arrangements: Children Available Help at Discharge: Family;Available 24 hours/day Type of Home: House Home Access: Stairs to enter Entrance Stairs-Rails: None Entrance Stairs-Number of Steps: 1   Home Layout: One level Home Equipment: Rollator (4 wheels);BSC/3in1;Grab bars - tub/shower;Shower seat Additional Comments: daughter currently living with patient, providing care day & night; active with HHPT for ~1 month    Prior Function Prior Level of Function : Needs assist  Mobility Comments: Pt is mobile with rollator in the home without assistance from daughter. ADLs Comments: Daughter assisting with med management, finances, cooking, cleaning - patient able to grossly perform ADLs     Hand Dominance   Dominant Hand: Right    Extremity/Trunk Assessment   Upper Extremity Assessment Upper Extremity Assessment: Defer to OT evaluation    Lower Extremity Assessment Lower Extremity Assessment: Generalized weakness (AROM WFL, strength grossly 3+/5, denies numbness/tingling throughout BLE)    Cervical / Trunk Assessment Cervical / Trunk Assessment: Normal  Communication   Communication: HOH  Cognition Arousal/Alertness: Awake/alert Behavior During Therapy: WFL for tasks assessed/performed Overall Cognitive Status: Within Functional Limits for tasks assessed  General Comments: HOH        General Comments      Exercises     Assessment/Plan    PT Assessment Patient needs continued PT services  PT Problem List Decreased strength;Decreased activity tolerance;Decreased balance;Decreased mobility;Pain       PT Treatment Interventions DME instruction;Gait training;Functional mobility training;Therapeutic activities;Therapeutic exercise;Balance training;Neuromuscular  re-education;Patient/family education    PT Goals (Current goals can be found in the Care Plan section)  Acute Rehab PT Goals Patient Stated Goal: less pain PT Goal Formulation: With patient/family Time For Goal Achievement: 12/24/22 Potential to Achieve Goals: Good    Frequency Min 3X/week     Co-evaluation               AM-PAC PT "6 Clicks" Mobility  Outcome Measure Help needed turning from your back to your side while in a flat bed without using bedrails?: A Little Help needed moving from lying on your back to sitting on the side of a flat bed without using bedrails?: A Little Help needed moving to and from a bed to a chair (including a wheelchair)?: A Little Help needed standing up from a chair using your arms (e.g., wheelchair or bedside chair)?: A Little Help needed to walk in hospital room?: A Lot Help needed climbing 3-5 steps with a railing? : A Lot 6 Click Score: 16    End of Session   Activity Tolerance: Patient tolerated treatment well Patient left: in chair;with call bell/phone within reach;with family/visitor present;with nursing/sitter in room Nurse Communication: Mobility status PT Visit Diagnosis: Other abnormalities of gait and mobility (R26.89);Muscle weakness (generalized) (M62.81)    Time: [id number] PT Time Calculation (min) (ACUTE ONLY): 18 min   Charges:   PT Evaluation $PT Eval Low Complexity: 1 Low           Tori Juliona Vales PT, DPT 12/10/22, 1:50 PM

## 2022-12-10 NOTE — Evaluation (Signed)
Occupational Therapy Evaluation Patient Details Name: KRITHIKA ROMANIELLO MRN: [id number] DOB: Mar 02, 1931 Today's Date: 12/10/2022   History of Present Illness 89 year old female with recently diagnosed metastatic adenocarcinoma involving umbilical hernia sac and omentum suspected from ovarian primary versus primary peritoneal carcinoma, PAF on Eliquis status post PPM, chronic diastolic heart failure, CKD stage IIIb, hypothyroidism, hyperlipidemia, anxiety presented with worsening abdominal pain and nausea.  On presentation, lipase was less than 10, UA was negative for UTI. CTA abdomen/pelvis negative for evidence for acute mesenteric ischemia. Mild approximately 20% stenosis at the origin of the left renal artery secondary to calcified plaque noted. Persistent diffuse mesenteric edema with small volume free fluid within the mesentery and along the peritoneal reflection is noted.   Clinical Impression   Mr. Kaisee Melillo is an 89 year old woman who presents with abdominal pain and decreased activity tolerance resulting in decreased ability to perform ADLs. She is overall min guard to stand and take steps to recliner and needs mod assist for LB dressing due to pain. Patient will benefit from skilled OT services while in hospital to improve deficits and learn compensatory strategies as needed in order to return to PLOF.       Recommendations for follow up therapy are one component of a multi-disciplinary discharge planning process, led by the attending physician.  Recommendations may be updated based on patient status, additional functional criteria and insurance authorization.   Follow Up Recommendations  No OT follow up     Assistance Recommended at Discharge Frequent or constant Supervision/Assistance  Patient can return home with the following A little help with walking and/or transfers;A lot of help with bathing/dressing/bathroom;Assistance with cooking/housework;Assist for transportation;Help  with stairs or ramp for entrance    Functional Status Assessment  Patient has had a recent decline in their functional status and demonstrates the ability to make significant improvements in function in a reasonable and predictable amount of time.  Equipment Recommendations  None recommended by OT    Recommendations for Other Services       Precautions / Restrictions Precautions Precautions: Fall Restrictions Weight Bearing Restrictions: No      Mobility Bed Mobility Overal bed mobility: Needs Assistance Bed Mobility: Supine to Sit     Supine to sit: Min assist          Transfers Overall transfer level: Needs assistance Equipment used: Rolling walker (2 wheels) Transfers: Sit to/from Stand, Bed to chair/wheelchair/BSC Sit to Stand: Min guard     Step pivot transfers: Min guard            Balance Overall balance assessment: Mild deficits observed, not formally tested                                         ADL either performed or assessed with clinical judgement   ADL Overall ADL's : Needs assistance/impaired Eating/Feeding: Set up   Grooming: Set up;Sitting   Upper Body Bathing: Set up;Sitting   Lower Body Bathing: Moderate assistance;Sit to/from stand   Upper Body Dressing : Set up;Sitting   Lower Body Dressing: Moderate assistance;Sit to/from stand   Toilet Transfer: Min guard;BSC/3in1;Rolling walker (2 wheels)   Toileting- Clothing Manipulation and Hygiene: Minimal assistance;Sit to/from stand       Functional mobility during ADLs: Min guard;Rolling walker (2 wheels)       Vision Patient Visual Report: No change from baseline  Perception     Praxis      Pertinent Vitals/Pain Pain Assessment Pain Assessment: 0-10 Pain Score: 3  Pain Location: abdomen Pain Descriptors / Indicators: Grimacing Pain Intervention(s): Monitored during session     Hand Dominance Right   Extremity/Trunk Assessment Upper  Extremity Assessment Upper Extremity Assessment: Overall WFL for tasks assessed   Lower Extremity Assessment Lower Extremity Assessment: Defer to PT evaluation   Cervical / Trunk Assessment Cervical / Trunk Assessment: Normal   Communication Communication Communication: HOH   Cognition Arousal/Alertness: Awake/alert Behavior During Therapy: WFL for tasks assessed/performed Overall Cognitive Status: Within Functional Limits for tasks assessed                                 General Comments: HOH     General Comments       Exercises     Shoulder Instructions      Home Living Family/patient expects to be discharged to:: Private residence Living Arrangements: Children Available Help at Discharge: Family;Available 24 hours/day Type of Home: House Home Access: Stairs to enter [employer] of Steps: 1 Entrance Stairs-Rails: None Home Layout: One level     Bathroom Shower/Tub: Chief Strategy Officer: Rollator (4 wheels);BSC/3in1;Grab bars - tub/shower;Shower seat   Additional Comments: daughter currently living with patient, providing care day & night      Prior Functioning/Environment Prior Level of Function : Needs assist             Mobility Comments: Pt is mobile with rollator without assistance from daughter. ADLs Comments: Daughter assisting with med management, finances, cooking, cleaning - patient able to grossly perform ADLs        OT Problem List: Pain;Decreased activity tolerance      OT Treatment/Interventions: Self-care/ADL training;Therapeutic exercise;DME and/or AE instruction;Therapeutic activities;Balance training;Patient/family education    OT Goals(Current goals can be found in the care plan section) Acute Rehab OT Goals Patient Stated Goal: get better and return home OT Goal Formulation: With patient/family Time For Goal Achievement: 12/24/22 Potential to Achieve Goals: Fair  OT Frequency: Min  2X/week    Co-evaluation              AM-PAC OT "6 Clicks" Daily Activity     Outcome Measure Help from another person eating meals?: A Little Help from another person taking care of personal grooming?: A Little Help from another person toileting, which includes using toliet, bedpan, or urinal?: A Little Help from another person bathing (including washing, rinsing, drying)?: A Lot Help from another person to put on and taking off regular upper body clothing?: A Little Help from another person to put on and taking off regular lower body clothing?: A Lot 6 Click Score: 16   End of Session Equipment Utilized During Treatment: Rolling walker (2 wheels) Nurse Communication: Mobility status  Activity Tolerance: Patient limited by pain Patient left: in chair;with call bell/phone within reach;with family/visitor present  OT Visit Diagnosis: Pain                Time: [id number] OT Time Calculation (min): 10 min Charges:  OT General Charges $OT Visit: 1 Visit OT Evaluation $OT Eval Low Complexity: 1 Low  Gustavo Lah, OTR/L [place]  Office [phone]   Lenward Chancellor 12/10/2022, 1:39 PM

## 2022-12-11 ENCOUNTER — Telehealth: Payer: Self-pay

## 2022-12-11 DIAGNOSIS — Z7189 Other specified counseling: Secondary | ICD-10-CM | POA: Diagnosis not present

## 2022-12-11 DIAGNOSIS — C799 Secondary malignant neoplasm of unspecified site: Secondary | ICD-10-CM | POA: Diagnosis not present

## 2022-12-11 DIAGNOSIS — R109 Unspecified abdominal pain: Secondary | ICD-10-CM | POA: Diagnosis not present

## 2022-12-11 LAB — BASIC METABOLIC PANEL
Anion gap: 6 (ref 5–15)
BUN: 16 mg/dL (ref 8–23)
CO2: 20 mmol/L — ABNORMAL LOW (ref 22–32)
Calcium: 7.9 mg/dL — ABNORMAL LOW (ref 8.9–10.3)
Chloride: 104 mmol/L (ref 98–111)
Creatinine, Ser: 0.96 mg/dL (ref 0.44–1.00)
GFR, Estimated: 57 mL/min — ABNORMAL LOW (ref >60)
Glucose, Bld: 98 mg/dL (ref 70–99)
Potassium: 4.3 mmol/L (ref 3.5–5.1)
Sodium: 130 mmol/L — ABNORMAL LOW (ref 135–145)

## 2022-12-11 LAB — MAGNESIUM: Magnesium: 1.8 mg/dL (ref 1.7–2.4)

## 2022-12-11 LAB — CA 125: Cancer Antigen (CA) 125: 85.9 U/mL — ABNORMAL HIGH (ref 0.0–38.1)

## 2022-12-11 MED ORDER — SODIUM CHLORIDE 0.9 % IV SOLN
1.0000 g | INTRAVENOUS | Status: DC
  Administered 2022-12-11 – 2022-12-13 (×3): 1 g via INTRAVENOUS
  Filled 2022-12-11 (×3): qty 10

## 2022-12-11 MED ORDER — ONDANSETRON 4 MG PO TBDP
4.0000 mg | ORAL_TABLET | Freq: Three times a day (TID) | ORAL | Status: DC | PRN
  Administered 2022-12-15: 4 mg via ORAL
  Filled 2022-12-11: qty 1

## 2022-12-11 MED ORDER — PANTOPRAZOLE SODIUM 40 MG PO TBEC
40.0000 mg | DELAYED_RELEASE_TABLET | Freq: Two times a day (BID) | ORAL | Status: DC
  Administered 2022-12-11 – 2022-12-16 (×10): 40 mg via ORAL
  Filled 2022-12-11 (×10): qty 1

## 2022-12-11 MED ORDER — SENNA 8.6 MG PO TABS
2.0000 | ORAL_TABLET | Freq: Two times a day (BID) | ORAL | Status: DC
  Administered 2022-12-11 – 2022-12-12 (×3): 17.2 mg via ORAL
  Filled 2022-12-11 (×3): qty 2

## 2022-12-11 MED ORDER — HYDROCODONE-ACETAMINOPHEN 5-325 MG PO TABS
1.0000 | ORAL_TABLET | ORAL | Status: DC | PRN
  Administered 2022-12-11 – 2022-12-14 (×13): 1 via ORAL
  Filled 2022-12-11 (×13): qty 1

## 2022-12-11 NOTE — Progress Notes (Signed)
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  On presentation, lipase was less than 10, UA was negative for UTI. CTA abdomen/pelvis negative for evidence for acute mesenteric ischemia. Mild approximately 20% stenosis at the origin of the left renal artery secondary to calcified plaque noted. Persistent diffuse mesenteric edema with small volume free fluid within the mesentery and along the peritoneal reflection is noted. No signs of abscess or pneumoperitoneum. Unchanged small hiatal hernia. Small hiatal hernia, unchanged.  She was given IV fluids and analgesics.  Oncology was consulted.  Assessment & Plan:   Intractable generalized abdominal pain with nausea -Imaging as above.  Questionable cause: Unsure if this is cancer-related pain.  Still having intermittent abdominal pain with nausea.  Right upper quadrant ultrasound on 10/28/2022 had shown no evidence of acute cholecystitis or biliary obstruction -Continue current pain management.  Tolerating some diet. -Continue Protonix IV twice a day.  Continue Carafate along with Maalox as needed -Will empirically start IV Rocephin  Possible UTI: Possibly present on admission -UA suggestive of UTI.  Check cultures.  Start Rocephin.  Metastatic adenocarcinoma involving umbilical hernia and omentum -Suspicion is for ovarian primary versus primary peritoneal carcinoma.  Oncology following.  Oncology wants to have inpatient PET scan done. -Patient is declining chemotherapy or any further surgery. -Consult palliative care for goals  of care discussion  Chronic diastolic heart failure Mild aortic regurgitation Hypertension -Currently compensated.  Continue metoprolol succinate.  Aldactone/Entresto/Lasix on hold.  Tachybradycardia syndrome status post PPM placement PAF on Eliquis -Currently rate controlled.  Continue amiodarone, Eliquis and metoprolol succinate.  Outpatient follow-up with cardiology  AKI on chronic kidney disease stage IIIb -Creatinine improving to 0.96 today, peaked to 1.97.  DC IV fluids today -Repeat a.m. labs  Hyponatremia -Possibly hypovolemic.  Sodium 130 today.  Repeat a.m. labs.  IV fluids as above  Acute urinary retention -Foley catheter placed on 12/09/2022.  Continue Flomax.  Will need outpatient urology follow-up  Anxiety -Continue gabapentin.  Ativan stopped because of somnolence.  Obesity -Outpatient follow-up  Goals of care -Palliative care following  DVT prophylaxis: Eliquis Code Status: DNR Family Communication: Daughter at bedside Disposition Plan: Status is: Inpatient Remains inpatient appropriate because: Of severity of illness.   Consultants: Oncology.  palliative care  Procedures: None  Antimicrobials: None   Subjective: Patient seen and examined at bedside.  Still having intermittent abdominal pain.  No fever, chest pain or shortness of breath reported.  Daughter at bedside.  Objective: Vitals:   12/10/22 0609 12/10/22 1500 12/10/22 2049 12/11/22 0606  BP: (!) 142/53 (!) 114/50 (!) 110/49 (!) 120/47  Pulse: 63 (!) 59 64 60  Resp: '16 19 14 14  '$ Temp: 98.3 F (36.8 C) 97.7 F (36.5 C) 98.2 F (36.8 C) 97.8 F (36.6 C)  TempSrc: Oral Oral Oral Oral  SpO2: 97% 94% 95% 94%  Weight: 92.9 kg   95.4 kg  Height:        Intake/Output Summary (Last 24 hours) at 12/11/2022 0749 Last data filed at 12/11/2022 0612 Gross per 24 hour  Intake 2184.14 ml  Output 1250 ml  Net 934.14 ml    [employer]  12/09/22 1000 12/10/22 0609 12/11/22 0606  Weight: 93.7  kg 92.9 kg 95.4 kg    Examination:  General: On room air.  No distress.  Chronically ill and deconditioned looking. ENT/neck: No thyromegaly.  JVD is not elevated  respiratory: Decreased breath sounds at bases bilaterally with some crackles; no wheezing  CVS: S1-S2 heard, rate controlled currently Abdominal: Soft, obese, mildly tender in the upper quadrant, slightly distended; no organomegaly, normal bowel sounds are heard Extremities: Trace lower extremity edema; no cyanosis  CNS: Awake and alert.  Still slow to respond.  Poor historian.  No focal neurologic deficit.  Moves extremities Lymph: No obvious lymphadenopathy Skin: No obvious ecchymosis/lesions  psych: Mostly flat affect.  Currently not agitated  musculoskeletal: No obvious joint swelling/deformity Genitourinary: Foley catheter is present   Data Reviewed: I have personally reviewed following labs and imaging studies  CBC: Recent Labs  Lab 12/06/22 1019 12/07/22 0720 12/09/22 0603 12/10/22 0555  WBC 7.1 9.1 9.1 6.7  NEUTROABS  --   --  6.9 4.7  HGB 13.2 11.3* 10.1* 9.1*  HCT 40.5 36.5 32.8* 31.1*  MCV 87.9 90.6 92.4 95.1  PLT 220 253 237 [id number]    Basic Metabolic Panel: Recent Labs  Lab 12/07/22 0720 12/09/22 0603 12/09/22 1401 12/10/22 0555 12/11/22 0600  NA 133* 125* 125* 131* 130*  K 4.7 4.2 4.5 4.7 4.3  CL 101 96* 100 106 104  CO2 22 20* 21* 20* 20*  GLUCOSE 90 122* 124* 97 98  BUN 20 29* 26* 19 16  CREATININE 1.45* 1.97* 1.76* 1.28* 0.96  CALCIUM 8.5* 7.9* 7.5* 7.7* 7.9*  MG  --  1.7  --   --  1.8    GFR: Estimated Creatinine Clearance: 46.2 mL/min (by C-G formula based on SCr of 0.96 mg/dL). Liver Function Tests: Recent Labs  Lab 12/06/22 1019 12/09/22 0603 12/09/22 1401 12/10/22 0555  AST 13* '19 19 15  '$ ALT '7 14 14 13  '$ ALKPHOS 85 84 75 68  BILITOT 0.8 0.4 0.5 0.3  PROT 7.5 5.4* 5.3* 5.0*  ALBUMIN 4.1 2.6* 2.4* 2.3*    Recent Labs  Lab 12/06/22 1019  LIPASE <10*    No results  for input(s): "AMMONIA" in the last 168 hours. Coagulation Profile: No results for input(s): "INR", "PROTIME" in the last 168 hours. Cardiac Enzymes: No results for input(s): "CKTOTAL", "CKMB", "CKMBINDEX", "TROPONINI" in the last 168 hours. BNP (last 3 results) No results for input(s): "PROBNP" in the last 8760 hours. HbA1C: No results for input(s): "HGBA1C" in the last 72 hours. CBG: No results for input(s): "GLUCAP" in the last 168 hours. Lipid Profile: No results for input(s): "CHOL", "HDL", "LDLCALC", "TRIG", "CHOLHDL", "LDLDIRECT" in the last 72 hours. Thyroid Function Tests: No results for input(s): "TSH", "T4TOTAL", "FREET4", "T3FREE", "THYROIDAB" in the last 72 hours. Anemia Panel: No results for input(s): "VITAMINB12", "FOLATE", "FERRITIN", "TIBC", "IRON", "RETICCTPCT" in the last 72 hours. Sepsis Labs: Recent Labs  Lab 12/06/22 1031  LATICACIDVEN 0.9     No results found for this or any previous visit (from the past 240 hour(s)).       Radiology Studies: No results found.      Scheduled Meds:  amiodarone  200 mg Oral Daily   apixaban  5 mg Oral BID   Chlorhexidine Gluconate Cloth  6 each Topical Daily   dexamethasone (DECADRON) injection  10 mg Intravenous Once   [START ON 12/13/2022] dexamethasone  4 mg Oral Daily   feeding supplement  237  mL Oral BID BM   gabapentin  100 mg Oral BID   levothyroxine  225 mcg Oral Q0600   metoprolol succinate  12.5 mg Oral Daily   pantoprazole (PROTONIX) IV  40 mg Intravenous Q12H   senna  2 tablet Oral QHS   sucralfate  1 g Oral TID WC & HS   tamsulosin  0.4 mg Oral QPC supper   Continuous Infusions:  sodium chloride 50 mL/hr at 12/11/22 0420          Aline August, MD [facility] 12/11/2022, 7:49 AM

## 2022-12-11 NOTE — Progress Notes (Signed)
Daily Progress Note   Patient Name: [name]       Date: 12/11/2022 DOB: 11-19-1930  Age: 89 y.o. MRN#: [id number] Attending Physician: Barbara August, MD Primary Care Physician: Barbara Pilling, NP Admit Date: 12/06/2022  Reason for Consultation/Follow-up: Establishing goals of care  Patient Profile/HPI:  89 y.o. 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   Subjective: Chart reviewed including labs, progress notes, imaging from this and previous encounters. Noted UA results suggestive of UTI and Rocephin has been started. [name] is awake and eating lunch. Daughter at bedside.  Pain is controlled with current regimen, but did have some increased pain with bathing.  No BM yet.  They discussed PET scan, tamoxifen with Dr. Benay [name].  [name] says her primary goal is to be at home, not in pain, and to be able to crochet.  She does not want chemotherapy. She would consider tamoxifen. She would consider a minor surgery if it was going to relieve her pain.  They are still trying to get her a PET scan.    Physical Exam Vitals and nursing note reviewed.  Constitutional:      General: She is not in acute distress. Cardiovascular:     Rate and Rhythm: Normal rate.  Pulmonary:     Effort: Pulmonary effort is normal.  Neurological:     Mental Status: She is alert and oriented to person, place, and time.             Vital Signs: BP (!) 120/47 (BP Location: Left Arm)   Pulse 60   Temp 97.8 F (36.6 C) (Oral)   Resp 14   Ht '5\' 6"'$  (1.676 m)   Wt 95.4 kg   SpO2 94%    BMI 33.95 kg/m  SpO2: SpO2: 94 % O2 Device: O2 Device: Room Air O2 Flow Rate: O2 Flow Rate (L/min): 4 L/min  Intake/output summary:  Intake/Output Summary (Last 24 hours) at 12/11/2022 1318 Last data filed at 12/11/2022 0900 Gross per 24 hour  Intake 2184.14 ml  Output 1250 ml  Net 934.14 ml   LBM: Last BM Date : 12/06/22 Baseline Weight: Weight: 93.7 kg  Most recent weight: Weight: 95.4 kg       Palliative Assessment/Data: 60%      Patient Active Problem List   Diagnosis Date Noted   Malnutrition of moderate degree 12/09/2022   Intractable abdominal pain 12/06/2022   Paroxysmal atrial fibrillation ([place]) 12/06/2022   Metastatic adenocarcinoma of unknown origin ([place]) 12/06/2022   (HFpEF) heart failure with preserved ejection fraction ([place]) 12/06/2022   Chronic kidney disease, stage 3b ([place]) 12/06/2022   Intractable nausea and vomiting 10/27/2022   Incarcerated hernia 10/27/2022   Dehydration 10/27/2022   Hyperkalemia [id number]   Umbilical hernia without obstruction or gangrene 10/27/2022   Chronic combined systolic and diastolic heart failure ([place]) 08/26/2022   Sick sinus syndrome ([place]) 05/28/2022   UTI (urinary tract infection) 05/28/2022   Acute on chronic systolic CHF (congestive heart failure) ([place]) 05/28/2022   Acute kidney injury superimposed on chronic kidney disease ([place]) 05/28/2022   Persistent atrial fibrillation ([place])    Acute encephalopathy 12/25/2021   HLD (hyperlipidemia) 12/25/2021   Class 1 obesity 12/11/2021   Community acquired pneumonia    Atrial fibrillation with RVR ([place]) 12/09/2021   Respiratory distress 12/08/2021   Normocytic anemia 12/08/2021   Respiratory distress secondary to acute exacerbation of congestive heart failure ([place]) 12/08/2021   Hyponatremia 12/08/2021   Hypothyroidism 12/08/2021   Essential hypertension 12/08/2021    Palliative Care Assessment & Plan    Assessment/Recommendations/Plan  Continue current pain  regimen Moving her bowels should assist in relieving some of her abdominal pain- she is due for dulcolax suppository today- does not want Miralax due to diarrhea last time she took it- we discussed that diarrhea unfortunately often occurs after constipation is relieved due to the back up in the intestines- recommend lactulose if no BM with suppository I contacted hospice liaison with question- re: eligible for hospice if she chose to take Tamoxifen? Patient's daughter prefers that providers speak to her Mom rather than rely on her for answers to treatment questions- [name] is oriented and decisional and can make her own decisions  Code Status: DNR  Prognosis:  Unable to determine  Discharge Planning: To Be Determined  Care plan was discussed with patient and her daughter  Thank you for allowing the Palliative Medicine Team to assist in the care of this patient.  Total time:  60 minutes  Greater than 50%  of this time was spent counseling and coordinating care related to the above assessment and plan.  [name], AGNP-C Palliative Medicine   Please contact Palliative Medicine Team phone at [phone] for questions and concerns.

## 2022-12-11 NOTE — Progress Notes (Addendum)
IP PROGRESS NOTE  Subjective:   [name] continues to have abdominal pain.  The pain is relieved with hydrocodone.  She is passing flatus.  No bowel movement.  Her daughter is at the bedside this morning.  Objective: Vital signs in last 24 hours: Blood pressure (!) 120/47, pulse 60, temperature 97.8 F (36.6 C), temperature source Oral, resp. rate 14, height '5\' 6"'$  (1.676 m), weight 210 lb 5.1 oz (95.4 kg), SpO2 94 %.  Intake/Output from previous day: 03/06 0701 - 03/07 0700 In: 2184.1 [P.O.:720; I.V.:1464.1] Out: 1250 [Urine:1250]  Physical Exam:  Abdomen: Distended, tender in the bilateral abdomen, no discrete mass Extremities: No leg edema   Lab Results: Recent Labs    12/09/22 0603 12/10/22 0555  WBC 9.1 6.7  HGB 10.1* 9.1*  HCT 32.8* 31.1*  PLT 237 231    BMET Recent Labs    12/10/22 0555 12/11/22 0600  NA 131* 130*  K 4.7 4.3  CL 106 104  CO2 20* 20*  GLUCOSE 97 98  BUN 19 16  CREATININE 1.28* 0.96  CALCIUM 7.7* 7.9*    Lab Results  Component Value Date   CEA1 1.1 11/03/2022   [id number] 20 11/03/2022   Medications: I have reviewed the patient's current medications.  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 Umbilical hernia sac excision and omental biopsy 10/30/2022-moderately differentiated adenocarcinoma, AE1/AE3, CK7, PAX8, WT1, and ER positive Foundation 1-FGFR1 amplification, HRD-not detected, MSS, tumor mutation burden 1 CT abdomen/pelvis 10/07/2022-small to moderate fat-containing umbilical hernia with associated stranding of the contained fat, nonspecific scattered misty mesentery of the small bowel CT abdomen/pelvis 10/27/2022-moderate fat-containing periumbilical hernia with persistent increased density, small volume free fluid and mesenteric stranding CT chest 11/03/2022-no evidence of metastatic disease, mild focal groundglass opacity in the left upper lobe likely infectious or inflammatory, small  bilateral pleural effusions and bibasilar atelectasis Mild elevation of the CA125 Abdominal pain-progressive History of congestive heart failure History of atrial fibrillation, right bundle branch block, 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  No primary tumor site was identified on initial CT imaging.  The immunohistochemical profile of the omental biopsy is consistent with a gynecologic primary.  Molecular testing does not reveal a targetable mutation and she does not appear to be a candidate for immunotherapy or a PARP inhibitor.  [name] is now admitted with increased abdominal pain.  A CT does not reveal evidence of disease progression.  Pain could be related to progression of cancer not seen on the CT or another abdominal process.  The CA125 remains mildly elevated.  I discussed the cancer diagnosis and treatment options with [name] and her daughter.  She understands it is likely the abdominal pain is related to carcinomatosis, but it is possible the pain is due to a different etiology.  The urinary tract infection may be contributing.  There is no apparent disease progression on the admission CT.  We can proceed with a staging PET scan, but these generally are not ordered in the hospital.  Her daughter f says her insurance company has approved an inpatient PET scan, but we will need to confirm with radiology.  I explained there is no need for a PET scan if she does not wish to receive chemotherapy or surgical intervention.  The chief goal at present is to improve  her pain.  She will continue hydrocodone and a bowel regimen.  We can consider adding a long-acting narcotic if she requires frequent hydrocodone dosing.  We discussed hospice care.  She could take  tamoxifen while enrolled in hospice, but not chemotherapy.  There is a small chance of clinical benefit with tamoxifen.   Recommendations: Continue hydrocodone and morphine as needed for pain optimize hydrocodone dosing Bowel regimen PET scan-most likely to be scheduled as an outpatient Discharge to home when her pain is adequately controlled and she is having bowel movements I will check on her 12/12/2022 and outpatient follow-up will be scheduled at the Cancer center. Antibiotics for UTI    LOS: 4 days   Betsy Coder, MD   12/11/2022, 1:31 PM

## 2022-12-11 NOTE — Care Management Important Message (Signed)
Important Message  Patient Details IM Letter given. Name: [name] MRN: [id number] Date of Birth: June 16, 1931   Medicare Important Message Given:  Yes     Kerin Salen 12/11/2022, 9:59 AM

## 2022-12-11 NOTE — Telephone Encounter (Signed)
Patient's daughter called and report that her mother would like hospice and to get her pain uncontrolled. No, PET scan wont to be done. I let the daughter know  the provider will be doing around in the morning and she can spoke with him then. I made Dr. Benay Spice aware of the patient wishes.

## 2022-12-12 ENCOUNTER — Encounter: Payer: Self-pay | Admitting: *Deleted

## 2022-12-12 DIAGNOSIS — I480 Paroxysmal atrial fibrillation: Secondary | ICD-10-CM | POA: Diagnosis not present

## 2022-12-12 DIAGNOSIS — R109 Unspecified abdominal pain: Secondary | ICD-10-CM | POA: Diagnosis not present

## 2022-12-12 DIAGNOSIS — C799 Secondary malignant neoplasm of unspecified site: Secondary | ICD-10-CM | POA: Diagnosis not present

## 2022-12-12 LAB — BASIC METABOLIC PANEL
Anion gap: 7 (ref 5–15)
BUN: 20 mg/dL (ref 8–23)
CO2: 20 mmol/L — ABNORMAL LOW (ref 22–32)
Calcium: 8.3 mg/dL — ABNORMAL LOW (ref 8.9–10.3)
Chloride: 101 mmol/L (ref 98–111)
Creatinine, Ser: 0.98 mg/dL (ref 0.44–1.00)
GFR, Estimated: 55 mL/min — ABNORMAL LOW (ref >60)
Glucose, Bld: 129 mg/dL — ABNORMAL HIGH (ref 70–99)
Potassium: 4.8 mmol/L (ref 3.5–5.1)
Sodium: 128 mmol/L — ABNORMAL LOW (ref 135–145)

## 2022-12-12 LAB — CBC WITH DIFFERENTIAL/PLATELET
Abs Immature Granulocytes: 0.15 10*3/uL — ABNORMAL HIGH (ref 0.00–0.07)
Basophils Absolute: 0 10*3/uL (ref 0.0–0.1)
Basophils Relative: 0 %
Eosinophils Absolute: 0 10*3/uL (ref 0.0–0.5)
Eosinophils Relative: 0 %
HCT: 29.5 % — ABNORMAL LOW (ref 36.0–46.0)
Hemoglobin: 9.4 g/dL — ABNORMAL LOW (ref 12.0–15.0)
Immature Granulocytes: 2 %
Lymphocytes Relative: 8 %
Lymphs Abs: 0.6 10*3/uL — ABNORMAL LOW (ref 0.7–4.0)
MCH: 28.2 pg (ref 26.0–34.0)
MCHC: 31.9 g/dL (ref 30.0–36.0)
MCV: 88.6 fL (ref 80.0–100.0)
Monocytes Absolute: 0.3 10*3/uL (ref 0.1–1.0)
Monocytes Relative: 4 %
Neutro Abs: 6.8 10*3/uL (ref 1.7–7.7)
Neutrophils Relative %: 86 %
Platelets: 305 10*3/uL (ref 150–400)
RBC: 3.33 MIL/uL — ABNORMAL LOW (ref 3.87–5.11)
RDW: 13.9 % (ref 11.5–15.5)
WBC: 7.9 10*3/uL (ref 4.0–10.5)
nRBC: 0 % (ref 0.0–0.2)

## 2022-12-12 LAB — MAGNESIUM: Magnesium: 1.8 mg/dL (ref 1.7–2.4)

## 2022-12-12 MED ORDER — SENNOSIDES-DOCUSATE SODIUM 8.6-50 MG PO TABS
2.0000 | ORAL_TABLET | Freq: Two times a day (BID) | ORAL | Status: DC
  Administered 2022-12-12: 2 via ORAL
  Filled 2022-12-12: qty 2

## 2022-12-12 MED ORDER — LACTULOSE 10 GM/15ML PO SOLN
20.0000 g | Freq: Two times a day (BID) | ORAL | Status: DC
  Administered 2022-12-12 (×2): 20 g via ORAL
  Filled 2022-12-12 (×2): qty 30

## 2022-12-12 MED ORDER — TAMOXIFEN CITRATE 10 MG PO TABS
20.0000 mg | ORAL_TABLET | Freq: Every day | ORAL | Status: DC
  Administered 2022-12-12 – 2022-12-16 (×5): 20 mg via ORAL
  Filled 2022-12-12 (×5): qty 2

## 2022-12-12 MED ORDER — FUROSEMIDE 10 MG/ML IJ SOLN
40.0000 mg | Freq: Once | INTRAMUSCULAR | Status: AC
  Administered 2022-12-12: 40 mg via INTRAVENOUS
  Filled 2022-12-12: qty 4

## 2022-12-12 NOTE — TOC Initial Note (Signed)
Transition of Care [facility]) - Initial/Assessment Note    Patient Details  Name: [name] MRN: [id number] Date of Birth: [birth year]  Transition of Care [facility]) CM/SW Contact:    Angelita Ingles, RN Phone Number:[phone]  12/12/2022, 11:16 AM  Clinical Narrative:                 CM at bedside for home hospice referral.CM at bedside to offer choice. Patient and daughter [name]  at bedside. Patient and daughter both agreeable to [place]. Referral has been sent to [facility]. Patient and daughter have no other needs noted at this time.          Patient Goals and CMS Choice            Expected Discharge Plan and Services                                              Prior Living Arrangements/Services                       Activities of Daily Living Home Assistive Devices/Equipment: None ADL Screening (condition at time of admission) Patient's cognitive ability adequate to safely complete daily activities?: Yes Is the patient deaf or have difficulty hearing?: No Does the patient have difficulty seeing, even when wearing glasses/contacts?: No Does the patient have difficulty concentrating, remembering, or making decisions?: No Patient able to express need for assistance with ADLs?: Yes Does the patient have difficulty dressing or bathing?: No Independently performs ADLs?: Yes (appropriate for developmental age) Does the patient have difficulty walking or climbing stairs?: No Weakness of Legs: None Weakness of Arms/Hands: None  Permission Sought/Granted                  Emotional Assessment              Admission diagnosis:  Generalized abdominal pain [R10.84] Intractable abdominal pain [R10.9] Metastatic malignant neoplasm, unspecified site [facility]) [C79.9] Patient Active Problem List   Diagnosis Date Noted   Malnutrition of moderate degree 12/09/2022   Intractable abdominal pain 12/06/2022   Paroxysmal atrial  fibrillation ([place]) 12/06/2022   Metastatic adenocarcinoma of unknown origin ([place]) 12/06/2022   (HFpEF) heart failure with preserved ejection fraction ([place]) 12/06/2022   Chronic kidney disease, stage 3b ([place]) 12/06/2022   Intractable nausea and vomiting 10/27/2022   Incarcerated hernia 10/27/2022   Dehydration 10/27/2022   Hyperkalemia [id number]   Umbilical hernia without obstruction or gangrene 10/27/2022   Chronic combined systolic and diastolic heart failure ([place]) 08/26/2022   Sick sinus syndrome ([place]) 05/28/2022   UTI (urinary tract infection) 05/28/2022   Acute on chronic systolic CHF (congestive heart failure) ([place]) 05/28/2022   Acute kidney injury superimposed on chronic kidney disease ([place]) 05/28/2022   Persistent atrial fibrillation ([place])    Acute encephalopathy 12/25/2021   HLD (hyperlipidemia) 12/25/2021   Class 1 obesity 12/11/2021   Community acquired pneumonia    Atrial fibrillation with RVR ([place]) 12/09/2021   Respiratory distress 12/08/2021   Normocytic anemia 12/08/2021   Respiratory distress secondary to acute exacerbation of congestive heart failure ([place]) 12/08/2021   Hyponatremia 12/08/2021   Hypothyroidism 12/08/2021   Essential hypertension 12/08/2021   PCP:  Deon Pilling, NP Pharmacy:   [place], [place] - [address] [place] [place] [address] Phone:  [phone] Fax: [place] [address] [place] [address] Phone: [phone] Fax: [place] [address] Phone: [phone] Fax: [phone]     Social Determinants of Health (SDOH) Social History: SDOH Screenings   Food Insecurity: No Food Insecurity (12/09/2022)  Housing: Low Risk  (12/09/2022)  Transportation Needs: No Transportation Needs (12/09/2022)  Utilities: Not At Risk (12/09/2022)  Alcohol Screen:  Low Risk  (12/16/2021)  Depression (PHQ2-9): Medium Risk (11/17/2022)  Financial Resource Strain: Low Risk  (12/16/2021)  Tobacco Use: Low Risk  (12/06/2022)   SDOH Interventions:     Readmission Risk Interventions    12/12/2022   11:12 AM  Readmission Risk Prevention Plan  Transportation Screening Complete  PCP or Specialist Appt within 3-5 Days Complete  HRI or [place] Complete  Social Work Consult for [place] Planning/Counseling Complete  Palliative Care Screening Complete  Medication Review Press photographer) Referral to Pharmacy

## 2022-12-12 NOTE — Progress Notes (Signed)
IP PROGRESS NOTE  Subjective:   [name] continues to have abdominal pain, but she reports adequate pain relief with hydrocodone.  She had a bowel movement. She has discussed evaluation and management options with her daughter.  She has decided to enroll in home hospice care.  She would like to discontinue the planned PET scan.  Objective: Vital signs in last 24 hours: Blood pressure (!) 111/55, pulse 66, temperature (!) 97.5 F (36.4 C), temperature source Oral, resp. rate 18, height '5\' 6"'$  (1.676 m), weight 210 lb 5.1 oz (95.4 kg), SpO2 98 %.  Intake/Output from previous day: 03/07 0701 - 03/08 0700 In: 1080 [P.O.:780; IV Piggyback:300] Out: 800 [Urine:800]  Physical Exam:  Abdomen: Adlee distended, mild tenderness in the bilateral lower abdomen Extremities: No leg edema   Lab Results: Recent Labs    12/10/22 0555 12/12/22 0631  WBC 6.7 7.9  HGB 9.1* 9.4*  HCT 31.1* 29.5*  PLT 231 305    BMET Recent Labs    12/11/22 0600 12/12/22 0631  NA 130* 128*  K 4.3 4.8  CL 104 101  CO2 20* 20*  GLUCOSE 98 129*  BUN 16 20  CREATININE 0.96 0.98  CALCIUM 7.9* 8.3*    Lab Results  Component Value Date   CEA1 1.1 11/03/2022   [id number] 20 11/03/2022   Medications: I have reviewed the patient's current medications.  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 Umbilical hernia sac excision and omental biopsy 10/30/2022-moderately differentiated adenocarcinoma, AE1/AE3, CK7, PAX8, WT1, and ER positive Foundation 1-FGFR1 amplification, HRD-not detected, MSS, tumor mutation burden 1 CT abdomen/pelvis 10/07/2022-small to moderate fat-containing umbilical hernia with associated stranding of the contained fat, nonspecific scattered misty mesentery of the small bowel CT abdomen/pelvis 10/27/2022-moderate fat-containing periumbilical hernia with persistent increased density, small volume free fluid and mesenteric stranding CT chest  11/03/2022-no evidence of metastatic disease, mild focal groundglass opacity in the left upper lobe likely infectious or inflammatory, small bilateral pleural effusions and bibasilar atelectasis Mild elevation of the CA125 Abdominal pain-progressive History of congestive heart failure History of atrial fibrillation, right bundle branch block, and bradycardia-pacemaker placed 05/27/2022 Hysterectomy G2 P2 Hypertension Admission 12/06/2022 with increased abdominal pain  Ms. Cabler was diagnosed with abdominal carcinomatosis she underwent excision of an umbilical hernia sac and omental biopsy 10/30/2022.  No primary tumor site was identified on initial CT imaging.  The immunohistochemical profile of the omental biopsy is consistent with a gynecologic primary.  Molecular testing does not reveal a targetable mutation and she does not appear to be a candidate for immunotherapy or a PARP inhibitor.  Ms. Mccoig is now admitted with increased abdominal pain.  A CT does not reveal evidence of disease progression.  Pain could be related to progression of cancer not seen on the CT or another abdominal process.  The CA125 remains mildly elevated.  I discussed evaluation and treatment options with Ms. Elmi again today.  She has decided to forego further diagnostic evaluation and enter hospice care.  We discussed the role for tamoxifen since the tumor is ER positive.  We reviewed potential toxicities associated with tamoxifen including the chance of hot flashes and thromboembolism.  The goal of tamoxifen is to palliate symptoms and delay disease progression.  She agrees to a trial of tamoxifen.  She will continue hydrocodone and a bowel regimen.  We will consider adding a long-acting narcotic if pain is not adequately controlled with hydrocodone. We discussed CPR and ACLS.  She would like  to be placed on a no CODE BLUE status.   She plans to return home with hospice care.  Will schedule outpatient follow-up at  the cancer center in 1-2 weeks. Recommendations: Continue hydrocodone as needed for pain Bowel regimen Cancel PET scan Tamoxifen Discharge to home if pain is adequately controlled and she is ambulatory Antibiotics for UTI Referral to [facility] home hospice care Call oncology as needed, outpatient follow-up will be scheduled at the Cancer center    LOS: 5 days   Betsy Coder, MD   12/12/2022, 1:54 PM

## 2022-12-12 NOTE — Progress Notes (Signed)
[id number]) Hospital Liaison Note  Received request from Marta Lamas, RN Transitions of Care Manager, for hospice services at home after discharge. Spoke with Ms. Fairall and her [name] at bedside to initiate education related to hospice philosophy, services, and team approach to care. Patient/family verbalized understanding of information given. Per discussion, the plan is for discharge home by private vehicle in the next 1-2 days.  DME needs discussed. Patient has the following equipment in the home: rollator, 3 in1, shower bench, lift chair. No other DME needs identified at this time.    Please send signed and completed DNR home with patient/family.  Please provide prescriptions at discharge as needed to ensure ongoing symptom management.    [facility] information and contact numbers given to Name person who received.   Above information shared with Marta Lamas, RN Transitions of Care Manager.   Please call with any questions or concerns.   Thank you for the opportunity to participate in this patient's care.   Margaretmary Eddy, BSN, RN [facility] Liaison [phone]

## 2022-12-12 NOTE — Progress Notes (Signed)
Per Dr. Benay Spice: Made Hospice referral while inpatient. He will be attending.

## 2022-12-12 NOTE — Progress Notes (Signed)
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  On presentation, lipase was less than 10, UA was negative for UTI. CTA abdomen/pelvis negative for evidence for acute mesenteric ischemia. Mild approximately 20% stenosis at the origin of the left renal artery secondary to calcified plaque noted. Persistent diffuse mesenteric edema with small volume free fluid within the mesentery and along the peritoneal reflection is noted. No signs of abscess or pneumoperitoneum. Unchanged small hiatal hernia. Small hiatal hernia, unchanged.  She was given IV fluids and analgesics.  Oncology was consulted.  Assessment & Plan:   Intractable generalized abdominal pain with nausea -Imaging as above.  Questionable cause: Unsure if this is cancer-related pain.  Still having intermittent abdominal pain with nausea.  Right upper quadrant ultrasound on 10/28/2022 had shown no evidence of acute cholecystitis or biliary obstruction -Continue current pain management.  Tolerating some diet. -Continue Protonix IV twice a day.  Continue Carafate along with Maalox as needed -Still has intermittent abdominal pain but improving with current pain medication regimen.  Possible UTI: Possibly present on admission -UA suggestive of UTI.  Follow urine culture.  Continue Rocephin.  Metastatic adenocarcinoma involving umbilical hernia and omentum -Suspicion is for ovarian primary versus primary peritoneal carcinoma.  Oncology following.  Inpatient PET scan could not be done. -Patient is declining chemotherapy or  any further surgery. -Palliative care following -Patient interested in home hospice but has lots of questions including if tamoxifen can be used if she chooses home hospice.  TOC consult.  Chronic diastolic heart failure Mild aortic regurgitation Hypertension -Currently compensated.  Continue metoprolol succinate.  Aldactone/Entresto/Lasix on hold.  Will give 1 dose of IV Lasix today.  Tachybradycardia syndrome status post PPM placement PAF on Eliquis -Currently rate controlled.  Continue amiodarone, Eliquis and metoprolol succinate.  Outpatient follow-up with cardiology  AKI on chronic kidney disease stage IIIb -Creatinine much improved.  Peaked to 1.97.  Off IV fluids today -Repeat a.m. labs  Hyponatremia -Sodium 128 today.  Repeat a.m. labs.  Diuretics and IV fluids plan as above.  Acute urinary retention -Foley catheter placed on 12/09/2022.  Continue Flomax.  Will need outpatient urology follow-up  Anxiety -Continue gabapentin.  Ativan stopped because of somnolence.  Obesity -Outpatient follow-up  Goals of care -Palliative care following.  Discussion as above.  Constipation -Currently Senokot.  Had a very small bowel movement with suppository yesterday.  Hesitant to take full dose MiraLAX  DVT prophylaxis: Eliquis Code Status: DNR Family Communication: Daughter at bedside Disposition Plan: Status is: Inpatient Remains inpatient appropriate because: Of severity of illness.  Home with hospice in the next 1 to 2 days if abdominal pain improves  Consultants: Oncology.  palliative care  Procedures: None  Antimicrobials: None   Subjective: Patient seen and examined at bedside.  Continues to have intermittent abdominal pain.  Still feels constipated, had a very small bowel movement yesterday.  No fever, chest pain or worsening shortness of breath reported.   Objective: Vitals:   12/11/22 1431 12/11/22 2101 12/12/22 0438 12/12/22 0758  BP: (!) 127/53 (!) 128/54 (!)  126/58 (!) 137/55  Pulse: 60 62 64 61  Resp: '18 18 18 '$ 18  Temp: 98.1 F (36.7 C) 97.9 F (36.6 C) 97.6 F (36.4 C) (!) 97.5 F (36.4 C)  TempSrc: Oral Oral Oral Oral  SpO2: 95% 94% 97% 93%  Weight:      Height:        Intake/Output Summary (Last 24 hours) at 12/12/2022 1021 Last data filed at 12/12/2022 0442 Gross per 24 hour  Intake 840 ml  Output 800 ml  Net 40 ml    Filed Weights   12/09/22 1000 12/10/22 0609 12/11/22 0606  Weight: 93.7 kg 92.9 kg 95.4 kg    Examination:  General: No acute distress.  Still on room air.  Chronically ill and deconditioned looking. ENT/neck: No obvious JVD elevation or palpable neck masses noted  respiratory: Bilateral decreased breath sounds at bases with scattered crackles  CVS: Rate mostly controlled; S1 and S2 are heard Abdominal: Soft, obese, still slightly tender in the upper quadrant; distention present; no organomegaly, bowel sounds normally heard Extremities: No clubbing; mild lower extremity edema present  CNS: Alert and oriented. No focal neurologic deficit.  Moves extremities Lymph: No palpable lymphadenopathy present  skin: No obvious petechiae/rashes psych: Showing no signs of station.  Affect is flat.   Musculoskeletal: No obvious joint swelling/deformity Genitourinary: Foley catheter present   Data Reviewed: I have personally reviewed following labs and imaging studies  CBC: Recent Labs  Lab 12/06/22 1019 12/07/22 0720 12/09/22 0603 12/10/22 0555 12/12/22 0631  WBC 7.1 9.1 9.1 6.7 7.9  NEUTROABS  --   --  6.9 4.7 6.8  HGB 13.2 11.3* 10.1* 9.1* 9.4*  HCT 40.5 36.5 32.8* 31.1* 29.5*  MCV 87.9 90.6 92.4 95.1 88.6  PLT 220 253 237 231 [id number]    Basic Metabolic Panel: Recent Labs  Lab 12/09/22 0603 12/09/22 1401 12/10/22 0555 12/11/22 0600 12/12/22 0631  NA 125* 125* 131* 130* 128*  K 4.2 4.5 4.7 4.3 4.8  CL 96* 100 106 104 101  CO2 20* 21* 20* 20* 20*  GLUCOSE 122* 124* 97 98 129*  BUN 29* 26* '19 16 20   '$ CREATININE 1.97* 1.76* 1.28* 0.96 0.98  CALCIUM 7.9* 7.5* 7.7* 7.9* 8.3*  MG 1.7  --   --  1.8 1.8    GFR: Estimated Creatinine Clearance: 45.3 mL/min (by C-G formula based on SCr of 0.98 mg/dL). Liver Function Tests: Recent Labs  Lab 12/06/22 1019 12/09/22 0603 12/09/22 1401 12/10/22 0555  AST 13* '19 19 15  '$ ALT '7 14 14 13  '$ ALKPHOS 85 84 75 68  BILITOT 0.8 0.4 0.5 0.3  PROT 7.5 5.4* 5.3* 5.0*  ALBUMIN 4.1 2.6* 2.4* 2.3*    Recent Labs  Lab 12/06/22 1019  LIPASE <10*    No results for input(s): "AMMONIA" in the last 168 hours. Coagulation Profile: No results for input(s): "INR", "PROTIME" in the last 168 hours. Cardiac Enzymes: No results for input(s): "CKTOTAL", "CKMB", "CKMBINDEX", "TROPONINI" in the last 168 hours. BNP (last 3 results) No results for input(s): "PROBNP" in the last 8760 hours. HbA1C: No results for input(s): "HGBA1C" in the last 72 hours. CBG: No results for input(s): "GLUCAP" in the last 168 hours. Lipid Profile: No results for input(s): "CHOL", "HDL", "LDLCALC", "TRIG", "CHOLHDL", "LDLDIRECT" in the last 72 hours. Thyroid Function Tests: No results for input(s): "TSH", "T4TOTAL", "FREET4", "T3FREE", "THYROIDAB" in the last 72 hours. Anemia Panel: No results for input(s): "VITAMINB12", "FOLATE", "FERRITIN", "TIBC", "IRON", "RETICCTPCT" in the last 72 hours. Sepsis Labs: Recent Labs  Lab 12/06/22 1031  LATICACIDVEN 0.9  No results found for this or any previous visit (from the past 240 hour(s)).       Radiology Studies: No results found.      Scheduled Meds:  amiodarone  200 mg Oral Daily   apixaban  5 mg Oral BID   Chlorhexidine Gluconate Cloth  6 each Topical Daily   feeding supplement  237 mL Oral BID BM   gabapentin  100 mg Oral BID   levothyroxine  225 mcg Oral Q0600   metoprolol succinate  12.5 mg Oral Daily   pantoprazole  40 mg Oral BID   senna  2 tablet Oral BID   sucralfate  1 g Oral TID WC & HS   tamsulosin   0.4 mg Oral QPC supper   Continuous Infusions:  cefTRIAXone (ROCEPHIN)  IV 1 g (12/12/22 [id number])          Aline August, MD [facility] 12/12/2022, 10:21 AM

## 2022-12-12 NOTE — Progress Notes (Signed)
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 CTA abdomen/pelvis negative for evidence for acute mesenteric ischemia. Mild approximately 20% stenosis at the origin of the left renal artery secondary to calcified plaque noted. Persistent diffuse mesenteric edema with small volume free fluid within the mesentery and along the peritoneal reflection is noted.    PT Comments    The patient  agreeable to therapy and reports hopeful for DC tomorrow.  Patient required mod/max assistance to move to a sitting position with reports of abdominal pain.  Patient  ambulated 10' with Rw, gait steady. Daughter present. Continue PT for mobility.    Recommendations for follow up therapy are one component of a multi-disciplinary discharge planning process, led by the attending physician.  Recommendations may be updated based on patient status, additional functional criteria and insurance authorization.  Follow Up Recommendations  Home health PT-if allowed with Hospice services     Assistance Recommended at Discharge Frequent or constant Supervision/Assistance  Patient can return home with the following A little help with walking and/or transfers;A little help with bathing/dressing/bathroom;Assistance with cooking/housework;Assist for transportation;Help with stairs or ramp for entrance;Direct supervision/assist for medications management   Equipment Recommendations  None recommended by PT    Recommendations for Other Services       Precautions /  Restrictions Precautions Precautions: Fall Restrictions Weight Bearing Restrictions: No     Mobility  Bed Mobility   Bed Mobility: Supine to Sit     Supine to sit: Max assist     General bed mobility comments: much  aeefort to sit up, use of bed rail, reporting pain in the right lower abdomen, required  max assistance to get upright, then able to scoot forward.    Transfers Overall transfer level: Needs assistance Equipment used: Rolling walker (2 wheels) Transfers: Sit to/from Stand Sit to Stand: Min guard           General transfer comment: pt powers up to standing with BUE assisting,    Ambulation/Gait Ambulation/Gait assistance: Min guard Gait Distance (Feet): 10 Feet Assistive device: Rolling walker (2 wheels) Gait Pattern/deviations: Step-to pattern, Decreased stride length, Step-through pattern Gait velocity: decreased     General Gait Details: patient ambulated in room with RW, gait steady   Stairs             Wheelchair Mobility    Modified Rankin (Stroke Patients Only)       Balance Overall balance assessment: Mild deficits observed, not formally tested                                          Cognition Arousal/Alertness: Awake/alert Behavior During Therapy: WFL for tasks assessed/performed Overall Cognitive Status: Difficult to assess Area of Impairment: Memory                               General Comments: patient's daughter correcting patient frequently about medications, palns at Dc,  follows directions well.        Exercises  General Comments        Pertinent Vitals/Pain Pain Assessment Faces Pain Scale: Hurts even more Pain Location: right lower abd Pain Descriptors / Indicators: Grimacing Pain Intervention(s): Monitored during session, Premedicated before session    Home Living                          Prior Function            PT Goals (current goals can now be  found in the care plan section) Progress towards PT goals: Progressing toward goals    Frequency    Min 3X/week      PT Plan Current plan remains appropriate    Co-evaluation              AM-PAC PT "6 Clicks" Mobility   Outcome Measure  Help needed turning from your back to your side while in a flat bed without using bedrails?: A Lot Help needed moving from lying on your back to sitting on the side of a flat bed without using bedrails?: A Lot Help needed moving to and from a bed to a chair (including a wheelchair)?: A Little Help needed standing up from a chair using your arms (e.g., wheelchair or bedside chair)?: A Little Help needed to walk in hospital room?: A Little Help needed climbing 3-5 steps with a railing? : A Little 6 Click Score: 16    End of Session Equipment Utilized During Treatment: Gait belt Activity Tolerance: Patient tolerated treatment well Patient left: in chair;with call bell/phone within reach;with family/visitor present;with chair alarm set Nurse Communication: Mobility status PT Visit Diagnosis: Other abnormalities of gait and mobility (R26.89);Muscle weakness (generalized) (M62.81)     Time: [id number] PT Time Calculation (min) (ACUTE ONLY): 20 min  Charges:  $Gait Training: 8-22 mins                     [facility] Office [phone] Weekend [id number]    Claretha Cooper 12/12/2022, 1:22 PM

## 2022-12-12 NOTE — Progress Notes (Signed)
Daily Progress Note   Patient Name: [name]       Date: 12/12/2022 DOB: November 30, 1930  Age: 89 y.o. MRN#: [id number] Attending Physician: Aline August, MD Primary Care Physician: Deon Pilling, NP Admit Date: 12/06/2022  Reason for Consultation/Follow-up: Establishing goals of care  Patient Profile/HPI:  89 y.o. 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   Subjective: Chart reviewed including labs, progress notes, imaging from this and previous encounters.   Patient is resting in bed, she had a very slight BM/smear, she is passing gas, she sat on bedside commode and is awaiting suppository/bowel regimen to have a full bowel movement.   Daughter at bedside.  [facility] has seen patient, plan is home with hospice.    Physical Exam Vitals and nursing note reviewed.  Constitutional:      General: She is not in acute distress. Cardiovascular:     Rate and Rhythm: Normal rate.  Pulmonary:     Effort: Pulmonary effort is normal.  Neurological:     Mental Status: She is alert and oriented to person, place, and time.             Vital Signs: BP (!) 111/55 (BP Location: Left Arm)   Pulse 66   Temp (!) 97.5 F (36.4 C) (Oral)   Resp 18   Ht '5\' 6"'$  (1.676 m)   Wt 95.4 kg   SpO2 98%   BMI 33.95 kg/m  SpO2: SpO2: 98 % O2 Device: O2 Device: Room Air O2 Flow Rate: O2 Flow Rate (L/min): 4 L/min  Intake/output summary:  Intake/Output Summary (Last 24 hours) at 12/12/2022 1509 Last data filed at 12/12/2022 1446 Gross per 24 hour  Intake 860 ml  Output 1350 ml  Net -490  ml    LBM: Last BM Date : 12/11/22 Baseline Weight: Weight: 93.7 kg Most recent weight: Weight: 95.4 kg       Palliative Assessment/Data: 60%      Patient Active Problem List   Diagnosis Date Noted   Malnutrition of moderate degree 12/09/2022   Intractable abdominal pain 12/06/2022   Paroxysmal atrial fibrillation ([place]) 12/06/2022   Metastatic adenocarcinoma  of unknown origin ([place]) 12/06/2022   (HFpEF) heart failure with preserved ejection fraction ([place]) 12/06/2022   Chronic kidney disease, stage 3b ([place]) 12/06/2022   Intractable nausea and vomiting 10/27/2022   Incarcerated hernia 10/27/2022   Dehydration 10/27/2022   Hyperkalemia [id number]   Umbilical hernia without obstruction or gangrene 10/27/2022   Chronic combined systolic and diastolic heart failure ([place]) 08/26/2022   Sick sinus syndrome ([place]) 05/28/2022   UTI (urinary tract infection) 05/28/2022   Acute on chronic systolic CHF (congestive heart failure) ([place]) 05/28/2022   Acute kidney injury superimposed on chronic kidney disease ([place]) 05/28/2022   Persistent atrial fibrillation ([place])    Acute encephalopathy 12/25/2021   HLD (hyperlipidemia) 12/25/2021   Class 1 obesity 12/11/2021   Community acquired pneumonia    Atrial fibrillation with RVR ([place]) 12/09/2021   Respiratory distress 12/08/2021   Normocytic anemia 12/08/2021   Respiratory distress secondary to acute exacerbation of congestive heart failure ([place]) 12/08/2021   Hyponatremia 12/08/2021   Hypothyroidism 12/08/2021   Essential hypertension 12/08/2021    Palliative Care Assessment & Plan    Assessment/Recommendations/Plan  Continue current pain regimen Bowel regimen noted: is on  lactulose,miralax, dulcolax suppository   Home with hospice.   Code Status: DNR  Prognosis: Less than 6 months  Discharge Planning: Home with hospice  Care plan was discussed with patient and her daughter  Thank you for allowing the Palliative Medicine Team  to assist in the care of this patient.  Mod MDM  Greater than 50%  of this time was spent counseling and coordinating care related to the above assessment and plan.  Loistine Chance MD Palliative Medicine   Please contact Palliative Medicine Team phone at [phone] for questions and concerns.

## 2022-12-13 DIAGNOSIS — C799 Secondary malignant neoplasm of unspecified site: Secondary | ICD-10-CM | POA: Diagnosis not present

## 2022-12-13 DIAGNOSIS — R109 Unspecified abdominal pain: Secondary | ICD-10-CM | POA: Diagnosis not present

## 2022-12-13 LAB — BASIC METABOLIC PANEL
Anion gap: 6 (ref 5–15)
BUN: 20 mg/dL (ref 8–23)
CO2: 20 mmol/L — ABNORMAL LOW (ref 22–32)
Calcium: 7.9 mg/dL — ABNORMAL LOW (ref 8.9–10.3)
Chloride: 97 mmol/L — ABNORMAL LOW (ref 98–111)
Creatinine, Ser: 0.95 mg/dL (ref 0.44–1.00)
GFR, Estimated: 57 mL/min — ABNORMAL LOW (ref >60)
Glucose, Bld: 88 mg/dL (ref 70–99)
Potassium: 3.9 mmol/L (ref 3.5–5.1)
Sodium: 123 mmol/L — ABNORMAL LOW (ref 135–145)

## 2022-12-13 LAB — MAGNESIUM: Magnesium: 1.6 mg/dL — ABNORMAL LOW (ref 1.7–2.4)

## 2022-12-13 MED ORDER — LORAZEPAM 0.5 MG PO TABS
0.5000 mg | ORAL_TABLET | Freq: Two times a day (BID) | ORAL | Status: DC | PRN
  Administered 2022-12-13 – 2022-12-16 (×3): 0.5 mg via ORAL
  Filled 2022-12-13 (×3): qty 1

## 2022-12-13 MED ORDER — SODIUM CHLORIDE 0.9 % IV SOLN
1.0000 g | Freq: Two times a day (BID) | INTRAVENOUS | Status: DC
  Administered 2022-12-13 – 2022-12-16 (×7): 1 g via INTRAVENOUS
  Filled 2022-12-13 (×7): qty 10

## 2022-12-13 MED ORDER — FUROSEMIDE 10 MG/ML IJ SOLN
40.0000 mg | Freq: Once | INTRAMUSCULAR | Status: AC
  Administered 2022-12-13: 40 mg via INTRAVENOUS
  Filled 2022-12-13: qty 4

## 2022-12-13 MED ORDER — SENNOSIDES-DOCUSATE SODIUM 8.6-50 MG PO TABS
2.0000 | ORAL_TABLET | Freq: Every day | ORAL | Status: DC
  Administered 2022-12-14 – 2022-12-15 (×2): 1 via ORAL
  Administered 2022-12-16: 2 via ORAL
  Filled 2022-12-13 (×3): qty 2

## 2022-12-13 NOTE — Progress Notes (Signed)
Pharmacy Antibiotic Note  [name] is a 89 y.o. female admitted on 12/06/2022 with UTI.  Pharmacy has been consulted for Cefepime dosing.  ID: UTI - WBC WNL, Afebrile  3/7 CTX>>3/9 3/9 Cefepime>>  3/6 ucx: 60,000 colonies of PSA  Plan: D/c Rocephin Cefepime 1g IV q12 hrs. Pharmacy will sign off. Please reconsult for further dosing assitance.   Height: '5\' 6"'$  (167.6 cm) Weight: 95.4 kg (210 lb 5.1 oz) IBW/kg (Calculated) : 59.3  Temp (24hrs), Avg:97.6 F (36.4 C), Min:97.5 F (36.4 C), Max:97.7 F (36.5 C)  Recent Labs  Lab 12/07/22 0720 12/09/22 0603 12/09/22 1401 12/10/22 0555 12/11/22 0600 12/12/22 0631 12/13/22 0714  WBC 9.1 9.1  --  6.7  --  7.9  --   CREATININE 1.45* 1.97* 1.76* 1.28* 0.96 0.98 0.95    Estimated Creatinine Clearance: 46.7 mL/min (by C-G formula based on SCr of 0.95 mg/dL).    Allergies  Allergen Reactions   Atropine Other (See Comments)    Made her entire body jump after they gave it to her.   Miralax [Polyethylene Glycol] Other (See Comments)    17g dose caused stomach troubles for 3 days    Prozac [Fluoxetine] Other (See Comments)    Hyponatremia    Serotonin Reuptake Inhibitors (Ssris) Other (See Comments)    Hyponatremia    Keflex [Cephalexin] Other (See Comments)    Unknown reaction Tolerated Cefdinir 05/2022    Amiah Frohlich S. Alford Highland, PharmD, BCPS Clinical Staff Pharmacist [email or web address]  Wayland Salinas 12/13/2022 10:49 AM

## 2022-12-13 NOTE — Progress Notes (Signed)
[facility] [address] [facility]) Hospice hospital liaison note  Patient is referred for hospice services at home.   Liaison following for discharge disposition.  Please don't hesitate to reach out for any hospice related questions or concerns.   Thank you for the opportunity to participate in this patient's care Jhonnie Garner, BSN, RN [phone]

## 2022-12-13 NOTE — Progress Notes (Signed)
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  On presentation, lipase was less than 10, UA was negative for UTI. CTA abdomen/pelvis negative for evidence for acute mesenteric ischemia. Mild approximately 20% stenosis at the origin of the left renal artery secondary to calcified plaque noted. Persistent diffuse mesenteric edema with small volume free fluid within the mesentery and along the peritoneal reflection is noted. No signs of abscess or pneumoperitoneum. Unchanged small hiatal hernia. Small hiatal hernia, unchanged.  She was given IV fluids and analgesics.  Oncology was consulted.  Assessment & Plan:   Intractable generalized abdominal pain with nausea -Imaging as above.  Questionable cause: Unsure if this is cancer-related pain.  Still having intermittent abdominal pain with nausea.  Right upper quadrant ultrasound on 10/28/2022 had shown no evidence of acute cholecystitis or biliary obstruction -Continue current pain management.  Tolerating some diet. -Continue Protonix IV twice a day.  Continue Carafate along with Maalox as needed -Still has intermittent abdominal pain which is worse this morning and does not feel ready to go home today.    Possible UTI: Possibly present on admission -UA suggestive of UTI.  Urine growing Pseudomonas.  Switch Rocephin to cefepime.  Possible switch to oral ciprofloxacin on discharge.    Metastatic adenocarcinoma involving umbilical hernia and omentum -Suspicion is for ovarian primary versus primary peritoneal carcinoma.  Oncology  following.  Inpatient PET scan could not be done. -Patient is declining chemotherapy or any further surgery. -Palliative care following -Patient interested in home hospice: TOC/hospice team aware.  Possible discharge in 1 to 2 days if patient feels better.  Chronic diastolic heart failure Mild aortic regurgitation Hypertension -Currently compensated.  Continue metoprolol succinate.  Aldactone/Entresto/Lasix on hold.  Will give dose of IV Lasix today.  Tachybradycardia syndrome status post PPM placement PAF on Eliquis -Currently rate controlled.  Continue amiodarone, Eliquis and metoprolol succinate.  Outpatient follow-up with cardiology  AKI on chronic kidney disease stage IIIb -Creatinine much improved.  Peaked to 1.97.  Off IV fluids today -Repeat a.m. labs  Hyponatremia -Sodium has worsened to 123 today.  Repeat a.m. labs.  Diuretics plan as above.    Acute urinary retention -Foley catheter placed on 12/09/2022.  Continue Flomax.  Will need outpatient urology follow-up  Anxiety -Continue gabapentin.  Resume Ativan as needed twice a day for now.  Anxious this morning.  Obesity -Outpatient follow-up  Goals of care -Palliative care following.  Discussion as above.  Constipation -She was put on lactulose yesterday and she has had multiple bowel movements since then.  Will DC lactulose.  Switch Senokot to once a day.  DVT prophylaxis: Eliquis Code Status: DNR Family Communication: Daughter at bedside Disposition Plan: Status is: Inpatient Remains inpatient appropriate because: Of severity of illness.  Home with hospice in the next 1 to 2 days if abdominal pain improves  Consultants: Oncology.  palliative care  Procedures: None  Antimicrobials: None   Subjective: Patient seen and examined at bedside.  Does not feel well this morning with increasing abdominal pain.  Has had multiple loose bowel movements since yesterday evening.  Feels anxious and jittery.  No chest pain,  fever or  vomiting reported.   Objective: Vitals:   12/12/22 0758 12/12/22 1309 12/12/22 2032 12/13/22 0509  BP: (!) 137/55 (!) 111/55 127/60 (!) 138/50  Pulse: 61 66 64 66  Resp: '18 18 18 18  '$ Temp: (!) 97.5 F (36.4 C) (!) 97.5 F (36.4 C) 97.6 F (36.4 C) 97.7 F (36.5 C)  TempSrc: Oral Oral Oral Oral  SpO2: 93% 98% 98% 95%  Weight:      Height:        Intake/Output Summary (Last 24 hours) at 12/13/2022 1129 Last data filed at 12/13/2022 1100 Gross per 24 hour  Intake 600 ml  Output 1400 ml  Net -800 ml    Filed Weights   12/09/22 1000 12/10/22 0609 12/11/22 0606  Weight: 93.7 kg 92.9 kg 95.4 kg    Examination:  General: On room air.  No distress.  Chronically ill and deconditioned looking. ENT/neck: No palpable thyromegaly or JVD elevation noted respiratory: Decreased breath sounds at bases bilaterally with some crackles CVS: S1-S2 heard; rate controlled currently Abdominal: Soft, obese, tender in the lower quadrant; still distended; no organomegaly, bowel sounds heard  extremities: Trace lower extremity edema present; no clubbing  CNS: Awake and alert.  No focal neurologic deficit.  Moving extremities. lymph: No obvious lymphadenopathy present skin: No obvious ecchymosis/lesions  psych: Looks anxious and jittery intermittently.  Not agitated.   Musculoskeletal: No obvious joint swelling/deformity Genitourinary: Still has a Foley catheter Data Reviewed: I have personally reviewed following labs and imaging studies  CBC: Recent Labs  Lab 12/07/22 0720 12/09/22 0603 12/10/22 0555 12/12/22 0631  WBC 9.1 9.1 6.7 7.9  NEUTROABS  --  6.9 4.7 6.8  HGB 11.3* 10.1* 9.1* 9.4*  HCT 36.5 32.8* 31.1* 29.5*  MCV 90.6 92.4 95.1 88.6  PLT 253 237 231 [id number]    Basic Metabolic Panel: Recent Labs  Lab 12/09/22 0603 12/09/22 1401 12/10/22 0555 12/11/22 0600 12/12/22 0631 12/13/22 0714  NA 125* 125* 131* 130* 128* 123*  K 4.2 4.5 4.7 4.3 4.8 3.9  CL 96* 100 106 104 101  97*  CO2 20* 21* 20* 20* 20* 20*  GLUCOSE 122* 124* 97 98 129* 88  BUN 29* 26* '19 16 20 20  '$ CREATININE 1.97* 1.76* 1.28* 0.96 0.98 0.95  CALCIUM 7.9* 7.5* 7.7* 7.9* 8.3* 7.9*  MG 1.7  --   --  1.8 1.8 1.6*    GFR: Estimated Creatinine Clearance: 46.7 mL/min (by C-G formula based on SCr of 0.95 mg/dL). Liver Function Tests: Recent Labs  Lab 12/09/22 0603 12/09/22 1401 12/10/22 0555  AST '19 19 15  '$ ALT '14 14 13  '$ ALKPHOS 84 75 68  BILITOT 0.4 0.5 0.3  PROT 5.4* 5.3* 5.0*  ALBUMIN 2.6* 2.4* 2.3*    No results for input(s): "LIPASE", "AMYLASE" in the last 168 hours.  No results for input(s): "AMMONIA" in the last 168 hours. Coagulation Profile: No results for input(s): "INR", "PROTIME" in the last 168 hours. Cardiac Enzymes: No results for input(s): "CKTOTAL", "CKMB", "CKMBINDEX", "TROPONINI" in the last 168 hours. BNP (last 3 results) No results for input(s): "PROBNP" in the last 8760 hours. HbA1C: No results for input(s): "HGBA1C" in the last 72 hours. CBG: No results for input(s): "GLUCAP" in the last 168 hours. Lipid Profile: No results for input(s): "CHOL", "HDL", "LDLCALC", "TRIG", "CHOLHDL", "LDLDIRECT" in the last 72 hours. Thyroid Function Tests: No results for input(s): "TSH", "T4TOTAL", "FREET4", "T3FREE", "THYROIDAB" in the last 72 hours. Anemia Panel: No results for input(s): "VITAMINB12", "FOLATE", "  FERRITIN", "TIBC", "IRON", "RETICCTPCT" in the last 72 hours. Sepsis Labs: No results for input(s): "PROCALCITON", "LATICACIDVEN" in the last 168 hours.   Recent Results (from the past 240 hour(s))  Urine Culture (for pregnant, neutropenic or urologic patients or patients with an indwelling urinary catheter)     Status: Abnormal (Preliminary result)   Collection Time: 12/10/22  1:29 PM   Specimen: Urine, Catheterized  Result Value Ref Range Status   Specimen Description   Final    URINE, CATHETERIZED Performed at [facility]  [address] [place], [place] [address]    Special Requests   Final    NONE Performed at [facility], [place] [address], [place], [place] [address]    Culture (A)  Final    >=100,000 COLONIES/mL PSEUDOMONAS AERUGINOSA 60,000 COLONIES/mL GRAM NEGATIVE RODS IDENTIFICATION AND SUSCEPTIBILITIES TO FOLLOW Performed at [facility], [place] [address], [place], [place] [address]    Report Status PENDING  Incomplete   Organism ID, Bacteria PSEUDOMONAS AERUGINOSA (A)  Final      Susceptibility   Pseudomonas aeruginosa - MIC*    CEFTAZIDIME 2 SENSITIVE Sensitive     CIPROFLOXACIN 0.5 SENSITIVE Sensitive     GENTAMICIN <=1 SENSITIVE Sensitive     IMIPENEM 2 SENSITIVE Sensitive     PIP/TAZO <=4 SENSITIVE Sensitive     CEFEPIME 2 SENSITIVE Sensitive     * >=100,000 COLONIES/mL PSEUDOMONAS AERUGINOSA         Radiology Studies: No results found.      Scheduled Meds:  amiodarone  200 mg Oral Daily   apixaban  5 mg Oral BID   Chlorhexidine Gluconate Cloth  6 each Topical Daily   feeding supplement  237 mL Oral BID BM   gabapentin  100 mg Oral BID   levothyroxine  225 mcg Oral Q0600   metoprolol succinate  12.5 mg Oral Daily   pantoprazole  40 mg Oral BID   [START ON 12/14/2022] senna-docusate  2 tablet Oral Daily   sucralfate  1 g Oral TID WC & HS   tamoxifen  20 mg Oral Daily   tamsulosin  0.4 mg Oral QPC supper   Continuous Infusions:  ceFEPime (MAXIPIME) IV            Aline August, MD [facility] 12/13/2022, 11:29 AM

## 2022-12-14 DIAGNOSIS — R109 Unspecified abdominal pain: Secondary | ICD-10-CM | POA: Diagnosis not present

## 2022-12-14 LAB — URINE CULTURE: Culture: >=100000 — AB

## 2022-12-14 LAB — BASIC METABOLIC PANEL
Anion gap: 8 (ref 5–15)
BUN: 22 mg/dL (ref 8–23)
CO2: 21 mmol/L — ABNORMAL LOW (ref 22–32)
Calcium: 8.2 mg/dL — ABNORMAL LOW (ref 8.9–10.3)
Chloride: 99 mmol/L (ref 98–111)
Creatinine, Ser: 1.1 mg/dL — ABNORMAL HIGH (ref 0.44–1.00)
GFR, Estimated: 48 mL/min — ABNORMAL LOW (ref >60)
Glucose, Bld: 78 mg/dL (ref 70–99)
Potassium: 4.2 mmol/L (ref 3.5–5.1)
Sodium: 128 mmol/L — ABNORMAL LOW (ref 135–145)

## 2022-12-14 LAB — MAGNESIUM: Magnesium: 1.6 mg/dL — ABNORMAL LOW (ref 1.7–2.4)

## 2022-12-14 MED ORDER — SACUBITRIL-VALSARTAN 24-26 MG PO TABS
1.0000 | ORAL_TABLET | Freq: Two times a day (BID) | ORAL | Status: DC
  Administered 2022-12-14 – 2022-12-16 (×5): 1 via ORAL
  Filled 2022-12-14 (×5): qty 1

## 2022-12-14 MED ORDER — MAGNESIUM SULFATE 2 GM/50ML IV SOLN
2.0000 g | Freq: Once | INTRAVENOUS | Status: AC
  Administered 2022-12-14: 2 g via INTRAVENOUS
  Filled 2022-12-14: qty 50

## 2022-12-14 MED ORDER — GABAPENTIN 100 MG PO CAPS
100.0000 mg | ORAL_CAPSULE | Freq: Three times a day (TID) | ORAL | Status: DC
  Administered 2022-12-14 – 2022-12-15 (×3): 100 mg via ORAL
  Filled 2022-12-14 (×3): qty 1

## 2022-12-14 MED ORDER — FENTANYL 12 MCG/HR TD PT72
1.0000 | MEDICATED_PATCH | TRANSDERMAL | Status: DC
  Administered 2022-12-14: 1 via TRANSDERMAL
  Filled 2022-12-14: qty 1

## 2022-12-14 MED ORDER — FUROSEMIDE 20 MG PO TABS
20.0000 mg | ORAL_TABLET | Freq: Every day | ORAL | Status: DC
  Administered 2022-12-15 – 2022-12-16 (×2): 20 mg via ORAL
  Filled 2022-12-14 (×2): qty 1

## 2022-12-14 MED ORDER — HYDROCODONE-ACETAMINOPHEN 5-325 MG PO TABS
1.0000 | ORAL_TABLET | Freq: Four times a day (QID) | ORAL | Status: DC
  Administered 2022-12-14 – 2022-12-16 (×8): 1 via ORAL
  Filled 2022-12-14 (×8): qty 1

## 2022-12-14 NOTE — Progress Notes (Signed)
[facility]) Hospice hospital liaison note   Patient is referred for hospice services at home.    Liaison following for discharge disposition.   Please don't hesitate to reach out for any hospice related questions or concerns.    Thank you for the opportunity to participate in this patient's care Jhonnie Garner, BSN, RN [phone]

## 2022-12-14 NOTE — Progress Notes (Signed)
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  On presentation, lipase was less than 10, UA was negative for UTI. CTA abdomen/pelvis negative for evidence for acute mesenteric ischemia. Mild approximately 20% stenosis at the origin of the left renal artery secondary to calcified plaque noted. Persistent diffuse mesenteric edema with small volume free fluid within the mesentery and along the peritoneal reflection is noted. No signs of abscess or pneumoperitoneum. Unchanged small hiatal hernia. Small hiatal hernia, unchanged.  She was given IV fluids and analgesics.  Oncology was consulted.  Assessment & Plan:   Intractable generalized abdominal pain with nausea -Imaging as above.  Questionable cause: Unsure if this is cancer-related pain.  Still having intermittent abdominal pain with nausea.  Right upper quadrant ultrasound on 10/28/2022 had shown no evidence of acute cholecystitis or biliary obstruction -Continue current pain management.  Tolerating some diet. -Continue Protonix IV twice a day.  Continue Carafate along with Maalox as needed -Still has intermittent abdominal pain and does not feel ready to go home today too  Possible UTI: Possibly present on admission -UA suggestive of UTI.  Urine growing Pseudomonas.  Continue cefepime for now.  Possible switch to oral ciprofloxacin on discharge.    Metastatic adenocarcinoma involving umbilical hernia and omentum -Suspicion is for ovarian primary versus primary peritoneal carcinoma.  Oncology following.  Inpatient PET scan  could not be done. -Patient is declining chemotherapy or any further surgery. -Palliative care following -Patient interested in home hospice: TOC/hospice team aware.  Possible discharge in 1 to 2 days if patient feels better. -Still in significant pain.  After discussion with patient and daughter at bedside today, will switch Norco to scheduled every 6 hours.  Add low-dose fentanyl patch.  Increase gabapentin.  Chronic diastolic heart failure Mild aortic regurgitation Hypertension -Currently compensated.  Continue metoprolol succinate.  Aldactone/Entresto/Lasix on hold.  Received 2 doses of IV Lasix so far during the hospitalization.  Resume Entresto today and Lasix from tomorrow onwards.  Tachybradycardia syndrome status post PPM placement PAF on Eliquis -Currently rate controlled.  Continue amiodarone, Eliquis and metoprolol succinate.  Outpatient follow-up with cardiology  AKI on chronic kidney disease stage IIIb -Creatinine much improved.  Peaked to 1.97.  Off IV fluids   Hyponatremia -Sodium improved to 128 today.  Diuretic plan as above.  Repeat a.m. labs.  Acute urinary retention -Foley catheter placed on 12/09/2022.  Continue Flomax.  Will need outpatient urology follow-up  Anxiety -Increase dose of gabapentin.  Continue Ativan as needed twice a day for now.    Obesity -Outpatient follow-up  Goals of care -Palliative care following.  Discussion as above.  Constipation -She was put on lactulose and she has had multiple bowel movements yesterday morning.  Lactulose has been discontinued.  Continue Senokot once a day.  DVT prophylaxis: Eliquis Code Status: DNR Family Communication: Daughter at bedside Disposition Plan: Status is: Inpatient Remains inpatient appropriate because: Of severity of illness.  Home with hospice in the next 1 to 2 days if abdominal pain improves  Consultants: Oncology.  palliative care  Procedures: None  Antimicrobials:  None   Subjective: Patient seen and examined at bedside.  Does  not feel ready to go home today.  Still having some intermittent severe abdominal pain.  No fever or vomiting reported. Objective: Vitals:   12/13/22 1328 12/13/22 2208 12/14/22 0422 12/14/22 0500  BP: (!) 135/59 (!) 124/55 (!) 116/54   Pulse: 62 61 60   Resp: '18 17 17   '$ Temp: (!) 97.5 F (36.4 C) 97.7 F (36.5 C) 97.8 F (36.6 C)   TempSrc: Oral  Oral   SpO2: 99% 98% 93%   Weight:    96.1 kg  Height:        Intake/Output Summary (Last 24 hours) at 12/14/2022 1106 Last data filed at 12/14/2022 0603 Gross per 24 hour  Intake 220 ml  Output 3150 ml  Net -2930 ml    Filed Weights   12/10/22 0609 12/11/22 0606 12/14/22 0500  Weight: 92.9 kg 95.4 kg 96.1 kg    Examination:  General: No acute distress still on room air.  Chronically ill and deconditioned looking. ENT/neck: No obvious elevation of JVD or palpable neck masses noted  respiratory: Bilateral decreased breath sounds at bases with some scattered crackles CVS: Rate mostly controlled; S1 and S2 are heard Abdominal: Soft, obese, lower quadrant tenderness present; distention present; no organomegaly, normal bowel sounds are heard extremities: No cyanosis; mild lower extremity edema present  CNS: Alert and awake.  No focal neurologic deficit.  Able to move extremities  lymph: No obvious lymphadenopathy present skin: No obvious petechiae/rashes  psych: Currently not agitated.  Anxious looking. Musculoskeletal: No obvious joint swelling/deformity Genitourinary: Foley catheter present   data Reviewed: I have personally reviewed following labs and imaging studies  CBC: Recent Labs  Lab 12/09/22 0603 12/10/22 0555 12/12/22 0631  WBC 9.1 6.7 7.9  NEUTROABS 6.9 4.7 6.8  HGB 10.1* 9.1* 9.4*  HCT 32.8* 31.1* 29.5*  MCV 92.4 95.1 88.6  PLT 237 231 [id number]    Basic Metabolic Panel: Recent Labs  Lab 12/09/22 0603 12/09/22 1401 12/10/22 0555  12/11/22 0600 12/12/22 0631 12/13/22 0714 12/14/22 0643  NA 125*   < > 131* 130* 128* 123* 128*  K 4.2   < > 4.7 4.3 4.8 3.9 4.2  CL 96*   < > 106 104 101 97* 99  CO2 20*   < > 20* 20* 20* 20* 21*  GLUCOSE 122*   < > 97 98 129* 88 78  BUN 29*   < > '19 16 20 20 22  '$ CREATININE 1.97*   < > 1.28* 0.96 0.98 0.95 1.10*  CALCIUM 7.9*   < > 7.7* 7.9* 8.3* 7.9* 8.2*  MG 1.7  --   --  1.8 1.8 1.6* 1.6*   < > = values in this interval not displayed.    GFR: Estimated Creatinine Clearance: 40.5 mL/min (A) (by C-G formula based on SCr of 1.1 mg/dL (H)). Liver Function Tests: Recent Labs  Lab 12/09/22 0603 12/09/22 1401 12/10/22 0555  AST '19 19 15  '$ ALT '14 14 13  '$ ALKPHOS 84 75 68  BILITOT 0.4 0.5 0.3  PROT 5.4* 5.3* 5.0*  ALBUMIN 2.6* 2.4* 2.3*    No results for input(s): "LIPASE", "AMYLASE" in the last 168 hours.  No results for input(s): "AMMONIA" in the last 168 hours. Coagulation Profile: No results for input(s): "INR", "PROTIME" in the last 168 hours. Cardiac Enzymes: No results for input(s): "CKTOTAL", "CKMB", "CKMBINDEX", "TROPONINI" in the last 168 hours. BNP (last 3 results) No results for input(s): "PROBNP" in the last 8760 hours. HbA1C: No results for  input(s): "HGBA1C" in the last 72 hours. CBG: No results for input(s): "GLUCAP" in the last 168 hours. Lipid Profile: No results for input(s): "CHOL", "HDL", "LDLCALC", "TRIG", "CHOLHDL", "LDLDIRECT" in the last 72 hours. Thyroid Function Tests: No results for input(s): "TSH", "T4TOTAL", "FREET4", "T3FREE", "THYROIDAB" in the last 72 hours. Anemia Panel: No results for input(s): "VITAMINB12", "FOLATE", "FERRITIN", "TIBC", "IRON", "RETICCTPCT" in the last 72 hours. Sepsis Labs: No results for input(s): "PROCALCITON", "LATICACIDVEN" in the last 168 hours.   Recent Results (from the past 240 hour(s))  Urine Culture (for pregnant, neutropenic or urologic patients or patients with an indwelling urinary catheter)      Status: Abnormal   Collection Time: 12/10/22  1:29 PM   Specimen: Urine, Catheterized  Result Value Ref Range Status   Specimen Description   Final    URINE, CATHETERIZED Performed at [facility] [address], [place], [place] [address]    Special Requests   Final    NONE Performed at [facility], [place] [address], [place], [place] [address]    Culture (A)  Final    >=100,000 COLONIES/mL PSEUDOMONAS AERUGINOSA 60,000 COLONIES/mL KLEBSIELLA PNEUMONIAE    Report Status 12/14/2022 FINAL  Final   Organism ID, Bacteria PSEUDOMONAS AERUGINOSA (A)  Final   Organism ID, Bacteria KLEBSIELLA PNEUMONIAE (A)  Final      Susceptibility   Klebsiella pneumoniae - MIC*    AMPICILLIN RESISTANT Resistant     CEFAZOLIN <=4 SENSITIVE Sensitive     CEFEPIME <=0.12 SENSITIVE Sensitive     CEFTRIAXONE <=0.25 SENSITIVE Sensitive     CIPROFLOXACIN <=0.25 SENSITIVE Sensitive     GENTAMICIN <=1 SENSITIVE Sensitive     IMIPENEM <=0.25 SENSITIVE Sensitive     NITROFURANTOIN 64 INTERMEDIATE Intermediate     TRIMETH/SULFA <=20 SENSITIVE Sensitive     AMPICILLIN/SULBACTAM <=2 SENSITIVE Sensitive     PIP/TAZO <=4 SENSITIVE Sensitive     * 60,000 COLONIES/mL KLEBSIELLA PNEUMONIAE   Pseudomonas aeruginosa - MIC*    CEFTAZIDIME 2 SENSITIVE Sensitive     CIPROFLOXACIN 0.5 SENSITIVE Sensitive     GENTAMICIN <=1 SENSITIVE Sensitive     IMIPENEM 2 SENSITIVE Sensitive     PIP/TAZO <=4 SENSITIVE Sensitive     CEFEPIME 2 SENSITIVE Sensitive     * >=100,000 COLONIES/mL PSEUDOMONAS AERUGINOSA         Radiology Studies: No results found.      Scheduled Meds:  amiodarone  200 mg Oral Daily   apixaban  5 mg Oral BID   Chlorhexidine Gluconate Cloth  6 each Topical Daily   feeding supplement  237 mL Oral BID BM   gabapentin  100 mg Oral BID   levothyroxine  225 mcg Oral Q0600   metoprolol succinate  12.5 mg Oral Daily   pantoprazole  40 mg Oral BID    senna-docusate  2 tablet Oral Daily   sucralfate  1 g Oral TID WC & HS   tamoxifen  20 mg Oral Daily   tamsulosin  0.4 mg Oral QPC supper   Continuous Infusions:  ceFEPime (MAXIPIME) IV 1 g (12/13/22 2301)          Aline August, MD [facility] 12/14/2022, 11:06 AM

## 2022-12-14 NOTE — Progress Notes (Signed)
Daily Progress Note   Patient Name: [name]       Date: 12/14/2022 DOB: [birth year]  Age: 89 y.o. MRN#: [id number] Attending Physician: Aline August, MD Primary Care Physician: Deon Pilling, NP Admit Date: 12/06/2022  Reason for Consultation/Follow-up: Establishing goals of care  Patient Profile/HPI:  89 y.o. 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   Subjective: Chart reviewed including labs, progress notes, imaging from this and previous encounters.   Patient is resting in bed, she had bowel movements, discussed with patient, daughter and TRH, agree with starting low dose Fentanyl patch.     Daughter at bedside.  [facility] has seen patient, plan is home with hospice.    Physical Exam Vitals and nursing note reviewed.  Constitutional:      General: She is not in acute distress. Cardiovascular:     Rate and Rhythm: Normal rate.  Pulmonary:     Effort: Pulmonary effort is normal.  Neurological:     Mental Status: She is alert and oriented to person, place, and time.             Vital Signs: BP (!) 116/54 (BP Location: Left Arm)   Pulse 60   Temp 97.8 F (36.6 C) (Oral)   Resp 17   Ht '5\' 6"'$  (1.676 m)   Wt 96.1 kg   SpO2 93%   BMI 34.20 kg/m  SpO2: SpO2: 93 % O2 Device: O2 Device: Room Air O2 Flow Rate: O2 Flow Rate (L/min): 4 L/min  Intake/output summary:  Intake/Output Summary (Last 24 hours) at 12/14/2022 1200 Last data filed at 12/14/2022 0603 Gross per 24 hour  Intake 220 ml  Output 3150 ml  Net -2930 ml    LBM: Last BM Date :  12/13/22 Baseline Weight: Weight: 93.7 kg Most recent weight: Weight: 96.1 kg       Palliative Assessment/Data: 60%      Patient Active Problem List   Diagnosis Date Noted   Malnutrition of moderate degree 12/09/2022   Intractable abdominal pain 12/06/2022   Paroxysmal atrial fibrillation (HCC) 12/06/2022   Metastatic adenocarcinoma of unknown origin ([place]) 12/06/2022   (HFpEF)  heart failure with preserved ejection fraction ([place]) 12/06/2022   Chronic kidney disease, stage 3b ([place]) 12/06/2022   Intractable nausea and vomiting 10/27/2022   Incarcerated hernia 10/27/2022   Dehydration 10/27/2022   Hyperkalemia [id number]   Umbilical hernia without obstruction or gangrene 10/27/2022   Chronic combined systolic and diastolic heart failure ([place]) 08/26/2022   Sick sinus syndrome ([place]) 05/28/2022   UTI (urinary tract infection) 05/28/2022   Acute on chronic systolic CHF (congestive heart failure) ([place]) 05/28/2022   Acute kidney injury superimposed on chronic kidney disease ([place]) 05/28/2022   Persistent atrial fibrillation ([place])    Acute encephalopathy 12/25/2021   HLD (hyperlipidemia) 12/25/2021   Class 1 obesity 12/11/2021   Community acquired pneumonia    Atrial fibrillation with RVR ([place]) 12/09/2021   Respiratory distress 12/08/2021   Normocytic anemia 12/08/2021   Respiratory distress secondary to acute exacerbation of congestive heart failure ([place]) 12/08/2021   Hyponatremia 12/08/2021   Hypothyroidism 12/08/2021   Essential hypertension 12/08/2021    Palliative Care Assessment & Plan    Assessment/Recommendations/Plan    pain regimen discussed - start 12 mcg TDF, continue with Norco.  Bowel regimen noted:   Home with hospice.   Code Status: DNR  Prognosis: Less than 6 months  Discharge Planning: Home with hospice  Care plan was discussed with patient and her daughter  Thank you for allowing the Palliative Medicine Team to assist in the care of this  patient.  Mod MDM  Greater than 50%  of this time was spent counseling and coordinating care related to the above assessment and plan.  Loistine Chance MD Palliative Medicine   Please contact Palliative Medicine Team phone at [phone] for questions and concerns.

## 2022-12-15 DIAGNOSIS — R109 Unspecified abdominal pain: Secondary | ICD-10-CM | POA: Diagnosis not present

## 2022-12-15 LAB — BASIC METABOLIC PANEL
Anion gap: 6 (ref 5–15)
BUN: 18 mg/dL (ref 8–23)
CO2: 21 mmol/L — ABNORMAL LOW (ref 22–32)
Calcium: 7.9 mg/dL — ABNORMAL LOW (ref 8.9–10.3)
Chloride: 102 mmol/L (ref 98–111)
Creatinine, Ser: 1.09 mg/dL — ABNORMAL HIGH (ref 0.44–1.00)
GFR, Estimated: 49 mL/min — ABNORMAL LOW (ref >60)
Glucose, Bld: 92 mg/dL (ref 70–99)
Potassium: 4.3 mmol/L (ref 3.5–5.1)
Sodium: 129 mmol/L — ABNORMAL LOW (ref 135–145)

## 2022-12-15 LAB — MAGNESIUM: Magnesium: 2.5 mg/dL — ABNORMAL HIGH (ref 1.7–2.4)

## 2022-12-15 MED ORDER — ONDANSETRON 4 MG PO TBDP
4.0000 mg | ORAL_TABLET | Freq: Four times a day (QID) | ORAL | Status: DC
  Administered 2022-12-15 – 2022-12-16 (×4): 4 mg via ORAL
  Filled 2022-12-15 (×4): qty 1

## 2022-12-15 MED ORDER — METOCLOPRAMIDE HCL 5 MG/ML IJ SOLN: 5.0000 mg | Freq: Four times a day (QID) | INTRAMUSCULAR | Status: DC | PRN

## 2022-12-15 MED ORDER — GABAPENTIN 100 MG PO CAPS
200.0000 mg | ORAL_CAPSULE | Freq: Three times a day (TID) | ORAL | Status: DC
  Administered 2022-12-15 – 2022-12-16 (×3): 200 mg via ORAL
  Filled 2022-12-15 (×3): qty 2

## 2022-12-15 NOTE — Progress Notes (Signed)
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  On presentation, lipase was less than 10, UA was negative for UTI. CTA abdomen/pelvis negative for evidence for acute mesenteric ischemia. Mild approximately 20% stenosis at the origin of the left renal artery secondary to calcified plaque noted. Persistent diffuse mesenteric edema with small volume free fluid within the mesentery and along the peritoneal reflection is noted. No signs of abscess or pneumoperitoneum. Unchanged small hiatal hernia. Small hiatal hernia, unchanged.  She was given IV fluids and analgesics.  Oncology was consulted.  Assessment & Plan:   Intractable generalized abdominal pain with nausea -Imaging as above.  Questionable cause: Unsure if this is cancer-related pain.  Still having intermittent abdominal pain with nausea.  Right upper quadrant ultrasound on 10/28/2022 had shown no evidence of acute cholecystitis or biliary obstruction -Continue current pain management.  Tolerating some diet. -Continue Protonix IV twice a day.  Continue Carafate along with Maalox as needed -Still has intermittent abdominal pain and does not feel ready to go home today too  Possible UTI: Possibly present on admission -UA suggestive of UTI.  Urine growing Pseudomonas.  Continue cefepime for now.  Possible switch to oral ciprofloxacin on discharge.    Metastatic adenocarcinoma involving umbilical hernia and omentum -Suspicion is for ovarian primary versus primary peritoneal carcinoma.  Oncology following.  Inpatient PET scan  could not be done. -Patient is declining chemotherapy or any further surgery. -Palliative care following -Patient interested in home hospice: TOC/hospice team aware.  Possible discharge in 1 to 2 days if patient feels better. -Still in significant pain intermittently.  After discussion with patient and daughter at bedside on 12/14/2022, switched Norco to scheduled every 6 hours.  Added low-dose fentanyl patch.  Increased gabapentin.  Will increase gabapentin to 200 mg 3 times daily.  Chronic diastolic heart failure Mild aortic regurgitation Hypertension -Currently compensated.  Continue metoprolol succinate.  Aldactone/Entresto/Lasix on hold.  Received 2 doses of IV Lasix so far during the hospitalization.  Resumed Entresto on 12/14/2022 and Lasix from today onwards.  Tachybradycardia syndrome status post PPM placement PAF on Eliquis -Currently rate controlled.  Continue amiodarone, Eliquis and metoprolol succinate.  Outpatient follow-up with cardiology  AKI on chronic kidney disease stage IIIb -Creatinine much improved.  Peaked to 1.97.  Off IV fluids   Hyponatremia -Sodium improved to 129 today.  Diuretic plan as above.  Repeat a.m. labs.  Acute urinary retention -Foley catheter placed on 12/09/2022.  Continue Flomax.  Will need outpatient urology follow-up  Anxiety -Increase dose of gabapentin.  Continue Ativan as needed twice a day for now.    Obesity -Outpatient follow-up  Goals of care -Palliative care following.  Discussion as above.  Constipation -She was put on lactulose and she had multiple bowel movements on 12/13/2022 lactulose was discontinued.  Continue Senokot but will increase to twice a day.  Will add lactulose as needed  DVT prophylaxis: Eliquis Code Status: DNR Family Communication: Daughter at bedside Disposition Plan: Status is: Inpatient Remains inpatient appropriate because: Of severity of illness.  Home with hospice in the next 1 to 2 days if abdominal pain  improves  Consultants: Oncology.  palliative care  Procedures: None  Antimicrobials: None   Subjective: Patient seen and examined at bedside.  Does not feel ready to go home today.  Feels weak, anxious, nauseous and is having abdominal pain. Objective: Vitals:   12/14/22 1435 12/14/22 2040 12/15/22 0500 12/15/22 0637  BP: 128/61 (!) 114/58  (!) 128/55  Pulse: 63 60  64  Resp: '18 18  18  '$ Temp: 97.8 F (36.6 C) 97.7 F (36.5 C)  98.2 F (36.8 C)  TempSrc: Oral Oral  Oral  SpO2: 98% 97%  93%  Weight:   95.7 kg   Height:        Intake/Output Summary (Last 24 hours) at 12/15/2022 1047 Last data filed at 12/15/2022 0959 Gross per 24 hour  Intake 600 ml  Output 3200 ml  Net -2600 ml    Filed Weights   12/11/22 0606 12/14/22 0500 12/15/22 0500  Weight: 95.4 kg 96.1 kg 95.7 kg    Examination:  General: On room air.  No distress.  Chronically ill and deconditioned looking. ENT/neck: No palpable thyromegaly or JVD elevation noted  respiratory: Decreased breath sounds at bases bilaterally with some crackles  CVS: S1-S2 heard; rate controlled  abdominal: Soft, obese, tender in the lower quadrants; still distended; no organomegaly, bowel sounds heard  extremities: Bilateral lower extremity edema present; no clubbing  CNS: Awake and alert.  No focal neurologic deficit.  Moving extremities  lymph: No palpable lymphadenopathy  Skin: No lesions/rashes  psych: Looks very anxious currently.  Not agitated. Musculoskeletal: No obvious joint swelling/deformity Genitourinary: Still has a Foley catheter  data Reviewed: I have personally reviewed following labs and imaging studies  CBC: Recent Labs  Lab 12/09/22 0603 12/10/22 0555 12/12/22 0631  WBC 9.1 6.7 7.9  NEUTROABS 6.9 4.7 6.8  HGB 10.1* 9.1* 9.4*  HCT 32.8* 31.1* 29.5*  MCV 92.4 95.1 88.6  PLT 237 231 [id number]    Basic Metabolic Panel: Recent Labs  Lab 12/11/22 0600 12/12/22 0631 12/13/22 0714 12/14/22 0643  12/15/22 0509  NA 130* 128* 123* 128* 129*  K 4.3 4.8 3.9 4.2 4.3  CL 104 101 97* 99 102  CO2 20* 20* 20* 21* 21*  GLUCOSE 98 129* 88 78 92  BUN '16 20 20 22 18  '$ CREATININE 0.96 0.98 0.95 1.10* 1.09*  CALCIUM 7.9* 8.3* 7.9* 8.2* 7.9*  MG 1.8 1.8 1.6* 1.6* 2.5*    GFR: Estimated Creatinine Clearance: 40.8 mL/min (A) (by C-G formula based on SCr of 1.09 mg/dL (H)). Liver Function Tests: Recent Labs  Lab 12/09/22 0603 12/09/22 1401 12/10/22 0555  AST '19 19 15  '$ ALT '14 14 13  '$ ALKPHOS 84 75 68  BILITOT 0.4 0.5 0.3  PROT 5.4* 5.3* 5.0*  ALBUMIN 2.6* 2.4* 2.3*    No results for input(s): "LIPASE", "AMYLASE" in the last 168 hours.  No results for input(s): "AMMONIA" in the last 168 hours. Coagulation Profile: No results for input(s): "INR", "PROTIME" in the last 168 hours. Cardiac Enzymes: No results for input(s): "CKTOTAL", "CKMB", "CKMBINDEX", "TROPONINI" in the last 168 hours. BNP (last 3 results) No results for input(s): "PROBNP" in the last 8760 hours. HbA1C: No results for input(s): "HGBA1C" in the last 72 hours. CBG: No results for input(s): "GLUCAP" in the last 168 hours. Lipid Profile: No results for input(s): "CHOL", "HDL", "LDLCALC", "TRIG", "CHOLHDL", "LDLDIRECT" in the last 72 hours. Thyroid Function Tests: No results for input(s): "TSH", "T4TOTAL", "FREET4", "T3FREE", "THYROIDAB" in the last 72 hours. Anemia Panel: No results  for input(s): "VITAMINB12", "FOLATE", "FERRITIN", "TIBC", "IRON", "RETICCTPCT" in the last 72 hours. Sepsis Labs: No results for input(s): "PROCALCITON", "LATICACIDVEN" in the last 168 hours.   Recent Results (from the past 240 hour(s))  Urine Culture (for pregnant, neutropenic or urologic patients or patients with an indwelling urinary catheter)     Status: Abnormal   Collection Time: 12/10/22  1:29 PM   Specimen: Urine, Catheterized  Result Value Ref Range Status   Specimen Description   Final    URINE, CATHETERIZED Performed at  [facility] [address] [place], [place] [address]    Special Requests   Final    NONE Performed at [facility], [place] [address] [place], [place] [address]    Culture (A)  Final    >=100,000 COLONIES/mL PSEUDOMONAS AERUGINOSA 60,000 COLONIES/mL KLEBSIELLA PNEUMONIAE    Report Status 12/14/2022 FINAL  Final   Organism ID, Bacteria PSEUDOMONAS AERUGINOSA (A)  Final   Organism ID, Bacteria KLEBSIELLA PNEUMONIAE (A)  Final      Susceptibility   Klebsiella pneumoniae - MIC*    AMPICILLIN RESISTANT Resistant     CEFAZOLIN <=4 SENSITIVE Sensitive     CEFEPIME <=0.12 SENSITIVE Sensitive     CEFTRIAXONE <=0.25 SENSITIVE Sensitive     CIPROFLOXACIN <=0.25 SENSITIVE Sensitive     GENTAMICIN <=1 SENSITIVE Sensitive     IMIPENEM <=0.25 SENSITIVE Sensitive     NITROFURANTOIN 64 INTERMEDIATE Intermediate     TRIMETH/SULFA <=20 SENSITIVE Sensitive     AMPICILLIN/SULBACTAM <=2 SENSITIVE Sensitive     PIP/TAZO <=4 SENSITIVE Sensitive     * 60,000 COLONIES/mL KLEBSIELLA PNEUMONIAE   Pseudomonas aeruginosa - MIC*    CEFTAZIDIME 2 SENSITIVE Sensitive     CIPROFLOXACIN 0.5 SENSITIVE Sensitive     GENTAMICIN <=1 SENSITIVE Sensitive     IMIPENEM 2 SENSITIVE Sensitive     PIP/TAZO <=4 SENSITIVE Sensitive     CEFEPIME 2 SENSITIVE Sensitive     * >=100,000 COLONIES/mL PSEUDOMONAS AERUGINOSA         Radiology Studies: No results found.      Scheduled Meds:  amiodarone  200 mg Oral Daily   apixaban  5 mg Oral BID   Chlorhexidine Gluconate Cloth  6 each Topical Daily   feeding supplement  237 mL Oral BID BM   fentaNYL  1 patch Transdermal Q72H   furosemide  20 mg Oral Daily   gabapentin  100 mg Oral TID   HYDROcodone-acetaminophen  1 tablet Oral Q6H   levothyroxine  225 mcg Oral Q0600   metoprolol succinate  12.5 mg Oral Daily   pantoprazole  40 mg Oral BID   sacubitril-valsartan  1 tablet Oral BID   senna-docusate  2 tablet Oral Daily    sucralfate  1 g Oral TID WC & HS   tamoxifen  20 mg Oral Daily   tamsulosin  0.4 mg Oral QPC supper   Continuous Infusions:  ceFEPime (MAXIPIME) IV 1 g (12/15/22 0001)          Aline August, MD [facility] 12/15/2022, 10:47 AM

## 2022-12-15 NOTE — Progress Notes (Signed)
[facility]) hospital liaison note   Patient is referred for hospice services at home.    Liaison following for discharge disposition. Per patient and daughter, hopeful to work with PT today and DC home tomorrow by POV.   Please don't hesitate to reach out for any hospice related questions or concerns.    Thank you for the opportunity to participate in this patient's care   Margaretmary Eddy, BSN, RN [facility] Liaison  [phone]

## 2022-12-15 NOTE — Progress Notes (Signed)
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 CTA abdomen/pelvis negative for evidence for acute mesenteric ischemia. Mild approximately 20% stenosis at the origin of the left renal artery secondary to calcified plaque noted. Persistent diffuse mesenteric edema with small volume free fluid within the mesentery and along the peritoneal reflection is noted.    PT Comments    The patient demonstrates  improved balance and ambulation and  endurance, Ambulated x 3 with seated rest breaks. Plans are for Dc home with Hospice.   Recommendations for follow up therapy are one component of a multi-disciplinary discharge planning process, led by the attending physician.  Recommendations may be updated based on patient status, additional functional criteria and insurance authorization.  Follow Up Recommendations   (HH if elligible)     Assistance Recommended at Discharge Frequent or constant Supervision/Assistance  Patient can return home with the following A little help with walking and/or transfers;A little help with bathing/dressing/bathroom;Assistance with cooking/housework;Assist for transportation;Help with stairs or ramp for entrance;Direct supervision/assist for medications management   Equipment Recommendations  None recommended by PT    Recommendations for Other Services       Precautions / Restrictions Precautions Precautions: Fall Restrictions Weight Bearing Restrictions: No     Mobility  Bed Mobility   Bed Mobility: Supine to  Sit     Supine to sit: Min guard, HOB elevated     General bed mobility comments: Pt sleeps in a lift chair at home; explaind to patient and dtr how important it is that she try and lay/prop on her side as much as possible in the bed and in her lift chair to help the pressure area heal at sacrum    Transfers Overall transfer level: Needs assistance Equipment used: Rolling walker (2 wheels)               General transfer comment: min ,assist to rise from bed and recliner and low toilet with rail    Ambulation/Gait Ambulation/Gait assistance: Min guard Gait Distance (Feet): 50 Feet (40' ,60',20') Assistive device: Rolling walker (2 wheels) Gait Pattern/deviations: Step-to pattern, Decreased stride length, Step-through pattern Gait velocity: decreased     General Gait Details: patient tolerated ambulation well, seated rest breaks as needed   Stairs             Wheelchair Mobility    Modified Rankin (Stroke Patients Only)       Balance Overall balance assessment: Mild deficits observed, not formally tested                                          Cognition Arousal/Alertness: Awake/alert Behavior During Therapy: WFL for tasks assessed/performed                                   General Comments: patient more cogniznat, still with some memory deficits, able to participate in therapy readily        Exercises      General Comments  Pertinent Vitals/Pain Pain Assessment Faces Pain Scale: Hurts little more Pain Location: skin breakdown at sacrum, right side abd Pain Descriptors / Indicators: Nagging, Sore Pain Intervention(s): Monitored during session    Home Living                          Prior Function            PT Goals (current goals can now be found in the care plan section) Progress towards PT goals: Progressing toward goals    Frequency    Min 3X/week      PT Plan Current plan  remains appropriate    Co-evaluation   Reason for Co-Treatment: For patient/therapist safety;To address functional/ADL transfers PT goals addressed during session: Mobility/safety with mobility;Balance;Proper use of DME;Strengthening/ROM OT goals addressed during session: Strengthening/ROM;ADL's and self-care      AM-PAC PT "6 Clicks" Mobility   Outcome Measure  Help needed turning from your back to your side while in a flat bed without using bedrails?: A Little Help needed moving from lying on your back to sitting on the side of a flat bed without using bedrails?: A Little Help needed moving to and from a bed to a chair (including a wheelchair)?: A Little Help needed standing up from a chair using your arms (e.g., wheelchair or bedside chair)?: A Little Help needed to walk in hospital room?: A Little Help needed climbing 3-5 steps with a railing? : A Lot 6 Click Score: 17    End of Session Equipment Utilized During Treatment: Gait belt Activity Tolerance: Patient tolerated treatment well Patient left: in chair;with call bell/phone within reach;with family/visitor present Nurse Communication: Mobility status PT Visit Diagnosis: Other abnormalities of gait and mobility (R26.89);Muscle weakness (generalized) (M62.81)     Time: [id number] PT Time Calculation (min) (ACUTE ONLY): 28 min  Charges:  $Gait Training: 8-22 mins                     [place] Office [phone] Weekend [id number]    Claretha Cooper 12/15/2022, 3:25 PM

## 2022-12-15 NOTE — Progress Notes (Signed)
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 CTA abdomen/pelvis negative for evidence for acute mesenteric ischemia. Mild approximately 20% stenosis at the origin of the left renal artery secondary to calcified plaque noted. Persistent diffuse mesenteric edema with small volume free fluid within the mesentery and along the peritoneal reflection is noted.   OT comments  Ms. Kelley is moving better today not only for bed mobility but also with ambulation with RW. She also was able to go into the bathroom and use the regular toilet with grab bar A for sit<>stand at a min A level. Needed A for front and back pericare due to sore areas at both. She was able tolerate increased standing time for these tasks. She will continue to benefit from acute OT without follow up due to going home with hospice.   Recommendations for follow up therapy are one component of a multi-disciplinary discharge planning process, led by the attending physician.  Recommendations may be updated based on patient status, additional functional criteria and insurance authorization.    Follow Up Recommendations  No OT follow up     Assistance Recommended at Discharge Frequent or constant Supervision/Assistance  Patient can return home with the following  A little help with walking and/or transfers;A lot of help with bathing/dressing/bathroom;Assistance with cooking/housework;Help with stairs or ramp for entrance;Assist for transportation   Equipment Recommendations  None  recommended by OT       Precautions / Restrictions Precautions Precautions: Fall Restrictions Weight Bearing Restrictions: No       Mobility Bed Mobility Overal bed mobility: Needs Assistance Bed Mobility: Supine to Sit     Supine to sit: Min guard, HOB elevated     General bed mobility comments: Pt sleeps in a lift chair at home; explaind to patient and dtr how important it is that she try and lay/prop on her side as much as possible in the bed and in her lift chair to help the pressure area heal at sacrum    Transfers Overall transfer level: Needs assistance Equipment used: Rolling walker (2 wheels) Transfers: Sit to/from Stand Sit to Stand: Min guard, Min assist (dependent on surface height)           General transfer comment: see PT note for distance of ambulation         ADL either performed or assessed with clinical judgement   ADL Overall ADL's : Needs assistance/impaired                         Toilet Transfer: Rolling walker (2 wheels);Minimal assistance;Regular Toilet;Grab bars   Toileting- Clothing Manipulation and Hygiene: Moderate assistance Toileting - Clothing Manipulation Details (indicate cue type and reason): to A with back peri care due to sore tailbone with skin  breakdown; min A sit<>stand            Extremity/Trunk Assessment Upper Extremity Assessment Upper Extremity Assessment: Overall WFL for tasks assessed            Vision Baseline Vision/History: 1 Wears glasses Ability to See in Adequate Light: 0 Adequate Patient Visual Report: No change from baseline  Cognition Arousal/Alertness: Awake/alert Behavior During Therapy: WFL for tasks assessed/performed Overall Cognitive Status: Within Functional Limits for tasks assessed                                                     Pertinent Vitals/ Pain       Pain Assessment Pain Assessment: Faces Faces Pain Scale: Hurts little  more Pain Location: skin breakdown at sacrum Pain Descriptors / Indicators: Nagging, Sore Pain Intervention(s): Limited activity within patient's tolerance, Monitored during session (applied new mepilex to area as well as barrier cream)         Frequency  Min 2X/week        Progress Toward Goals  OT Goals(current goals can now be found in the care plan section)  Progress towards OT goals: Progressing toward goals  Acute Rehab OT Goals Patient Stated Goal: to go home tomorrow OT Goal Formulation: With patient/family Time For Goal Achievement: 12/24/22 Potential to Achieve Goals: [place] Discharge plan remains appropriate    Co-evaluation    PT/OT/SLP Co-Evaluation/Treatment: Yes Reason for Co-Treatment: For patient/therapist safety;To address functional/ADL transfers PT goals addressed during session: Mobility/safety with mobility;Balance;Proper use of DME;Strengthening/ROM OT goals addressed during session: Strengthening/ROM;ADL's and self-care      AM-PAC OT "6 Clicks" Daily Activity     Outcome Measure   Help from another person eating meals?: None Help from another person taking care of personal grooming?: A Little Help from another person toileting, which includes using toliet, bedpan, or urinal?: A Lot Help from another person bathing (including washing, rinsing, drying)?: A Lot Help from another person to put on and taking off regular upper body clothing?: A Little Help from another person to put on and taking off regular lower body clothing?: A Lot 6 Click Score: 16    End of Session Equipment Utilized During Treatment: Rolling walker (2 wheels);Gait belt  OT Visit Diagnosis: Unsteadiness on feet (R26.81);Other abnormalities of gait and mobility (R26.89);Muscle weakness (generalized) (M62.81);Pain Pain - part of body:  (sacrum with wound)   Activity Tolerance Patient tolerated treatment well   Patient Left in chair;with call bell/phone within  reach;with nursing/sitter in room   Nurse Communication  (replace mepilex on sacrum (small square one on wound and sacral pad on top)        Time: [id number] OT Time Calculation (min): 33 min  Charges: OT General Charges $OT Visit: 1 Visit OT Treatments $Self Care/Home Management : 8-22 mins  [place] Office [phone]    Almon Register 12/15/2022, 2:37 PM

## 2022-12-15 NOTE — Progress Notes (Signed)
Encouraged patient to use tilt or side-lying techniques. She does not like to lay on her side.  Pillows used for a slight tilt and she tolerated that for a short time, but then asked the pillows to be removed.

## 2022-12-16 ENCOUNTER — Inpatient Hospital Stay: Payer: Medicare HMO | Admitting: Nurse Practitioner

## 2022-12-16 ENCOUNTER — Inpatient Hospital Stay: Payer: Medicare HMO

## 2022-12-16 DIAGNOSIS — Z515 Encounter for palliative care: Secondary | ICD-10-CM | POA: Diagnosis not present

## 2022-12-16 DIAGNOSIS — R109 Unspecified abdominal pain: Secondary | ICD-10-CM | POA: Diagnosis not present

## 2022-12-16 DIAGNOSIS — N1832 Chronic kidney disease, stage 3b: Secondary | ICD-10-CM | POA: Diagnosis not present

## 2022-12-16 DIAGNOSIS — C799 Secondary malignant neoplasm of unspecified site: Secondary | ICD-10-CM | POA: Diagnosis not present

## 2022-12-16 LAB — BASIC METABOLIC PANEL
Anion gap: 7 (ref 5–15)
BUN: 16 mg/dL (ref 8–23)
CO2: 22 mmol/L (ref 22–32)
Calcium: 7.9 mg/dL — ABNORMAL LOW (ref 8.9–10.3)
Chloride: 102 mmol/L (ref 98–111)
Creatinine, Ser: 0.99 mg/dL (ref 0.44–1.00)
GFR, Estimated: 55 mL/min — ABNORMAL LOW (ref >60)
Glucose, Bld: 90 mg/dL (ref 70–99)
Potassium: 4 mmol/L (ref 3.5–5.1)
Sodium: 131 mmol/L — ABNORMAL LOW (ref 135–145)

## 2022-12-16 LAB — MAGNESIUM: Magnesium: 1.9 mg/dL (ref 1.7–2.4)

## 2022-12-16 MED ORDER — CIPROFLOXACIN HCL 500 MG PO TABS: 500.0000 mg | ORAL_TABLET | Freq: Two times a day (BID) | ORAL | 0 refills | Status: AC

## 2022-12-16 MED ORDER — TAMOXIFEN CITRATE 20 MG PO TABS: 20.0000 mg | ORAL_TABLET | Freq: Every day | ORAL | 0 refills | Status: DC

## 2022-12-16 MED ORDER — FUROSEMIDE 20 MG PO TABS: 20.0000 mg | ORAL_TABLET | Freq: Every day | ORAL | Status: DC

## 2022-12-16 MED ORDER — FENTANYL 12 MCG/HR TD PT72: 1.0000 | MEDICATED_PATCH | TRANSDERMAL | 0 refills | Status: DC

## 2022-12-16 MED ORDER — PANTOPRAZOLE SODIUM 40 MG PO TBEC: 40.0000 mg | DELAYED_RELEASE_TABLET | Freq: Two times a day (BID) | ORAL | 0 refills | Status: DC

## 2022-12-16 MED ORDER — LACTULOSE 10 GM/15ML PO SOLN: 10.0000 g | Freq: Every day | ORAL | 0 refills | Status: DC | PRN

## 2022-12-16 MED ORDER — HYDROCODONE-ACETAMINOPHEN 5-325 MG PO TABS: 1.0000 | ORAL_TABLET | Freq: Four times a day (QID) | ORAL | 0 refills | Status: DC

## 2022-12-16 MED ORDER — ONDANSETRON 4 MG PO TBDP: 4.0000 mg | ORAL_TABLET | Freq: Four times a day (QID) | ORAL | 0 refills | Status: DC

## 2022-12-16 MED ORDER — SUCRALFATE 1 GM/10ML PO SUSP: 1.0000 g | Freq: Three times a day (TID) | ORAL | 0 refills | Status: DC

## 2022-12-16 MED ORDER — GABAPENTIN 100 MG PO CAPS: 200.0000 mg | ORAL_CAPSULE | Freq: Three times a day (TID) | ORAL | 0 refills | Status: AC

## 2022-12-16 MED ORDER — SENNOSIDES-DOCUSATE SODIUM 8.6-50 MG PO TABS: 2.0000 | ORAL_TABLET | Freq: Two times a day (BID) | ORAL | 0 refills | Status: DC

## 2022-12-16 NOTE — Consult Note (Signed)
   [facility] CM Inpatient Consult   12/16/2022  [name] Oct 16, 1930 [id number]  [facility] [[facility]] Patient: [name] Medicine  Chart reviewed and reveals the patient is currently transitioning to Home with Hospice  Plan: Patient will have full case management services through Hospice and needs will be met at the hospice level of care. No [facility] Care Management is planned for transitional needs. Will sign off at transition from hospital.  For questions,   Natividad Brood, RN BSN [place]  [phone] business mobile phone Toll free office [phone]  *[place]  [phone] Fax number: [phone] [email or web address]

## 2022-12-16 NOTE — Progress Notes (Signed)
Daily Progress Note   Patient Name: [name]       Date: 12/16/2022 DOB: Mar 20, 1931  Age: 89 y.o. MRN#: [id number] Attending Physician: Aline August, MD Primary Care Physician: Deon Pilling, NP Admit Date: 12/06/2022  Reason for Consultation/Follow-up: Establishing goals of care  Patient Profile/HPI: 89 y.o. 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     Subjective: Chart reviewed including labs, progress notes, imaging from this and previous encounters.  [name] is in good spirits. Looking forward to going home, crocheting, making jewelry and eating well. She notes that the joy of eating has returned in the past few days.  Her pain is well controlled.    Physical Exam Vitals and nursing note reviewed.  Neurological:     Mental Status: She is alert and oriented to person, place, and time.  Psychiatric:        Behavior: Behavior normal.             Vital Signs: BP (!) 109/48   Pulse 61   Temp 97.8 F (36.6 C) (Oral)   Resp 18   Ht '5\' 6"'$  (1.676 m)   Wt 96.4 kg   SpO2 94%   BMI 34.30 kg/m  SpO2: SpO2: 94 % O2 Device: O2 Device: Room Air O2 Flow Rate: O2 Flow Rate (L/min): 4 L/min  Intake/output summary:  Intake/Output Summary (Last 24 hours) at 12/16/2022 1202 Last data filed at 12/16/2022 1140 Gross per 24 hour  Intake 700 ml  Output 3275 ml  Net -2575 ml   LBM: Last BM Date : 12/14/22 Baseline Weight: Weight: 93.7 kg Most recent weight: Weight: 96.4 kg       Palliative Assessment/Data: PPS: 50%      Patient Active Problem List    Diagnosis Date Noted   Malnutrition of moderate degree 12/09/2022   Intractable abdominal pain 12/06/2022   Paroxysmal atrial fibrillation (HCC) 12/06/2022   Metastatic adenocarcinoma of unknown origin ([place]) 12/06/2022   (HFpEF) heart failure with preserved ejection fraction ([place]) 12/06/2022   Chronic kidney disease, stage 3b ([place]) 12/06/2022   Intractable nausea and vomiting 10/27/2022   Incarcerated hernia 10/27/2022  Dehydration 10/27/2022   Hyperkalemia [id number]   Umbilical hernia without obstruction or gangrene 10/27/2022   Chronic combined systolic and diastolic heart failure ([place]) 08/26/2022   Sick sinus syndrome ([place]) 05/28/2022   UTI (urinary tract infection) 05/28/2022   Acute on chronic systolic CHF (congestive heart failure) ([place]) 05/28/2022   Acute kidney injury superimposed on chronic kidney disease ([place]) 05/28/2022   Persistent atrial fibrillation ([place])    Acute encephalopathy 12/25/2021   HLD (hyperlipidemia) 12/25/2021   Class 1 obesity 12/11/2021   Community acquired pneumonia    Atrial fibrillation with RVR ([place]) 12/09/2021   Respiratory distress 12/08/2021   Normocytic anemia 12/08/2021   Respiratory distress secondary to acute exacerbation of congestive heart failure ([place]) 12/08/2021   Hyponatremia 12/08/2021   Hypothyroidism 12/08/2021   Essential hypertension 12/08/2021    Palliative Care Assessment & Plan    Assessment/Recommendations/Plan  Plan for d/c home with hospice today Pain and other symptoms are well controlled- no changes needed Discussed the importance of good bowel prophylaxis- she has not had a BM in 3 days- plans to drink warm prune juice and home and will use lactulose tomorrow if no BM   Code Status: DNR  Prognosis:  < 6 months  Discharge Planning: Home with Hospice  Care plan was discussed with patient and daughter.   Thank you for allowing the Palliative Medicine Team to assist in the care of this patient.  Total time:  45 minutes  Greater than 50%  of this time was spent counseling and coordinating care related to the above assessment and plan.  Mariana Kaufman, AGNP-C Palliative Medicine   Please contact Palliative Medicine Team phone at [phone] for questions and concerns.

## 2022-12-16 NOTE — Plan of Care (Signed)
  Problem: Clinical Measurements: Goal: Ability to maintain clinical measurements within normal limits will improve Outcome: Progressing   Problem: Clinical Measurements: Goal: Will remain free from infection Outcome: Progressing   Problem: Clinical Measurements: Goal: Diagnostic test results will improve Outcome: Progressing   

## 2022-12-16 NOTE — Discharge Summary (Signed)
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  On presentation, lipase was less than 10, UA was negative for UTI. CTA abdomen/pelvis negative for evidence for acute mesenteric ischemia. Mild approximately 20% stenosis at the origin of the left renal artery secondary to calcified plaque noted. Persistent diffuse mesenteric edema with small volume free fluid within the mesentery and along the peritoneal reflection is noted. No signs of abscess or pneumoperitoneum. Unchanged small hiatal hernia. Small hiatal hernia, unchanged.  She was given IV fluids and analgesics.  Oncology was consulted.  During the hospitalization, she has decided to pursue home hospice.  She has had issues with intermittent abdominal pain with vomiting requiring initiation of round-the-clock narcotic along with fentanyl patch.  Urine culture grew Pseudomonas and she is currently on cefepime, will switch to oral ciprofloxacin for 2 more days on discharge today.  She also has Foley catheter for urinary retention.  Overall prognosis is very poor.  She will be discharged home today with close outpatient follow-up with home hospice.  Discharge  Diagnoses:   Intractable generalized abdominal pain with nausea -Imaging as above.  Questionable cause: Unsure if this is cancer-related pain.  Still having intermittent abdominal pain with nausea.  Right upper quadrant ultrasound on 10/28/2022 had shown no evidence of acute cholecystitis or biliary obstruction -Continue current pain management.  Tolerating some diet. -Currently on Protonix IV twice a day along with oral Carafate -Feels much better this morning and wants to go home today.  Discharge patient home today on oral Protonix twice a day along with Carafate.    Possible UTI: Possibly present on admission -UA suggestive of UTI.  Urine growing Pseudomonas.  Currently on cefepime.  Switch to ciprofloxacin for 2 more days on discharge today which will complete 5-day course of effective therapy for Pseudomonas.   Metastatic adenocarcinoma involving umbilical hernia and omentum -Suspicion is for ovarian primary versus primary peritoneal carcinoma.  Oncology following.  Inpatient PET scan could not be done. -Patient is declining chemotherapy or any further surgery. -Palliative care following -Patient interested in home hospice: hospice team aware.   -She has had issues with intermittent abdominal pain during the hospitalization.  After discussion with patient and daughter at bedside on 12/14/2022, switched Norco to scheduled every 6 hours.  Added low-dose fentanyl patch.  Increased gabapentin.  Subsequently, increased gabapentin to 200 mg 3 times daily on 12/15/2022. -Pain is better controlled today: Discharge patient on scheduled Norco along with fentanyl patch and oral gabapentin.   Chronic diastolic heart failure Mild aortic regurgitation Hypertension -Currently compensated.  Continue metoprolol succinate.  Aldactone on hold.  Received 2 doses of IV Lasix so far during the hospitalization.  Resumed Entresto on 12/14/2022 and Lasix from 12/15/2022 onwards.   Tachybradycardia syndrome status  post PPM placement PAF on Eliquis -Currently rate controlled.  Continue amiodarone, Eliquis and metoprolol succinate.  AKI on chronic kidney disease stage IIIb -Creatinine much improved.  Peaked to 1.97.  Off IV fluids    Hyponatremia -Sodium improved to 131 today.  Diuretic plan as above.     Acute urinary retention -Foley catheter placed on 12/09/2022.  Currently on Flomax.  Daughter does not want to continue Flomax on discharge.  Daughter wants to see how she does at home with hospice: If things start improving at home, she is hopeful that Foley catheter can be removed at some point by hospice   anxiety -Continue gabapentin as above.  Continue Ativan at night   Obesity -Outpatient follow-up   Goals of care -Palliative care following.  Discussion as above.   Constipation -She was put on lactulose and she had multiple bowel movements on 12/13/2022 lactulose was discontinued.  Continue Senokot  twice a day.  Daughter agreeable to lactulose as needed  Discharge Instructions  Discharge Instructions     Diet general   Complete by: As directed    Increase activity slowly   Complete by: As directed       Allergies as of 12/16/2022       Reactions   Atropine Other (See Comments)   Made her entire body jump after they gave it to her.   Miralax [polyethylene Glycol] Other (See Comments)   17g dose caused stomach troubles for 3 days    Prozac [fluoxetine] Other (See Comments)   Hyponatremia    Serotonin Reuptake Inhibitors (ssris) Other (See Comments)   Hyponatremia    Keflex [cephalexin] Other (See Comments)   Unknown reaction Tolerated Cefdinir 05/2022        Medication List     STOP taking these medications    Eye Vitamins Caps   famotidine 20 MG tablet Commonly known as: PEPCID   omeprazole 10 MG capsule Commonly known as: PRILOSEC   polyethylene glycol 17 g packet Commonly known as: MiraLax   spironolactone 25 MG tablet Commonly known as: ALDACTONE    VITAMIN D-3 PO       TAKE these medications    acetaminophen 500 MG tablet Commonly known as: TYLENOL Take 1,000 mg by mouth every 6 (six) hours as needed for mild pain.   amiodarone 200 MG tablet Commonly known as: PACERONE Take 1 tablet (200 mg total) by mouth daily.   apixaban 5 MG Tabs tablet Commonly known as: Eliquis Take 1 tablet (5 mg total) by mouth 2 (two) times daily.   benzonatate 100 MG capsule Commonly known as: Tessalon Perles Take 1 capsule (100 mg total) by mouth 3 (three) times daily as needed for cough.   carboxymethylcellulose 0.5 % Soln Commonly known as: REFRESH PLUS Place 1 drop into both eyes daily as needed (dry eyes).   DELSYM PO Take 10 mLs by mouth 2 (two) times daily as needed (cough).   Entresto 24-26 MG Generic drug: sacubitril-valsartan Take 1 tablet by mouth 2 (two) times daily.   fentaNYL 12 MCG/HR Commonly known as: [place] 1 patch onto the skin every 3 (three) days. Start taking on: December 17, 2022   fluticasone 50 MCG/ACT nasal spray Commonly known as: FLONASE Place 1 spray into both nostrils daily.   fluticasone-salmeterol 100-50 MCG/ACT Aepb Commonly known as: [place] Inhub Inhale 1 puff into the lungs 2 (two) times daily.   furosemide 20 MG tablet Commonly known as: LASIX Take 1 tablet (20 mg total) by mouth daily.   gabapentin 100 MG capsule Commonly known as: NEURONTIN Take 2 capsules (200  mg total) by mouth 3 (three) times daily.   HYDROcodone-acetaminophen 5-325 MG tablet Commonly known as: NORCO/VICODIN Take 1 tablet by mouth every 6 (six) hours.   ipratropium 0.06 % nasal spray Commonly known as: ATROVENT Place 1 spray into both nostrils 2 (two) times daily.   ketoconazole 2 % cream Commonly known as: NIZORAL Apply 1 Application topically 2 (two) times daily as needed for rash.   lactulose 10 GM/15ML solution Commonly known as: CHRONULAC Take 15 mLs (10 g total) by mouth daily as needed for mild  constipation.   levothyroxine 200 MCG tablet Commonly known as: SYNTHROID Take 200 mcg by mouth daily.   levothyroxine 25 MCG tablet Commonly known as: SYNTHROID Take 25 mcg by mouth daily.   LORazepam 0.5 MG tablet Commonly known as: ATIVAN Take 0.5 mg by mouth at bedtime.   lovastatin 40 MG tablet Commonly known as: MEVACOR Take 40 mg by mouth daily with supper.   metoprolol succinate 25 MG 24 hr tablet Commonly known as: TOPROL-XL Take 0.5 tablets (12.5 mg total) by mouth daily.   ondansetron 4 MG disintegrating tablet Commonly known as: ZOFRAN-ODT Take 1 tablet (4 mg total) by mouth every 6 (six) hours.   pantoprazole 40 MG tablet Commonly known as: PROTONIX Take 1 tablet (40 mg total) by mouth 2 (two) times daily.   potassium chloride 10 MEQ tablet Commonly known as: KLOR-CON M Take 2 tablets (20 mEq total) by mouth daily.   senna-docusate 8.6-50 MG tablet Commonly known as: Senokot-S Take 2 tablets by mouth 2 (two) times daily.   sucralfate 1 GM/10ML suspension Commonly known as: CARAFATE Take 10 mLs (1 g total) by mouth 4 (four) times daily -  with meals and at bedtime.   tamoxifen 20 MG tablet Commonly known as: NOLVADEX Take 1 tablet (20 mg total) by mouth daily. Start taking on: December 17, 2022   THIAMINE PO Take 1 tablet by mouth daily with lunch. Vitamin B-1, unknown strength.   triamcinolone cream 0.1 % Commonly known as: KENALOG Apply 1 Application topically 2 (two) times daily as needed (flare ups).   VITAMIN B-12 PO Take 1 tablet by mouth daily with lunch.        Follow-up Information     Home hospice Follow up.   Why: At earliest convenience               Allergies  Allergen Reactions   Atropine Other (See Comments)    Made her entire body jump after they gave it to her.   Miralax [Polyethylene Glycol] Other (See Comments)    17g dose caused stomach troubles for 3 days    Prozac [Fluoxetine] Other (See Comments)     Hyponatremia    Serotonin Reuptake Inhibitors (Ssris) Other (See Comments)    Hyponatremia    Keflex [Cephalexin] Other (See Comments)    Unknown reaction Tolerated Cefdinir 05/2022    Consultations: Oncology/palliative care   Procedures/Studies: CT Angio Abd/Pel W and/or Wo Contrast  Result Date: 12/06/2022 CLINICAL DATA:  Evaluate for acute mesenteric ischemia. Follow-up prior CT. EXAM: CTA ABDOMEN AND PELVIS WITHOUT AND WITH CONTRAST TECHNIQUE: Multidetector CT imaging of the abdomen and pelvis was performed using the standard protocol during bolus administration of intravenous contrast. Multiplanar reconstructed images and MIPs were obtained and reviewed to evaluate the vascular anatomy. RADIATION DOSE REDUCTION: This exam was performed according to the departmental dose-optimization program which includes automated exposure control, adjustment of the mA and/or kV according to patient size  and/or use of iterative reconstruction technique. CONTRAST:  29m OMNIPAQUE IOHEXOL 350 MG/ML SOLN COMPARISON:  12/06/2022 FINDINGS: VASCULAR Aorta: Aortic atherosclerotic calcifications. Normal caliber aorta without aneurysm, dissection, vasculitis or significant stenosis. Celiac: Patent without evidence of aneurysm, dissection, vasculitis or significant stenosis. SMA: Patent without evidence of aneurysm, dissection, vasculitis or significant stenosis. Renals: Mild, approximately 20% stenosis at the origin of the left renal artery secondary to calcified plaque. Right renal artery is patent. IMA: Patent without evidence of aneurysm, dissection, vasculitis or significant stenosis. Inflow: Patent without evidence of aneurysm, dissection, vasculitis or significant stenosis. Proximal Outflow: Bilateral common femoral and visualized portions of the superficial and profunda femoral arteries are patent without evidence of aneurysm, dissection, vasculitis or significant stenosis. Veins: No obvious venous abnormality  within the limitations of this arterial phase study. Review of the MIP images confirms the above findings. NON-VASCULAR Lower chest: No acute abnormality. Hepatobiliary: No suspicious enhancing liver lesion identified. Status post cholecystectomy. No bile duct dilatation identified. Pancreas: Unremarkable. No pancreatic ductal dilatation or surrounding inflammatory changes. Spleen: Normal in size without focal abnormality. Adrenals/Urinary Tract: Normal adrenal glands. Unchanged simple appearing right kidney cyst measuring 5.5 cm. No follow-up imaging recommended. No nephrolithiasis, hydronephrosis or suspicious mass. Bladder is unremarkable. Stomach/Bowel: Unchanged small hiatal hernia. Stomach is otherwise within normal limits. No pathologic dilatation of the large or small bowel loops to suggest a bowel obstruction. Colonic diverticulosis. No bowel wall thickening, inflammation, or distension. No signs of pneumatosis. Lymphatic: There is no abdominopelvic adenopathy. No inguinal adenopathy. Reproductive: Status post hysterectomy. No adnexal masses. Other: Status post umbilical hernia. Stable from the prior exam. Diffuse mesenteric edema is again seen as noted previously. Small volume scab sat scattered areas of free fluid noted within the mesentery and along the peritoneal reflections. No focal fluid collections to suggest abscess. No pneumoperitoneum. Musculoskeletal: Degenerative disc disease. No acute or suspicious osseous findings. IMPRESSION: 1. No evidence for acute mesenteric ischemia. 2. Mild, approximately 20% stenosis at the origin of the left renal artery secondary to calcified plaque. 3. Persistent diffuse mesenteric edema with small volume of free fluid within the mesentery and along the peritoneal reflections. Findings are nonspecific. 4. No signs of abscess or pneumoperitoneum. 5. Unchanged small hiatal hernia. 6. Status post umbilical hernia repair. 7.  Aortic Atherosclerosis (ICD10-I70.0).  Electronically Signed   By: TKerby MoorsM.D.   On: 12/06/2022 14:29   CT ABDOMEN PELVIS WO CONTRAST  Result Date: 12/06/2022 CLINICAL DATA:  Lower abdominal pain. EXAM: CT ABDOMEN AND PELVIS WITHOUT CONTRAST TECHNIQUE: Multidetector CT imaging of the abdomen and pelvis was performed following the standard protocol without IV contrast. RADIATION DOSE REDUCTION: This exam was performed according to the departmental dose-optimization program which includes automated exposure control, adjustment of the mA and/or kV according to patient size and/or use of iterative reconstruction technique. COMPARISON:  CT abdomen pelvis dated October 27, 2022. FINDINGS: Lower chest: No acute abnormality. Hepatobiliary: No focal liver abnormality is seen. Status post cholecystectomy. No biliary dilatation. Pancreas: Atrophic. No ductal dilatation or surrounding inflammatory changes. Spleen: Normal in size without focal abnormality. Adrenals/Urinary Tract: Adrenal glands are unremarkable. Unchanged 5.3 cm right renal simple cyst. No follow-up imaging is recommended. No renal calculi or hydronephrosis. Small amount of excreted contrast in both renal collecting systems and the bladder. The bladder is otherwise unremarkable. Stomach/Bowel: Unchanged small hiatal hernia. The stomach is otherwise within normal limits. No bowel wall thickening, distention, or surrounding inflammatory changes. Severe sigmoid colonic diverticulosis. Vascular/Lymphatic: Aortic atherosclerosis. No enlarged  abdominal or pelvic lymph nodes. Reproductive: Status post hysterectomy. No adnexal masses. Other: Interval umbilical hernia repair without recurrent hernia. Slightly increased trace free fluid and mesenteric fat stranding in the pelvis. No pneumoperitoneum. Musculoskeletal: No acute or significant osseous findings. IMPRESSION: 1. No acute intra-abdominal process.  No bowel obstruction. 2. Slightly increased trace free fluid and mesenteric fat stranding in  the pelvis, nonspecific. 3. Interval umbilical hernia repair without recurrent hernia. 4.  Aortic Atherosclerosis (ICD10-I70.0). Electronically Signed   By: Titus Dubin M.D.   On: 12/06/2022 12:40   CUP PACEART REMOTE DEVICE CHECK  Result Date: 12/02/2022 Scheduled remote reviewed. Normal device function.  Next remote 91 days. LA     Subjective: Patient seen and examined at bedside.  Feels much better this morning and wants to go home today.  Denies any current abdominal pain, nausea or vomiting.  Daughter present at bedside.  Discharge Exam: Vitals:   12/15/22 1317 12/15/22 2041  BP: (!) 126/58 (!) 109/48  Pulse: 62 61  Resp: 18   Temp: 97.7 F (36.5 C) 97.8 F (36.6 C)  SpO2: 95% 94%    General: Pt is alert, awake, not in acute distress.  Elderly female lying in bed.  Currently on room air.  Foley catheter present. Cardiovascular: rate controlled, S1/S2 + Respiratory: bilateral decreased breath sounds at bases Abdominal: Soft, obese, NT, ND, bowel sounds + Extremities: Trace lower extremity edema, no cyanosis    The results of significant diagnostics from this hospitalization (including imaging, microbiology, ancillary and laboratory) are listed below for reference.     Microbiology: Recent Results (from the past 240 hour(s))  Urine Culture (for pregnant, neutropenic or urologic patients or patients with an indwelling urinary catheter)     Status: Abnormal   Collection Time: 12/10/22  1:29 PM   Specimen: Urine, Catheterized  Result Value Ref Range Status   Specimen Description   Final    URINE, CATHETERIZED Performed at [facility] [address], [place], [place] [address]    Special Requests   Final    NONE Performed at [facility], [place] [address], [place], [place] [address]    Culture (A)  Final    >=100,000 COLONIES/mL PSEUDOMONAS AERUGINOSA 60,000 COLONIES/mL KLEBSIELLA PNEUMONIAE    Report Status 12/14/2022 FINAL   Final   Organism ID, Bacteria PSEUDOMONAS AERUGINOSA (A)  Final   Organism ID, Bacteria KLEBSIELLA PNEUMONIAE (A)  Final      Susceptibility   Klebsiella pneumoniae - MIC*    AMPICILLIN RESISTANT Resistant     CEFAZOLIN <=4 SENSITIVE Sensitive     CEFEPIME <=0.12 SENSITIVE Sensitive     CEFTRIAXONE <=0.25 SENSITIVE Sensitive     CIPROFLOXACIN <=0.25 SENSITIVE Sensitive     GENTAMICIN <=1 SENSITIVE Sensitive     IMIPENEM <=0.25 SENSITIVE Sensitive     NITROFURANTOIN 64 INTERMEDIATE Intermediate     TRIMETH/SULFA <=20 SENSITIVE Sensitive     AMPICILLIN/SULBACTAM <=2 SENSITIVE Sensitive     PIP/TAZO <=4 SENSITIVE Sensitive     * 60,000 COLONIES/mL KLEBSIELLA PNEUMONIAE   Pseudomonas aeruginosa - MIC*    CEFTAZIDIME 2 SENSITIVE Sensitive     CIPROFLOXACIN 0.5 SENSITIVE Sensitive     GENTAMICIN <=1 SENSITIVE Sensitive     IMIPENEM 2 SENSITIVE Sensitive     PIP/TAZO <=4 SENSITIVE Sensitive     CEFEPIME 2 SENSITIVE Sensitive     * >=100,000 COLONIES/mL PSEUDOMONAS AERUGINOSA     Labs: BNP (last 3 results) Recent Labs  01/08/22 1554 01/22/22 1551 05/22/22 2011  BNP 810.1* 699.9* [id number]   Basic Metabolic Panel: Recent Labs  Lab 12/12/22 0631 12/13/22 0714 12/14/22 0643 12/15/22 0509 12/16/22 0549  NA 128* 123* 128* 129* 131*  K 4.8 3.9 4.2 4.3 4.0  CL 101 97* 99 102 102  CO2 20* 20* 21* 21* 22  GLUCOSE 129* 88 78 92 90  BUN '20 20 22 18 16  '$ CREATININE 0.98 0.95 1.10* 1.09* 0.99  CALCIUM 8.3* 7.9* 8.2* 7.9* 7.9*  MG 1.8 1.6* 1.6* 2.5* 1.9   Liver Function Tests: Recent Labs  Lab 12/09/22 1401 12/10/22 0555  AST 19 15  ALT 14 13  ALKPHOS 75 68  BILITOT 0.5 0.3  PROT 5.3* 5.0*  ALBUMIN 2.4* 2.3*   No results for input(s): "LIPASE", "AMYLASE" in the last 168 hours. No results for input(s): "AMMONIA" in the last 168 hours. CBC: Recent Labs  Lab 12/10/22 0555 12/12/22 0631  WBC 6.7 7.9  NEUTROABS 4.7 6.8  HGB 9.1* 9.4*  HCT 31.1* 29.5*  MCV 95.1 88.6   PLT 231 305   Cardiac Enzymes: No results for input(s): "CKTOTAL", "CKMB", "CKMBINDEX", "TROPONINI" in the last 168 hours. BNP: Invalid input(s): "POCBNP" CBG: No results for input(s): "GLUCAP" in the last 168 hours. D-Dimer No results for input(s): "DDIMER" in the last 72 hours. Hgb A1c No results for input(s): "HGBA1C" in the last 72 hours. Lipid Profile No results for input(s): "CHOL", "HDL", "LDLCALC", "TRIG", "CHOLHDL", "LDLDIRECT" in the last 72 hours. Thyroid function studies No results for input(s): "TSH", "T4TOTAL", "T3FREE", "THYROIDAB" in the last 72 hours.  Invalid input(s): "FREET3" Anemia work up No results for input(s): "VITAMINB12", "FOLATE", "FERRITIN", "TIBC", "IRON", "RETICCTPCT" in the last 72 hours. Urinalysis    Component Value Date/Time   COLORURINE YELLOW 12/10/2022 1329   APPEARANCEUR CLOUDY (A) 12/10/2022 1329   APPEARANCEUR Clear 01/08/2022 1554   LABSPEC 1.013 12/10/2022 1329   PHURINE 5.0 12/10/2022 1329   GLUCOSEU NEGATIVE 12/10/2022 1329   HGBUR SMALL (A) 12/10/2022 1329   BILIRUBINUR NEGATIVE 12/10/2022 1329   BILIRUBINUR Negative 01/08/2022 1554   KETONESUR NEGATIVE 12/10/2022 1329   PROTEINUR NEGATIVE 12/10/2022 1329   NITRITE NEGATIVE 12/10/2022 1329   LEUKOCYTESUR LARGE (A) 12/10/2022 1329   Sepsis Labs Recent Labs  Lab 12/10/22 0555 12/12/22 0631  WBC 6.7 7.9   Microbiology Recent Results (from the past 240 hour(s))  Urine Culture (for pregnant, neutropenic or urologic patients or patients with an indwelling urinary catheter)     Status: Abnormal   Collection Time: 12/10/22  1:29 PM   Specimen: Urine, Catheterized  Result Value Ref Range Status   Specimen Description   Final    URINE, CATHETERIZED Performed at [facility], [place] [address], [place], [place] [address]    Special Requests   Final    NONE Performed at [facility], [place] [address] [place], [place] [address]    Culture  (A)  Final    >=100,000 COLONIES/mL PSEUDOMONAS AERUGINOSA 60,000 COLONIES/mL KLEBSIELLA PNEUMONIAE    Report Status 12/14/2022 FINAL  Final   Organism ID, Bacteria PSEUDOMONAS AERUGINOSA (A)  Final   Organism ID, Bacteria KLEBSIELLA PNEUMONIAE (A)  Final      Susceptibility   Klebsiella pneumoniae - MIC*    AMPICILLIN RESISTANT Resistant     CEFAZOLIN <=4 SENSITIVE Sensitive     CEFEPIME <=0.12 SENSITIVE Sensitive     CEFTRIAXONE <=0.25 SENSITIVE Sensitive     CIPROFLOXACIN <=0.25 SENSITIVE Sensitive  GENTAMICIN <=1 SENSITIVE Sensitive     IMIPENEM <=0.25 SENSITIVE Sensitive     NITROFURANTOIN 64 INTERMEDIATE Intermediate     TRIMETH/SULFA <=20 SENSITIVE Sensitive     AMPICILLIN/SULBACTAM <=2 SENSITIVE Sensitive     PIP/TAZO <=4 SENSITIVE Sensitive     * 60,000 COLONIES/mL KLEBSIELLA PNEUMONIAE   Pseudomonas aeruginosa - MIC*    CEFTAZIDIME 2 SENSITIVE Sensitive     CIPROFLOXACIN 0.5 SENSITIVE Sensitive     GENTAMICIN <=1 SENSITIVE Sensitive     IMIPENEM 2 SENSITIVE Sensitive     PIP/TAZO <=4 SENSITIVE Sensitive     CEFEPIME 2 SENSITIVE Sensitive     * >=100,000 COLONIES/mL PSEUDOMONAS AERUGINOSA     Time coordinating discharge: 35 minutes  SIGNED:   Aline August, MD  [facility] 12/16/2022, 11:08 AM

## 2022-12-24 ENCOUNTER — Encounter (HOSPITAL_BASED_OUTPATIENT_CLINIC_OR_DEPARTMENT_OTHER): Payer: Self-pay | Admitting: Cardiovascular Disease

## 2022-12-24 ENCOUNTER — Encounter: Payer: Self-pay | Admitting: Oncology

## 2022-12-25 NOTE — Telephone Encounter (Signed)
Please review and advise if patient needs to keep upcoming appointment?

## 2022-12-29 ENCOUNTER — Other Ambulatory Visit: Payer: Medicare HMO

## 2022-12-29 ENCOUNTER — Inpatient Hospital Stay: Payer: Medicare HMO | Attending: Oncology | Admitting: Oncology

## 2022-12-29 ENCOUNTER — Ambulatory Visit: Payer: Medicare HMO | Admitting: Oncology

## 2022-12-29 ENCOUNTER — Telehealth: Payer: Self-pay | Admitting: Genetic Counselor

## 2022-12-29 ENCOUNTER — Encounter: Payer: Self-pay | Admitting: *Deleted

## 2022-12-29 DIAGNOSIS — C799 Secondary malignant neoplasm of unspecified site: Secondary | ICD-10-CM | POA: Diagnosis not present

## 2022-12-29 NOTE — Progress Notes (Addendum)
[facility] OFFICE VISIT PROGRESS NOTE  I connected with [name] on 12/29/22 at 11:20 AM EDT by telephone and verified that I am speaking with the correct person using two identifiers.    I discussed the limitations, risks, security and privacy concerns of performing an evaluation and management service by telemedicine and the availability of in-person appointments. I also discus home sed with the patient that there may be a patient responsible charge related to this service. The patient expressed understanding and agreed to proceed.  Other persons participating in the visit and their role in the encounter: Daughter, hospice RN  Patient's location: Home Provider's location: Office    Diagnosis: Metastatic adenocarcinoma  INTERVAL HISTORY:   [name] was discharged in the hospital on 12/16/2022.  She has enrolled in home hospice care.  She is seen today for a telehealth visit per her request.  The hospice nurse is visiting at present.  Her pain is under adequate control with Duragesic and hydrocodone.  He has discontinued several medications including vitamin B12, thiamine, and Tessalon.  She had a bowel movement after taking lactulose. [name] is in bed and a chair most of the time.     Lab Results:  Lab Results  Component Value Date   WBC 7.9 12/12/2022   HGB 9.4 (L) 12/12/2022   HCT 29.5 (L) 12/12/2022   MCV 88.6 12/12/2022   PLT 305 12/12/2022   NEUTROABS 6.8 12/12/2022    Medications: I have reviewed the patient's current medications.  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 Umbilical hernia sac excision and omental biopsy 10/30/2022-moderately differentiated adenocarcinoma, AE1/AE3, CK7, PAX8, WT1, and ER positive Foundation 1-FGFR1 amplification, HRD-not detected, MSS, tumor mutation burden 1 CT abdomen/pelvis 10/07/2022-small to moderate fat-containing  umbilical hernia with associated stranding of the contained fat, nonspecific scattered misty mesentery of the small bowel CT abdomen/pelvis 10/27/2022-moderate fat-containing periumbilical hernia with persistent increased density, small volume free fluid and mesenteric stranding CT chest 11/03/2022-no evidence of metastatic disease, mild focal groundglass opacity in the left upper lobe likely infectious or inflammatory, small bilateral pleural effusions and bibasilar atelectasis Mild elevation of the CA125 Abdominal pain-progressive History of congestive heart failure History of atrial fibrillation, right bundle branch block, 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  No primary tumor site has been defined.  She was admitted to the hospital earlier this month with increased abdominal pain.  She decided to enroll in home hospice care.  She is taking tamoxifen.  [name] reports adequate pain control with the current narcotic regimen.  She will continue Senokot and as needed lactulose.  She would like to undergo genetic testing.  We will try to arrange for for this via the genetics counselor.  [name] would like to schedule a follow-up telehealth visit in 3-4 weeks.   I discussed the assessment and treatment plan with the patient. The patient was provided an opportunity to ask questions and all were answered. The patient agreed with the plan and demonstrated an understanding of the instructions.   The patient was advised to call back or seek an in-person evaluation if the symptoms worsen or if the condition fails to improve as anticipated.  I provided 20 minutes of chart review, telephone, and documentation time during this encounter, and > 50% was spent counseling as documented under my  assessment & plan.  Betsy Coder ANP/GNP-BC    12/29/2022 12:04 PM

## 2022-12-29 NOTE — Progress Notes (Signed)
Confirmed w/Sam, RN at [facility] that Dr. Jenene Slicker is following the urine culture results. Lab report sent to office as FYI.  Family is also wanting patient to have genetics testing. Sent message to Loralyn Freshwater in genetics to change her appointment to telephone visit w/daughter and could try to arrange for Hospice RN to pick up a kit to draw blood at home and return.

## 2022-12-29 NOTE — Telephone Encounter (Signed)
Called [name] to switch genetics to telephone visit given mother is in Hospice.  Offered to move up genetics appt to this week.  LVM with contact information requesting call back.   Alsace Dowd M. Joette Catching, [place], [facility] Genetic Counselor [email or web address] (P) [phone]

## 2022-12-30 ENCOUNTER — Inpatient Hospital Stay: Payer: Self-pay | Attending: Oncology | Admitting: Genetic Counselor

## 2022-12-30 ENCOUNTER — Telehealth: Payer: Self-pay | Admitting: Genetic Counselor

## 2022-12-30 DIAGNOSIS — C799 Secondary malignant neoplasm of unspecified site: Secondary | ICD-10-CM

## 2022-12-30 NOTE — Telephone Encounter (Signed)
Called [name], explained genetic testing, and took brief family history.  Explained blood or saliva sample options.  Discussed contacting Hospice nurse Evie Lacks to see if she can draw blood.  Mcarthur Rossetti at [phone] to discuss coordinating blood draw.  Per Oris Drone will be back to see her mom next week.

## 2023-01-08 DIAGNOSIS — C569 Malignant neoplasm of unspecified ovary: Secondary | ICD-10-CM | POA: Diagnosis not present

## 2023-01-12 ENCOUNTER — Inpatient Hospital Stay: Payer: Medicare HMO | Admitting: Genetic Counselor

## 2023-01-12 ENCOUNTER — Other Ambulatory Visit (HOSPITAL_BASED_OUTPATIENT_CLINIC_OR_DEPARTMENT_OTHER): Payer: Self-pay | Admitting: Cardiovascular Disease

## 2023-01-12 ENCOUNTER — Inpatient Hospital Stay: Payer: Medicare HMO

## 2023-01-12 DIAGNOSIS — I5022 Chronic systolic (congestive) heart failure: Secondary | ICD-10-CM

## 2023-01-12 NOTE — Progress Notes (Signed)
Remote pacemaker transmission.   

## 2023-01-12 NOTE — Telephone Encounter (Signed)
Rx request sent to pharmacy.  

## 2023-01-22 ENCOUNTER — Telehealth: Payer: Self-pay | Admitting: Cardiovascular Disease

## 2023-01-22 NOTE — Telephone Encounter (Signed)
Patient's daughter stated that the patient is now in hospice and was unsure if we would still do home remote checks for her pacer. Patient's daughter stated that the patient is still taking all of her medications that we prescribe to her.

## 2023-01-22 NOTE — Telephone Encounter (Signed)
Kem, do we do remotes for hospice? I can't recall.

## 2023-01-22 NOTE — Telephone Encounter (Signed)
Please advise 

## 2023-01-23 ENCOUNTER — Ambulatory Visit: Payer: Self-pay | Admitting: Genetic Counselor

## 2023-01-23 ENCOUNTER — Telehealth: Payer: Self-pay | Admitting: *Deleted

## 2023-01-23 ENCOUNTER — Encounter: Payer: Self-pay | Admitting: Genetic Counselor

## 2023-01-23 ENCOUNTER — Telehealth: Payer: Self-pay | Admitting: Genetic Counselor

## 2023-01-23 DIAGNOSIS — C799 Secondary malignant neoplasm of unspecified site: Secondary | ICD-10-CM

## 2023-01-23 DIAGNOSIS — C569 Malignant neoplasm of unspecified ovary: Secondary | ICD-10-CM | POA: Insufficient documentation

## 2023-01-23 DIAGNOSIS — Z1379 Encounter for other screening for genetic and chromosomal anomalies: Secondary | ICD-10-CM | POA: Insufficient documentation

## 2023-01-23 NOTE — Progress Notes (Signed)
REFERRING PROVIDER: Ladene Artist, MD [address] [place],  [place] [address]  PRIMARY PROVIDER:  Linus Galas, NP  PRIMARY REASON FOR VISIT:  Encounter Diagnosis  Name Primary?   Metastatic adenocarcinoma of unknown origin (HCC) Yes      HISTORY OF PRESENT ILLNESS:   [name], a 89 y.o. female, was seen for a [facility] cancer genetics consultation at the request of Dr. Truett Perna due to a personal history of ovarian cancer.  [name] presents to clinic today to discuss the possibility of a hereditary predisposition to cancer, to discuss genetic testing, and to further clarify her future cancer risks, as well as potential cancer risks for family members.   In 2024, at the age of 36, [name] was diagnosed with metastatic adenocarcinoma involving an umbilical hernia sac and omentem with mild elevation of CA-125---possible ovarian or primary peritoneal.      Past Medical History:  Diagnosis Date   Chronic combined systolic and diastolic heart failure 08/26/2022   Dyspnea    diastolic dysfunction by echo 2012   GERD (gastroesophageal reflux disease)    Hypertension    Mild mitral and aortic regurgitation    Osteoarthritis    Presence of permanent cardiac pacemaker    Right bundle branch block 10/06/2012   Thyroid disease     Past Surgical History:  Procedure Laterality Date   arm surgery     following an accident   CARDIOVERSION N/A 12/11/2021   Procedure: CARDIOVERSION;  Surgeon: Christell Constant, MD;  Location: [facility] ENDOSCOPY;  Service: Cardiovascular;  Laterality: N/A;   CARDIOVERSION N/A 05/22/2022   Procedure: CARDIOVERSION;  Surgeon: Chilton Si, MD;  Location: [facility] ENDOSCOPY;  Service: Cardiovascular;  Laterality: N/A;   CATARACT EXTRACTION, BILATERAL  2018   CHOLECYSTECTOMY N/A 10/30/2022   Procedure: LAPAROSCOPIC CHOLECYSTECTOMY;  Surgeon: Berna Bue, MD;  Location: [facility] ORS;  Service: General;  Laterality: N/A;   EYE SURGERY      KNEE SURGERY     Following an accident   PACEMAKER IMPLANT N/A 05/27/2022   Procedure: PACEMAKER IMPLANT;  Surgeon: Regan Lemming, MD;  Location: [facility] INVASIVE CV LAB;  Service: Cardiovascular;  Laterality: N/A;   TEE WITHOUT CARDIOVERSION N/A 12/11/2021   Procedure: TRANSESOPHAGEAL ECHOCARDIOGRAM (TEE);  Surgeon: Christell Constant, MD;  Location: [facility] ENDOSCOPY;  Service: Cardiovascular;  Laterality: N/A;   TONSILLECTOMY AND ADENOIDECTOMY     UMBILICAL HERNIA REPAIR N/A 10/30/2022   Procedure: UMBILICAL HERNIA REPAIR;  Surgeon: Berna Bue, MD;  Location: [facility] ORS;  Service: General;  Laterality: N/A;   VAGINAL HYSTERECTOMY      FAMILY HISTORY:  We obtained a detailed, 4-generation family history.  Significant diagnoses are listed below: Family History  Problem Relation Age of Onset   Kidney cancer Mother      [name] is unaware of previous family history of genetic testing for hereditary cancer risks.There is no reported Ashkenazi Jewish ancestry. There is no known consanguinity.  GENETIC COUNSELING ASSESSMENT: [name] is a 89 y.o. female with a personal history which is somewhat suggestive of a hereditary cancer syndrome (possible ovarian cancer primary). We, therefore, discussed and recommended the following at today's visit.   DISCUSSION: We discussed that 5 - 10% of cancer is hereditary.  Most cases of hereditary ovarian cancer are associated with mutations in BRCA1/2.  There are other genes that can be associated with hereditary ovarian cancer syndromes.  We discussed that testing is beneficial for several reasons including understanding if  other family members could be at risk for cancer and allowing them to undergo genetic testing.   We reviewed the characteristics, features and inheritance patterns of hereditary cancer syndromes. We also discussed genetic testing, including the appropriate family members to test, the process of testing, insurance coverage and  turn-around-time for results. We discussed the implications of a negative, positive, carrier and/or variant of uncertain significant result. We recommended [name] pursue genetic testing for a panel that includes genes associated with ovarian cancer and other cancers.   The CancerNext-Expanded gene panel offered by [facility] and includes sequencing, rearrangement, and RNA analysis for the following 71 genes:  AIP, ALK, APC, ATM, BAP1, BARD1, BMPR1A, BRCA1, BRCA2, BRIP1, CDC73, CDH1, CDK4, CDKN1B, CDKN2A, CHEK2, DICER1, FH, FLCN, KIF1B, LZTR1,MAX, MEN1, MET, MLH1, MSH2, MSH6, MUTYH, NF1, NF2, NTHL1, PALB2, PHOX2B, PMS2, POT1, PRKAR1A, PTCH1, PTEN, RAD51C,RAD51D, RB1, RET, SDHA, SDHAF2, SDHB, SDHC, SDHD, SMAD4, SMARCA4, SMARCB1, SMARCE1, STK11, SUFU, TMEM127, TP53,TSC1, TSC2 and VHL (sequencing and deletion/duplication); AXIN2, CTNNA1, EGFR, EGLN1, HOXB13, KIT, MITF, MSH3, PDGFRA, POLD1 and POLE (sequencing only); EPCAM and GREM1 (deletion/duplication only).   Based on [name]'s personal history of cancer, she meets medical criteria for genetic testing. We discussed with her daughter that there could be an out of pocket cost for testing.   PLAN: After considering the risks, benefits, and limitations, [name] provided informed consent to pursue genetic testing.  Blood sample scheduled to be drawn by hospice nurse with plans to send to [facility] for analysis of the CancerNext-Expanded +RNA Panel. Results should be available within approximately 3 weeks' time, at which point they will be disclosed by telephone to [name], as will any additional recommendations warranted by these results. [name] will receive a summary of her genetic counseling visit and a copy of her results once available. This information will also be available in Epic.   [name] questions were answered to her satisfaction today. Our contact information was provided should additional questions or concerns  arise. Thank you for the referral and allowing [place] to share in the care of your patient.   Dong Nimmons M. Rennie Plowman, MS, [facility] Genetic Counselor [email or web address] (P) [phone]  The patient was seen for less than 15 minutes in phone-only genetic counseling.

## 2023-01-23 NOTE — Telephone Encounter (Signed)
Call from hospice RN asking if patient can d/c her carafate. OK per Dr. Truett Perna.

## 2023-01-23 NOTE — Progress Notes (Signed)
HPI:   [name] was previously seen in the [facility] clinic due to a personal history of metastatic adenocarcinoma of unknown primary (possible ovarian cancer primary) and concerns regarding a hereditary predisposition to cancer. Please refer to our prior cancer genetics clinic note for more information regarding our discussion, assessment and recommendations, at the time. [name] recent genetic test results were disclosed to her [name], as were recommendations warranted by these results. These results and recommendations are discussed in more detail below.  CANCER HISTORY:  Oncology History  Metastatic adenocarcinoma of unknown origin  12/06/2022 Initial Diagnosis   Metastatic adenocarcinoma of unknown origin   01/19/2023 Genetic Testing   Negative [facility] CancerNext-Expanded +RNAinsight Panel.  Report dateis 01/19/2023.   The CancerNext-Expanded gene panel offered by [facility] and includes sequencing, rearrangement, and RNA analysis for the following 71 genes:  AIP, ALK, APC, ATM, BAP1, BARD1, BMPR1A, BRCA1, BRCA2, BRIP1, CDC73, CDH1, CDK4, CDKN1B, CDKN2A, CHEK2, DICER1, FH, FLCN, KIF1B, LZTR1,MAX, MEN1, MET, MLH1, MSH2, MSH6, MUTYH, NF1, NF2, NTHL1, PALB2, PHOX2B, PMS2, POT1, PRKAR1A, PTCH1, PTEN, RAD51C,RAD51D, RB1, RET, SDHA, SDHAF2, SDHB, SDHC, SDHD, SMAD4, SMARCA4, SMARCB1, SMARCE1, STK11, SUFU, TMEM127, TP53,TSC1, TSC2 and VHL (sequencing and deletion/duplication); AXIN2, CTNNA1, EGFR, EGLN1, HOXB13, KIT, MITF, MSH3, PDGFRA, POLD1 and POLE (sequencing only); EPCAM and GREM1 (deletion/duplication only).      FAMILY HISTORY:  We obtained a detailed, 4-generation family history.  Significant diagnoses are listed below:      Family History  Problem Relation Age of Onset   Kidney cancer Mother         [name] is unaware of previous family history of genetic testing for hereditary cancer risks.There is no reported Ashkenazi Jewish ancestry. There is no known  consanguinity.    GENETIC TEST RESULTS:  The [facility] CancerNext-Expanded +RNAinsight Panel found no pathogenic mutations. The CancerNext-Expanded gene panel offered by [facility] and includes sequencing, rearrangement, and RNA analysis for the following 71 genes:  AIP, ALK, APC, ATM, BAP1, BARD1, BMPR1A, BRCA1, BRCA2, BRIP1, CDC73, CDH1, CDK4, CDKN1B, CDKN2A, CHEK2, DICER1, FH, FLCN, KIF1B, LZTR1,MAX, MEN1, MET, MLH1, MSH2, MSH6, MUTYH, NF1, NF2, NTHL1, PALB2, PHOX2B, PMS2, POT1, PRKAR1A, PTCH1, PTEN, RAD51C,RAD51D, RB1, RET, SDHA, SDHAF2, SDHB, SDHC, SDHD, SMAD4, SMARCA4, SMARCB1, SMARCE1, STK11, SUFU, TMEM127, TP53,TSC1, TSC2 and VHL (sequencing and deletion/duplication); AXIN2, CTNNA1, EGFR, EGLN1, HOXB13, KIT, MITF, MSH3, PDGFRA, POLD1 and POLE (sequencing only); EPCAM and GREM1 (deletion/duplication only).    The test report has been scanned into EPIC and is located under the Molecular Pathology section of the Results Review tab.  A portion of the result report is included below for reference. Genetic testing reported out on January 19, 2023.      Even though a pathogenic variant was not identified, possible explanations for the cancer in the family may include: There may be no hereditary risk for cancer in the family. The cancers in [name] and/or her family may be sporadic/familial or due to other genetic and environmental factors. There may be a gene mutation in one of these genes that current testing methods cannot detect but that chance is small. There could be another gene that has not yet been discovered, or that we have not yet tested, that is responsible for the cancer diagnoses in the family.    Therefore, it is important to remain in touch with cancer genetics in the future so that we can continue to offer [name]'s family the most up to date genetic testing.  ADDITIONAL GENETIC TESTING:  We discussed with [name] that her genetic testing was fairly  extensive.  If there are additional relevant genes identified to increase cancer risk that can be analyzed in the future, we would be happy to discuss and coordinate this testing at that time.    CANCER SCREENING RECOMMENDATIONS:  [name] test result is considered negative (normal).  This means that we have not identified a hereditary cause for her personal history of metastatic adenocarcinoma at this time.   An individual's cancer risk and medical management are not determined by genetic test results alone. Overall cancer risk assessment incorporates additional factors, including personal medical history, family history, and any available genetic information that may result in a personalized plan for cancer prevention and surveillance.    RECOMMENDATIONS FOR FAMILY MEMBERS:   Since she did not inherit a identifiable mutation in a cancer predisposition gene included on this panel, her children could not have inherited a known mutation from her in one of these genes. Individuals in this family might be at some increased risk of developing cancer, over the general population risk, due to the family history of cancer.  Individuals in the family should notify their providers of the family history of cancer. We recommend women in this family have a yearly mammogram beginning at age 68, or 35 years younger than the earliest onset of cancer, an annual clinical breast exam, and perform monthly breast self-exams.  Family members should have colonoscopies by at age 34, or earlier, as recommended by their providers.  FOLLOW-UP:  Lastly, we discussed with [name] that cancer genetics is a rapidly advancing field and it is possible that new genetic tests will be appropriate for her and/or her family members in the future. We encouraged her to remain in contact with cancer genetics on an annual basis so we can update her personal and family histories and let her know of advances in cancer genetics that may  benefit this family.   Our contact number was provided. [name] questions were answered to her satisfaction, and she knows she is welcome to call us at anytime with additional questions or concerns.   Carmello Cabiness M. Rennie Plowman, MS, [facility] Genetic Counselor [email or web address] (P) [phone]

## 2023-01-23 NOTE — Telephone Encounter (Signed)
Disclosed negative genetics to daughter Efraim Kaufmann.

## 2023-01-29 ENCOUNTER — Encounter: Payer: Self-pay | Admitting: *Deleted

## 2023-01-29 NOTE — Progress Notes (Signed)
Received urinalysis results suspicious of UTI. Confirmed w/[facility] that she was started on Bactrim bid x 5 days on 01/27/23.

## 2023-01-30 NOTE — Telephone Encounter (Signed)
We can. Its patient specific. Some wants to but others dont. It all depends on what Hospice will pay for.

## 2023-01-30 NOTE — Telephone Encounter (Signed)
Returned call to daughter.  Advised that it is this nurses understanding that hospice will cover cost of remote monitoring.  Advised if family were to get a bill, we would be happy to no charge her remote monitoring as she is unable to leave the home.  Daughter thanked nurse for call back.

## 2023-02-02 ENCOUNTER — Encounter: Payer: Self-pay | Admitting: *Deleted

## 2023-02-02 NOTE — Progress Notes (Signed)
Received urine culture/sensitivity results from [facility]. Called and confirmed w/[facility] that on 4/25 she was changed to Cipro 500 mg bid x 5 days.

## 2023-02-04 ENCOUNTER — Inpatient Hospital Stay: Payer: Medicare HMO | Admitting: Oncology

## 2023-02-09 NOTE — Telephone Encounter (Signed)
Patient in hospice

## 2023-02-13 ENCOUNTER — Telehealth: Payer: Self-pay

## 2023-02-13 ENCOUNTER — Encounter: Payer: Self-pay | Admitting: *Deleted

## 2023-02-17 ENCOUNTER — Encounter: Payer: Self-pay | Admitting: Oncology

## 2023-02-18 ENCOUNTER — Ambulatory Visit (HOSPITAL_BASED_OUTPATIENT_CLINIC_OR_DEPARTMENT_OTHER): Payer: Medicare HMO | Admitting: Cardiovascular Disease

## 2023-03-03 ENCOUNTER — Ambulatory Visit: Payer: Medicare HMO

## 2023-03-07 NOTE — Telephone Encounter (Signed)
Pt daughter called stating she passed away this morning. I marked her deceased in [place] and took her out of [facility]. I also sent her a return kit for the monitor. I let her know that I will let Dr. Elberta Fortis know.

## 2023-03-07 NOTE — Progress Notes (Signed)
Received fax from [facility] that patient died 2023-02-18 at 0524. MD notified.

## 2023-03-07 DEATH — deceased

## 2023-05-22 IMAGING — DX DG CHEST 2V
2 series · 2 of 2 positions shown · non-contrast
Comparison: 12/12/2021 chest radiograph.

CLINICAL DATA: Dyspnea

EXAM:
CHEST - 2 VIEW

[chest lat]
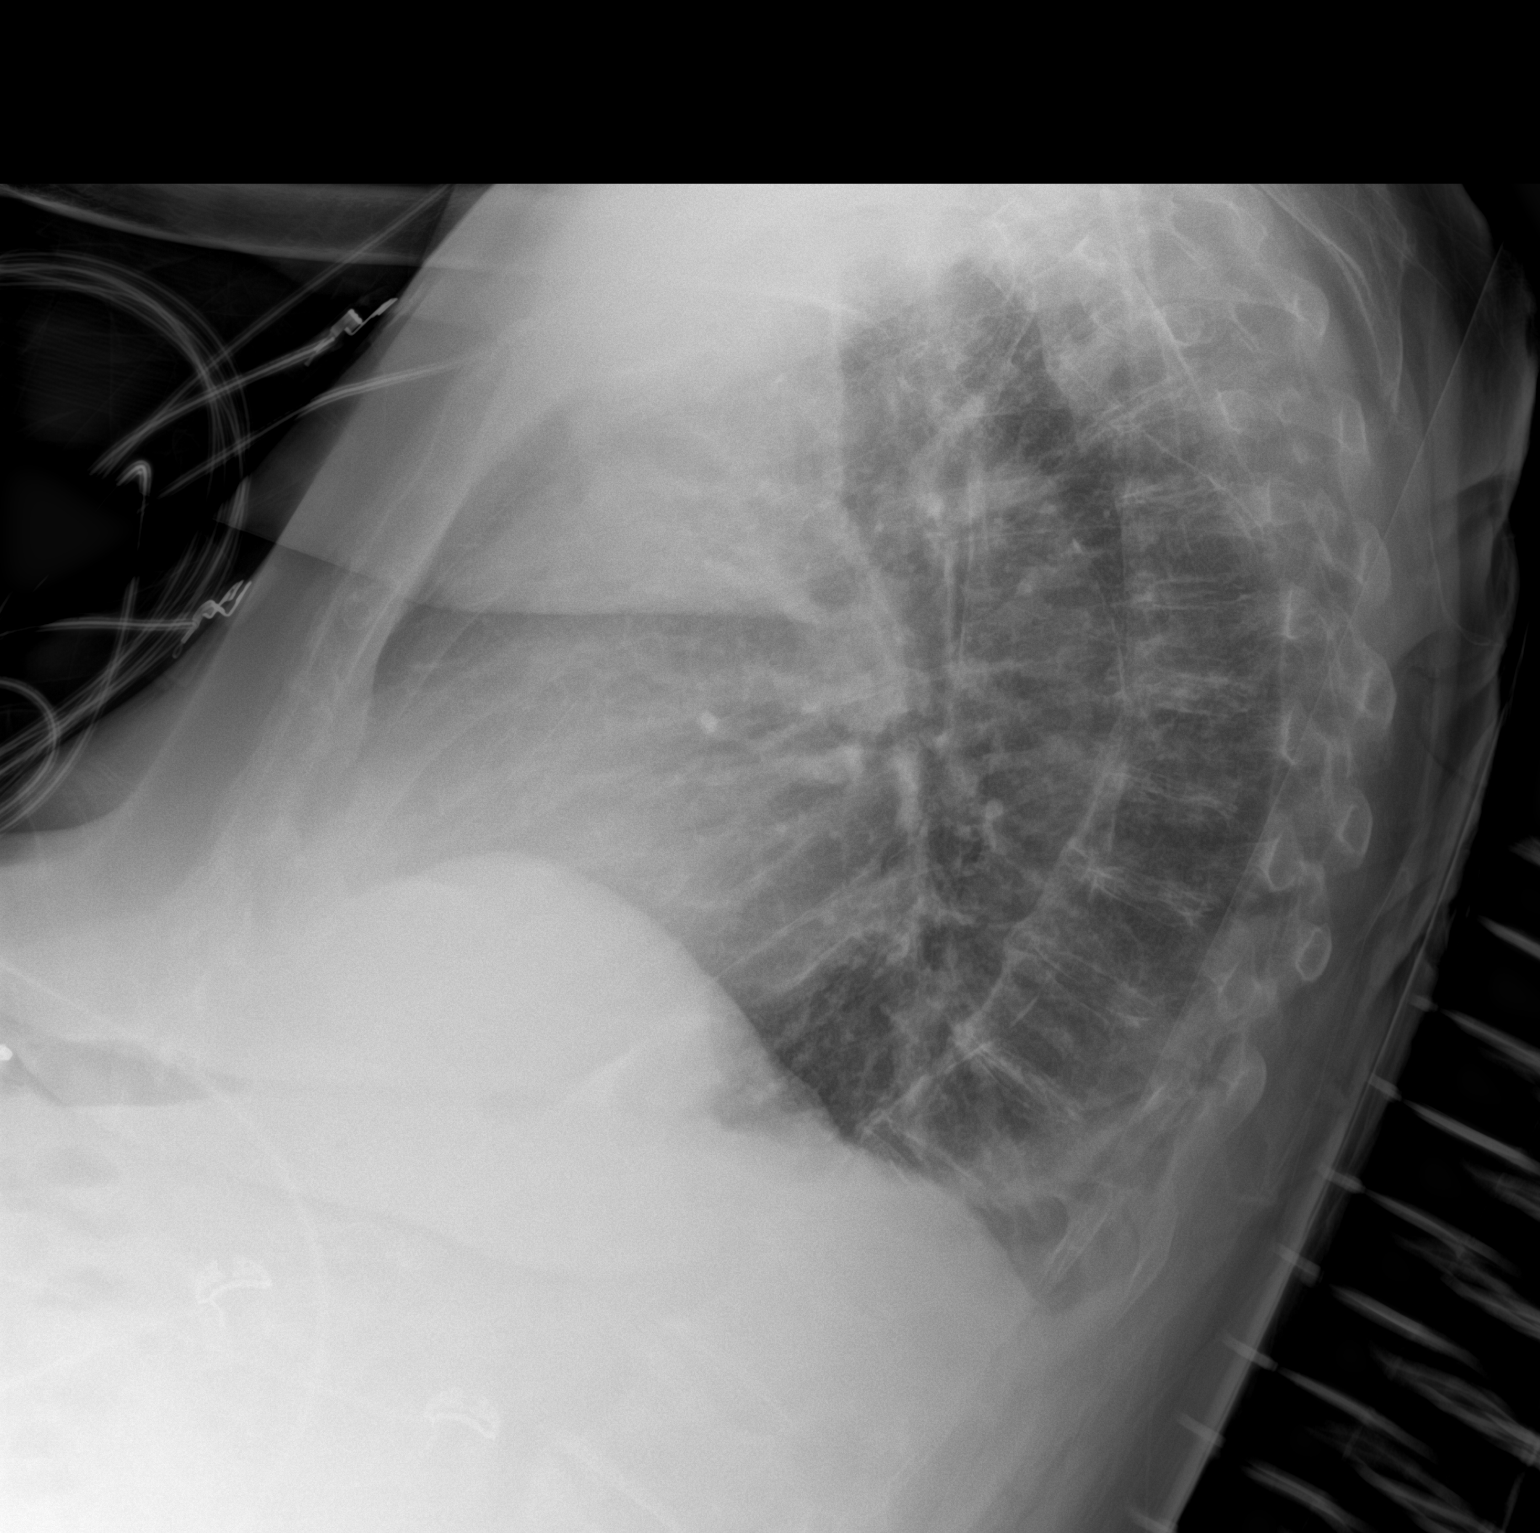

[chest ap]
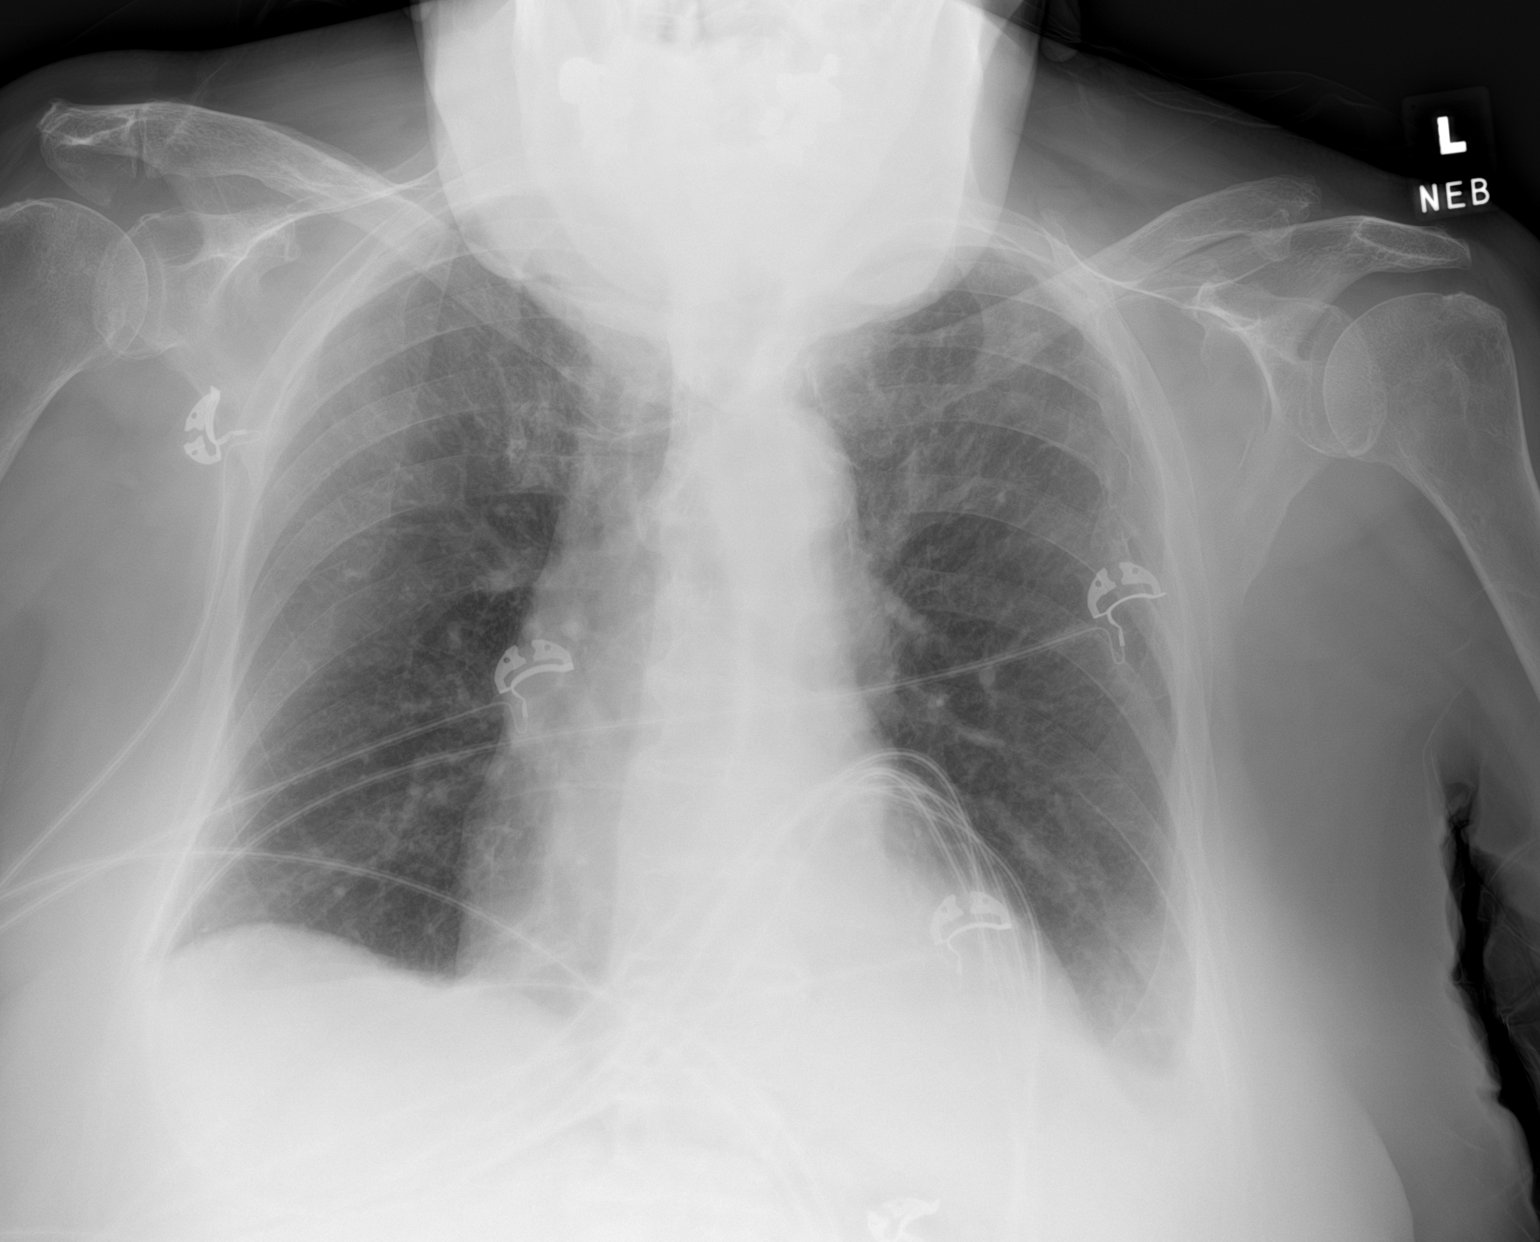

[2 of 2 positions shown; findings below may reference images not displayed]

FINDINGS: Stable cardiomediastinal silhouette with normal heart size. No
pneumothorax. Small left pleural effusion and trace right pleural
effusion, slightly decreased. No overt pulmonary edema. Hazy left
retrocardiac opacity, similar.
IMPRESSION: 1. Small left and trace right pleural effusions, slightly decreased.
2. Stable hazy left retrocardiac opacity, favor left lower lobe
atelectasis.

## 2023-05-22 IMAGING — CT CT HEAD W/O CM
4 series · 16 of 47 positions shown, 18 images · non-contrast
Comparison: Remote head CT [DATE]

CLINICAL DATA: Mental status change, unknown cause



[Series 2: head wo · axial · 0.43mm/px · z∈[-657,-542]mm · 7 of 31 slices shown, 9 images]
[im 4/31  brain]
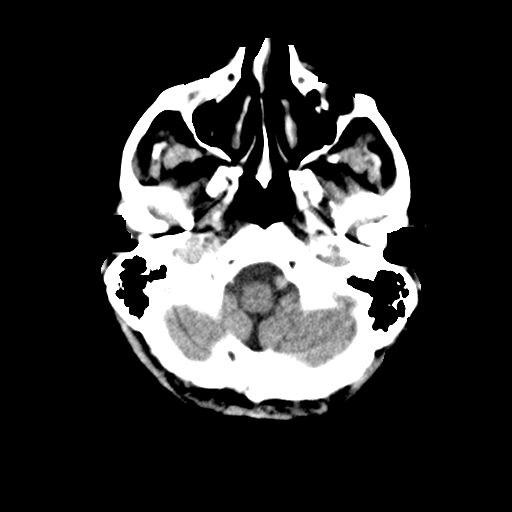
[im 4/31  bone]
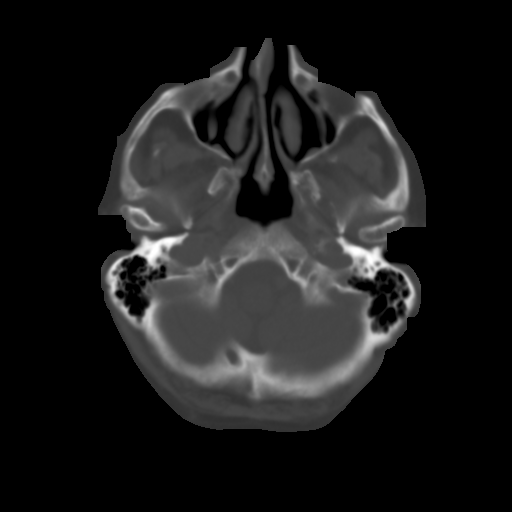
[im 8/31  brain]
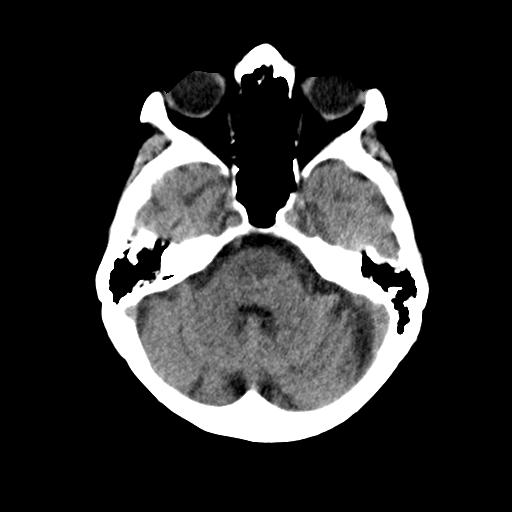
[im 12/31  brain]
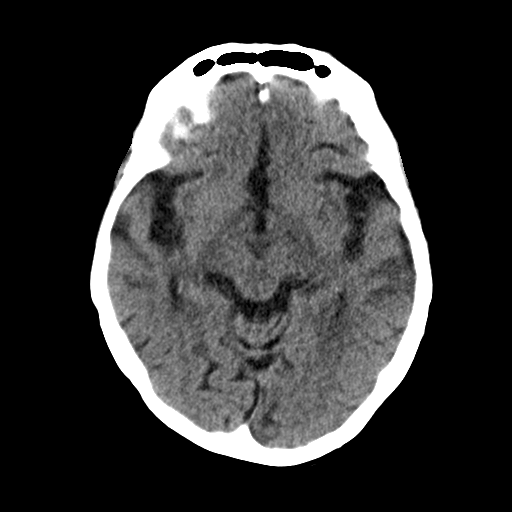
[im 16/31  brain]
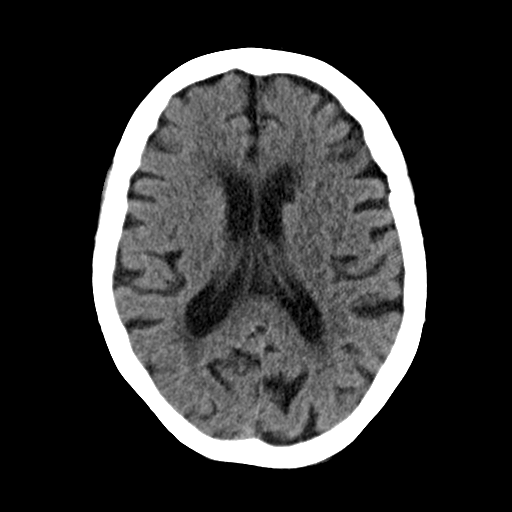
[im 19/31  brain]
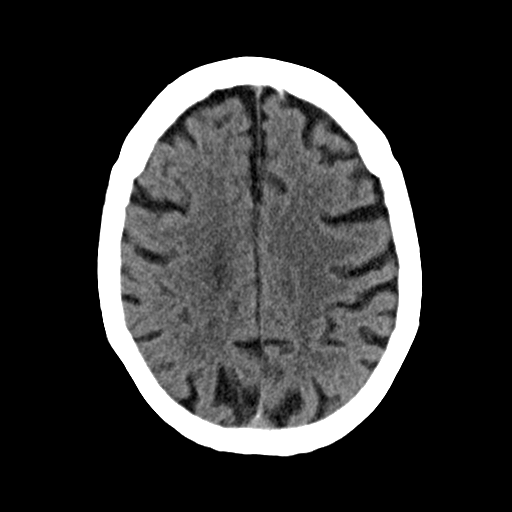
[im 19/31  bone]
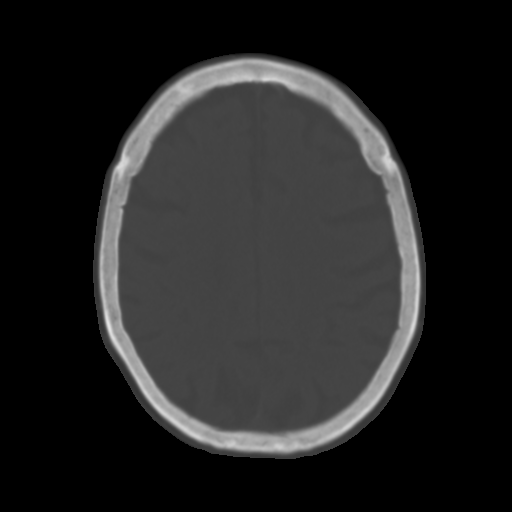
[im 23/31  brain]
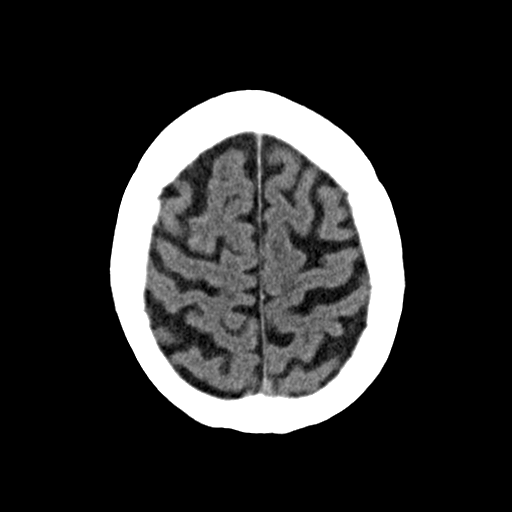
[im 27/31  brain]
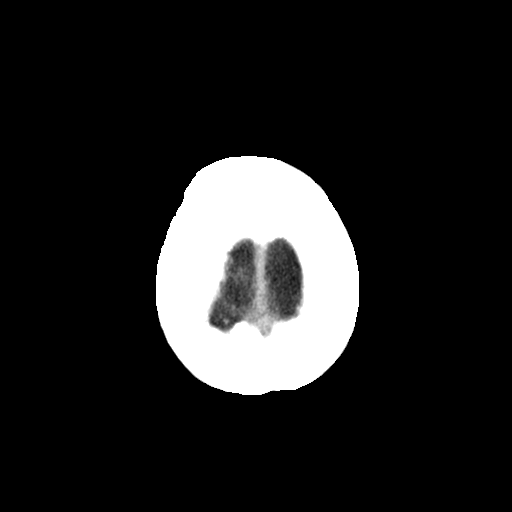

[Series 3: head bone · axial · 0.43mm/px · z∈[-658,-628]mm · 3 of 77 slices shown]
[im 8/77  bone]
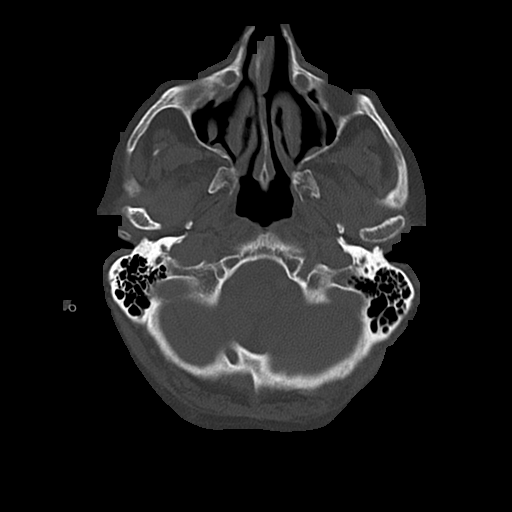
[im 16/77  bone]
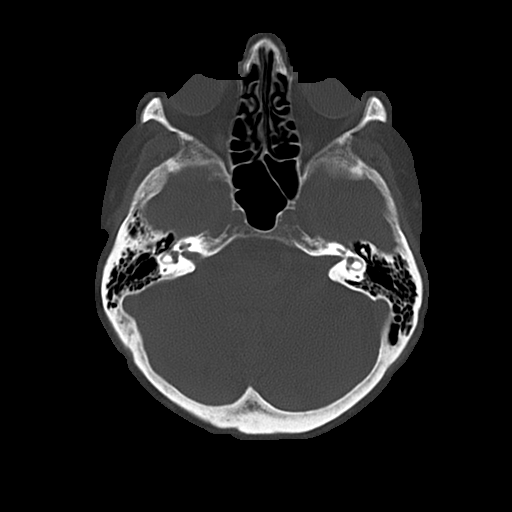
[im 23/77  bone]
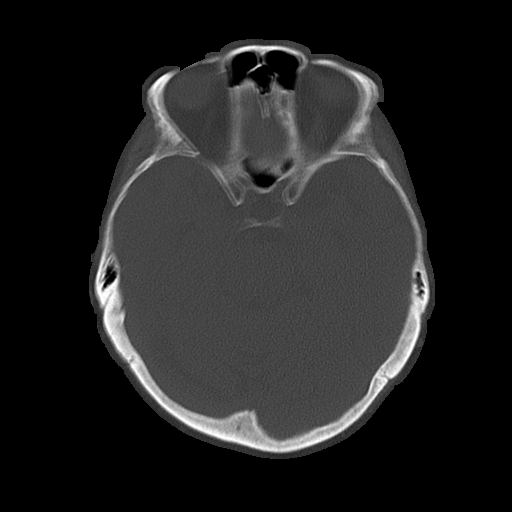

[Series 4: coronal soft · coronal · 0.31mm/px · 3 of 68 slices shown]
[im 23/68  brain]
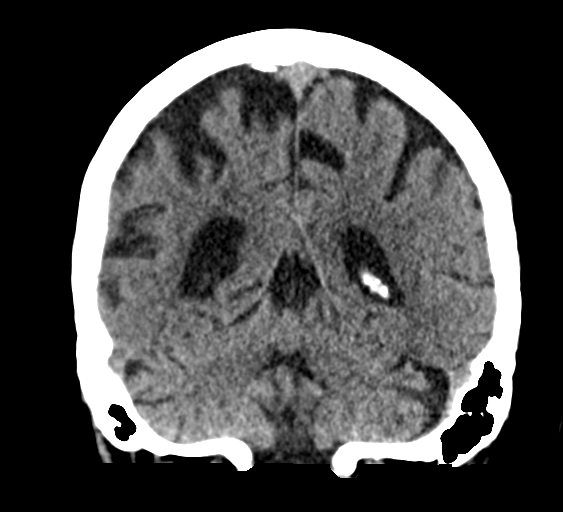
[im 30/68  brain]
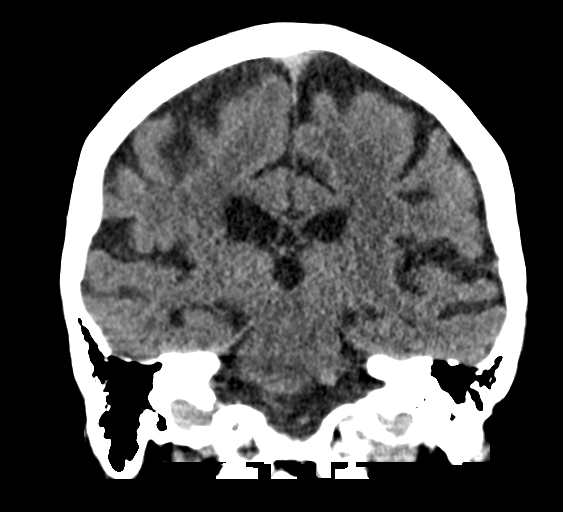
[im 38/68  brain]
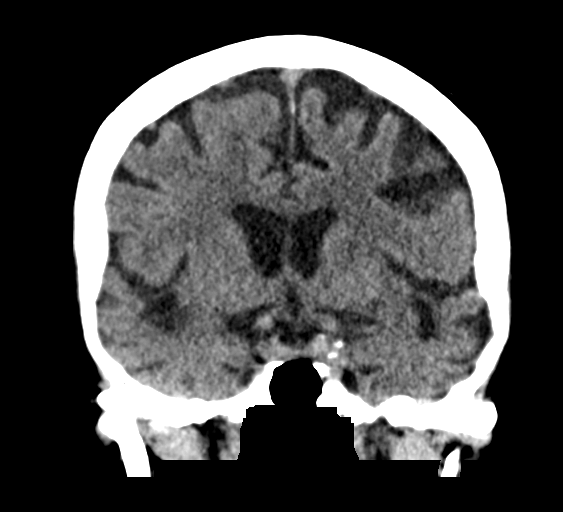

[Series 5: sagittal soft · sagittal · 0.30mm/px · 3 of 60 slices shown]
[im 20/60  brain]
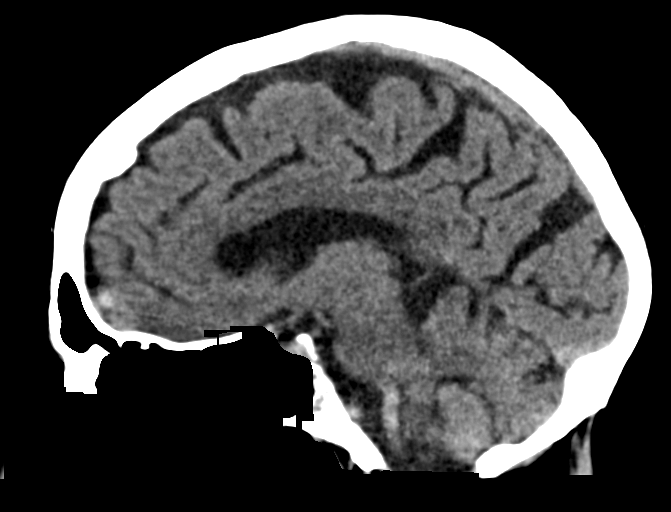
[im 30/60  brain]
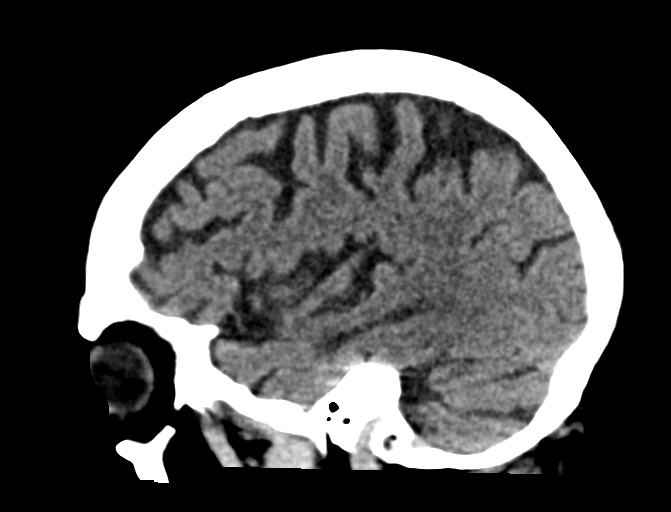
[im 40/60  brain]
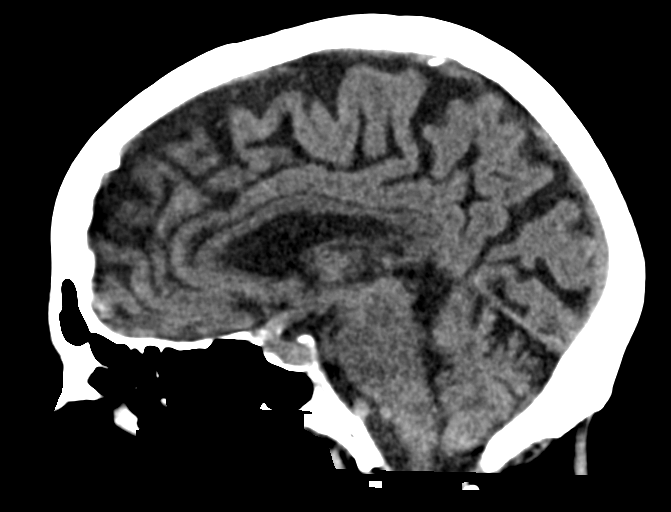

[16 of 47 positions shown; findings below may reference images not displayed]

FINDINGS: Brain: Age related atrophy. No intracranial hemorrhage, mass effect,
or midline shift. No hydrocephalus. The basilar cisterns are patent.
Mild to moderate periventricular chronic small vessel ischemia. No
evidence of territorial infarct or acute ischemia. No extra-axial or
intracranial fluid collection.

Vascular: Atherosclerosis of skullbase vasculature without
hyperdense vessel or abnormal calcification.

Skull: No fracture or focal lesion.

Sinuses/Orbits: Bilateral cataract resection.  No acute findings.

Other: None.
IMPRESSION: 1. No acute intracranial abnormality.
2. Age related atrophy and chronic small vessel ischemia.

## 2023-06-02 ENCOUNTER — Ambulatory Visit: Payer: Medicare HMO

## 2023-09-01 ENCOUNTER — Ambulatory Visit: Payer: Medicare HMO

## 2023-12-01 ENCOUNTER — Ambulatory Visit: Payer: Medicare HMO

## 2024-03-01 ENCOUNTER — Ambulatory Visit: Payer: Medicare HMO
